# Patient Record
Sex: Male | Born: 1970
Health system: Southern US, Community
[De-identification: ages and names within clinical notes are randomized; demographics above are authoritative.]

## PROBLEM LIST (undated history)

## (undated) DIAGNOSIS — K219 Gastro-esophageal reflux disease without esophagitis: Secondary | ICD-10-CM

## (undated) DIAGNOSIS — E7800 Pure hypercholesterolemia, unspecified: Secondary | ICD-10-CM

## (undated) DIAGNOSIS — G4730 Sleep apnea, unspecified: Secondary | ICD-10-CM

## (undated) DIAGNOSIS — I509 Heart failure, unspecified: Secondary | ICD-10-CM

## (undated) DIAGNOSIS — E119 Type 2 diabetes mellitus without complications: Secondary | ICD-10-CM

## (undated) DIAGNOSIS — I2510 Atherosclerotic heart disease of native coronary artery without angina pectoris: Secondary | ICD-10-CM

## (undated) DIAGNOSIS — F419 Anxiety disorder, unspecified: Secondary | ICD-10-CM

## (undated) DIAGNOSIS — I10 Essential (primary) hypertension: Secondary | ICD-10-CM

## (undated) DIAGNOSIS — I219 Acute myocardial infarction, unspecified: Secondary | ICD-10-CM

## (undated) DIAGNOSIS — E663 Overweight: Secondary | ICD-10-CM

## (undated) HISTORY — DX: Pure hypercholesterolemia, unspecified: E78.00

## (undated) HISTORY — DX: Overweight: E66.3

## (undated) HISTORY — DX: Essential (primary) hypertension: I10

---

## 2008-06-25 ENCOUNTER — Inpatient Hospital Stay (HOSPITAL_COMMUNITY): Admission: EM | Admit: 2008-06-25 | Discharge: 2008-06-30 | Payer: Self-pay | Admitting: Emergency Medicine

## 2008-07-16 ENCOUNTER — Ambulatory Visit: Payer: Self-pay | Admitting: Family Medicine

## 2008-12-11 ENCOUNTER — Ambulatory Visit: Payer: Self-pay | Admitting: Family Medicine

## 2008-12-15 ENCOUNTER — Ambulatory Visit: Payer: Self-pay | Admitting: *Deleted

## 2009-04-09 ENCOUNTER — Ambulatory Visit: Payer: Self-pay | Admitting: Family Medicine

## 2009-08-10 ENCOUNTER — Ambulatory Visit: Payer: Self-pay | Admitting: Family Medicine

## 2010-04-29 ENCOUNTER — Ambulatory Visit: Payer: Self-pay | Admitting: Family Medicine

## 2010-04-29 ENCOUNTER — Ambulatory Visit (HOSPITAL_COMMUNITY): Admission: RE | Admit: 2010-04-29 | Discharge: 2010-04-29 | Payer: Self-pay | Admitting: Family Medicine

## 2010-04-29 LAB — CONVERTED CEMR LAB: Microalb, Ur: 1.32 mg/dL (ref 0.00–1.89)

## 2010-06-13 ENCOUNTER — Ambulatory Visit: Payer: Self-pay | Admitting: Family Medicine

## 2010-06-13 LAB — CONVERTED CEMR LAB
CO2: 29 meq/L (ref 19–32)
Cholesterol: 199 mg/dL (ref 0–200)
Creatinine, Ser: 1.06 mg/dL (ref 0.40–1.50)
Glucose, Bld: 275 mg/dL — ABNORMAL HIGH (ref 70–99)
Hgb A1c MFr Bld: 11.4 % — ABNORMAL HIGH (ref <5.7)
LDL Cholesterol: 123 mg/dL — ABNORMAL HIGH (ref 0–99)
Potassium: 4.5 meq/L (ref 3.5–5.3)
Sodium: 137 meq/L (ref 135–145)
Triglycerides: 233 mg/dL — ABNORMAL HIGH (ref <150)
VLDL: 47 mg/dL — ABNORMAL HIGH (ref 0–40)

## 2011-03-07 NOTE — Discharge Summary (Signed)
[name], [name] NO.:  [id number]   MEDICAL RECORD NO.:  [id number]          PATIENT TYPE:  INP   LOCATION:  5529                         FACILITY:  [facility]   PHYSICIAN:  Hillery Aldo, M.D.   DATE OF BIRTH:  [birth year]   DATE OF ADMISSION:  06/25/2008  DATE OF DISCHARGE:  06/30/2008                               DISCHARGE SUMMARY   PRIMARY CARE PHYSICIAN:  None.   The patient will be referred to [facility] for hospital followup.   DISCHARGE DIAGNOSES:  1. Methicillin-resistant Staphylococcus aureus, scrotal cellulitis and      abscess, status post incision and drainage.  2. Flu-like illness.  3. Diabetes mellitus.  4. Hypertension.  5. Obesity.  6. Small mildly complicated hydrocele, left testicle.   DISCHARGE MEDICATIONS:  1. Doxycycline 100 mg b.i.d. x10 days.  2. Glipizide 5 mg daily.  3. Glucophage 1000 mg b.i.d.  4. Lisinopril 10 mg daily.   CONSULTATIONS:  Ronald L. Earlene Plater, MD, [facility].   BRIEF ADMISSION HISTORY OF PRESENT ILLNESS:  The patient is a 40-year-  old male who presented to the hospital for evaluation of testicular  swelling on the left, recent cough and fever.  The patient's left  testicular swelling progressed to the point where he developed the  pustule and drained pus and subsequently followed by worsening fevers  and increasing swelling.  He was admitted for further evaluation and  initiation of IV antibiotics.  For the full details, please see the  dictated report done by Dr. Oralia Rud.   PROCEDURES AND DIAGNOSTIC STUDIES:  1. Chest x-ray on June 25, 2008, showed minimal linear scar in the      right upper lobe with no acute cardiopulmonary disease.  2. Scrotal ultrasound on June 25, 2008, showed normal-appearing      testes with no evidence of testicular torsion.  There was a mildly      small complicated hydrocele on the left.  There was associated      thickening of the scrotal walls.  The epididymis was  normal.   DISCHARGE LABORATORY VALUES:  Sodium was 138, potassium 4.3, chloride  102, bicarb 29, BUN 9, creatinine 1.15, glucose 146.  White blood cell  count was 15.2, hemoglobin 13.3, hematocrit 39.8, platelets 250.  Cultures of the pus draining from the scrotum grew MRSA.   HOSPITAL COURSE BY PROBLEM:  1. Left scrotal abscess/cellulitis:  The patient was admitted and      empirically put on broad-spectrum antibiotic therapy including      vancomycin and Zosyn.  Cultures were subsequently taken of the pus      and grew out MRSA.  At that time, his antibiotic coverage was      narrowed to vancomycin.  He has completed 4 days of therapy with IV      vancomycin and has experienced dramatic decrease in the pain,      swelling, and induration of the abscessed area.  He was seen and      followed in-house by the urologist with the initial incision and      drainage and followed  him clinically during course of his hospital      stay.  At this point, the sensitivities do reveal that the MRSA is      sensitive to doxycycline and Septra.  Because of cost constraints,      we are going to discharge him on doxycycline for an additional 10      days of therapy.  We will set him up to be seen at [facility] for      hospital followup and recommend antibacterial showers with Dial or      Lever 2000.  2. Flu-like illness:  The patient may have had H1N1 influenza prior to      admission.  He did have nasal swabs sent off for PCR for H1N1 that      are pending at the time of this dictation.  Because of the length      of time that he experienced these symptoms, it was not felt that      Tamiflu would provide any benefit.  3. Diabetes:  The patient had not been treating this, but had been on      metformin in the past.  Hemoglobin A1c was found to be 8.3%.      Subsequently started on metformin and the dose titrated up along      with Glucotrol, which was added due to inadequate glycemic control.       At this time, on dual therapy, his sugars have ranged from 91-130      over the last 24 hours indicating much better control.  We will      discharge him on this combination medications.  4. Transient diarrhea:  The patient did develop some transient      diarrhea.  C. difficile toxin studies were negative times 1.  He      was put on Florastor and at this time, his diarrhea has improved.   DISPOSITION:  The patient is medically stable for discharge and would be  discharged home.  He does not have insurance or local physician.  We  will obtain a care manager consultation for referral to [facility] for  hospital followup and for help with obtaining his prescription  medications.   TIME SPENT ON DISCHARGE PLANNING AND COORDINATION OF CARE:  Greater than  30 minutes.      Hillery Aldo, M.D.  Electronically Signed     CR/MEDQ  D:  06/30/2008  T:  06/30/2008  Job:  [id number]   cc:   Melvern Banker

## 2011-03-07 NOTE — H&P (Signed)
[name] Merritt, [name] Merritt NO.:  [id number]   MEDICAL RECORD NO.:  [id number]          PATIENT TYPE:  INP   LOCATION:  5529                         FACILITY:  [facility]   PHYSICIAN:  Darryl D. Prime, MD    DATE OF BIRTH:  1970/11/21   DATE OF ADMISSION:  06/25/2008  DATE OF DISCHARGE:                              HISTORY & PHYSICAL   The patient is full code.  He was the historian.  He is a good  historian.  He has no primary care physician.  The patient has recently  moved from [place], [place], within the last 6 months.   The patient's total visit time was approximately 62 minutes.   CHIEF COMPLAINT:  Testicle swollen, recent cough and fever.   HISTORY OF PRESENT ILLNESS:  [name] Merritt is a 40 year old male with a  history of diabetes, he notes diagnosed over the last year, was placed  on metformin but has not taken metformin for the last 6 months since  moving here, who notes initially a cough, nasal congestion, and chest  congestion for the last 3 weeks.  Cough has been dry but denies any  associated fevers but did feel hot at times.  The patient notes over the  last 2 days he had subjective fevers, sweats, and increased heart rate  with cold chills and then noticed a pimple around that time in the area  of the left scrotal skin.  On further recollection, the patient notes  the pimple actually has been present for the last 3 days but increased  in size over the last 2 days.  He notes yesterday his mom placed  fatback on it and it drained pus.  He stated he then developed  significant fevers, increased in swelling, and scrotal-area-associated  pain and decided to present to the emergency room.  In the emergency  room, the patient was evaluated by urologist and had an ultrasound of  the scrotal area.  He was also ordered for vancomycin and morphine.   PAST MEDICAL HISTORY AND PAST SURGICAL HISTORY:  1. He is status post vasectomy in 2001.  2. History of diabetes  since August of 2008.  3. History of hypertension.  4. No surgeries other than the vasectomy.   SOCIAL HISTORY:  No tobacco, alcohol, or illicit drug use ever.  He is  currently unemployed.   FAMILY HISTORY:  Positive for father who had diabetes.   ALLERGIES:  No known drug allergies.   MEDICATIONS:  He is on none at this time but was 6 months ago on  metformin and a blood pressure medication.   REVIEW OF SYSTEMS:  A 14-point review of systems negative unless stated  above.   PHYSICAL EXAMINATION:  VITAL SIGNS:  Temperature is 103.1 with a blood  pressure of 152/87, pulse of 118, respiratory rate of 24, and sats are  94% on room air.  GENERAL:  He is an extremely muscular and obese male lying flat in bed  in no acute distress.  He is on droplet precautions with a mask in  place.  HEENT:  Normocephalic and atraumatic.  His pupils are equal, round, and  reactive to light.  Extraocular movements are intact.  The oropharynx is  dry with no oropharyngeal lesions.  NECK:  Supple with no lymphadenopathy or thyromegaly.  No carotid  bruits.  No jugular venous distention.  CARDIOVASCULAR:  Regular rhythm with a fast rate, hyperdynamic.  No  murmurs.  EXTREMITIES:  No clubbing, cyanosis, or edema.  ABDOMEN:  Obese, soft, nontender, and nondistended with no  hepatosplenomegaly.  LUNGS:  Clear to auscultation bilaterally.  GU:  Scrotal area disclosed associated mild induration, redness, and  tenderness in the area of the left scrotum, the size of the erythema  over the skin in the range of 7 x 3 cm, although the borders are  diffuse.  There is no current drainage at this time.   DATA:  The patient had an ultrasound of the scrotum, which shows both  testes normal in size with normal blood flow.  No abnormal findings to  the epididymides as well, but he does have a left-sided hydrocele that  is complicated.  There is no definite scrotal abscess.   The patient's white blood cell count is  13.7 with a hemoglobin of 14.7,  platelets 270, and segs of 35%.  Sodium of 139 with a potassium of 3.9,  chloride 102, bicarb 26, BUN 13 with a creatinine of 1.5, and glucose  158.  No prior lab studies to compare.  Chest x-ray showed no acute  coronary pulmonary disease.   ASSESSMENT AND PLAN:  This is a 40 year old male with diabetes that is  not controlled because he is not on any medication who presents with a  scrotal pimple that has drained.  He has had associated fever.  He has  had recent flu-like symptoms, but it is less likely to be H1N1 as he did  not have associated fevers until 2 days ago, which seems to be more  associated with the scrotal pimple as opposed to any other findings or  history.  The patient is a diabetic, and that area is concerning for  staphylococcus infection, especially methicillin-resistant  Staphylococcus aureus.  The patient's white blood cell count is  elevated.  He has a high fever.   He will be admitted and we will continue vancomycin and Zosyn, and we  will get blood cultures x2.  Urology did see him and felt there was no  scrotal abscess, only a skin abscess associated with the scrotal skin  only, and they will follow him.  We will get a urinalysis.  He will be  given IV fluids for associated hypovolemia, and he will be given warm  compresses over that area.  For diabetes history, we will check a  hemoglobin A1c, and he is to be assigned a primary care physician on  discharge if possible.  He may benefit from being restarted on  metformin.  For his elevated creatinine, we will give IV fluids and  follow.  For his hypertension, we will give IV fluids and follow his  creatinine, and once it is stable we will possibly restart an  antihypertensive upon discharge.  GI prophylaxis will be Zantac, DVT  prophylaxis with enoxaparin.      Darryl D. Prime, MD  Electronically Signed     DDP/MEDQ  D:  06/25/2008  T:  06/26/2008  Job:  [id number]

## 2011-07-26 LAB — H1N1 SCREEN (PCR): H1N1 Virus Scrn: NOT DETECTED

## 2011-07-26 LAB — DIFFERENTIAL
Basophils Absolute: 0.1
Basophils Relative: 1
Eosinophils Absolute: 0
Eosinophils Relative: 0
Monocytes Absolute: 1.2 — ABNORMAL HIGH
Monocytes Relative: 9

## 2011-07-26 LAB — CBC
HCT: 36.7 — ABNORMAL LOW
HCT: 39.8
HCT: 44
Hemoglobin: 12.4 — ABNORMAL LOW
Hemoglobin: 13.3
MCHC: 33.3
MCHC: 33.4
MCV: 97
MCV: 97.4
MCV: 98
Platelets: 218
Platelets: 280
RBC: 3.81 — ABNORMAL LOW
RBC: 4.52
RDW: 13
WBC: 10.9 — ABNORMAL HIGH
WBC: 13.7 — ABNORMAL HIGH

## 2011-07-26 LAB — GLUCOSE, CAPILLARY
Glucose-Capillary: 119 — ABNORMAL HIGH
Glucose-Capillary: 130 — ABNORMAL HIGH
Glucose-Capillary: 133 — ABNORMAL HIGH
Glucose-Capillary: 140 — ABNORMAL HIGH
Glucose-Capillary: 156 — ABNORMAL HIGH
Glucose-Capillary: 162 — ABNORMAL HIGH
Glucose-Capillary: 169 — ABNORMAL HIGH
Glucose-Capillary: 169 — ABNORMAL HIGH
Glucose-Capillary: 177 — ABNORMAL HIGH
Glucose-Capillary: 184 — ABNORMAL HIGH
Glucose-Capillary: 99

## 2011-07-26 LAB — BASIC METABOLIC PANEL
BUN: 9
BUN: 9
CO2: 27
CO2: 28
Calcium: 9.1
Chloride: 102
Chloride: 104
Chloride: 105
Creatinine, Ser: 1.06
Creatinine, Ser: 1.15
GFR calc Af Amer: >60
GFR calc non Af Amer: >60
GFR calc non Af Amer: >60
Glucose, Bld: 146 — ABNORMAL HIGH
Glucose, Bld: 99
Potassium: 3.7
Potassium: 3.9
Potassium: 4.3
Sodium: 138

## 2011-07-26 LAB — POCT I-STAT, CHEM 8
BUN: 13
Glucose, Bld: 158 — ABNORMAL HIGH
HCT: 45
Hemoglobin: 15.3
Potassium: 3.9

## 2011-07-26 LAB — CULTURE, ROUTINE-ABSCESS

## 2011-07-26 LAB — URINALYSIS, ROUTINE W REFLEX MICROSCOPIC
Nitrite: NEGATIVE
Specific Gravity, Urine: 1.03
Urobilinogen, UA: 1
pH: 6

## 2011-07-26 LAB — CULTURE, BLOOD (ROUTINE X 2)
Culture: NO GROWTH
Culture: NO GROWTH

## 2011-07-26 LAB — HEMOGLOBIN A1C
Hgb A1c MFr Bld: 8.3 — ABNORMAL HIGH
Mean Plasma Glucose: 192

## 2011-07-26 LAB — CLOSTRIDIUM DIFFICILE EIA
C difficile Toxins A+B, EIA: NEGATIVE
C difficile Toxins A+B, EIA: NEGATIVE

## 2013-09-25 ENCOUNTER — Encounter (HOSPITAL_COMMUNITY): Payer: Self-pay | Admitting: Emergency Medicine

## 2013-09-25 ENCOUNTER — Emergency Department (HOSPITAL_COMMUNITY)
Admission: EM | Admit: 2013-09-25 | Discharge: 2013-09-25 | Disposition: A | Discharge status: Home / Self Care | Payer: Self-pay | Attending: Emergency Medicine | Admitting: Emergency Medicine

## 2013-09-25 DIAGNOSIS — K5900 Constipation, unspecified: Secondary | ICD-10-CM | POA: Insufficient documentation

## 2013-09-25 DIAGNOSIS — R739 Hyperglycemia, unspecified: Secondary | ICD-10-CM

## 2013-09-25 DIAGNOSIS — R631 Polydipsia: Secondary | ICD-10-CM | POA: Insufficient documentation

## 2013-09-25 DIAGNOSIS — E119 Type 2 diabetes mellitus without complications: Secondary | ICD-10-CM | POA: Insufficient documentation

## 2013-09-25 DIAGNOSIS — K6289 Other specified diseases of anus and rectum: Secondary | ICD-10-CM | POA: Insufficient documentation

## 2013-09-25 HISTORY — DX: Type 2 diabetes mellitus without complications: E11.9

## 2013-09-25 LAB — CBC
Hemoglobin: 15.7 g/dL (ref 13.0–17.0)
MCH: 32 pg (ref 26.0–34.0)
MCHC: 34.7 g/dL (ref 30.0–36.0)
MCV: 92.3 fL (ref 78.0–100.0)
RBC: 4.91 MIL/uL (ref 4.22–5.81)

## 2013-09-25 LAB — BASIC METABOLIC PANEL
BUN: 13 mg/dL (ref 6–23)
CO2: 25 mEq/L (ref 19–32)
Calcium: 9 mg/dL (ref 8.4–10.5)
Glucose, Bld: 555 mg/dL (ref 70–99)
Potassium: 4.3 mEq/L (ref 3.5–5.1)
Sodium: 133 mEq/L — ABNORMAL LOW (ref 135–145)

## 2013-09-25 LAB — GLUCOSE, CAPILLARY: Glucose-Capillary: 359 mg/dL — ABNORMAL HIGH (ref 70–99)

## 2013-09-25 LAB — URINALYSIS, ROUTINE W REFLEX MICROSCOPIC
Bilirubin Urine: NEGATIVE
Glucose, UA: >1000 mg/dL — AB
Hgb urine dipstick: NEGATIVE
Protein, ur: NEGATIVE mg/dL

## 2013-09-25 MED ORDER — SODIUM CHLORIDE 0.9 % IV BOLUS (SEPSIS)
1000.0000 mL | Freq: Once | INTRAVENOUS | Status: AC
  Administered 2013-09-25: 1000 mL via INTRAVENOUS

## 2013-09-25 MED ORDER — HYDROCODONE-ACETAMINOPHEN 5-325 MG PO TABS: ORAL_TABLET | ORAL | Status: DC

## 2013-09-25 MED ORDER — OXYCODONE-ACETAMINOPHEN 5-325 MG PO TABS
1.0000 | ORAL_TABLET | Freq: Once | ORAL | Status: AC
  Administered 2013-09-25: 1 via ORAL
  Filled 2013-09-25: qty 1

## 2013-09-25 MED ORDER — HYDROCORTISONE 2.5 % RE CREA: TOPICAL_CREAM | RECTAL | Status: DC

## 2013-09-25 MED ORDER — DOCUSATE SODIUM 100 MG PO CAPS: 100.0000 mg | ORAL_CAPSULE | Freq: Two times a day (BID) | ORAL | Status: DC

## 2013-09-25 MED ORDER — METFORMIN HCL 500 MG PO TABS: 500.0000 mg | ORAL_TABLET | Freq: Two times a day (BID) | ORAL | Status: DC

## 2013-09-25 NOTE — ED Notes (Signed)
Pt given d/c instructions and verbalized understanding. NAD at this time. VS are stable at this time.

## 2013-09-25 NOTE — ED Provider Notes (Signed)
Medical screening examination/treatment/procedure(s) were performed by non-physician practitioner and as supervising physician I was immediately available for consultation/collaboration.   Mireya Meditz, MD 09/25/13 2258 

## 2013-09-25 NOTE — ED Provider Notes (Signed)
CSN: [id number]     Arrival date & time 09/25/13  4098 History   First MD Initiated Contact with Patient 09/25/13 0310     Chief Complaint  Patient presents with  . Hemorrhoids   (Consider location/radiation/quality/duration/timing/severity/associated sxs/prior Treatment) HPI Comments: Patient with history of diabetes presents with complaint of rectal pain for the past one and one half weeks. Patient self diagnosed hemorrhoids. He is having pain with defecation, has noted a small amount of blood in stool. Patient states that he has not taken metformin in one year [facility]. He reports increased thirst and urination. No abdominal pain, vomiting, fever. No chills, dysuria. Patient has used over-the-counter preparation H., Tuff pads, sitz baths without much relief. He reports straining often times with bowel movements. The onset of this condition was acute. The course is constant.  The history is provided by the patient.    Past Medical History  Diagnosis Date  . Diabetes mellitus without complication    History reviewed. No pertinent past surgical history. No family history on file. History  Substance Use Topics  . Smoking status: Never Smoker   . Smokeless tobacco: Not on file  . Alcohol Use: No    Review of Systems  Constitutional: Negative for fever.  HENT: Negative for rhinorrhea and sore throat.   Eyes: Negative for redness.  Respiratory: Negative for cough.   Cardiovascular: Negative for chest pain.  Gastrointestinal: Positive for constipation, blood in stool and rectal pain. Negative for nausea, vomiting, abdominal pain and diarrhea.  Endocrine: Positive for polydipsia.  Genitourinary: Negative for dysuria and hematuria.  Musculoskeletal: Negative for myalgias.  Skin: Negative for rash.  Neurological: Negative for headaches.    Allergies  Review of patient's allergies indicates no known allergies.  Home Medications   Current Outpatient Rx  Name   Route  Sig  Dispense  Refill  . naproxen sodium (ANAPROX) 220 MG tablet   Oral   Take 660 mg by mouth daily as needed (for pain).          BP 179/107  Pulse 108  Temp(Src) 98.6 F (37 C) (Oral)  Resp 20  SpO2 96% Physical Exam  Nursing note and vitals reviewed. Constitutional: He appears well-developed and well-nourished.  HENT:  Head: Normocephalic and atraumatic.  Eyes: Conjunctivae are normal. Right eye exhibits no discharge. Left eye exhibits no discharge.  Neck: Normal range of motion. Neck supple.  Cardiovascular: Normal rate, regular rhythm and normal heart sounds.   Pulmonary/Chest: Effort normal and breath sounds normal.  Abdominal: Soft. There is no tenderness.  Genitourinary: Rectal exam shows external hemorrhoid (6 o'clock, pea sized) and tenderness (generalized). Rectal exam shows no internal hemorrhoid, no fissure, no mass and anal tone normal.  Neurological: He is alert.  Skin: Skin is warm and dry.  Psychiatric: He has a normal mood and affect.    ED Course  Procedures (including critical care time) Labs Review Labs Reviewed  GLUCOSE, CAPILLARY - Abnormal; Notable for the following:    Glucose-Capillary 489 (*)    All other components within normal limits  URINALYSIS, ROUTINE W REFLEX MICROSCOPIC - Abnormal; Notable for the following:    Glucose, UA >1000 (*)    All other components within normal limits  BASIC METABOLIC PANEL - Abnormal; Notable for the following:    Sodium 133 (*)    Glucose, Bld 555 (*)    All other components within normal limits  GLUCOSE, CAPILLARY - Abnormal; Notable for the following:  Glucose-Capillary 359 (*)    All other components within normal limits  CBC  URINE MICROSCOPIC-ADD ON   Imaging Review No results found.  EKG Interpretation   None      3:20 AM Patient seen and examined. Work-up initiated. Medications ordered. Rectal exam performed. Pt informed of elevated blood pressure.   Vital signs reviewed and are  as follows: Filed Vitals:   09/25/13 0237  BP: 179/107  Pulse: 108  Temp: 98.6 F (37 C)  Resp: 20   4:27 AM Hyperglycemia improved with fluids.   Patient will be discharged to home.   Patient urged to return with worsening symptoms or other concerns. Patient verbalized understanding and agrees with plan.   Patient counseled on use of narcotic pain medications. Counseled not to combine these medications with others containing tylenol. Urged not to drink alcohol, drive, or perform any other activities that requires focus while taking these medications. The patient verbalizes understanding and agrees with the plan.  BP 144/81  Pulse 72  Temp(Src) 98.6 F (37 C) (Oral)  Resp 16  SpO2 95%  MDM   1. Rectal pain   2. Hyperglycemia without ketosis    Rectal pain: Suspect hemorrhoid, fissure not identified on exam is possible given amount of tenderness. No systemic symptoms or signs of abscess or proctitis. He is uncontrolled diabetic. Will start on colace, anusol, continue sitz baths. Small amount of pain medicine given for severe symptoms -- patient knows this can cause constipation and is to continue colace to prevent this. PCP f/u given with [facility].   Hyperglycemia: Non-compliant with metformin x 1 year. No ketosis tonight. Treated with fluids. Will have patient restart metformin. Urged importance of this. Urged importance of f/u with [facility].   HTN: No end organ damage, f/u with [facility] for management. Part of HTN tonight may be pain, but he is likely to need restarted on HTN meds.     Renne Crigler, PA-C 09/25/13 [phone]

## 2013-09-25 NOTE — ED Notes (Signed)
Preston Fleeting, MD is aware of the pt's glucose level.

## 2013-09-25 NOTE — ED Notes (Signed)
C/o hemorrhoid pain x 1 1/2 weeks.  Pt states he is a diabetic but unable to get diabetes medication since clinic closed 1 yr ago.

## 2013-09-25 NOTE — ED Notes (Signed)
Pt believes that he has hemorrhoids, based on internet searches. Pt states that he has been having burning, itching, and spotting of blood from his rectum x 1 month. Pt states that he has tried over the counter hemorrhoid relief products with no relief.

## 2013-09-25 NOTE — ED Notes (Signed)
Diabetic pt without Metformin for over one year due to inability to afford and get prescribed. Denies headaches and blurred vision, denies dizziness.

## 2013-09-30 ENCOUNTER — Ambulatory Visit: Payer: Self-pay

## 2013-10-30 ENCOUNTER — Ambulatory Visit: Payer: Self-pay | Admitting: Internal Medicine

## 2015-09-17 ENCOUNTER — Emergency Department (HOSPITAL_COMMUNITY)
Admission: EM | Admit: 2015-09-17 | Discharge: 2015-09-17 | Disposition: A | Discharge status: Home / Self Care | Payer: No Typology Code available for payment source | Attending: Emergency Medicine | Admitting: Emergency Medicine

## 2015-09-17 ENCOUNTER — Encounter (HOSPITAL_COMMUNITY): Payer: Self-pay | Admitting: Emergency Medicine

## 2015-09-17 ENCOUNTER — Emergency Department (HOSPITAL_COMMUNITY): Payer: No Typology Code available for payment source

## 2015-09-17 DIAGNOSIS — M25521 Pain in right elbow: Secondary | ICD-10-CM | POA: Diagnosis present

## 2015-09-17 DIAGNOSIS — Z7952 Long term (current) use of systemic steroids: Secondary | ICD-10-CM | POA: Insufficient documentation

## 2015-09-17 DIAGNOSIS — Z79899 Other long term (current) drug therapy: Secondary | ICD-10-CM | POA: Diagnosis not present

## 2015-09-17 DIAGNOSIS — M7021 Olecranon bursitis, right elbow: Secondary | ICD-10-CM | POA: Insufficient documentation

## 2015-09-17 DIAGNOSIS — Y9389 Activity, other specified: Secondary | ICD-10-CM | POA: Diagnosis not present

## 2015-09-17 DIAGNOSIS — E119 Type 2 diabetes mellitus without complications: Secondary | ICD-10-CM | POA: Diagnosis not present

## 2015-09-17 DIAGNOSIS — Z7984 Long term (current) use of oral hypoglycemic drugs: Secondary | ICD-10-CM | POA: Diagnosis not present

## 2015-09-17 MED ORDER — CEPHALEXIN 250 MG PO CAPS
500.0000 mg | ORAL_CAPSULE | Freq: Once | ORAL | Status: AC
  Administered 2015-09-17: 500 mg via ORAL
  Filled 2015-09-17: qty 2

## 2015-09-17 MED ORDER — OXYCODONE-ACETAMINOPHEN 5-325 MG PO TABS
2.0000 | ORAL_TABLET | Freq: Once | ORAL | Status: AC
  Administered 2015-09-17: 2 via ORAL
  Filled 2015-09-17: qty 2

## 2015-09-17 MED ORDER — OXYCODONE-ACETAMINOPHEN 5-325 MG PO TABS: 1.0000 | ORAL_TABLET | Freq: Four times a day (QID) | ORAL | Status: DC | PRN

## 2015-09-17 MED ORDER — CEPHALEXIN 500 MG PO CAPS: 500.0000 mg | ORAL_CAPSULE | Freq: Four times a day (QID) | ORAL | Status: DC

## 2015-09-17 NOTE — ED Provider Notes (Signed)
CSN: [id number]     Arrival date & time 09/17/15  0359 History   First MD Initiated Contact with Patient 09/17/15 0431     Chief Complaint  Patient presents with  . Elbow Pain     (Consider location/radiation/quality/duration/timing/severity/associated sxs/prior Treatment) HPI Comments: 44 year old male with a history of diabetes mellitus presents to the emergency department for evaluation of right elbow pain. Patient states the pain began at 2300 yesterday. It has been constant and gradually worsening. No treatments tried prior to arrival. Pain is aggravated with movement of the right elbow. Patient denies any direct injury or trauma. He has no history of gout and denies frequent consumption of red meat or beer. Patient has had no fever or chills. No extremity numbness or weakness. He further denies nausea and vomiting. No history of similar symptoms.  Patient is a 44 y.o. male presenting with extremity pain. The history is provided by the patient. No language interpreter was used.  Extremity Pain This is a new problem. The current episode started today. The problem occurs constantly. The problem has been gradually worsening. Associated symptoms include arthralgias and joint swelling. Pertinent negatives include no fever, numbness or weakness. Exacerbated by: movement of the RUE/elbow. He has tried nothing for the symptoms. The treatment provided no relief.    Past Medical History  Diagnosis Date  . Diabetes mellitus without complication (HCC)    History reviewed. No pertinent past surgical history. No family history on file. Social History  Substance Use Topics  . Smoking status: Never Smoker   . Smokeless tobacco: None  . Alcohol Use: No    Review of Systems  Constitutional: Negative for fever.  Musculoskeletal: Positive for joint swelling and arthralgias.  Skin: Positive for color change. Negative for wound.  Neurological: Negative for weakness and numbness.  All other systems  reviewed and are negative.   Allergies  Review of patient's allergies indicates no known allergies.  Home Medications   Prior to Admission medications   Medication Sig Start Date End Date Taking? Authorizing Provider  docusate sodium (COLACE) 100 MG capsule Take 1 capsule (100 mg total) by mouth every 12 (twelve) hours. 09/25/13   Renne Crigler, PA-C  HYDROcodone-acetaminophen (NORCO/VICODIN) 5-325 MG per tablet Take 1-2 tablets every 6 hours as needed for severe pain 09/25/13   Renne Crigler, PA-C  hydrocortisone (ANUSOL-HC) 2.5 % rectal cream Apply rectally 2 times daily 09/25/13   Renne Crigler, PA-C  metFORMIN (GLUCOPHAGE) 500 MG tablet Take 1 tablet (500 mg total) by mouth 2 (two) times daily with a meal. 09/25/13   Renne Crigler, PA-C  naproxen sodium (ANAPROX) 220 MG tablet Take 660 mg by mouth daily as needed (for pain).    Historical Provider, MD   BP 161/104 mmHg  Pulse 93  Temp(Src) 98.6 F (37 C) (Oral)  Resp 18  Ht 6\' 2"  (1.88 m)  Wt 138.347 kg  BMI 39.14 kg/m2  SpO2 94%   Physical Exam  Constitutional: He is oriented to person, place, and time. He appears well-developed and well-nourished. No distress.  Nontoxic/nonseptic appearing  HENT:  Head: Normocephalic and atraumatic.  Eyes: Conjunctivae and EOM are normal. No scleral icterus.  Neck: Normal range of motion.  Cardiovascular: Normal rate, regular rhythm and intact distal pulses.   Distal radial pulse 2+ in the RUE  Pulmonary/Chest: Effort normal. No respiratory distress.  Respirations even and unlabored  Musculoskeletal: Normal range of motion.       Right elbow: He exhibits swelling (mild, over  olecranon). He exhibits normal range of motion (full AROM and PROM despite some pain with ROM.) and no effusion. Tenderness found. Olecranon process tenderness noted.       Right upper arm: Normal.       Right forearm: Normal.  Neurological: He is alert and oriented to person, place, and time. He exhibits normal muscle  tone. Coordination normal.  Sensation to light touch intact in the RUE. Grip strength 5/5 in the R hand.  Skin: Skin is warm and dry. No rash noted. He is not diaphoretic. No erythema. No pallor.  Psychiatric: He has a normal mood and affect. His behavior is normal.  Nursing note and vitals reviewed.   ED Course  Procedures (including critical care time) Labs Review Labs Reviewed - No data to display  Imaging Review Dg Elbow 2 Views Right  09/17/2015  CLINICAL DATA:  Acute onset of right elbow pain, swelling and erythema. Initial encounter. EXAM: RIGHT ELBOW - 2 VIEW COMPARISON:  None. FINDINGS: There is no evidence of fracture or dislocation. The visualized joint spaces are preserved. No significant joint effusion is identified. The soft tissues are unremarkable in appearance. IMPRESSION: No evidence of fracture or dislocation. Electronically Signed   By: Roanna Raider M.D.   On: 09/17/2015 04:51   I have personally reviewed and evaluated these images and lab results as part of my medical decision-making.   EKG Interpretation None      MDM   Final diagnoses:  Olecranon bursitis of right elbow    44 year old male presents to the emergency department for further evaluation of right elbow pain. Symptoms consistent with olecranon bursitis, mild. Patient is afebrile. He does have normal range of motion of his right upper extremity, though movement aggravates his pain. X-ray negative for acute findings. Patient started on Keflex in the emergency department. Will discharge with Keflex and Percocet for symptom management. Sling given for comfort and orthopedic referral provided. Patient instructed to follow-up with his primary care doctor in 3 days for recheck, with return to the ED if symptoms worsen. Return precautions given at discharge. Patient agreeable to plan with no unaddressed concerns. Patient discharged in good condition; VSS.   Filed Vitals:   09/17/15 0410 09/17/15 0415  BP:  170/105 161/104  Pulse: 93 93  Temp: 98.6 F (37 C)   TempSrc: Oral   Resp: 18   Height:  (1.88 m)   Weight: 138.347 kg   SpO2: 94% 94%     Antony Madura, PA-C 09/17/15 0543  Dione Booze, MD 09/17/15 [phone]

## 2015-09-17 NOTE — ED Notes (Signed)
Pt arrives POV c/o R elbow pain right at joint starting at 2300 yesterday. Denies injury.

## 2015-09-17 NOTE — Discharge Instructions (Signed)
Take ibuprofen or aleve for pain control and Percocet for severe pain as needed. Take Keflex for coverage of an infection. You may also try icing. Follow up with your primary doctor on Monday and an orthopedist as needed, especially if symptoms persist.  Elbow Bursitis Elbow bursitis is inflammation of the fluid-filled sac (bursa) between the tip of your elbow bone (olecranon) and your skin. Elbow bursitis may also be called olecranon bursitis. Normally, the olecranon bursa has only a small amount of fluid in it to cushion and protect your elbow bone. Elbow bursitis causes fluid to build up inside the bursa. Over time, this swelling and inflammation can cause pain when you bend or lean on your elbow.  CAUSES Elbow bursitis may be caused by:   Elbow injury (acute trauma).  Leaning on hard surfaces for long periods of time.  Infection from an injury that breaks the skin near your elbow.  A bone growth (spur) that forms at the tip of your elbow.  A medical condition that causes inflammation in your body, such as gout or rheumatoid arthritis.  The cause may also be unknown.  SIGNS AND SYMPTOMS  The first sign of elbow bursitis is usually swelling over the tip of your elbow. This can grow to be the size of a golf ball. This may start suddenly or develop gradually. You may also have:  Pain when bending or leaning on your elbow.  Restricted movement of your elbow.  If your bursitis is caused by an infection, symptoms may also include:  Redness, warmth, and tenderness of the elbow.  Drainage of pus from the swollen area over your elbow, if the skin breaks open. DIAGNOSIS  Your health care provider may be able to diagnose elbow bursitis based on your signs and symptoms, especially if you have recently been injured. Your health care provider will also do a physical exam. This may include:  X-rays to look for a bone spur or a bone fracture.  Draining fluid from the bursa to test it for  infection.  Blood tests to rule out gout or rheumatoid arthritis. TREATMENT  Treatment for elbow bursitis depends on the cause. Treatment may include:  Medicines. These may include:  Over-the-counter medicines to relieve pain and inflammation.  Antibiotic medicines to fight infection.  Injections of anti-inflammatory medicines (steroids).  Wrapping your elbow with a bandage.  Draining fluid from the bursa.  Wearing elbow pads.  If your bursitis does not get better with treatment, surgery may be needed to remove the bursa.  HOME CARE INSTRUCTIONS   Take medicines only as directed by your health care provider.  If you were prescribed an antibiotic medicine, finish all of it even if you start to feel better.  If your bursitis is caused by an injury, rest your elbow and wear your bandage as directed by your health care provider. You may alsoapply ice to the injured area as directed by your health care provider:  Put ice in a plastic bag.  Place a towel between your skin and the bag.  Leave the ice on for 20 minutes, 2-3 times per day.  Avoid any activities that cause elbow pain.  Use elbow pads or elbow wraps to cushion your elbow. SEEK MEDICAL CARE IF:  You have a fever.   Your symptoms do not get better with treatment.  Your pain or swelling gets worse.  Your elbow pain or swelling goes away and then returns.  You have drainage of pus from the swollen  area over your elbow.   This information is not intended to replace advice given to you by your health care provider. Make sure you discuss any questions you have with your health care provider.   Document Released: 11/08/2006 Document Revised: 10/30/2014 Document Reviewed: 06/17/2014 Elsevier Interactive Patient Education [employer].

## 2016-02-08 DIAGNOSIS — I10 Essential (primary) hypertension: Secondary | ICD-10-CM | POA: Diagnosis not present

## 2016-02-08 DIAGNOSIS — R5383 Other fatigue: Secondary | ICD-10-CM | POA: Diagnosis not present

## 2016-02-08 DIAGNOSIS — E7800 Pure hypercholesterolemia, unspecified: Secondary | ICD-10-CM | POA: Diagnosis not present

## 2016-02-08 DIAGNOSIS — E1165 Type 2 diabetes mellitus with hyperglycemia: Secondary | ICD-10-CM | POA: Diagnosis not present

## 2016-10-23 HISTORY — PX: OTHER SURGICAL HISTORY: SHX169

## 2016-10-26 DIAGNOSIS — Z23 Encounter for immunization: Secondary | ICD-10-CM | POA: Diagnosis not present

## 2016-10-26 DIAGNOSIS — E1165 Type 2 diabetes mellitus with hyperglycemia: Secondary | ICD-10-CM | POA: Diagnosis not present

## 2016-10-26 DIAGNOSIS — I10 Essential (primary) hypertension: Secondary | ICD-10-CM | POA: Diagnosis not present

## 2016-10-26 DIAGNOSIS — E7800 Pure hypercholesterolemia, unspecified: Secondary | ICD-10-CM | POA: Diagnosis not present

## 2016-10-26 DIAGNOSIS — Z0000 Encounter for general adult medical examination without abnormal findings: Secondary | ICD-10-CM | POA: Diagnosis not present

## 2017-01-04 DIAGNOSIS — Z113 Encounter for screening for infections with a predominantly sexual mode of transmission: Secondary | ICD-10-CM | POA: Diagnosis not present

## 2017-01-04 DIAGNOSIS — M25512 Pain in left shoulder: Secondary | ICD-10-CM | POA: Diagnosis not present

## 2017-01-05 ENCOUNTER — Other Ambulatory Visit: Payer: Self-pay | Admitting: Sports Medicine

## 2017-01-05 DIAGNOSIS — M25512 Pain in left shoulder: Secondary | ICD-10-CM

## 2017-01-05 DIAGNOSIS — M7582 Other shoulder lesions, left shoulder: Secondary | ICD-10-CM | POA: Diagnosis not present

## 2017-01-05 DIAGNOSIS — M542 Cervicalgia: Secondary | ICD-10-CM | POA: Diagnosis not present

## 2017-01-14 ENCOUNTER — Other Ambulatory Visit: Payer: No Typology Code available for payment source

## 2017-01-16 ENCOUNTER — Ambulatory Visit
Admission: RE | Admit: 2017-01-16 | Discharge: 2017-01-16 | Disposition: A | Discharge status: Home / Self Care | Payer: BLUE CROSS/BLUE SHIELD | Source: Ambulatory Visit | Attending: Sports Medicine | Admitting: Sports Medicine

## 2017-01-16 DIAGNOSIS — M25512 Pain in left shoulder: Secondary | ICD-10-CM

## 2017-01-16 DIAGNOSIS — M25511 Pain in right shoulder: Secondary | ICD-10-CM | POA: Diagnosis not present

## 2017-01-18 DIAGNOSIS — M7582 Other shoulder lesions, left shoulder: Secondary | ICD-10-CM | POA: Diagnosis not present

## 2017-01-22 DIAGNOSIS — M7582 Other shoulder lesions, left shoulder: Secondary | ICD-10-CM | POA: Diagnosis not present

## 2017-02-15 DIAGNOSIS — G8918 Other acute postprocedural pain: Secondary | ICD-10-CM | POA: Diagnosis not present

## 2017-02-15 DIAGNOSIS — M7542 Impingement syndrome of left shoulder: Secondary | ICD-10-CM | POA: Diagnosis not present

## 2017-02-15 DIAGNOSIS — M75122 Complete rotator cuff tear or rupture of left shoulder, not specified as traumatic: Secondary | ICD-10-CM | POA: Diagnosis not present

## 2017-02-15 DIAGNOSIS — M24112 Other articular cartilage disorders, left shoulder: Secondary | ICD-10-CM | POA: Diagnosis not present

## 2017-02-15 DIAGNOSIS — S46012A Strain of muscle(s) and tendon(s) of the rotator cuff of left shoulder, initial encounter: Secondary | ICD-10-CM | POA: Diagnosis not present

## 2017-03-02 DIAGNOSIS — M24112 Other articular cartilage disorders, left shoulder: Secondary | ICD-10-CM | POA: Diagnosis not present

## 2017-03-02 DIAGNOSIS — M7542 Impingement syndrome of left shoulder: Secondary | ICD-10-CM | POA: Diagnosis not present

## 2017-04-02 DIAGNOSIS — S46012D Strain of muscle(s) and tendon(s) of the rotator cuff of left shoulder, subsequent encounter: Secondary | ICD-10-CM | POA: Diagnosis not present

## 2017-04-04 DIAGNOSIS — M25612 Stiffness of left shoulder, not elsewhere classified: Secondary | ICD-10-CM | POA: Diagnosis not present

## 2017-04-04 DIAGNOSIS — M6281 Muscle weakness (generalized): Secondary | ICD-10-CM | POA: Diagnosis not present

## 2017-04-04 DIAGNOSIS — M25512 Pain in left shoulder: Secondary | ICD-10-CM | POA: Diagnosis not present

## 2017-04-04 DIAGNOSIS — M75122 Complete rotator cuff tear or rupture of left shoulder, not specified as traumatic: Secondary | ICD-10-CM | POA: Diagnosis not present

## 2017-04-05 DIAGNOSIS — M75122 Complete rotator cuff tear or rupture of left shoulder, not specified as traumatic: Secondary | ICD-10-CM | POA: Diagnosis not present

## 2017-04-05 DIAGNOSIS — M25612 Stiffness of left shoulder, not elsewhere classified: Secondary | ICD-10-CM | POA: Diagnosis not present

## 2017-04-05 DIAGNOSIS — M6281 Muscle weakness (generalized): Secondary | ICD-10-CM | POA: Diagnosis not present

## 2017-04-05 DIAGNOSIS — M25512 Pain in left shoulder: Secondary | ICD-10-CM | POA: Diagnosis not present

## 2017-04-16 DIAGNOSIS — M25612 Stiffness of left shoulder, not elsewhere classified: Secondary | ICD-10-CM | POA: Diagnosis not present

## 2017-04-16 DIAGNOSIS — M6281 Muscle weakness (generalized): Secondary | ICD-10-CM | POA: Diagnosis not present

## 2017-04-16 DIAGNOSIS — M25512 Pain in left shoulder: Secondary | ICD-10-CM | POA: Diagnosis not present

## 2017-04-16 DIAGNOSIS — M75122 Complete rotator cuff tear or rupture of left shoulder, not specified as traumatic: Secondary | ICD-10-CM | POA: Diagnosis not present

## 2017-04-18 DIAGNOSIS — M25612 Stiffness of left shoulder, not elsewhere classified: Secondary | ICD-10-CM | POA: Diagnosis not present

## 2017-04-18 DIAGNOSIS — M25512 Pain in left shoulder: Secondary | ICD-10-CM | POA: Diagnosis not present

## 2017-04-18 DIAGNOSIS — M6281 Muscle weakness (generalized): Secondary | ICD-10-CM | POA: Diagnosis not present

## 2017-04-18 DIAGNOSIS — M75122 Complete rotator cuff tear or rupture of left shoulder, not specified as traumatic: Secondary | ICD-10-CM | POA: Diagnosis not present

## 2017-04-26 DIAGNOSIS — M25512 Pain in left shoulder: Secondary | ICD-10-CM | POA: Diagnosis not present

## 2017-04-26 DIAGNOSIS — M6281 Muscle weakness (generalized): Secondary | ICD-10-CM | POA: Diagnosis not present

## 2017-04-26 DIAGNOSIS — M75122 Complete rotator cuff tear or rupture of left shoulder, not specified as traumatic: Secondary | ICD-10-CM | POA: Diagnosis not present

## 2017-04-26 DIAGNOSIS — M25612 Stiffness of left shoulder, not elsewhere classified: Secondary | ICD-10-CM | POA: Diagnosis not present

## 2017-04-30 DIAGNOSIS — M75122 Complete rotator cuff tear or rupture of left shoulder, not specified as traumatic: Secondary | ICD-10-CM | POA: Diagnosis not present

## 2017-04-30 DIAGNOSIS — M25612 Stiffness of left shoulder, not elsewhere classified: Secondary | ICD-10-CM | POA: Diagnosis not present

## 2017-04-30 DIAGNOSIS — M25512 Pain in left shoulder: Secondary | ICD-10-CM | POA: Diagnosis not present

## 2017-04-30 DIAGNOSIS — M6281 Muscle weakness (generalized): Secondary | ICD-10-CM | POA: Diagnosis not present

## 2017-05-03 DIAGNOSIS — M6281 Muscle weakness (generalized): Secondary | ICD-10-CM | POA: Diagnosis not present

## 2017-05-03 DIAGNOSIS — M25512 Pain in left shoulder: Secondary | ICD-10-CM | POA: Diagnosis not present

## 2017-05-03 DIAGNOSIS — M75122 Complete rotator cuff tear or rupture of left shoulder, not specified as traumatic: Secondary | ICD-10-CM | POA: Diagnosis not present

## 2017-05-03 DIAGNOSIS — M25612 Stiffness of left shoulder, not elsewhere classified: Secondary | ICD-10-CM | POA: Diagnosis not present

## 2017-05-07 DIAGNOSIS — M25512 Pain in left shoulder: Secondary | ICD-10-CM | POA: Diagnosis not present

## 2017-05-07 DIAGNOSIS — M25612 Stiffness of left shoulder, not elsewhere classified: Secondary | ICD-10-CM | POA: Diagnosis not present

## 2017-05-07 DIAGNOSIS — M6281 Muscle weakness (generalized): Secondary | ICD-10-CM | POA: Diagnosis not present

## 2017-05-07 DIAGNOSIS — M75122 Complete rotator cuff tear or rupture of left shoulder, not specified as traumatic: Secondary | ICD-10-CM | POA: Diagnosis not present

## 2017-06-06 ENCOUNTER — Ambulatory Visit (INDEPENDENT_AMBULATORY_CARE_PROVIDER_SITE_OTHER): Payer: BLUE CROSS/BLUE SHIELD | Admitting: Emergency Medicine

## 2017-06-06 ENCOUNTER — Encounter: Payer: Self-pay | Admitting: Emergency Medicine

## 2017-06-06 VITALS — BP 194/132 | HR 91 | Temp 98.7°F | Resp 16 | Ht 72.0 in | Wt 309.2 lb

## 2017-06-06 DIAGNOSIS — Z024 Encounter for examination for driving license: Secondary | ICD-10-CM

## 2017-06-06 DIAGNOSIS — E118 Type 2 diabetes mellitus with unspecified complications: Secondary | ICD-10-CM

## 2017-06-06 DIAGNOSIS — Z794 Long term (current) use of insulin: Secondary | ICD-10-CM

## 2017-06-06 DIAGNOSIS — I10 Essential (primary) hypertension: Secondary | ICD-10-CM

## 2017-06-06 NOTE — Progress Notes (Signed)
[name] Merritt 46 y.o.   Chief Complaint  Patient presents with  . dot pe    HISTORY OF PRESENT ILLNESS: This is a 46 y.o. 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  HPI   Prior to Admission medications   Medication Sig Start Date End Date Taking? Authorizing Provider  cephALEXin (KEFLEX) 500 MG capsule Take 1 capsule (500 mg total) by mouth 4 (four) times daily. 09/17/15   Antony Madura, PA-C  docusate sodium (COLACE) 100 MG capsule Take 1 capsule (100 mg total) by mouth every 12 (twelve) hours. 09/25/13   Renne Crigler, PA-C  HYDROcodone-acetaminophen (NORCO/VICODIN) 5-325 MG per tablet Take 1-2 tablets every 6 hours as needed for severe pain 09/25/13   Renne Crigler, PA-C  hydrocortisone (ANUSOL-HC) 2.5 % rectal cream Apply rectally 2 times daily 09/25/13   Renne Crigler, PA-C  metFORMIN (GLUCOPHAGE) 500 MG tablet Take 1 tablet (500 mg total) by mouth 2 (two) times daily with a meal. 09/25/13   Renne Crigler, PA-C  naproxen sodium (ANAPROX) 220 MG tablet Take 660 mg by mouth daily as needed (for pain).    [provider]  oxyCODONE-acetaminophen (PERCOCET/ROXICET) 5-325 MG tablet Take 1-2 tablets by mouth every 6 (six) hours as needed for severe pain. 09/17/15   Antony Madura, PA-C    No Known Allergies  There are no active problems to display for this patient.   Past Medical History:  Diagnosis Date  . Diabetes mellitus without complication (HCC)   . Hypertension     Past Surgical History:  Procedure Laterality Date  . torn rotator cuff  2018   2018    Social History   Social History  . Marital status: Single    Spouse name: N/A  . Number of children: N/A  . Years of education: N/A   Occupational History  . Not on file.   Social History Main Topics  . Smoking status: Never Smoker  . Smokeless tobacco: Former Neurosurgeon    Types: Chew  . Alcohol use No  . Drug use: No  . Sexual activity: Not on file   Other Topics Concern  .  Not on file   Social History Narrative  . No narrative on file    Family History  Problem Relation Age of Onset  . Hypertension Mother   . Diabetes Mother   . Hypertension Father   . Diabetes Father   . Diabetes Maternal Grandmother   . Hypertension Maternal Grandmother   . Diabetes Maternal Grandfather   . Hypertension Maternal Grandfather   . Diabetes Paternal Grandmother   . Hypertension Paternal Grandmother   . Diabetes Paternal Grandfather   . Hypertension Paternal Grandfather      ROS  Vitals:   06/06/17 1345 06/06/17 1414  BP: (!) 201/127 (!) 194/132  Pulse: 95 91  Resp: 16   Temp: 98.7 F (37.1 C)   SpO2: 96%     Physical Exam  Constitutional: He appears well-developed.  Morbidly obese.  Vitals reviewed.    ASSESSMENT & PLAN: [name] Merritt was seen today for dot pe.  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   Edwina Barth, MD [facility]

## 2017-06-06 NOTE — Patient Instructions (Signed)
     IF you received an x-ray today, you will receive an invoice from [facility]. Please contact  Radiology at [phone] with questions or concerns regarding your invoice.   IF you received labwork today, you will receive an invoice from [facility]. Please contact [facility] at [phone] with questions or concerns regarding your invoice.   Our billing staff will not be able to assist you with questions regarding bills from these companies.  You will be contacted with the lab results as soon as they are available. The fastest way to get your results is to activate your My Chart account. Instructions are located on the last page of this paperwork. If you have not heard from us regarding the results in 2 weeks, please contact this office.     

## 2017-06-11 DIAGNOSIS — E1165 Type 2 diabetes mellitus with hyperglycemia: Secondary | ICD-10-CM | POA: Diagnosis not present

## 2017-06-11 DIAGNOSIS — I10 Essential (primary) hypertension: Secondary | ICD-10-CM | POA: Diagnosis not present

## 2017-06-11 DIAGNOSIS — E663 Overweight: Secondary | ICD-10-CM | POA: Diagnosis not present

## 2017-06-11 DIAGNOSIS — E7800 Pure hypercholesterolemia, unspecified: Secondary | ICD-10-CM | POA: Diagnosis not present

## 2017-08-06 DIAGNOSIS — M25512 Pain in left shoulder: Secondary | ICD-10-CM | POA: Diagnosis not present

## 2017-08-13 DIAGNOSIS — E113513 Type 2 diabetes mellitus with proliferative diabetic retinopathy with macular edema, bilateral: Secondary | ICD-10-CM | POA: Diagnosis not present

## 2017-08-13 DIAGNOSIS — H25813 Combined forms of age-related cataract, bilateral: Secondary | ICD-10-CM | POA: Diagnosis not present

## 2017-08-17 ENCOUNTER — Encounter (INDEPENDENT_AMBULATORY_CARE_PROVIDER_SITE_OTHER): Payer: BLUE CROSS/BLUE SHIELD | Admitting: Ophthalmology

## 2017-08-17 NOTE — Progress Notes (Deleted)
[facility] - Clinic Note  08/20/2017     CHIEF COMPLAINT Patient presents for No chief complaint on file.   HISTORY OF PRESENT ILLNESS: [name] Merritt is a 46 y.o. male who presents to the clinic today for:     Referring physician: Sallye LatGroat, Christopher, MD [address] [place] [id number]  HISTORICAL INFORMATION:   Selected notes from the MEDICAL RECORD NUMBER Referral from Dr. Zetta Bills. Groat for concern of active PDR OU Ocular Hx- PDR OU; cataract OU; PMH- IDDM; HTN; hypercholesterolemia   CURRENT MEDICATIONS: No current outpatient prescriptions on file. (Ophthalmic Drugs)   No current facility-administered medications for this visit.  (Ophthalmic Drugs)   Current Outpatient Prescriptions (Other)  Medication Sig   cephALEXin (KEFLEX) 500 MG capsule Take 1 capsule (500 mg total) by mouth 4 (four) times daily.   docusate sodium (COLACE) 100 MG capsule Take 1 capsule (100 mg total) by mouth every 12 (twelve) hours.   HYDROcodone-acetaminophen (NORCO/VICODIN) 5-325 MG per tablet Take 1-2 tablets every 6 hours as needed for severe pain   hydrocortisone (ANUSOL-HC) 2.5 % rectal cream Apply rectally 2 times daily   metFORMIN (GLUCOPHAGE) 500 MG tablet Take 1 tablet (500 mg total) by mouth 2 (two) times daily with a meal.   naproxen sodium (ANAPROX) 220 MG tablet Take 660 mg by mouth daily as needed (for pain).   oxyCODONE-acetaminophen (PERCOCET/ROXICET) 5-325 MG tablet Take 1-2 tablets by mouth every 6 (six) hours as needed for severe pain.   No current facility-administered medications for this visit.  (Other)      REVIEW OF SYSTEMS:    ALLERGIES No Known Allergies  PAST MEDICAL HISTORY Past Medical History:  Diagnosis Date   Diabetes mellitus without complication (HCC)    Hypertension    Past Surgical History:  Procedure Laterality Date   torn rotator cuff  2018   2018    FAMILY HISTORY Family History  Problem  Relation Age of Onset   Hypertension Mother    Diabetes Mother    Hypertension Father    Diabetes Father    Diabetes Maternal Grandmother    Hypertension Maternal Grandmother    Diabetes Maternal Grandfather    Hypertension Maternal Grandfather    Diabetes Paternal Grandmother    Hypertension Paternal Grandmother    Diabetes Paternal Grandfather    Hypertension Paternal Grandfather     SOCIAL HISTORY Social History  Substance Use Topics   Smoking status: Never Smoker   Smokeless tobacco: Former NeurosurgeonUser    Types: Chew   Alcohol use No         OPHTHALMIC EXAM:   Not recorded      IMAGING AND PROCEDURES  Imaging and Procedures for 08/17/17           ASSESSMENT/PLAN:    ICD-10-CM   1. Retinal edema H35.81 OCT, Retina - OU - Both Eyes    1.  2.  3.  Ophthalmic Meds Ordered this visit:  No orders of the defined types were placed in this encounter.      No Follow-up on file.  There are no Patient Instructions on file for this visit.   Explained the diagnoses, plan, and follow up with the patient and they expressed understanding.  Patient expressed understanding of the importance of proper follow up care.   Karie ChimeraBrian G. Zamora, M.D., Ph.D. Diseases & Surgery of the Retina and Vitreous [facility] 08/17/17     Abbreviations:  M myopia (nearsighted); A astigmatism; H hyperopia (farsighted); P presbyopia; Mrx spectacle prescription;  CTL contact lenses; OD right eye; OS left eye; OU both eyes  XT exotropia; ET esotropia; PEK punctate epithelial keratitis; PEE punctate epithelial erosions; DES dry eye syndrome; MGD meibomian gland dysfunction; ATs artificial tears; PFAT's preservative free artificial tears; NSC nuclear sclerotic cataract; PSC posterior subcapsular cataract; ERM epi-retinal membrane; PVD posterior vitreous detachment; RD retinal detachment; DM diabetes mellitus; DR diabetic retinopathy; NPDR  non-proliferative diabetic retinopathy; PDR proliferative diabetic retinopathy; CSME clinically significant macular edema; DME diabetic macular edema; dbh dot blot hemorrhages; CWS cotton wool spot; POAG primary open angle glaucoma; C/D cup-to-disc ratio; HVF humphrey visual field; GVF goldmann visual field; OCT optical coherence tomography; IOP intraocular pressure; BRVO Branch retinal vein occlusion; CRVO central retinal vein occlusion; CRAO central retinal artery occlusion; BRAO branch retinal artery occlusion; RT retinal tear; SB scleral buckle; PPV pars plana vitrectomy; VH Vitreous hemorrhage; PRP panretinal laser photocoagulation; IVK intravitreal kenalog; VMT vitreomacular traction; MH Macular hole;  NVD neovascularization of the disc; NVE neovascularization elsewhere; AREDS age related eye disease study; ARMD age related macular degeneration; POAG primary open angle glaucoma; EBMD epithelial/anterior basement membrane dystrophy; ACIOL anterior chamber intraocular lens; IOL intraocular lens; PCIOL posterior chamber intraocular lens; Phaco/IOL phacoemulsification with intraocular lens placement; PRK photorefractive keratectomy; LASIK laser assisted in situ keratomileusis; HTN hypertension; DM diabetes mellitus; COPD chronic obstructive pulmonary disease

## 2017-08-20 ENCOUNTER — Encounter (INDEPENDENT_AMBULATORY_CARE_PROVIDER_SITE_OTHER): Payer: BLUE CROSS/BLUE SHIELD | Admitting: Ophthalmology

## 2017-10-12 DIAGNOSIS — E1165 Type 2 diabetes mellitus with hyperglycemia: Secondary | ICD-10-CM | POA: Diagnosis not present

## 2017-10-12 DIAGNOSIS — I10 Essential (primary) hypertension: Secondary | ICD-10-CM | POA: Diagnosis not present

## 2017-10-12 DIAGNOSIS — Z23 Encounter for immunization: Secondary | ICD-10-CM | POA: Diagnosis not present

## 2017-10-12 DIAGNOSIS — E663 Overweight: Secondary | ICD-10-CM | POA: Diagnosis not present

## 2017-10-12 DIAGNOSIS — E7800 Pure hypercholesterolemia, unspecified: Secondary | ICD-10-CM | POA: Diagnosis not present

## 2018-02-11 ENCOUNTER — Ambulatory Visit
Admission: RE | Admit: 2018-02-11 | Discharge: 2018-02-11 | Disposition: A | Discharge status: Home / Self Care | Payer: BLUE CROSS/BLUE SHIELD | Source: Ambulatory Visit | Attending: Internal Medicine | Admitting: Internal Medicine

## 2018-02-11 ENCOUNTER — Other Ambulatory Visit: Payer: Self-pay | Admitting: Internal Medicine

## 2018-02-11 DIAGNOSIS — R1032 Left lower quadrant pain: Secondary | ICD-10-CM

## 2018-02-11 DIAGNOSIS — E7800 Pure hypercholesterolemia, unspecified: Secondary | ICD-10-CM | POA: Diagnosis not present

## 2018-02-11 DIAGNOSIS — R109 Unspecified abdominal pain: Secondary | ICD-10-CM | POA: Diagnosis not present

## 2018-02-11 DIAGNOSIS — E1165 Type 2 diabetes mellitus with hyperglycemia: Secondary | ICD-10-CM | POA: Diagnosis not present

## 2018-02-11 DIAGNOSIS — I10 Essential (primary) hypertension: Secondary | ICD-10-CM | POA: Diagnosis not present

## 2018-02-22 ENCOUNTER — Other Ambulatory Visit: Payer: Self-pay | Admitting: Internal Medicine

## 2018-02-22 DIAGNOSIS — M549 Dorsalgia, unspecified: Secondary | ICD-10-CM | POA: Diagnosis not present

## 2018-02-22 DIAGNOSIS — R109 Unspecified abdominal pain: Secondary | ICD-10-CM | POA: Diagnosis not present

## 2018-02-22 DIAGNOSIS — R1084 Generalized abdominal pain: Secondary | ICD-10-CM

## 2018-02-28 ENCOUNTER — Inpatient Hospital Stay
Admission: RE | Admit: 2018-02-28 | Discharge: 2018-02-28 | Disposition: A | Discharge status: Home / Self Care | Payer: BLUE CROSS/BLUE SHIELD | Source: Ambulatory Visit | Attending: Internal Medicine | Admitting: Internal Medicine

## 2018-06-12 DIAGNOSIS — B353 Tinea pedis: Secondary | ICD-10-CM | POA: Diagnosis not present

## 2018-06-12 DIAGNOSIS — E7800 Pure hypercholesterolemia, unspecified: Secondary | ICD-10-CM | POA: Diagnosis not present

## 2018-06-12 DIAGNOSIS — I10 Essential (primary) hypertension: Secondary | ICD-10-CM | POA: Diagnosis not present

## 2018-06-12 DIAGNOSIS — E1165 Type 2 diabetes mellitus with hyperglycemia: Secondary | ICD-10-CM | POA: Diagnosis not present

## 2018-10-07 ENCOUNTER — Other Ambulatory Visit: Payer: Self-pay | Admitting: Internal Medicine

## 2018-10-07 ENCOUNTER — Ambulatory Visit
Admission: RE | Admit: 2018-10-07 | Discharge: 2018-10-07 | Disposition: A | Discharge status: Home / Self Care | Payer: BLUE CROSS/BLUE SHIELD | Source: Ambulatory Visit | Attending: Internal Medicine | Admitting: Internal Medicine

## 2018-10-07 DIAGNOSIS — Z23 Encounter for immunization: Secondary | ICD-10-CM | POA: Diagnosis not present

## 2018-10-07 DIAGNOSIS — R05 Cough: Secondary | ICD-10-CM | POA: Diagnosis not present

## 2018-10-07 DIAGNOSIS — E7800 Pure hypercholesterolemia, unspecified: Secondary | ICD-10-CM | POA: Diagnosis not present

## 2018-10-07 DIAGNOSIS — E1169 Type 2 diabetes mellitus with other specified complication: Secondary | ICD-10-CM | POA: Diagnosis not present

## 2018-10-07 DIAGNOSIS — R0600 Dyspnea, unspecified: Secondary | ICD-10-CM

## 2018-10-07 DIAGNOSIS — I10 Essential (primary) hypertension: Secondary | ICD-10-CM | POA: Diagnosis not present

## 2018-11-07 ENCOUNTER — Encounter: Payer: Self-pay | Admitting: Interventional Cardiology

## 2018-11-24 ENCOUNTER — Telehealth: Payer: Self-pay

## 2018-11-24 DIAGNOSIS — I209 Angina pectoris, unspecified: Secondary | ICD-10-CM | POA: Insufficient documentation

## 2018-11-24 NOTE — Progress Notes (Deleted)
Cardiology Office Note:    Date:  11/24/2018   ID:  [name] Merritt, DOB [birth year], MRN [id number]  PCP:  Renford Dills, MD  Cardiologist:  No primary care provider on file.   Referring MD: Renford Dills, MD   No chief complaint on file.   History of Present Illness:    [name] Merritt is a 48 y.o. male with a hx of diabetes mellitus II, hyperlipidemia, and essential hypertension referred for evaluation/consultation for angina pectoris by Dr. Renford Dills.  Past Medical History:  Diagnosis Date  . Diabetes mellitus without complication (HCC)   . High cholesterol   . Hypertension   . Overweight     Past Surgical History:  Procedure Laterality Date  . torn rotator cuff  2018   2018    Current Medications: No outpatient medications have been marked as taking for the 11/25/18 encounter (Appointment) with Lyn Records, MD.     Allergies:   Rosuvastatin calcium   Social History   Socioeconomic History  . Marital status: Single    Spouse name: Not on file  . Number of children: Not on file  . Years of education: Not on file  . Highest education level: Not on file  Occupational History  . Not on file  Social Needs  . Financial resource strain: Not on file  . Food insecurity:    Worry: Not on file    Inability: Not on file  . Transportation needs:    Medical: Not on file    Non-medical: Not on file  Tobacco Use  . Smoking status: Never Smoker  . Smokeless tobacco: Former Neurosurgeon    Types: Chew  Substance and Sexual Activity  . Alcohol use: No  . Drug use: No  . Sexual activity: Not on file  Lifestyle  . Physical activity:    Days per week: Not on file    Minutes per session: Not on file  . Stress: Not on file  Relationships  . Social connections:    Talks on phone: Not on file    Gets together: Not on file    Attends religious service: Not on file    Active member of club or organization: Not on file    Attends meetings of clubs or organizations: Not  on file    Relationship status: Not on file  Other Topics Concern  . Not on file  Social History Narrative  . Not on file     Family History: The patient's family history includes Diabetes in his father, maternal grandfather, maternal grandmother, mother, paternal grandfather, and paternal grandmother; Hypertension in his father, maternal grandfather, maternal grandmother, mother, paternal grandfather, and paternal grandmother.  ROS:   Please see the history of present illness.    *** All other systems reviewed and are negative.  EKGs/Labs/Other Studies Reviewed:    The following studies were reviewed today: ***  EKG:  EKG ***  Recent Labs: No results found for requested labs within last 8760 hours.  Recent Lipid Panel    Component Value Date/Time   CHOL 199 06/13/2010 1954   TRIG 233 (H) 06/13/2010 1954   HDL 29 (L) 06/13/2010 1954   CHOLHDL 6.9 Ratio 06/13/2010 1954   VLDL 47 (H) 06/13/2010 1954   LDLCALC 123 (H) 06/13/2010 1954    Physical Exam:    VS:  There were no vitals taken for this visit.    Wt Readings from Last 3 Encounters:  06/06/17 (!) 309 lb 3.2 oz (140.3 kg)  09/17/15 (!) 305 lb (138.3 kg)     GEN: ***. No acute distress HEENT: Normal NECK: No JVD. LYMPHATICS: No lymphadenopathy CARDIAC: ***RRR.  *** murmur, ***gallop, ***edema VASCULAR: *** Pulses, *** bruits RESPIRATORY:  Clear to auscultation without rales, wheezing or rhonchi  ABDOMEN: Soft, non-tender, non-distended, No pulsatile mass, MUSCULOSKELETAL: No deformity  SKIN: Warm and dry NEUROLOGIC:  Alert and oriented x 3 PSYCHIATRIC:  Normal affect   ASSESSMENT:    1. Angina pectoris (HCC)   2. Type 2 diabetes mellitus without complication, without long-term current use of insulin (HCC)   3. Essential hypertension   4. Hyperlipidemia LDL goal <70    PLAN:    In order of problems listed above:  1. ***   Medication Adjustments/Labs and Tests Ordered: Current medicines are  reviewed at length with the patient today.  Concerns regarding medicines are outlined above.  No orders of the defined types were placed in this encounter.  No orders of the defined types were placed in this encounter.   There are no Patient Instructions on file for this visit.   Signed, Lesleigh Noe, MD  11/24/2018 4:37 PM    [facility]

## 2018-11-24 NOTE — Telephone Encounter (Signed)
Patient meets inclusion criteria for current pharmacy residency project to initiate SGLT2i or GLP1-RA therapy for cardiovascular benefit due to current diagnosis of ASCVD and DM.  Patient has scheduled appointment with Dr. Katrinka Blazing on 11/25/2018.  Pharmacist will discuss initiation of Jardiance 10mg  or Victoza titrated to 1.8mg  and enrollment into study with patient and provider at upcoming office visit.   Relevant labs and dates: Lab Results  Component Value Date   HGBA1C 11.4 (H) 06/13/2010  HGBA1C 9.8 on 10/07/18 per [facility] labs   Lab Results  Component Value Date   CREATININE 0.93 09/25/2013   CREATININE 1.06 06/13/2010   CREATININE 1.06 06/29/2008   Creatinine 1.03 on 10/07/18 per [facility] labs   CrCl cannot be calculated (Patient's most recent lab result is older than the maximum 21 days allowed.). Wt Readings from Last 1 Encounters:  06/06/17 (!) 309 lb 3.2 oz (140.3 kg)   BP Readings from Last 1 Encounters:  06/06/17 (!) 194/132    Metformin use: Evette Cristal D PGY1 Pharmacy Resident  Phone [phone] Please use [facility] for clinical pharmacists numbers  11/24/2018      8:49 PM

## 2018-11-25 ENCOUNTER — Ambulatory Visit: Payer: BLUE CROSS/BLUE SHIELD | Admitting: Interventional Cardiology

## 2018-11-26 ENCOUNTER — Telehealth: Payer: Self-pay

## 2018-11-26 ENCOUNTER — Encounter: Payer: Self-pay | Admitting: Interventional Cardiology

## 2018-11-26 NOTE — Telephone Encounter (Signed)
Sent referral to scheduling and file notes 

## 2019-06-27 ENCOUNTER — Other Ambulatory Visit: Payer: Self-pay | Admitting: Orthopedic Surgery

## 2019-06-27 DIAGNOSIS — M25511 Pain in right shoulder: Secondary | ICD-10-CM

## 2019-07-24 ENCOUNTER — Ambulatory Visit
Admission: RE | Admit: 2019-07-24 | Discharge: 2019-07-24 | Disposition: A | Discharge status: Home / Self Care | Payer: Self-pay | Source: Ambulatory Visit | Attending: Orthopedic Surgery | Admitting: Orthopedic Surgery

## 2019-07-24 ENCOUNTER — Other Ambulatory Visit: Payer: Self-pay | Admitting: Orthopedic Surgery

## 2019-07-24 ENCOUNTER — Other Ambulatory Visit: Payer: Self-pay

## 2019-07-24 DIAGNOSIS — M25511 Pain in right shoulder: Secondary | ICD-10-CM

## 2019-07-25 ENCOUNTER — Other Ambulatory Visit: Payer: 59

## 2019-08-16 ENCOUNTER — Ambulatory Visit
Admission: RE | Admit: 2019-08-16 | Discharge: 2019-08-16 | Disposition: A | Discharge status: Home / Self Care | Payer: 59 | Source: Ambulatory Visit | Attending: Orthopedic Surgery | Admitting: Orthopedic Surgery

## 2019-08-16 ENCOUNTER — Other Ambulatory Visit: Payer: Self-pay

## 2019-08-16 DIAGNOSIS — M25511 Pain in right shoulder: Secondary | ICD-10-CM

## 2019-10-30 ENCOUNTER — Ambulatory Visit: Payer: 59 | Attending: Internal Medicine

## 2019-10-30 DIAGNOSIS — Z20822 Contact with and (suspected) exposure to covid-19: Secondary | ICD-10-CM

## 2019-11-01 LAB — NOVEL CORONAVIRUS, NAA: SARS-CoV-2, NAA: NOT DETECTED

## 2019-12-12 LAB — MICROALBUMIN, URINE: Microalb, Ur: 13.46

## 2019-12-12 LAB — HEMOGLOBIN A1C: Hemoglobin A1C: 9.6

## 2019-12-31 ENCOUNTER — Other Ambulatory Visit: Payer: Self-pay

## 2019-12-31 ENCOUNTER — Emergency Department (HOSPITAL_COMMUNITY)
Admission: EM | Admit: 2019-12-31 | Discharge: 2020-01-01 | Disposition: A | Discharge status: Home / Self Care | Payer: 59 | Attending: Emergency Medicine | Admitting: Emergency Medicine

## 2019-12-31 ENCOUNTER — Encounter (HOSPITAL_COMMUNITY): Payer: Self-pay | Admitting: Emergency Medicine

## 2019-12-31 ENCOUNTER — Emergency Department (HOSPITAL_COMMUNITY): Payer: 59

## 2019-12-31 DIAGNOSIS — I10 Essential (primary) hypertension: Secondary | ICD-10-CM | POA: Diagnosis not present

## 2019-12-31 DIAGNOSIS — E119 Type 2 diabetes mellitus without complications: Secondary | ICD-10-CM | POA: Diagnosis not present

## 2019-12-31 DIAGNOSIS — Y929 Unspecified place or not applicable: Secondary | ICD-10-CM | POA: Insufficient documentation

## 2019-12-31 DIAGNOSIS — Y93I9 Activity, other involving external motion: Secondary | ICD-10-CM | POA: Diagnosis not present

## 2019-12-31 DIAGNOSIS — R0789 Other chest pain: Secondary | ICD-10-CM | POA: Insufficient documentation

## 2019-12-31 DIAGNOSIS — Z20822 Contact with and (suspected) exposure to covid-19: Secondary | ICD-10-CM | POA: Insufficient documentation

## 2019-12-31 DIAGNOSIS — R109 Unspecified abdominal pain: Secondary | ICD-10-CM | POA: Insufficient documentation

## 2019-12-31 DIAGNOSIS — Y999 Unspecified external cause status: Secondary | ICD-10-CM | POA: Diagnosis not present

## 2019-12-31 DIAGNOSIS — R52 Pain, unspecified: Secondary | ICD-10-CM

## 2019-12-31 DIAGNOSIS — Z7984 Long term (current) use of oral hypoglycemic drugs: Secondary | ICD-10-CM | POA: Insufficient documentation

## 2019-12-31 DIAGNOSIS — Z79899 Other long term (current) drug therapy: Secondary | ICD-10-CM | POA: Insufficient documentation

## 2019-12-31 MED ORDER — METHOCARBAMOL 500 MG PO TABS
1000.0000 mg | ORAL_TABLET | ORAL | Status: AC
  Administered 2020-01-01: 1000 mg via ORAL
  Filled 2019-12-31: qty 2

## 2019-12-31 MED ORDER — ACETAMINOPHEN 500 MG PO TABS
1000.0000 mg | ORAL_TABLET | Freq: Once | ORAL | Status: AC
  Administered 2020-01-01: 1000 mg via ORAL
  Filled 2019-12-31: qty 2

## 2019-12-31 MED ORDER — LIDOCAINE 5 % EX PTCH
3.0000 | MEDICATED_PATCH | CUTANEOUS | Status: DC
  Administered 2020-01-01: 1 via TRANSDERMAL
  Filled 2019-12-31: qty 3

## 2019-12-31 MED ORDER — NAPROXEN 500 MG PO TABS
500.0000 mg | ORAL_TABLET | ORAL | Status: AC
  Administered 2020-01-01: 500 mg via ORAL
  Filled 2019-12-31: qty 1
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
## 2020-01-01 ENCOUNTER — Encounter (HOSPITAL_COMMUNITY): Payer: Self-pay | Admitting: Emergency Medicine

## 2020-01-01 ENCOUNTER — Emergency Department (HOSPITAL_COMMUNITY): Payer: 59

## 2020-01-01 LAB — I-STAT CHEM 8, ED
BUN: 17 mg/dL (ref 6–20)
Calcium, Ion: 1.15 mmol/L (ref 1.15–1.40)
Chloride: 104 mmol/L (ref 98–111)
Creatinine, Ser: 1.1 mg/dL (ref 0.61–1.24)
Glucose, Bld: 259 mg/dL — ABNORMAL HIGH (ref 70–99)
HCT: 43 % (ref 39.0–52.0)
Hemoglobin: 14.6 g/dL (ref 13.0–17.0)
Potassium: 4.1 mmol/L (ref 3.5–5.1)
Sodium: 139 mmol/L (ref 135–145)
TCO2: 27 mmol/L (ref 22–32)

## 2020-01-01 LAB — SARS CORONAVIRUS 2 (TAT 6-24 HRS): SARS Coronavirus 2: NEGATIVE

## 2020-01-01 MED ORDER — IOHEXOL 350 MG/ML SOLN
100.0000 mL | Freq: Once | INTRAVENOUS | Status: AC | PRN
  Administered 2020-01-01: 100 mL via INTRAVENOUS

## 2020-01-01 MED ORDER — DICLOFENAC SODIUM ER 100 MG PO TB24: 100.0000 mg | ORAL_TABLET | Freq: Every day | ORAL | 0 refills | Status: DC

## 2020-01-01 MED ORDER — KETOROLAC TROMETHAMINE 30 MG/ML IJ SOLN: 30.0000 mg | Freq: Once | INTRAMUSCULAR | Status: DC

## 2020-01-01 MED ORDER — METHOCARBAMOL 500 MG PO TABS: 500.0000 mg | ORAL_TABLET | Freq: Two times a day (BID) | ORAL | 0 refills | Status: DC

## 2020-01-01 MED ORDER — SODIUM CHLORIDE (PF) 0.9 % IJ SOLN
INTRAMUSCULAR | Status: AC
  Filled 2020-01-01: qty 50
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
## 2020-01-05 ENCOUNTER — Encounter (HOSPITAL_COMMUNITY): Payer: Self-pay

## 2020-02-17 ENCOUNTER — Ambulatory Visit: Payer: 59 | Admitting: Internal Medicine

## 2020-02-17 ENCOUNTER — Other Ambulatory Visit: Payer: Self-pay | Admitting: Internal Medicine

## 2020-02-17 ENCOUNTER — Other Ambulatory Visit: Payer: Self-pay

## 2020-02-17 ENCOUNTER — Encounter: Payer: Self-pay | Admitting: *Deleted

## 2020-02-17 ENCOUNTER — Encounter: Payer: Self-pay | Admitting: Internal Medicine

## 2020-02-17 VITALS — BP 160/100 | HR 96 | Temp 97.7°F | Ht 73.0 in | Wt 320.8 lb

## 2020-02-17 DIAGNOSIS — R05 Cough: Secondary | ICD-10-CM | POA: Diagnosis not present

## 2020-02-17 DIAGNOSIS — R0602 Shortness of breath: Secondary | ICD-10-CM

## 2020-02-17 DIAGNOSIS — K219 Gastro-esophageal reflux disease without esophagitis: Secondary | ICD-10-CM | POA: Diagnosis not present

## 2020-02-17 DIAGNOSIS — R053 Chronic cough: Secondary | ICD-10-CM

## 2020-02-17 MED ORDER — FLUTICASONE PROPIONATE 50 MCG/ACT NA SUSP: 1.0000 | Freq: Every day | NASAL | 2 refills | Status: DC

## 2020-02-17 MED ORDER — ALBUTEROL SULFATE HFA 108 (90 BASE) MCG/ACT IN AERS: 2.0000 | INHALATION_SPRAY | Freq: Four times a day (QID) | RESPIRATORY_TRACT | 3 refills | Status: DC | PRN

## 2020-02-17 MED ORDER — PANTOPRAZOLE SODIUM 40 MG PO TBEC: 40.0000 mg | DELAYED_RELEASE_TABLET | Freq: Two times a day (BID) | ORAL | 5 refills | Status: DC

## 2020-02-17 MED ORDER — CETIRIZINE HCL 10 MG PO TABS: 10.0000 mg | ORAL_TABLET | Freq: Every day | ORAL | 5 refills | Status: DC

## 2020-02-17 NOTE — Patient Instructions (Addendum)
The patient should have follow up scheduled with myself in 2 months.   Prior to next visit patient should have: Spirometry/Feno  Flonase - 1 spray on each side of your nose twice a day for first week, then 1 spray on each side.   Instructions for use:  If you also use a saline nasal spray or rinse, use that first.  Position the head with the chin slightly tucked. Use the right hand to spray into the left nostril and the right hand to spray into the left nostril.   Point the bottle away from the septum of your nose (cartilage that divides the two sides of your nose).   Hold the nostril closed on the opposite side from where you will spray  Spray once and gently sniff to pull the medicine into the higher parts of your nose.  Don't sniff too hard as the medicine will drain down the back of your throat instead.  Repeat with a second spray on the same side if prescribed.  Repeat on the other side of your nose.  Take cetirizine - allergy pill once a day. Stop taking allegra D because it makes your blood pressure high.   Take the albuterol rescue inhaler every 4 to 6 hours as needed for wheezing or shortness of breath. You can also take it 15 minutes before exercise or exertional activity. Side effects include heart racing or pounding, jitters or anxiety. If you have a history of an irregular heart rhythm, it can make this worse. Can also give some patients a hard time sleeping.  To inhale the aerosol using an inhaler, follow these steps:  1. Remove the protective dust cap from the end of the mouthpiece. If the dust cap was not placed on the mouthpiece, check the mouthpiece for dirt or other objects. Be sure that the canister is fully and firmly inserted in the mouthpiece. 2. If you are using the inhaler for the first time or if you have not used the inhaler in more than 14 days, you will need to prime it. You may also need to prime the inhaler if it has been dropped. Ask your pharmacist or  check the manufacturer's information if this happens. To prime the inhaler, shake it well and then press down on the canister 4 times to release 4 sprays into the air, away from your face. Be careful not to get albuterol in your eyes. 3. Shake the inhaler well. 4. Breathe out as completely as possible through your mouth. 4. Hold the canister with the mouthpiece on the bottom, facing you and the canister pointing upward. Place the open end of the mouthpiece into your mouth. Close your lips tightly around the mouthpiece. 6. Breathe in slowly and deeply through the mouthpiece.At the same time, press down once on the container to spray the medication into your mouth. 7. Try to hold your breath for 10 seconds. remove the inhaler, and breathe out slowly. 8. If you were told to use 2 puffs, wait 1 minute and then repeat steps 3-7. 9. Replace the protective cap on the inhaler. 10. Clean your inhaler regularly. Follow the manufacturer's directions carefully and ask your doctor or pharmacist if you have any questions about cleaning your inhaler.  Check the back of the inhaler to keep track of the total number of doses left on the inhaler.

## 2020-02-17 NOTE — Progress Notes (Signed)
[name] Merritt    [id number]    Oct 28, 1970  Primary Care Physician:Polite, Jori Moll, MD  Referring Physician: Seward Carol, MD [address] [place],  [place] [address] Reason for Consultation: shortness of breath Date of Consultation: 02/17/2020  Chief complaint:   Chief Complaint  Patient presents with  . Consult    sob with exertion.  Dry cough.  Thick clear mucus at times.  Runny nose and eyes draining.     HPI: Breeze Angell is a 49 y.o. gentleman who presents for new patient evaluation of shortness of breath.   Shortness of breath with exertion, worse with humidity, talking. Cough is dry. Worse at night time and first thing when he wakes up.  Occasionally with mucus. Has been taking allegra D which helps the mucus. He has watery eyes which is worse when he is outside so he has started wearing a mask which has helped. Has not tried any inhalers. A humidifier at nighttime usually helps.    Social history: Lives at home with wife - has a Programmer, systems.  Smoking history: never smoker.  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   Social History   Occupational History  . Not on file  Tobacco Use  . Smoking status: Never Smoker  . Smokeless tobacco: Former Systems developer    Types: Chew  Substance and Sexual Activity  . Alcohol use: No  . Drug use: No  . Sexual activity: Not on file    Relevant family history:  Family History  Problem Relation Age of Onset  . Hypertension Mother   . Diabetes Mother   . Hypertension Father   . Diabetes Father   . Diabetes Maternal Grandmother   . Hypertension Maternal Grandmother   . Diabetes Maternal Grandfather   . Hypertension Maternal Grandfather   . Diabetes Paternal Grandmother   . Hypertension Paternal Grandmother   . Diabetes Paternal Grandfather   . Hypertension Paternal Grandfather   . Asthma Neg Hx     Past Medical History:  Diagnosis Date  . Diabetes mellitus without complication ([place])   . High  cholesterol   . Hypertension   . Overweight     Past Surgical History:  Procedure Laterality Date  . torn rotator cuff  2018   2018     Review of systems: Review of Systems  Constitutional: Negative for chills, fever and weight loss.  HENT: Positive for congestion and sore throat. Negative for sinus pain.   Eyes: Negative for discharge and redness.  Respiratory: Positive for cough and shortness of breath. Negative for hemoptysis, sputum production and wheezing.   Cardiovascular: Negative for chest pain, palpitations and leg swelling.  Gastrointestinal: Positive for heartburn. Negative for nausea and vomiting.  Musculoskeletal: Negative for joint pain and myalgias.  Skin: Negative for rash.  Neurological: Negative for dizziness, tremors, focal weakness and headaches.  Endo/Heme/Allergies: Positive for environmental allergies.  Psychiatric/Behavioral: Negative for depression. The patient is not nervous/anxious.   All other systems reviewed and are negative.   Physical Exam: Blood pressure (!) 160/100, pulse 96, temperature 97.7 F (36.5 C), temperature source Temporal, height 6\' 1"  (1.854 m), weight (!) 320 lb 12.8 oz (145.5 kg), SpO2 96 %. Gen:      No acute distress ENT:  +nasal debris and erythema, no polyps Lungs:    No increased respiratory effort, symmetric chest wall excursion, clear to auscultation bilaterally, no wheezes or crackles CV:  Regular rate and rhythm; no murmurs, rubs, or gallops.  No pedal edema Abd:      + bowel sounds; soft, non-tender; no distension MSK: no acute synovitis of DIP or PIP joints, no mechanics hands.  Skin:      Warm and dry; no rashes Neuro: normal speech, no focal facial asymmetry Psych: alert and oriented x3, normal mood and affect   Data Reviewed/Medical Decision Making:  Independent interpretation of tests: Imaging: . Review of patient's CT Angio images revealed normal lung parenchyma, low lung volumes with bibasilar  atelectasis. The patient's images have been independently reviewed by me.  No PE  PFTs: None on file.   Labs:   Immunization status:  Immunization History  Administered Date(s) Administered  . Influenza,inj,Quad PF,6+ Mos 08/19/2019  . PFIZER SARS-COV-2 Vaccination 01/05/2020, 01/26/2020    . I reviewed prior external note(s) from ED, PCP . I reviewed the result(s) of the labs and imaging as noted above.  . I have ordered PFTs   Assessment:  Chronic Cough - GERD with allergic rhinitis Shortness of breath - possible asthma  Plan/Recommendations:  Plan will be a trial of albuterol for dyspnea. Will obtain spirometry and FeNO to evaluate for possible asthma. Suspect cough is multifactorial from GERD and rhinitis. BID PPI trial with fluticasone intranasal and stop allegra D - switch to cetirizine.  Return to Care: Return in about 2 months (around 04/18/2020).  Durel Salts, MD Pulmonary and Critical Care Medicine [facility] [phone]  CC: Renford Dills, MD

## 2020-02-24 ENCOUNTER — Telehealth: Payer: Self-pay | Admitting: Internal Medicine

## 2020-02-24 NOTE — Telephone Encounter (Signed)
Attempted to call pt's wife Luster Landsberg but unable to reach. Left message for pt to return call.

## 2020-02-24 NOTE — Telephone Encounter (Signed)
Spoke with [employer]. She stated that the patient was seen by Dr. Celine Mans on 4/27 and was advised to follow up in 2 months with a breathing. The breathing test has been scheduled for 6/25.   She stated that his cough is not getting any better after following the recommendations from the 4/27. She is wondering if he needs to have the breathing moved up to a sooner appointment. I advised her that I would check with Dr. Celine Mans first before moving up his appointment.  Dr. Celine Mans, please advise. Thanks!

## 2020-02-24 NOTE — Telephone Encounter (Signed)
Spoke with wife, aware of recs.  Spiro/OV moved to 5/21, covid test moved to 5/18.  Nothing further needed at this time- will close encounter.

## 2020-02-24 NOTE — Telephone Encounter (Signed)
I have no problem with him coming sooner. Can move him to a 1 month follow up with spirometry - putting him at end of may early June.

## 2020-03-09 ENCOUNTER — Other Ambulatory Visit (HOSPITAL_COMMUNITY)
Admission: RE | Admit: 2020-03-09 | Discharge: 2020-03-09 | Disposition: A | Discharge status: Home / Self Care | Payer: BC Managed Care – PPO | Source: Ambulatory Visit | Attending: Internal Medicine | Admitting: Internal Medicine

## 2020-03-09 DIAGNOSIS — Z20822 Contact with and (suspected) exposure to covid-19: Secondary | ICD-10-CM | POA: Diagnosis not present

## 2020-03-09 DIAGNOSIS — Z01812 Encounter for preprocedural laboratory examination: Secondary | ICD-10-CM | POA: Insufficient documentation

## 2020-03-09 LAB — SARS CORONAVIRUS 2 (TAT 6-24 HRS): SARS Coronavirus 2: NEGATIVE

## 2020-03-12 ENCOUNTER — Ambulatory Visit: Payer: 59 | Admitting: Internal Medicine

## 2020-03-12 ENCOUNTER — Ambulatory Visit: Payer: BC Managed Care – PPO

## 2020-03-12 ENCOUNTER — Other Ambulatory Visit: Payer: Self-pay

## 2020-03-12 ENCOUNTER — Encounter: Payer: Self-pay | Admitting: Internal Medicine

## 2020-03-12 VITALS — BP 160/100 | HR 92 | Temp 97.9°F | Ht 73.0 in | Wt 317.8 lb

## 2020-03-12 DIAGNOSIS — R053 Chronic cough: Secondary | ICD-10-CM

## 2020-03-12 DIAGNOSIS — R0602 Shortness of breath: Secondary | ICD-10-CM

## 2020-03-12 DIAGNOSIS — K219 Gastro-esophageal reflux disease without esophagitis: Secondary | ICD-10-CM

## 2020-03-12 DIAGNOSIS — R05 Cough: Secondary | ICD-10-CM | POA: Diagnosis not present

## 2020-03-12 DIAGNOSIS — G4733 Obstructive sleep apnea (adult) (pediatric): Secondary | ICD-10-CM

## 2020-03-12 LAB — NITRIC OXIDE: Nitric Oxide: 19

## 2020-03-12 NOTE — Progress Notes (Signed)
[name] Merritt    [id number]    03-28-1971  Primary Care Physician:Polite, Windy Fast, MD Date of Appointment: 03/12/2020 Established Patient Visit  Chief complaint:  Shortness of breath  HPI: [name] Merritt is a 49 y.o. gentleman with shortness of breath. At iniital visit symptoms concerning for asthma. PFTs were obtained, and he was given a trial of albuterol for dyspnea.   Interval Updates: Here for follow up today after PFTs.  Albuterol is helping shortness of breath symptoms.   Drinks a lot of diet mountain dew. He is a third shift Financial controller.  Blood sugars down to 160-200 from 300-400. Seeing endocrinology soon.   He does snore. He does have spontaneous nocturnal awakenings.  He is tired during the day.   OBSTRUCTIVE SLEEP APNEA SCREENING  1.  Snoring?:  yes 2.  Tired?:  yes 3.  Observed apnea, stop breathing or choking/gasping during sleep?:  yes 4.  Pressure. HTN history?  yes 5.  BMI more than 35 kg/m2?  yes 6.  Age more than 50 yrs?  no 7.  Neck size larger than 17 in for male or 16 in for male?  yes 8.  Gender = Male?  yes  Total:  5  For general population  OSA - Low Risk : Yes to 0 - 2 questions OSA - Intermediate Risk : Yes to 3 - 4 questions OSA - High Risk : Yes to 5 - 8 questions  or Yes to 2 or more of 4 STOP questions + male gender or Yes to 2 or more of 4 STOP questions + BMI > 35kg/m2  or Yes to 2 or more of 4 STOP questions + neck circumference 17 inches / 43cm in male or 16 inches / 41cm in male  References: Landry Dyke al. Anesthesiology 2008; 108: 812-821,  Beecher Mcardle et al Br Michela Pitcher 2012; 108: 416-606,  Beecher Mcardle et al J Clin Sleep Med Sept 2014.   I have reviewed the patient's family social and past medical history and updated as appropriate.   Past Medical History:  Diagnosis Date  . Diabetes mellitus without complication (HCC)   . High cholesterol   . Hypertension   . Overweight     Past Surgical History:  Procedure  Laterality Date  . torn rotator cuff  2018   2018    Family History  Problem Relation Age of Onset  . Hypertension Mother   . Diabetes Mother   . Hypertension Father   . Diabetes Father   . Diabetes Maternal Grandmother   . Hypertension Maternal Grandmother   . Diabetes Maternal Grandfather   . Hypertension Maternal Grandfather   . Diabetes Paternal Grandmother   . Hypertension Paternal Grandmother   . Diabetes Paternal Grandfather   . Hypertension Paternal Grandfather   . Asthma Neg Hx     Social History   Occupational History  . Not on file  Tobacco Use  . Smoking status: Never Smoker  . Smokeless tobacco: Former Neurosurgeon    Types: Chew  Substance and Sexual Activity  . Alcohol use: No  . Drug use: No  . Sexual activity: Not on file     Physical Exam: Blood pressure (!) 160/100, pulse 92, temperature 97.9 F (36.6 C), temperature source Temporal, height 6\' 1"  (1.854 m), weight (!) 317 lb 12.8 oz (144.2 kg), SpO2 96 %.  Gen:      No acute distress ENT:  no nasal polyps, mucus  membranes moist +cobblestoning, mallampati IV Lungs:    No increased respiratory effort, symmetric chest wall excursion, clear to auscultation bilaterally, no wheezes or crackles CV:         Regular rate and rhythm; no murmurs, rubs, or gallops.  No pedal edema   Data Reviewed: Imaging: CT Angio march 2021 demonstrates low lung volumes. No PE. No pneumonia or effusion.   PFTs: Spirometry on 03/12/2020 demonstrates no airflow limitation. No significant BD response. FENO 19 ppb  Labs: Lab Results  Component Value Date   WBC 6.2 09/25/2013   HGB 14.6 01/01/2020   HCT 43.0 01/01/2020   MCV 92.3 09/25/2013   PLT 230 09/25/2013   Lab Results  Component Value Date   NA 139 01/01/2020   K 4.1 01/01/2020   CL 104 01/01/2020   CO2 25 09/25/2013     Immunization status: Immunization History  Administered Date(s) Administered  . Influenza,inj,Quad PF,6+ Mos 08/19/2019  . PFIZER  SARS-COV-2 Vaccination 01/05/2020, 01/26/2020    Assessment:  Shortness of breath OSA - concern with excessive Chronic Cough - suspect uncontrolled GERD   Plan/Recommendations: Suspect ongoing cough worse from reflux. Continue PPI.  Sleep with wedge pillow, needs to cut back on caffeine and carbonated beverages.  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 However high risk for OSA. Will plan for HSAT.  Discussed weight loss for a while as well today. He is following up with endocrinology to get his DM under better control as well.   Can continue prn albuterol for now - although I suspect his weight and untreated OSA are probably playing more into his symptoms.   Return to Care: Return in about 4 months (around 07/13/2020).   Lenice Llamas, MD Pulmonary and [place]

## 2020-03-12 NOTE — Patient Instructions (Addendum)
The patient should have follow up scheduled with myself in 4 months.   Prior to next visit patient should have: Sleep Apnea Testing.  Continue protonix for your acid reflux Start sleeping with a wedge pillow.   What is GERD? Gastroesophageal reflux disease (GERD) is gastroesophageal reflux diseasewhich occurs when the lower esophageal sphincter (LES) opens spontaneously, for varying periods of time, or does not close properly and stomach contents rise up into the esophagus. GER is also called acid reflux or acid regurgitation, because digestive juices--called acids--rise up with the food. The esophagus is the tube that carries food from the mouth to the stomach. The LES is a ring of muscle at the bottom of the esophagus that acts like a valve between the esophagus and stomach.  When acid reflux occurs, food or fluid can be tasted in the back of the mouth. When refluxed stomach acid touches the lining of the esophagus it may cause a burning sensation in the chest or throat called heartburn or acid indigestion. Occasional reflux is common. Persistent reflux that occurs more than twice a week is considered GERD, and it can eventually lead to more serious health problems. People of all ages can have GERD. Studies have shown that GERD may worsen or contribute to asthma, chronic cough, and pulmonary fibrosis.   What are the symptoms of GERD? The main symptom of GERD in adults is frequent heartburn, also called acid indigestion--burning-type pain in the lower part of the mid-chest, behind the breast bone, and in the mid-abdomen.  Not all reflux is acidic in nature, and many patients don't have heart burn at all. Sometimes it feels like a cough (either dry or with mucus), choking sensation, asthma, shortness of breath, waking up at night, frequent throat clearing, or trouble swallowing.    What causes GERD? The reason some people develop GERD is still unclear. However, research shows that in people with  GERD, the LES relaxes while the rest of the esophagus is working. Anatomical abnormalities such as a hiatal hernia may also contribute to GERD. A hiatal hernia occurs when the upper part of the stomach and the LES move above the diaphragm, the muscle wall that separates the stomach from the chest. Normally, the diaphragm helps the LES keep acid from rising up into the esophagus. When a hiatal hernia is present, acid reflux can occur more easily. A hiatal hernia can occur in people of any age and is most often a normal finding in otherwise healthy people over age 56. Most of the time, a hiatal hernia produces no symptoms.   Other factors that may contribute to GERD include - Obesity or recent weight gain - Pregnancy  - Smoking  - Diet - Certain medications  Common foods that can worsen reflux symptoms include: - carbonated beverages - artificial sweeteners - citrus fruits  - chocolate  - drinks with caffeine or alcohol  - fatty and fried foods  - garlic and onions  - mint flavorings  - spicy foods  - tomato-based foods, like spaghetti sauce, salsa, chili, and pizza   Lifestyle Changes If you smoke, stop.  Avoid foods and beverages that worsen symptoms (see above.) Lose weight if needed.  Eat small, frequent meals.  Wear loose-fitting clothes.  Avoid lying down for 3 hours after a meal.  Raise the head of your bed 6 to 8 inches by securing wood blocks under the bedposts. Just using extra pillows will not help, but using a wedge-shaped pillow may be helpful.  Medications  H2 blockers, such as cimetidine (Tagamet HB), famotidine (Pepcid AC), nizatidine (Axid AR), and ranitidine (Zantac 75), decrease acid production. They are available in prescription strength and over-the-counter strength. These drugs provide short-term relief and are effective for about half of those who have GERD symptoms.  Proton pump inhibitors include omeprazole (Prilosec, Zegerid), lansoprazole (Prevacid),  pantoprazole (Protonix), rabeprazole (Aciphex), and esomeprazole (Nexium), which are available by prescription. Prilosec is also available in over-the-counter strength. Proton pump inhibitors are more effective than H2 blockers and can relieve symptoms and heal the esophageal lining in almost everyone who has GERD.  Because drugs work in different ways, combinations of medications may help control symptoms. People who get heartburn after eating may take both antacids and H2 blockers. The antacids work first to neutralize the acid in the stomach, and then the H2 blockers act on acid production. By the time the antacid stops working, the H2 blocker will have stopped acid production. Your health care provider is the best source of information about how to use medications for GERD.   Points to Remember 1. You can have GERD without having heartburn. Your symptoms could include a dry cough, asthma symptoms, or trouble swallowing.  2. Taking medications daily as prescribed is important in controlling you symptoms.  Sometimes it can take up to 8 weeks to fully achieve the effects of the medications prescribed.  3. Coughing related to GERD can be difficult to treat and is very frustrating!  However, it is important to stick with these medications and lifestyle modifications before pursuing more aggressive or invasive test and treatments.

## 2020-04-13 ENCOUNTER — Other Ambulatory Visit (HOSPITAL_COMMUNITY): Payer: 59

## 2020-04-16 ENCOUNTER — Other Ambulatory Visit: Payer: 59

## 2020-04-16 ENCOUNTER — Ambulatory Visit: Payer: Self-pay | Admitting: Endocrinology

## 2020-04-27 ENCOUNTER — Ambulatory Visit: Payer: BC Managed Care – PPO

## 2020-04-27 ENCOUNTER — Other Ambulatory Visit: Payer: Self-pay

## 2020-04-27 DIAGNOSIS — G4733 Obstructive sleep apnea (adult) (pediatric): Secondary | ICD-10-CM

## 2020-04-29 ENCOUNTER — Encounter: Payer: Self-pay | Admitting: Endocrinology

## 2020-04-29 ENCOUNTER — Other Ambulatory Visit: Payer: Self-pay

## 2020-04-29 LAB — MICROALBUMIN / CREATININE URINE RATIO: Microalb Creat Ratio: 55.4

## 2020-05-05 ENCOUNTER — Telehealth: Payer: Self-pay | Admitting: Pulmonary Disease

## 2020-05-05 DIAGNOSIS — G4733 Obstructive sleep apnea (adult) (pediatric): Secondary | ICD-10-CM | POA: Diagnosis not present

## 2020-05-05 NOTE — Telephone Encounter (Signed)
HST 04/28/20 >> AHI 15.4, SpO2 low 74%.  Spent 54.4 min of test time with SpO2 < 89%.   Patient of Dr. Humphrey Rolls.  Please inform him that his sleep study shows moderate obstructive sleep apnea and low oxygen at night.  Please arrange for ROV one of the NPs to discuss treatment options.

## 2020-05-05 NOTE — Telephone Encounter (Signed)
Patient called with results of home sleep study, follow up appointment made with a nurse practitioner.

## 2020-05-10 ENCOUNTER — Ambulatory Visit: Payer: BC Managed Care – PPO | Admitting: Adult Health

## 2020-05-20 ENCOUNTER — Ambulatory Visit: Payer: BC Managed Care – PPO | Admitting: Primary Care

## 2020-05-25 ENCOUNTER — Other Ambulatory Visit: Payer: Self-pay | Admitting: Internal Medicine

## 2020-05-25 ENCOUNTER — Ambulatory Visit: Payer: Self-pay | Admitting: Endocrinology

## 2020-05-30 ENCOUNTER — Other Ambulatory Visit: Payer: Self-pay

## 2020-05-30 ENCOUNTER — Emergency Department (HOSPITAL_COMMUNITY): Payer: BC Managed Care – PPO

## 2020-05-30 ENCOUNTER — Encounter (HOSPITAL_COMMUNITY)
Admission: EM | Disposition: A | Discharge status: Home / Self Care | Payer: Self-pay | Source: Home / Self Care | Attending: Cardiovascular Disease

## 2020-05-30 ENCOUNTER — Inpatient Hospital Stay (HOSPITAL_COMMUNITY)
Admission: EM | Admit: 2020-05-30 | Discharge: 2020-06-01 | DRG: 247 | Disposition: A | Discharge status: Home / Self Care | Payer: BC Managed Care – PPO | Attending: Cardiovascular Disease | Admitting: Cardiovascular Disease

## 2020-05-30 ENCOUNTER — Encounter (HOSPITAL_COMMUNITY): Payer: Self-pay | Admitting: Emergency Medicine

## 2020-05-30 DIAGNOSIS — Z833 Family history of diabetes mellitus: Secondary | ICD-10-CM | POA: Diagnosis not present

## 2020-05-30 DIAGNOSIS — T502X5A Adverse effect of carbonic-anhydrase inhibitors, benzothiadiazides and other diuretics, initial encounter: Secondary | ICD-10-CM | POA: Diagnosis present

## 2020-05-30 DIAGNOSIS — E785 Hyperlipidemia, unspecified: Secondary | ICD-10-CM | POA: Diagnosis present

## 2020-05-30 DIAGNOSIS — Z794 Long term (current) use of insulin: Secondary | ICD-10-CM

## 2020-05-30 DIAGNOSIS — I25110 Atherosclerotic heart disease of native coronary artery with unstable angina pectoris: Secondary | ICD-10-CM | POA: Diagnosis present

## 2020-05-30 DIAGNOSIS — Z955 Presence of coronary angioplasty implant and graft: Secondary | ICD-10-CM

## 2020-05-30 DIAGNOSIS — E871 Hypo-osmolality and hyponatremia: Secondary | ICD-10-CM | POA: Diagnosis not present

## 2020-05-30 DIAGNOSIS — Z79899 Other long term (current) drug therapy: Secondary | ICD-10-CM | POA: Diagnosis not present

## 2020-05-30 DIAGNOSIS — E7800 Pure hypercholesterolemia, unspecified: Secondary | ICD-10-CM | POA: Diagnosis not present

## 2020-05-30 DIAGNOSIS — R81 Glycosuria: Secondary | ICD-10-CM | POA: Diagnosis present

## 2020-05-30 DIAGNOSIS — Z6839 Body mass index (BMI) 39.0-39.9, adult: Secondary | ICD-10-CM

## 2020-05-30 DIAGNOSIS — E118 Type 2 diabetes mellitus with unspecified complications: Secondary | ICD-10-CM | POA: Diagnosis not present

## 2020-05-30 DIAGNOSIS — I2102 ST elevation (STEMI) myocardial infarction involving left anterior descending coronary artery: Principal | ICD-10-CM

## 2020-05-30 DIAGNOSIS — I213 ST elevation (STEMI) myocardial infarction of unspecified site: Secondary | ICD-10-CM

## 2020-05-30 DIAGNOSIS — Z8249 Family history of ischemic heart disease and other diseases of the circulatory system: Secondary | ICD-10-CM

## 2020-05-30 DIAGNOSIS — Z888 Allergy status to other drugs, medicaments and biological substances status: Secondary | ICD-10-CM | POA: Diagnosis not present

## 2020-05-30 DIAGNOSIS — G4733 Obstructive sleep apnea (adult) (pediatric): Secondary | ICD-10-CM | POA: Diagnosis present

## 2020-05-30 DIAGNOSIS — R509 Fever, unspecified: Secondary | ICD-10-CM | POA: Diagnosis present

## 2020-05-30 DIAGNOSIS — I10 Essential (primary) hypertension: Secondary | ICD-10-CM

## 2020-05-30 DIAGNOSIS — R319 Hematuria, unspecified: Secondary | ICD-10-CM | POA: Diagnosis present

## 2020-05-30 DIAGNOSIS — Z20822 Contact with and (suspected) exposure to covid-19: Secondary | ICD-10-CM | POA: Diagnosis present

## 2020-05-30 DIAGNOSIS — R05 Cough: Secondary | ICD-10-CM | POA: Diagnosis present

## 2020-05-30 DIAGNOSIS — I255 Ischemic cardiomyopathy: Secondary | ICD-10-CM | POA: Diagnosis not present

## 2020-05-30 DIAGNOSIS — I219 Acute myocardial infarction, unspecified: Secondary | ICD-10-CM

## 2020-05-30 DIAGNOSIS — I2510 Atherosclerotic heart disease of native coronary artery without angina pectoris: Secondary | ICD-10-CM

## 2020-05-30 DIAGNOSIS — E1165 Type 2 diabetes mellitus with hyperglycemia: Secondary | ICD-10-CM | POA: Diagnosis present

## 2020-05-30 DIAGNOSIS — E119 Type 2 diabetes mellitus without complications: Secondary | ICD-10-CM

## 2020-05-30 HISTORY — PX: CORONARY STENT INTERVENTION: CATH118234

## 2020-05-30 HISTORY — PX: LEFT HEART CATH AND CORONARY ANGIOGRAPHY: CATH118249

## 2020-05-30 HISTORY — PX: CORONARY/GRAFT ACUTE MI REVASCULARIZATION: CATH118305

## 2020-05-30 HISTORY — DX: Acute myocardial infarction, unspecified: I21.9

## 2020-05-30 LAB — LIPID PANEL
Cholesterol: 245 mg/dL — ABNORMAL HIGH (ref 0–200)
HDL: 48 mg/dL (ref >40)
LDL Cholesterol: 182 mg/dL — ABNORMAL HIGH (ref 0–99)
Total CHOL/HDL Ratio: 5.1 RATIO
Triglycerides: 75 mg/dL (ref <150)
VLDL: 15 mg/dL (ref 0–40)

## 2020-05-30 LAB — CBC WITH DIFFERENTIAL/PLATELET
Abs Immature Granulocytes: 0.05 10*3/uL (ref 0.00–0.07)
Basophils Absolute: 0 10*3/uL (ref 0.0–0.1)
Basophils Relative: 0 %
Eosinophils Absolute: 0 10*3/uL (ref 0.0–0.5)
Eosinophils Relative: 0 %
HCT: 49.7 % (ref 39.0–52.0)
Hemoglobin: 16.2 g/dL (ref 13.0–17.0)
Immature Granulocytes: 0 %
Lymphocytes Relative: 18 %
Lymphs Abs: 2.4 10*3/uL (ref 0.7–4.0)
MCH: 30.3 pg (ref 26.0–34.0)
MCHC: 32.6 g/dL (ref 30.0–36.0)
MCV: 92.9 fL (ref 80.0–100.0)
Monocytes Absolute: 1.3 10*3/uL — ABNORMAL HIGH (ref 0.1–1.0)
Monocytes Relative: 10 %
Neutro Abs: 9.8 10*3/uL — ABNORMAL HIGH (ref 1.7–7.7)
Neutrophils Relative %: 72 %
Platelets: 331 10*3/uL (ref 150–400)
RBC: 5.35 MIL/uL (ref 4.22–5.81)
RDW: 11.9 % (ref 11.5–15.5)
WBC: 13.6 10*3/uL — ABNORMAL HIGH (ref 4.0–10.5)
nRBC: 0 % (ref 0.0–0.2)

## 2020-05-30 LAB — MRSA PCR SCREENING: MRSA by PCR: NEGATIVE

## 2020-05-30 LAB — COMPREHENSIVE METABOLIC PANEL
ALT: 31 U/L (ref 0–44)
AST: 74 U/L — ABNORMAL HIGH (ref 15–41)
Albumin: 3.7 g/dL (ref 3.5–5.0)
Alkaline Phosphatase: 62 U/L (ref 38–126)
Anion gap: 14 (ref 5–15)
BUN: 16 mg/dL (ref 6–20)
CO2: 23 mmol/L (ref 22–32)
Calcium: 9.4 mg/dL (ref 8.9–10.3)
Chloride: 94 mmol/L — ABNORMAL LOW (ref 98–111)
Creatinine, Ser: 1.3 mg/dL — ABNORMAL HIGH (ref 0.61–1.24)
GFR calc Af Amer: >60 mL/min (ref >60)
GFR calc non Af Amer: >60 mL/min (ref >60)
Glucose, Bld: 462 mg/dL — ABNORMAL HIGH (ref 70–99)
Potassium: 4.4 mmol/L (ref 3.5–5.1)
Sodium: 131 mmol/L — ABNORMAL LOW (ref 135–145)
Total Bilirubin: 1.1 mg/dL (ref 0.3–1.2)
Total Protein: 8 g/dL (ref 6.5–8.1)

## 2020-05-30 LAB — HEMOGLOBIN A1C
Hgb A1c MFr Bld: 9.8 % — ABNORMAL HIGH (ref 4.8–5.6)
Mean Plasma Glucose: 234.56 mg/dL

## 2020-05-30 LAB — TROPONIN I (HIGH SENSITIVITY): Troponin I (High Sensitivity): 7578 ng/L (ref <18)

## 2020-05-30 LAB — GLUCOSE, CAPILLARY
Glucose-Capillary: 380 mg/dL — ABNORMAL HIGH (ref 70–99)
Glucose-Capillary: 327 mg/dL — ABNORMAL HIGH (ref 70–99)

## 2020-05-30 LAB — APTT: aPTT: 33 seconds (ref 24–36)

## 2020-05-30 LAB — PROTIME-INR
INR: 1 (ref 0.8–1.2)
Prothrombin Time: 13.1 seconds (ref 11.4–15.2)

## 2020-05-30 LAB — SARS CORONAVIRUS 2 BY RT PCR (HOSPITAL ORDER, PERFORMED IN ~~LOC~~ HOSPITAL LAB): SARS Coronavirus 2: NEGATIVE

## 2020-05-30 SURGERY — CORONARY/GRAFT ACUTE MI REVASCULARIZATION
Anesthesia: LOCAL

## 2020-05-30 MED ORDER — TICAGRELOR 90 MG PO TABS
90.0000 mg | ORAL_TABLET | Freq: Two times a day (BID) | ORAL | Status: DC
  Administered 2020-05-30 – 2020-06-01 (×4): 90 mg via ORAL
  Filled 2020-05-30 (×4): qty 1

## 2020-05-30 MED ORDER — INSULIN ASPART 100 UNIT/ML ~~LOC~~ SOLN
0.0000 [IU] | Freq: Three times a day (TID) | SUBCUTANEOUS | Status: DC
  Administered 2020-05-30: 9 [IU] via SUBCUTANEOUS
  Administered 2020-05-31: 7 [IU] via SUBCUTANEOUS
  Administered 2020-05-31 (×2): 5 [IU] via SUBCUTANEOUS

## 2020-05-30 MED ORDER — TICAGRELOR 90 MG PO TABS
ORAL_TABLET | ORAL | Status: AC
  Filled 2020-05-30: qty 2

## 2020-05-30 MED ORDER — BENZONATATE 100 MG PO CAPS: 100.0000 mg | ORAL_CAPSULE | Freq: Three times a day (TID) | ORAL | Status: DC | PRN

## 2020-05-30 MED ORDER — NITROGLYCERIN 1 MG/10 ML FOR IR/CATH LAB
INTRA_ARTERIAL | Status: DC | PRN
  Administered 2020-05-30 (×3): 200 ug via INTRACORONARY

## 2020-05-30 MED ORDER — ACETAMINOPHEN 325 MG PO TABS
650.0000 mg | ORAL_TABLET | ORAL | Status: DC | PRN
  Administered 2020-05-30 – 2020-05-31 (×3): 650 mg via ORAL
  Filled 2020-05-30 (×3): qty 2

## 2020-05-30 MED ORDER — ENOXAPARIN SODIUM 40 MG/0.4ML ~~LOC~~ SOLN
40.0000 mg | SUBCUTANEOUS | Status: DC
  Administered 2020-05-31 – 2020-06-01 (×2): 40 mg via SUBCUTANEOUS
  Filled 2020-05-30 (×2): qty 0.4

## 2020-05-30 MED ORDER — VERAPAMIL HCL 2.5 MG/ML IV SOLN
INTRAVENOUS | Status: DC | PRN
  Administered 2020-05-30: 10 mL via INTRA_ARTERIAL

## 2020-05-30 MED ORDER — NITROGLYCERIN 1 MG/10 ML FOR IR/CATH LAB
INTRA_ARTERIAL | Status: AC
  Filled 2020-05-30: qty 10

## 2020-05-30 MED ORDER — INSULIN GLARGINE 100 UNIT/ML ~~LOC~~ SOLN
44.0000 [IU] | Freq: Every day | SUBCUTANEOUS | Status: DC
  Administered 2020-05-30: 44 [IU] via SUBCUTANEOUS
  Filled 2020-05-30 (×2): qty 0.44

## 2020-05-30 MED ORDER — SODIUM CHLORIDE 0.9 % IV SOLN: INTRAVENOUS | Status: DC

## 2020-05-30 MED ORDER — ASPIRIN 81 MG PO CHEW
81.0000 mg | CHEWABLE_TABLET | Freq: Every day | ORAL | Status: DC
  Administered 2020-05-31 – 2020-06-01 (×2): 81 mg via ORAL
  Filled 2020-05-30 (×2): qty 1

## 2020-05-30 MED ORDER — FENTANYL CITRATE (PF) 100 MCG/2ML IJ SOLN
INTRAMUSCULAR | Status: DC | PRN
  Administered 2020-05-30: 50 ug via INTRAVENOUS

## 2020-05-30 MED ORDER — IOHEXOL 350 MG/ML SOLN
INTRAVENOUS | Status: AC
  Filled 2020-05-30: qty 1

## 2020-05-30 MED ORDER — HEPARIN SODIUM (PORCINE) 1000 UNIT/ML IJ SOLN
INTRAMUSCULAR | Status: DC | PRN
  Administered 2020-05-30 (×2): 2000 [IU] via INTRAVENOUS
  Administered 2020-05-30: 5000 [IU] via INTRAVENOUS
  Administered 2020-05-30: 4000 [IU] via INTRAVENOUS

## 2020-05-30 MED ORDER — VERAPAMIL HCL 2.5 MG/ML IV SOLN
INTRAVENOUS | Status: AC
  Filled 2020-05-30: qty 2

## 2020-05-30 MED ORDER — HEPARIN SODIUM (PORCINE) 5000 UNIT/ML IJ SOLN
INTRAMUSCULAR | Status: AC
  Filled 2020-05-30: qty 1

## 2020-05-30 MED ORDER — LIDOCAINE HCL (PF) 1 % IJ SOLN
INTRAMUSCULAR | Status: DC | PRN
  Administered 2020-05-30: 5 mL via SUBCUTANEOUS

## 2020-05-30 MED ORDER — CHLORHEXIDINE GLUCONATE CLOTH 2 % EX PADS
6.0000 | MEDICATED_PAD | Freq: Every day | CUTANEOUS | Status: DC
  Administered 2020-05-31: 6 via TOPICAL

## 2020-05-30 MED ORDER — MORPHINE SULFATE (PF) 4 MG/ML IV SOLN
4.0000 mg | Freq: Once | INTRAVENOUS | Status: AC
  Administered 2020-05-30: 4 mg via INTRAVENOUS
  Filled 2020-05-30: qty 1

## 2020-05-30 MED ORDER — INSULIN ASPART 100 UNIT/ML ~~LOC~~ SOLN
0.0000 [IU] | Freq: Every day | SUBCUTANEOUS | Status: DC
  Administered 2020-05-30: 4 [IU] via SUBCUTANEOUS
  Administered 2020-05-31: 3 [IU] via SUBCUTANEOUS

## 2020-05-30 MED ORDER — LOSARTAN POTASSIUM 50 MG PO TABS
50.0000 mg | ORAL_TABLET | Freq: Every day | ORAL | Status: DC
  Administered 2020-05-31 – 2020-06-01 (×2): 50 mg via ORAL
  Filled 2020-05-30 (×2): qty 1

## 2020-05-30 MED ORDER — SODIUM CHLORIDE 0.9 % IV SOLN: 250.0000 mL | INTRAVENOUS | Status: DC | PRN

## 2020-05-30 MED ORDER — ASPIRIN 81 MG PO CHEW
324.0000 mg | CHEWABLE_TABLET | Freq: Once | ORAL | Status: AC
  Administered 2020-05-30: 324 mg via ORAL
  Filled 2020-05-30: qty 4

## 2020-05-30 MED ORDER — LABETALOL HCL 5 MG/ML IV SOLN
10.0000 mg | INTRAVENOUS | Status: AC | PRN
  Administered 2020-05-30: 10 mg via INTRAVENOUS
  Filled 2020-05-30: qty 4

## 2020-05-30 MED ORDER — SODIUM CHLORIDE 0.9% FLUSH
3.0000 mL | Freq: Two times a day (BID) | INTRAVENOUS | Status: DC
  Administered 2020-05-31 – 2020-06-01 (×2): 3 mL via INTRAVENOUS

## 2020-05-30 MED ORDER — TICAGRELOR 90 MG PO TABS
ORAL_TABLET | ORAL | Status: DC | PRN
  Administered 2020-05-30: 180 mg via ORAL

## 2020-05-30 MED ORDER — LORATADINE 10 MG PO TABS
10.0000 mg | ORAL_TABLET | Freq: Every day | ORAL | Status: DC
  Administered 2020-05-31 – 2020-06-01 (×2): 10 mg via ORAL
  Filled 2020-05-30 (×2): qty 1

## 2020-05-30 MED ORDER — DIAZEPAM 5 MG PO TABS
5.0000 mg | ORAL_TABLET | Freq: Four times a day (QID) | ORAL | Status: DC | PRN
  Administered 2020-05-30: 5 mg via ORAL
  Filled 2020-05-30: qty 1

## 2020-05-30 MED ORDER — SODIUM CHLORIDE 0.9% FLUSH: 3.0000 mL | INTRAVENOUS | Status: DC | PRN

## 2020-05-30 MED ORDER — MIDAZOLAM HCL 2 MG/2ML IJ SOLN
INTRAMUSCULAR | Status: AC
  Filled 2020-05-30: qty 2

## 2020-05-30 MED ORDER — HYDRALAZINE HCL 20 MG/ML IJ SOLN: 10.0000 mg | INTRAMUSCULAR | Status: AC | PRN

## 2020-05-30 MED ORDER — METOPROLOL TARTRATE 12.5 MG HALF TABLET
12.5000 mg | ORAL_TABLET | Freq: Two times a day (BID) | ORAL | Status: DC
  Administered 2020-05-30: 12.5 mg via ORAL
  Filled 2020-05-30: qty 1

## 2020-05-30 MED ORDER — ACETAMINOPHEN 500 MG PO TABS
1000.0000 mg | ORAL_TABLET | Freq: Once | ORAL | Status: AC
  Administered 2020-05-31: 1000 mg via ORAL
  Filled 2020-05-30: qty 2

## 2020-05-30 MED ORDER — ONDANSETRON HCL 4 MG/2ML IJ SOLN
4.0000 mg | Freq: Four times a day (QID) | INTRAMUSCULAR | Status: DC | PRN
  Administered 2020-05-31: 4 mg via INTRAVENOUS
  Filled 2020-05-30: qty 2

## 2020-05-30 MED ORDER — HEPARIN SODIUM (PORCINE) 5000 UNIT/ML IJ SOLN
4000.0000 [IU] | Freq: Once | INTRAMUSCULAR | Status: AC
  Administered 2020-05-30: 4000 [IU] via INTRAVENOUS

## 2020-05-30 MED ORDER — HEPARIN (PORCINE) IN NACL 1000-0.9 UT/500ML-% IV SOLN
INTRAVENOUS | Status: DC | PRN
  Administered 2020-05-30 (×2): 500 mL

## 2020-05-30 MED ORDER — NITROGLYCERIN IN D5W 200-5 MCG/ML-% IV SOLN: 0.0000 ug/min | INTRAVENOUS | Status: DC

## 2020-05-30 MED ORDER — HEPARIN SODIUM (PORCINE) 1000 UNIT/ML IJ SOLN
INTRAMUSCULAR | Status: AC
  Filled 2020-05-30: qty 1

## 2020-05-30 MED ORDER — HEPARIN (PORCINE) IN NACL 1000-0.9 UT/500ML-% IV SOLN
INTRAVENOUS | Status: AC
  Filled 2020-05-30: qty 1000

## 2020-05-30 MED ORDER — INSULIN GLARGINE 100 UNIT/ML SOLOSTAR PEN: 44.0000 [IU] | PEN_INJECTOR | Freq: Every day | SUBCUTANEOUS | Status: DC

## 2020-05-30 MED ORDER — LIDOCAINE HCL (PF) 1 % IJ SOLN
INTRAMUSCULAR | Status: AC
  Filled 2020-05-30: qty 30

## 2020-05-30 MED ORDER — ATORVASTATIN CALCIUM 80 MG PO TABS
80.0000 mg | ORAL_TABLET | Freq: Every day | ORAL | Status: DC
  Administered 2020-05-30 – 2020-05-31 (×2): 80 mg via ORAL
  Filled 2020-05-30 (×2): qty 1

## 2020-05-30 MED ORDER — FENTANYL CITRATE (PF) 100 MCG/2ML IJ SOLN
INTRAMUSCULAR | Status: AC
  Filled 2020-05-30: qty 2

## 2020-05-30 MED ORDER — NITROGLYCERIN 0.4 MG SL SUBL
0.4000 mg | SUBLINGUAL_TABLET | SUBLINGUAL | Status: DC | PRN
  Administered 2020-05-30 (×2): 0.4 mg via SUBLINGUAL
  Filled 2020-05-30 (×4): qty 1

## 2020-05-30 MED ORDER — IOHEXOL 350 MG/ML SOLN
INTRAVENOUS | Status: DC | PRN
  Administered 2020-05-30: 175 mL via INTRA_ARTERIAL

## 2020-05-30 MED ORDER — MIDAZOLAM HCL 2 MG/2ML IJ SOLN
INTRAMUSCULAR | Status: DC | PRN
  Administered 2020-05-30: 2 mg via INTRAVENOUS

## 2020-05-30 MED ORDER — GUAIFENESIN 200 MG PO TABS
400.0000 mg | ORAL_TABLET | ORAL | Status: DC | PRN
  Filled 2020-05-30: qty 2

## 2020-05-30 SURGICAL SUPPLY — 16 items
BALLN SAPPHIRE 2.0X12 (BALLOONS) ×2
BALLN SAPPHIRE ~~LOC~~ 3.25X15 (BALLOONS) ×2 IMPLANT
BALLOON SAPPHIRE 2.0X12 (BALLOONS) ×1 IMPLANT
CATH INFINITI JR4 5F (CATHETERS) ×2 IMPLANT
CATH VISTA GUIDE 6FR XBLAD3.5 (CATHETERS) ×2 IMPLANT
DEVICE RAD COMP TR BAND LRG (VASCULAR PRODUCTS) ×2 IMPLANT
GLIDESHEATH SLEND SS 6F .021 (SHEATH) ×2 IMPLANT
GUIDEWIRE INQWIRE 1.5J.035X260 (WIRE) ×1 IMPLANT
INQWIRE 1.5J .035X260CM (WIRE) ×2
KIT ENCORE 26 ADVANTAGE (KITS) ×2 IMPLANT
KIT HEART LEFT (KITS) ×2 IMPLANT
PACK CARDIAC CATHETERIZATION (CUSTOM PROCEDURE TRAY) ×2 IMPLANT
STENT RESOLUTE ONYX 3.0X22 (Permanent Stent) ×2 IMPLANT
TRANSDUCER W/STOPCOCK (MISCELLANEOUS) ×2 IMPLANT
TUBING CIL FLEX 10 FLL-RA (TUBING) ×2 IMPLANT
WIRE COUGAR XT STRL 190CM (WIRE) ×2 IMPLANT

## 2020-05-30 NOTE — ED Notes (Signed)
Pt took Mag Citrate last night for constipation, last BM this am

## 2020-05-30 NOTE — H&P (Addendum)
Cardiology Admission History and Physical:   Patient ID: [name] Merritt MRN: [id number]; DOB: July 19, 1971   Admission date: 05/30/2020  Primary Care Provider: Renford Dills, MD [facility] Cardiologist: Nicki Guadalajara, MD [facility] Electrophysiologist:  None   Chief Complaint:  Anterolateral STEMI  Patient Profile:   [name] Merritt is a 49 y.o. male with a history significant for IDDM, HTN, HLD, obesity, and suspected sleep apnea presented to [facility] by private vehicle with anterior STEMI.   History of Present Illness:   [name] Merritt has no prior cardiac history. He presented to [facility] with complaints of chest pain. He reports intermittent chest pain all day yesterday that subsided at bedtime. He woke up with 10/10 crushing substernal chest pain at 0500 today prompting evaluation. CP radiates to his left arm and is associated with fatigue. He arrived by private vehicle. On arrival, EKG showed ST elevation in anterior and lateral leads with reciprocal changes. CODE STEMI was activated and he was taken emergently to the cath lab.   Family history include DM and HTN in both parents. Mother recently admitted for ACS. He is a never smoker.     Past Medical History:  Diagnosis Date  . Diabetes mellitus without complication (HCC)   . High cholesterol   . Hypertension   . Overweight     Past Surgical History:  Procedure Laterality Date  . torn rotator cuff  2018   2018     Medications Prior to Admission: Prior to Admission medications   Medication Sig Start Date End Date Taking? Authorizing Provider  cetirizine (ZYRTEC) 10 MG tablet Take 1 tablet (10 mg total) by mouth daily. 02/17/20   Charlott Holler, MD  fluticasone (FLONASE) 50 MCG/ACT nasal spray SHAKE LIQUID AND USE 1 SPRAY IN EACH NOSTRIL DAILY 05/25/20   Charlott Holler, MD  hydrochlorothiazide (HYDRODIURIL) 25 MG tablet Take 25 mg by mouth daily. 02/08/20   [provider]  LANTUS SOLOSTAR 100 UNIT/ML  Solostar Pen Inject 44 Units into the skin at bedtime. 04/27/20   [provider]  losartan (COZAAR) 100 MG tablet Take 1 tablet (100 mg total) by mouth daily. 04/29/20   Romero Belling, MD  rosuvastatin (CRESTOR) 10 MG tablet Take 10 mg by mouth at bedtime. 02/11/20   [provider]     Allergies:    Allergies  Allergen Reactions  . Rosuvastatin Calcium Hives and Rash    Social History:   Social History   Socioeconomic History  . Marital status: Single    Spouse name: Not on file  . Number of children: Not on file  . Years of education: Not on file  . Highest education level: Not on file  Occupational History  . Not on file  Tobacco Use  . Smoking status: Never Smoker  . Smokeless tobacco: Former Neurosurgeon    Types: Engineer, drilling  . Vaping Use: Never assessed  Substance and Sexual Activity  . Alcohol use: No  . Drug use: No  . Sexual activity: Not on file  Other Topics Concern  . Not on file  Social History Narrative  . Not on file   Social Determinants of Health   Financial Resource Strain:   . Difficulty of Paying Living Expenses:   Food Insecurity:   . Worried About Programme researcher, broadcasting/film/video in the Last Year:   . Barista in the Last Year:   Transportation Needs:   . Freight forwarder (Medical):   Marland Kitchen  Lack of Transportation (Non-Medical):   Physical Activity:   . Days of Exercise per Week:   . Minutes of Exercise per Session:   Stress:   . Feeling of Stress :   Social Connections:   . Frequency of Communication with Friends and Family:   . Frequency of Social Gatherings with Friends and Family:   . Attends Religious Services:   . Active Member of Clubs or Organizations:   . Attends Banker Meetings:   Marland Kitchen Marital Status:   Intimate Partner Violence:   . Fear of Current or Ex-Partner:   . Emotionally Abused:   Marland Kitchen Physically Abused:   . Sexually Abused:     Family History:   The patient's family history includes Diabetes in  his father, maternal grandfather, maternal grandmother, mother, paternal grandfather, and paternal grandmother; Hypertension in his father, maternal grandfather, maternal grandmother, mother, paternal grandfather, and paternal grandmother. There is no history of Asthma.    ROS:  Please see the history of present illness.  All other ROS reviewed and negative.     Physical Exam/Data:   Vitals:   05/30/20 1005 05/30/20 1012 05/30/20 1015 05/30/20 1050  BP:  (!) 163/100 (!) 146/105   Pulse:  (!) 110 (!) 110   Resp:  (!) 24 (!) 37   Temp:  (!) 101 F (38.3 C)    TempSrc:  (S) Oral    SpO2:  96% 97% 98%  Weight: (!) 140.6 kg     Height: 6\' 2"  (1.88 m)      No intake or output data in the 24 hours ending 05/30/20 1055 Last 3 Weights 05/30/2020 03/12/2020 02/17/2020  Weight (lbs) 310 lb 317 lb 12.8 oz 320 lb 12.8 oz  Weight (kg) 140.615 kg 144.153 kg 145.514 kg     Body mass index is 39.8 kg/m.  General:  Obese male in mild distress Neck: no JVD Cardiac:  normal S1, S2; regular rhythm, tachycardic rate Lungs: respirations unlabored Skin: warm and dry  Neuro:  CNs 2-12 intact, no focal abnormalities noted Psych:  Normal affect    EKG:  The ECG that was done was personally reviewed and demonstrates sinus tachycardia HR 117 ST elevation inferior leads, V2-6, ST depression and TWI aVR  Relevant CV Studies:  Left heart cath pending  Laboratory Data:  High Sensitivity Troponin:  No results for input(s): TROPONINIHS in the last 720 hours.    ChemistryNo results for input(s): NA, K, CL, CO2, GLUCOSE, BUN, CREATININE, CALCIUM, GFRNONAA, GFRAA, ANIONGAP in the last 168 hours.  No results for input(s): PROT, ALBUMIN, AST, ALT, ALKPHOS, BILITOT in the last 168 hours. Hematology Recent Labs  Lab 05/30/20 0952  WBC 13.6*  RBC 5.35  HGB 16.2  HCT 49.7  MCV 92.9  MCH 30.3  MCHC 32.6  RDW 11.9  PLT 331   BNPNo results for input(s): BNP, PROBNP in the last 168 hours.  DDimer No  results for input(s): DDIMER in the last 168 hours.   Radiology/Studies:  No results found.     TIMI Risk Score for ST  Elevation MI:   The patient's TIMI risk score is 4, which indicates a 7.3% risk of all cause mortality at 30 days.       Assessment and Plan:   Anterolateral STEMI - pt reported intermittent pain all day yesterday, subsided at bedtime - woke up 5AM with crushing substernal chest pain - EKG on presentation with ST elevation V2-6 - anticipate starting BB and 81  mg ASA - obtain echo   Febrile  - fever in ER 101 - COVID swab pending - labs pending   Hypertension - pt sees PCP at [facility]  - home medications include HCTZ and losartan   Hyperlipidemia now with LDL goal < 70 - lipid panel pending - 10 mg crestor at home - allergic to lipitor - increase crestor to 40 mg   DM - on insulin at baseline - SSI with bedtime lantus 44 units - consider consult to diabetes educator - A1c pending   Pt examined by Dr. Tresa Endo.   Severity of Illness: The appropriate patient status for this patient is INPATIENT. Inpatient status is judged to be reasonable and necessary in order to provide the required intensity of service to ensure the patient's safety. The patient's presenting symptoms, physical exam findings, and initial radiographic and laboratory data in the context of their chronic comorbidities is felt to place them at high risk for further clinical deterioration. Furthermore, it is not anticipated that the patient will be medically stable for discharge from the hospital within 2 midnights of admission. The following factors support the patient status of inpatient.   " The patient's presenting symptoms include unstable angina. " The worrisome physical exam findings include STEMI. " The initial radiographic and laboratory data are worrisome because of STEMI. " The chronic co-morbidities include HTN, HLD, obesity.   * I certify that at the point of admission it  is my clinical judgment that the patient will require inpatient hospital care spanning beyond 2 midnights from the point of admission due to high intensity of service, high risk for further deterioration and high frequency of surveillance required.*    For questions or updates, please contact [facility] Please consult [email or web address] for contact info under     Signed, Marcelino Duster, PA  05/30/2020 10:55 AM   Patient seen and examined. Agree with assessment and plan.  [name] Merritt is a 49 year old African-American male who is followed by Dr. Trula Slade.  Patient has a history of diabetes mellitus, hypertension, hyperlipidemia with previous rosuvastatin intolerance, currently on atorvastatin as well as recent evaluation for sleep apnea but has not yet started therapy.  Developed new onset episodes of recurrent chest pain which persisted throughout a lot of the day.  Finally the chest discomfort eased.  At approximately 5 AM this morning he developed severe substernal chest pressure which did not resolve and was significantly more intense.  He presented to [facility] emergency room where ECG shows anterolateral ST segment elevation.  In the emergency room, the patient had low-grade fever.  He had received his Covid vaccinations.  He was given heparin 4000 units and morphine.  With ongoing 10 out of 10 chest pain he was brought urgently to the catheterization laboratory.  On exam, he was morbidly obese.  He had very thick neck.  Lungs were clear.  Rhythm was regular with no S3 gallop.  He had central adiposity.  Pulses were 2+.  There was no significant edema.  I reviewed the ECG with the patient as well as his wife.  I discussed the EKG findings highly suggestive of total occlusion of the LAD.  Plan emergent cardiac catheterization.  The risk/benefits of the procedure were discussed.   Lennette Bihari, MD, [facility] 05/30/2020 12:37 PM

## 2020-05-30 NOTE — ED Notes (Signed)
ACTIVATED CODE STEMI 

## 2020-05-30 NOTE — ED Provider Notes (Signed)
Emergency Department Provider Note   I have reviewed the triage vital signs and the nursing notes.   HISTORY  Chief Complaint Code STEMI   HPI [name] Merritt is a 49 y.o. male with a past medical history of elevated BMI, diabetes, high cholesterol, hypertension presents to the emergency department by private vehicle with chest pain starting last night.  The patient is having central to left-sided chest pressure radiating to the arm.  He is feeling very fatigued.  He denies any cough, congestion, fever symptoms.  Denies pleuritic pain.  No pain radiating to the back or abdomen.  No prior history of acute MI.  Does note a family history of CAD including his mother who was recently admitted for ACS.   Past Medical History:  Diagnosis Date  . Diabetes mellitus without complication (HCC)   . High cholesterol   . Hypertension   . Overweight     Patient Active Problem List   Diagnosis Date Noted  . ST elevation myocardial infarction involving left anterior descending (LAD) coronary artery (HCC)   . Angina pectoris (HCC) 11/24/2018    Past Surgical History:  Procedure Laterality Date  . CORONARY STENT INTERVENTION N/A 05/30/2020   Procedure: CORONARY STENT INTERVENTION;  Surgeon: Lennette Bihari, MD;  Location: [facility] INVASIVE CV LAB;  Service: Cardiovascular;  Laterality: N/A;  . CORONARY/GRAFT ACUTE MI REVASCULARIZATION N/A 05/30/2020   Procedure: Coronary/Graft Acute MI Revascularization;  Surgeon: Lennette Bihari, MD;  Location: [facility] INVASIVE CV LAB;  Service: Cardiovascular;  Laterality: N/A;  . LEFT HEART CATH AND CORONARY ANGIOGRAPHY N/A 05/30/2020   Procedure: LEFT HEART CATH AND CORONARY ANGIOGRAPHY;  Surgeon: Lennette Bihari, MD;  Location: [facility] INVASIVE CV LAB;  Service: Cardiovascular;  Laterality: N/A;  . torn rotator cuff  2018   2018    Allergies Pineapple, Tomato, and Rosuvastatin calcium  Family History  Problem Relation Age of Onset  . Hypertension Mother   . Diabetes  Mother   . Hypertension Father   . Diabetes Father   . Diabetes Maternal Grandmother   . Hypertension Maternal Grandmother   . Diabetes Maternal Grandfather   . Hypertension Maternal Grandfather   . Diabetes Paternal Grandmother   . Hypertension Paternal Grandmother   . Diabetes Paternal Grandfather   . Hypertension Paternal Grandfather   . Asthma Neg Hx     Social History Social History   Tobacco Use  . Smoking status: Never Smoker  . Smokeless tobacco: Former Neurosurgeon    Types: Engineer, drilling  . Vaping Use: Never assessed  Substance Use Topics  . Alcohol use: No  . Drug use: No    Review of Systems  Constitutional: No fever/chills Eyes: No visual changes. ENT: No sore throat. Cardiovascular: Positive chest pain. Respiratory: Denies shortness of breath. Gastrointestinal: No abdominal pain.  No nausea, no vomiting.  No diarrhea.  No constipation. Genitourinary: Negative for dysuria. Musculoskeletal: Negative for back pain. Skin: Negative for rash. Neurological: Negative for headaches, focal weakness or numbness.  10-point ROS otherwise negative.  ____________________________________________   PHYSICAL EXAM:  VITAL SIGNS: Vitals:   05/31/20 0600 05/31/20 0700  BP: (!) 141/82 105/62  Pulse: 87 91  Resp: (!) 24 16  Temp:    SpO2: 94% 96%    Constitutional: Alert and oriented. Appears unwell and weak.  Eyes: Conjunctivae are normal. PERRL. EOMI. Head: Atraumatic. Nose: No congestion/rhinnorhea. Mouth/Throat: Mucous membranes are moist.   Neck: No stridor.   Cardiovascular: Normal rate, regular rhythm.  Good peripheral circulation. Grossly normal heart sounds.   Respiratory: Normal respiratory effort.  No retractions. Lungs CTAB. Gastrointestinal: Soft and nontender. No distention.  Musculoskeletal: No lower extremity tenderness nor edema. No gross deformities of extremities. Neurologic:  Normal speech and language. No gross focal neurologic deficits are  appreciated.  Skin:  Skin is warm, dry and intact. No rash noted.  ____________________________________________   LABS (all labs ordered are listed, but only abnormal results are displayed)  Labs Reviewed  HEMOGLOBIN A1C - Abnormal; Notable for the following components:      Result Value   Hgb A1c MFr Bld 9.8 (*)    All other components within normal limits  CBC WITH DIFFERENTIAL/PLATELET - Abnormal; Notable for the following components:   WBC 13.6 (*)    Neutro Abs 9.8 (*)    Monocytes Absolute 1.3 (*)    All other components within normal limits  COMPREHENSIVE METABOLIC PANEL - Abnormal; Notable for the following components:   Sodium 131 (*)    Chloride 94 (*)    Glucose, Bld 462 (*)    Creatinine, Ser 1.30 (*)    AST 74 (*)    All other components within normal limits  LIPID PANEL - Abnormal; Notable for the following components:   Cholesterol 245 (*)    LDL Cholesterol 182 (*)    All other components within normal limits  BASIC METABOLIC PANEL - Abnormal; Notable for the following components:   Sodium 130 (*)    Chloride 95 (*)    Glucose, Bld 332 (*)    Calcium 8.7 (*)    All other components within normal limits  CBC - Abnormal; Notable for the following components:   WBC 16.0 (*)    All other components within normal limits  GLUCOSE, CAPILLARY - Abnormal; Notable for the following components:   Glucose-Capillary 380 (*)    All other components within normal limits  GLUCOSE, CAPILLARY - Abnormal; Notable for the following components:   Glucose-Capillary 327 (*)    All other components within normal limits  GLUCOSE, CAPILLARY - Abnormal; Notable for the following components:   Glucose-Capillary 267 (*)    All other components within normal limits  TROPONIN I (HIGH SENSITIVITY) - Abnormal; Notable for the following components:   Troponin I (High Sensitivity) 7,578 (*)    All other components within normal limits  SARS CORONAVIRUS 2 BY RT PCR (HOSPITAL ORDER,  PERFORMED IN [facility])  MRSA PCR SCREENING  PROTIME-INR  APTT   ____________________________________________  EKG   EKG Interpretation  Date/Time:  Sunday May 30 2020 09:51:26 EDT Ventricular Rate:  112 PR Interval:  156 QRS Duration: 79 QT Interval:  314 QTC Calculation: 429 R Axis:   -34 Text Interpretation: Sinus tachycardia Probable left atrial enlargement Left ventricular hypertrophy Anterolateral infarct, acute (LAD) >>> Acute MI <<< Confirmed by Alona Bene [phone]) on 05/30/2020 10:31:48 AM Also confirmed by Alona Bene [phone]), editor Alva Garnet [phone])  on 05/31/2020 7:44:29 AM       ____________________________________________  RADIOLOGY  CARDIAC CATHETERIZATION  Result Date: 05/30/2020  3rd Mrg lesion is 50% stenosed.  LPDA lesion is 70% stenosed.  LPAV-1 lesion is 30% stenosed.  LPAV-2 lesion is 30% stenosed.  1st Diag lesion is 70% stenosed.  Ramus lesion is 20% stenosed.  Mid LAD lesion is 100% stenosed.  Post intervention, there is a 0% residual stenosis.  A stent was successfully placed.  Acute anterolateral ST segment elevation myocardial infarction secondary to total  occlusion of the mid LAD with TIMI 0 flow and absence of collaterals. Concomitant CAD with tortuous 1st diagonal vessel with 70% narrowing beyond a sharp bend in the vessel, 20% narrowing beyond a sharp bend in the ramus immediate vessel, and dominant left circumflex coronary artery with 50% focal OM 3 stenosis, distal 30% stenoses in the AV groove, and focal 70% stenosis in the midportion of a small PLA branch.  Normal small nondominant RCA. LVEDP 12 millimeters Hg. Successful PCI to the totally occluded mid LAD with ultimate insertion of a Resolute Onyx 3.0 x 22 mm DES stent postdilated to 3.25 mm with digital narrowing of 0% and restoration of TIMI-3 flow. RECOMMENDATION: DAPT for minimum of 1 year.  Aggressive lipid-lowering therapy with titration of atorvastatin to 80 mg  daily.  We will reinitiate ARB therapy with losartan initially at lower dose, add low-dose beta-blocker therapy, and obtain 2D echo Doppler study in a.m. optimal blood pressure control with target blood pressure less than 130/80 and ideal blood pressure less than 120/80.  We will diabetes control.  If patient has LV dysfunction, SGLT 2 inhibition can be considered.   DG Chest Port 1 View  Result Date: 05/30/2020 CLINICAL DATA:  Chest pain, fever EXAM: PORTABLE CHEST 1 VIEW COMPARISON:  12/31/2019 FINDINGS: The heart size and mediastinal contours are within normal limits. Unchanged elevation of the right hemidiaphragm. No acute airspace opacity. The visualized skeletal structures are unremarkable. IMPRESSION: No acute abnormality of the lungs in portable projection. Unchanged elevation of the right hemidiaphragm. No acute airspace opacity. Electronically Signed   By: Lauralyn Primes M.D.   On: 05/30/2020 11:52    ____________________________________________   PROCEDURES  Procedure(s) performed:   Procedures  CRITICAL CARE Performed by: Maia Plan Total critical care time: 30 minutes Critical care time was exclusive of separately billable procedures and treating other patients. Critical care was necessary to treat or prevent imminent or life-threatening deterioration. Critical care was time spent personally by me on the following activities: development of treatment plan with patient and/or surrogate as well as nursing, discussions with consultants, evaluation of patient's response to treatment, examination of patient, obtaining history from patient or surrogate, ordering and performing treatments and interventions, ordering and review of laboratory studies, ordering and review of radiographic studies, pulse oximetry and re-evaluation of patient's condition.  Alona Bene, MD Emergency Medicine  ____________________________________________   INITIAL IMPRESSION / ASSESSMENT AND PLAN / ED  COURSE  Pertinent labs & imaging results that were available during my care of the patient were reviewed by me and considered in my medical decision making (see chart for details).   Called to triage to evaluate patient with chest pain.  EKG shows ST segment elevation diffusely with depressions in aVR.  Patient is having active chest pain and looks unwell.  Has multiple risk factors for ACS.  Lower suspicion clinically for PE.  Will send COVID-19 swab but patient is not having symptoms currently. Have activated CODE STEMI immediately upon my evaluation in triage.   Spoke with Dr. Tresa Endo with Cardiology. They will activate the cath lab.   10:10 AM  Alerted by nursing that repeat temp shows fever now.  Covid swab is pending along with labs. Has been placed on contact and airborne precautions. Differential includes COVID-19 with question of myocarditis. Plan remains for patient to go emergently to the cath lab with Cardiology.   Discussed patient's case with Cardiology to request admission. Patient and family (if present) updated with plan. Care  transferred to Cardiology service.  I reviewed all nursing notes, vitals, pertinent old records, EKGs, labs, imaging (as available).  ____________________________________________  FINAL CLINICAL IMPRESSION(S) / ED DIAGNOSES  Final diagnoses:  ST elevation myocardial infarction (STEMI), unspecified artery (HCC)     MEDICATIONS GIVEN DURING THIS VISIT:  Medications  0.9 %  sodium chloride infusion ( Intravenous Rate/Dose Change 05/30/20 1148)  nitroGLYCERIN (NITROSTAT) SL tablet 0.4 mg (0.4 mg Sublingual Given 05/30/20 1346)  heparin 5000 UNIT/ML injection (  Canceled Entry 05/30/20 1232)  acetaminophen (TYLENOL) tablet 1,000 mg ( Oral Canceled Entry 05/30/20 1231)  nitroGLYCERIN 50 mg in dextrose 5 % 250 mL (0.2 mg/mL) infusion (0 mcg/min Intravenous Hold 05/30/20 1232)  labetalol (NORMODYNE) injection 10 mg (10 mg Intravenous Given 05/30/20 1259)    hydrALAZINE (APRESOLINE) injection 10 mg (has no administration in time range)  acetaminophen (TYLENOL) tablet 650 mg (650 mg Oral Given 05/30/20 2230)  ondansetron (ZOFRAN) injection 4 mg (has no administration in time range)  enoxaparin (LOVENOX) injection 40 mg (has no administration in time range)  0.9 %  sodium chloride infusion ( Intravenous Stopped 05/30/20 2249)  sodium chloride flush (NS) 0.9 % injection 3 mL (has no administration in time range)  sodium chloride flush (NS) 0.9 % injection 3 mL (has no administration in time range)  0.9 %  sodium chloride infusion (has no administration in time range)  diazepam (VALIUM) tablet 5 mg (5 mg Oral Given 05/30/20 2111)  aspirin chewable tablet 81 mg (has no administration in time range)  ticagrelor (BRILINTA) tablet 90 mg (90 mg Oral Given 05/30/20 2111)  losartan (COZAAR) tablet 50 mg (has no administration in time range)  metoprolol tartrate (LOPRESSOR) tablet 12.5 mg (12.5 mg Oral Given 05/30/20 2111)  loratadine (CLARITIN) tablet 10 mg (has no administration in time range)  atorvastatin (LIPITOR) tablet 80 mg (80 mg Oral Given 05/30/20 1755)  insulin glargine (LANTUS) injection 44 Units (44 Units Subcutaneous Given 05/30/20 2111)  insulin aspart (novoLOG) injection 0-9 Units (9 Units Subcutaneous Given 05/30/20 1755)  insulin aspart (novoLOG) injection 0-5 Units (4 Units Subcutaneous Given 05/30/20 2230)  Chlorhexidine Gluconate Cloth 2 % PADS 6 each (has no administration in time range)  guaiFENesin tablet 400 mg (has no administration in time range)  benzonatate (TESSALON) capsule 100 mg (has no administration in time range)  aspirin chewable tablet 324 mg (324 mg Oral Given 05/30/20 0954)  heparin injection 4,000 Units (4,000 Units Intravenous Given 05/30/20 1002)  morphine 4 MG/ML injection 4 mg (4 mg Intravenous Given 05/30/20 1022)    Note:  This document was prepared using Dragon voice recognition software and may include unintentional dictation  errors.  Alona Bene, MD, [facility] Emergency Medicine    Jorryn Hershberger, Arlyss Repress, MD 05/31/20 [phone]

## 2020-05-31 ENCOUNTER — Other Ambulatory Visit (HOSPITAL_COMMUNITY): Payer: BC Managed Care – PPO

## 2020-05-31 ENCOUNTER — Encounter (HOSPITAL_COMMUNITY): Payer: Self-pay | Admitting: Cardiovascular Disease

## 2020-05-31 ENCOUNTER — Inpatient Hospital Stay (HOSPITAL_COMMUNITY): Payer: BC Managed Care – PPO

## 2020-05-31 DIAGNOSIS — I2510 Atherosclerotic heart disease of native coronary artery without angina pectoris: Secondary | ICD-10-CM

## 2020-05-31 DIAGNOSIS — E7800 Pure hypercholesterolemia, unspecified: Secondary | ICD-10-CM

## 2020-05-31 LAB — ECHOCARDIOGRAM COMPLETE
Area-P 1/2: 5.27 cm2
Height: 74 in
S' Lateral: 3.1 cm
Single Plane A4C EF: 43.6 %
Weight: 4960 oz

## 2020-05-31 LAB — CBC
HCT: 42 % (ref 39.0–52.0)
Hemoglobin: 13.7 g/dL (ref 13.0–17.0)
MCH: 29.9 pg (ref 26.0–34.0)
MCHC: 32.6 g/dL (ref 30.0–36.0)
MCV: 91.7 fL (ref 80.0–100.0)
Platelets: 280 10*3/uL (ref 150–400)
RBC: 4.58 MIL/uL (ref 4.22–5.81)
RDW: 11.9 % (ref 11.5–15.5)
WBC: 16 10*3/uL — ABNORMAL HIGH (ref 4.0–10.5)
nRBC: 0 % (ref 0.0–0.2)

## 2020-05-31 LAB — POCT ACTIVATED CLOTTING TIME
Activated Clotting Time: 201 seconds
Activated Clotting Time: 433 seconds
Activated Clotting Time: 279 seconds

## 2020-05-31 LAB — BASIC METABOLIC PANEL
Anion gap: 11 (ref 5–15)
BUN: 18 mg/dL (ref 6–20)
CO2: 24 mmol/L (ref 22–32)
Calcium: 8.7 mg/dL — ABNORMAL LOW (ref 8.9–10.3)
Chloride: 95 mmol/L — ABNORMAL LOW (ref 98–111)
Creatinine, Ser: 1.23 mg/dL (ref 0.61–1.24)
GFR calc Af Amer: >60 mL/min (ref >60)
GFR calc non Af Amer: >60 mL/min (ref >60)
Glucose, Bld: 332 mg/dL — ABNORMAL HIGH (ref 70–99)
Potassium: 4.1 mmol/L (ref 3.5–5.1)
Sodium: 130 mmol/L — ABNORMAL LOW (ref 135–145)

## 2020-05-31 LAB — URINALYSIS, COMPLETE (UACMP) WITH MICROSCOPIC
Bacteria, UA: NONE SEEN
Bilirubin Urine: NEGATIVE
Glucose, UA: >=500 mg/dL — AB
Ketones, ur: NEGATIVE mg/dL
Leukocytes,Ua: NEGATIVE
Nitrite: NEGATIVE
Protein, ur: 30 mg/dL — AB
Specific Gravity, Urine: 1.033 — ABNORMAL HIGH (ref 1.005–1.030)
pH: 5 (ref 5.0–8.0)

## 2020-05-31 LAB — POCT I-STAT, CHEM 8
BUN: 19 mg/dL (ref 6–20)
Calcium, Ion: 1.14 mmol/L — ABNORMAL LOW (ref 1.15–1.40)
Chloride: 95 mmol/L — ABNORMAL LOW (ref 98–111)
Creatinine, Ser: 1.1 mg/dL (ref 0.61–1.24)
Glucose, Bld: 453 mg/dL — ABNORMAL HIGH (ref 70–99)
HCT: 46 % (ref 39.0–52.0)
Hemoglobin: 15.6 g/dL (ref 13.0–17.0)
Potassium: 4.1 mmol/L (ref 3.5–5.1)
Sodium: 135 mmol/L (ref 135–145)
TCO2: 29 mmol/L (ref 22–32)

## 2020-05-31 LAB — GLUCOSE, CAPILLARY
Glucose-Capillary: 267 mg/dL — ABNORMAL HIGH (ref 70–99)
Glucose-Capillary: 344 mg/dL — ABNORMAL HIGH (ref 70–99)
Glucose-Capillary: 262 mg/dL — ABNORMAL HIGH (ref 70–99)
Glucose-Capillary: 278 mg/dL — ABNORMAL HIGH (ref 70–99)

## 2020-05-31 MED ORDER — INSULIN ASPART 100 UNIT/ML ~~LOC~~ SOLN
0.0000 [IU] | Freq: Three times a day (TID) | SUBCUTANEOUS | Status: DC
  Administered 2020-06-01: 3 [IU] via SUBCUTANEOUS

## 2020-05-31 MED ORDER — INSULIN GLARGINE 100 UNIT/ML ~~LOC~~ SOLN
50.0000 [IU] | Freq: Every day | SUBCUTANEOUS | Status: DC
  Administered 2020-05-31: 50 [IU] via SUBCUTANEOUS
  Filled 2020-05-31 (×2): qty 0.5

## 2020-05-31 MED ORDER — INSULIN ASPART 100 UNIT/ML ~~LOC~~ SOLN
6.0000 [IU] | Freq: Three times a day (TID) | SUBCUTANEOUS | Status: DC
  Administered 2020-05-31: 6 [IU] via SUBCUTANEOUS

## 2020-05-31 MED ORDER — INSULIN ASPART 100 UNIT/ML ~~LOC~~ SOLN
6.0000 [IU] | Freq: Three times a day (TID) | SUBCUTANEOUS | Status: DC
  Administered 2020-06-01: 6 [IU] via SUBCUTANEOUS

## 2020-05-31 MED ORDER — PERFLUTREN LIPID MICROSPHERE
1.0000 mL | INTRAVENOUS | Status: DC | PRN
  Administered 2020-05-31: 3 mL via INTRAVENOUS
  Filled 2020-05-31: qty 10

## 2020-05-31 MED ORDER — CARVEDILOL 6.25 MG PO TABS
6.2500 mg | ORAL_TABLET | Freq: Two times a day (BID) | ORAL | Status: DC
  Administered 2020-05-31 – 2020-06-01 (×3): 6.25 mg via ORAL
  Filled 2020-05-31 (×3): qty 1

## 2020-05-31 NOTE — TOC Initial Note (Signed)
Transition of Care [facility]) - Initial/Assessment Note    Patient Details  Name: [name] Merritt MRN: [id number] Date of Birth: [birth year]  [facility] CM/SW Contact:    Curlene Labrum, RN Phone Number: 05/31/2020, 10:58 AM  Clinical Narrative:                 Case management met with the patient and wife at the bedside concerning transitions to home.  The patient lives with his wife, who works in Programmer, multimedia at [employer].  The patient is S/P PCI after admission for CP and sent to cath lab accordingly.  The patient does not smoke and uses alcohol only socially.  The patient was registered for [employer] savings program and benefits check noted in the progress notes from [place].  The patient and wife given Brilinta Savings card information and education regarding his medication cost and usage.  Will continue to follow and wife states that patient will be transferred to the telemetry floor today.  Expected Discharge Plan: Home/Self Care Barriers to Discharge: Continued Medical Work up   Patient Goals and CMS Choice Patient states their goals for this hospitalization and ongoing recovery are:: Patient plans to discharge to home with his wife, who works at [employer] in imaging. CMS Medicare.gov Compare Post Acute Care list provided to:: Patient Choice offered to / list presented to : Patient  Expected Discharge Plan and Services Expected Discharge Plan: Home/Self Care   Discharge Planning Services: CM Consult Post Acute Care Choice: NA Living arrangements for the past 2 months: Single Family Home                                      Prior Living Arrangements/Services Living arrangements for the past 2 months: Single Family Home Lives with:: Spouse Patient language and need for interpreter reviewed:: Yes Do you feel safe going back to the place where you live?: Yes      Need for Family Participation in Patient Care: Yes (Comment) Care giver support system in  place?: Yes (comment)   Criminal Activity/Legal Involvement Pertinent to Current Situation/Hospitalization: No - Comment as needed  Activities of Daily Living Home Assistive Devices/Equipment: None ADL Screening (condition at time of admission) Patient's cognitive ability adequate to safely complete daily activities?: Yes Is the patient deaf or have difficulty hearing?: No Does the patient have difficulty seeing, even when wearing glasses/contacts?: No Does the patient have difficulty concentrating, remembering, or making decisions?: No Patient able to express need for assistance with ADLs?: Yes Does the patient have difficulty dressing or bathing?: No Independently performs ADLs?: Yes (appropriate for developmental age) Does the patient have difficulty walking or climbing stairs?: No Weakness of Legs: None Weakness of Arms/Hands: None  Permission Sought/Granted Permission sought to share information with : Case Manager Permission granted to share information with : Yes, Verbal Permission Granted     Permission granted to share info w AGENCY: Pharmacy co-pay for Bril[employer] savings card  Permission granted to share info w Relationship: Renee - wife     Emotional Assessment Appearance:: Appears stated age Attitude/Demeanor/Rapport: Gracious Affect (typically observed): Accepting Orientation: : Oriented to Self, Oriented to Place, Oriented to  Time, Oriented to Situation Alcohol / Substance Use: Other (comment), Alcohol Use (Patient states uses alcohol only socially - occasional) Psych Involvement: No (comment)  Admission diagnosis:  ST elevation myocardial infarction (STEMI), unspecified artery ([place]) [I21.3] ST  elevation myocardial infarction involving left anterior descending (LAD) coronary artery (HCC) [I21.02] Patient Active Problem List   Diagnosis Date Noted   ST elevation myocardial infarction involving left anterior descending (LAD) coronary artery (HCC)    Angina pectoris  ([place]) 11/24/2018   PCP:  Seward Carol, MD Pharmacy:   [facility] [place], [address] [place] [address] Phone: [phone] Fax: [phone]     Social Determinants of Health (SDOH) Interventions    Readmission Risk Interventions Readmission Risk Prevention Plan 05/31/2020  Post Dischage Appt Complete  Medication Screening Complete  Transportation Screening Complete  Some recent data might be hidden

## 2020-05-31 NOTE — Progress Notes (Signed)
CARDIAC REHAB PHASE I   PRE:  Rate/Rhythm: 74 SR  BP:  Supine: 107/73  Sitting:   Standing:    SaO2: 97%RA  MODE:  Ambulation: 470 ft   POST:  Rate/Rhythm: 81 SR     SaO2: 99%RA 1345-1505 Pt walked 470 ft on RA with steady gait and no CP. Tolerated well. MI education completed with pt and wife who voiced understanding. Stressed importance of brilinta with stent, reviewed NTG use, MI restrictions, carb counting and heart healthy food choices, and walking for ex. Discussed CRP 2 and referred to [facility] program.    Luetta Nutting, RN BSN  05/31/2020 3:01 PM

## 2020-05-31 NOTE — Progress Notes (Signed)
EKG CRITICAL VALUE     12 lead EKG performed.  Critical value noted. Jacquelyne Balint, RN notified.   Alto Denver, CCT 05/31/2020 7:27 AM

## 2020-05-31 NOTE — Progress Notes (Signed)
  Echocardiogram 2D Echocardiogram has been performed.  [name] Merritt 05/31/2020, 3:45 PM

## 2020-05-31 NOTE — TOC Benefit Eligibility Note (Signed)
Transition of Care [facility]) Benefit Eligibility Note    Patient Details  Name: [name] Merritt MRN: [id number] Date of Birth: Nov 29, 1970   Medication/Dose: Marden Noble  90mg  BID for 30 day supply  Covered?: Yes  Tier: 2 Drug  Prescription Coverage Preferred Pharmacy: with Person/Company/Phone Number:: [phone] [facility] Help Des[facility] PH# [phone]  Co-Pay: $35.00  Prior Approval: No  Deductible:  (No Deductible)       [phone] Phone Number: 05/31/2020, 9:50 AM

## 2020-05-31 NOTE — Progress Notes (Signed)
Progress Note  Patient Name: [name] Merritt Date of Encounter: 05/31/2020  [facility] Cardiologist: Nicki Guadalajara, MD   Subjective   States he has a slight mid sternal chest pain. No dyspnea. Has a dry cough that he states he has had for several months.   Inpatient Medications    Scheduled Meds:  acetaminophen  1,000 mg Oral Once   aspirin  81 mg Oral Daily   atorvastatin  80 mg Oral q1800   Chlorhexidine Gluconate Cloth  6 each Topical Q0600   enoxaparin (LOVENOX) injection  40 mg Subcutaneous Q24H   insulin aspart  0-5 Units Subcutaneous QHS   insulin aspart  0-9 Units Subcutaneous TID WC   insulin glargine  44 Units Subcutaneous QHS   loratadine  10 mg Oral Daily   losartan  50 mg Oral Daily   metoprolol tartrate  12.5 mg Oral BID   sodium chloride flush  3 mL Intravenous Q12H   ticagrelor  90 mg Oral BID   Continuous Infusions:  sodium chloride 150 mL/hr at 05/30/20 1148   sodium chloride Stopped (05/30/20 2249)   sodium chloride     nitroGLYCERIN Stopped (05/30/20 1232)   PRN Meds: sodium chloride, acetaminophen, benzonatate, diazepam, guaiFENesin, nitroGLYCERIN, ondansetron (ZOFRAN) IV, sodium chloride flush   Vital Signs    Vitals:   05/31/20 0400 05/31/20 0500 05/31/20 0600 05/31/20 0700  BP: 130/81 (!) 141/83 (!) 141/82 105/62  Pulse: 86 88 87 91  Resp: (!) 30 (!) 28 (!) 24 16  Temp:      TempSrc:      SpO2: 97% 95% 94% 96%  Weight:      Height:        Intake/Output Summary (Last 24 hours) at 05/31/2020 0750 Last data filed at 05/31/2020 0300 Gross per 24 hour  Intake 2808.22 ml  Output 600 ml  Net 2208.22 ml   Last 3 Weights 05/30/2020 03/12/2020 02/17/2020  Weight (lbs) 310 lb 317 lb 12.8 oz 320 lb 12.8 oz  Weight (kg) 140.615 kg 144.153 kg 145.514 kg      Telemetry    NSR, few PVCs, couplets.  - Personally Reviewed  ECG    NSR  With persistent ST elevation in leads 2,3, aVF, V2-6 with Q waves. - Personally  Reviewed  Physical Exam   GEN: No acute distress.  Coughing.  Neck: No JVD Cardiac: RRR, no murmurs, rubs, or gallops.  Respiratory: Clear to auscultation bilaterally. GI: Soft, nontender, non-distended  MS: No edema; No deformity. Neuro:  Nonfocal  Psych: Normal affect   Labs    High Sensitivity Troponin:   Recent Labs  Lab 05/30/20 0952  TROPONINIHS 7,578*      Chemistry Recent Labs  Lab 05/30/20 0952 05/31/20 0106  NA 131* 130*  K 4.4 4.1  CL 94* 95*  CO2 23 24  GLUCOSE 462* 332*  BUN 16 18  CREATININE 1.30* 1.23  CALCIUM 9.4 8.7*  PROT 8.0  --   ALBUMIN 3.7  --   AST 74*  --   ALT 31  --   ALKPHOS 62  --   BILITOT 1.1  --   GFRNONAA >60 >60  GFRAA >60 >60  ANIONGAP 14 11     Hematology Recent Labs  Lab 05/30/20 0952 05/31/20 0106  WBC 13.6* 16.0*  RBC 5.35 4.58  HGB 16.2 13.7  HCT 49.7 42.0  MCV 92.9 91.7  MCH 30.3 29.9  MCHC 32.6 32.6  RDW 11.9 11.9  PLT 331  280    BNPNo results for input(s): BNP, PROBNP in the last 168 hours.   DDimer No results for input(s): DDIMER in the last 168 hours.   Radiology    CARDIAC CATHETERIZATION  Result Date: 05/30/2020  3rd Mrg lesion is 50% stenosed.  LPDA lesion is 70% stenosed.  LPAV-1 lesion is 30% stenosed.  LPAV-2 lesion is 30% stenosed.  1st Diag lesion is 70% stenosed.  Ramus lesion is 20% stenosed.  Mid LAD lesion is 100% stenosed.  Post intervention, there is a 0% residual stenosis.  A stent was successfully placed.  Acute anterolateral ST segment elevation myocardial infarction secondary to total occlusion of the mid LAD with TIMI 0 flow and absence of collaterals. Concomitant CAD with tortuous 1st diagonal vessel with 70% narrowing beyond a sharp bend in the vessel, 20% narrowing beyond a sharp bend in the ramus immediate vessel, and dominant left circumflex coronary artery with 50% focal OM 3 stenosis, distal 30% stenoses in the AV groove, and focal 70% stenosis in the midportion of a  small PLA branch.  Normal small nondominant RCA. LVEDP 12 millimeters Hg. Successful PCI to the totally occluded mid LAD with ultimate insertion of a Resolute Onyx 3.0 x 22 mm DES stent postdilated to 3.25 mm with digital narrowing of 0% and restoration of TIMI-3 flow. RECOMMENDATION: DAPT for minimum of 1 year.  Aggressive lipid-lowering therapy with titration of atorvastatin to 80 mg daily.  We will reinitiate ARB therapy with losartan initially at lower dose, add low-dose beta-blocker therapy, and obtain 2D echo Doppler study in a.m. optimal blood pressure control with target blood pressure less than 130/80 and ideal blood pressure less than 120/80.  We will diabetes control.  If patient has LV dysfunction, SGLT 2 inhibition can be considered.   DG Chest Port 1 View  Result Date: 05/30/2020 CLINICAL DATA:  Chest pain, fever EXAM: PORTABLE CHEST 1 VIEW COMPARISON:  12/31/2019 FINDINGS: The heart size and mediastinal contours are within normal limits. Unchanged elevation of the right hemidiaphragm. No acute airspace opacity. The visualized skeletal structures are unremarkable. IMPRESSION: No acute abnormality of the lungs in portable projection. Unchanged elevation of the right hemidiaphragm. No acute airspace opacity. Electronically Signed   By: Lauralyn Primes M.D.   On: 05/30/2020 11:52    Cardiac Studies   Coronary/Graft Acute MI Revascularization  CORONARY STENT INTERVENTION  LEFT HEART CATH AND CORONARY ANGIOGRAPHY  Conclusion    3rd Mrg lesion is 50% stenosed.  LPDA lesion is 70% stenosed.  LPAV-1 lesion is 30% stenosed.  LPAV-2 lesion is 30% stenosed.  1st Diag lesion is 70% stenosed.  Ramus lesion is 20% stenosed.  Mid LAD lesion is 100% stenosed.  Post intervention, there is a 0% residual stenosis.  A stent was successfully placed.   Acute anterolateral ST segment elevation myocardial infarction secondary to total occlusion of the mid LAD with TIMI 0 flow and absence of  collaterals.  Concomitant CAD with tortuous 1st diagonal vessel with 70% narrowing beyond a sharp bend in the vessel, 20% narrowing beyond a sharp bend in the ramus immediate vessel, and dominant left circumflex coronary artery with 50% focal OM 3 stenosis, distal 30% stenoses in the AV groove, and focal 70% stenosis in the midportion of a small PLA branch.  Normal small nondominant RCA.  LVEDP 12 millimeters Hg.   Successful PCI to the totally occluded mid LAD with ultimate insertion of a Resolute Onyx 3.0 x 22 mm DES stent postdilated to 3.25  mm with digital narrowing of 0% and restoration of TIMI-3 flow.  RECOMMENDATION: DAPT for minimum of 1 year.  Aggressive lipid-lowering therapy with titration of atorvastatin to 80 mg daily.  We will reinitiate ARB therapy with losartan initially at lower dose, add low-dose beta-blocker therapy, and obtain 2D echo Doppler study in a.m. optimal blood pressure control with target blood pressure less than 130/80 and ideal blood pressure less than 120/80.  We will diabetes control.  If patient has LV dysfunction, SGLT 2 inhibition can be considered.    Patient Profile     49 y.o. male with history of poorly controlled DM, HTN, HLD and family history of CAD presents late in the course of an anterior STEMI.   Assessment & Plan    1. Acute anterior STEMI. Patient presented late. Starting having symptoms Friday evening. Had pain, N/V throughout the day Saturday. Pain intensified Sunday am and he presented to ED by private vehicle. Taken emergently to the cath lab. Found to have occluded LAD which was successfully stented with DES. Residual 50% stenosis in OM and 70% stenosis in left PDA. Will manage residual disease medically. On DAPT with ASA and Brilinta. Will need to take for one year. Echo pending today to assess LV function. Initiated on losartan. Will switch metoprolol to Coreg. High dose statin therapy.   2. Fever. Temp up to 102.3 last night. Now  100.7.  Has dry cough that is chronic. CXR yesterday was clear. No urinary complaints. WBC 16K. Suspect fever related to acute infarct. Will monitor fever curve. Check UA. Check blood cultures.   3. DM on insulin with poor control. Hyperglycemia. A1c 9.8% which patient reports has been 11%. On Lantus insulin. SSI. May need to consider adding [place] or Jardiance.   4. Hypercholesterolemia. LDL 182. Now on high dose lipitor 80 mg daily. Goal LDL < 70.   5. HTN. On HCTZ at home. Will switch to Coreg and ARB for now. May need to consider Entresto if EF low as expected. EDP normal at time of cath.   6. Hyponatremia. Due to HCTZ and hyperglycemia.     For questions or updates, please contact [facility] Please consult [email or web address] for contact info under        Signed, Derek Laughter [place], MD  05/31/2020, 7:50 AM

## 2020-05-31 NOTE — Progress Notes (Signed)
Diabetes coordinator reached out to relay new recs available in chart re: insulin regimen - reviewed and have made changes to orders. Adron Geisel PA-C

## 2020-05-31 NOTE — Progress Notes (Addendum)
Inpatient Diabetes Program Recommendations  AACE/ADA: New Consensus Statement on Inpatient Glycemic Control (2015)  Target Ranges:  Prepandial:   less than 140 mg/dL      Peak postprandial:   less than 180 mg/dL (1-2 hours)      Critically ill patients:  140 - 180 mg/dL   Results for [name] Merritt, [name] Merritt (MRN [id number]) as of 05/31/2020 08:21  Ref. Range 05/30/2020 17:21 05/30/2020 22:21 05/31/2020 06:53  Glucose-Capillary Latest Ref Range: 70 - 99 mg/dL 474 (H)  9 units NOVOLOG  327 (H)  4 units NOVOLOG +  44 units LANTUS 267 (H)  5 units NOVOLOG    Results for [name] Merritt, [name] Merritt (MRN [id number]) as of 05/31/2020 08:21  Ref. Range 12/12/2019 00:00 05/30/2020 09:52  Hemoglobin A1C Latest Ref Range: 4.8 - 5.6 % 9.6 9.8 (H)  (234 mg/dl)    Admit: STEMI  History: DM  Home DM Meds: Lantus 40 units Daily  Current Orders: Lantus 44 units QHS      Novolog Sensitive Correction Scale/ SSI (0-9 units) TID AC + HS   PCP: Dr. Windy Fast Polite--Last seen??   MD- Note Lantus and Novolog both started last PM.  CBG elevated to 267 this AM.  Please consider the following In-hospital insulin adjustments:  1. Increase the Lantus to 50 units QHS  2. Start Novolog Meal Coverage: Novolog 6 units TID with meals  (Please add the following Hold Parameters: Hold if pt eats <50% of meal, Hold if pt NPO)   Likely needs home Diabetes medication adjustment.  May need more Lantus at home (currently only taking 40 units Daily) and may benefit from addition of Jardiance per Cardiology notes.  Pt also requests new CBG meter at time of d/c home. Order # [id number]    Addendum 1:40pm--Spoke with pt and his wife by cell phone this AM (pt and wife placed on speaker together).  Reviewed current A1c of 9.8% with pt and wife--Reminded what an A1c is and what it measures--Discussed goal A1c and the importance of better blood sugar control at home to prevent further cardiac events and further complications from  diabetes.  Also reviewed blood sugar goals with pt and asked pt to start checking CBGs at least TID at home (either before or 2 hours after meals) and to record all data for next MD visit.  Pt's wife told me they had an appointment at [facility] for this week but had to cancel b/c pt has been hospitalized.  Pt has been under a lot of stress the last few weeks per wife (pt's mother suffered massive MI 13 days ago).  Pt's wife told me they re-scheduled the ENDO appt for the beginning of October and have asked the ENDO office to call if any appts become available sooner.  Told pt and wife I will ask the Cards team to give pt a Rx for new CBG meter at time of d/c.  If pt does not get a Rx, encouraged pt and wife to purchase a meter and supplies OTC at [facility] and to begin checking CBGs at home so that he will have CBG data to take to his ENDO appt.  Also discussed w/ pt and wife that we currently giving pt his home Lantus (4 units more thanhe takes at home) and we are also giving Novolog SSI.  Explained what Novolog is and how it works and how we are giving it to him.  Also explained to pt and wife that Dr. Elvis Coil note mentioned the possibility  of adding [place] or Jardiance to home diabetes meds.  Explained what these meds are and how they work and how they have added cardiac protection.    Gave pt and wife the opportunity to ask questions and they did not have any at this time.  Were appreciative of my call and stated they look forward to having pt go to the ENDO appt in October.    --Will follow patient during hospitalization--  Ambrose Finland RN, MSN, CDE Diabetes Coordinator Inpatient Glycemic Control Team Team Pager: [phone] (8a-5p)

## 2020-06-01 ENCOUNTER — Ambulatory Visit: Payer: BC Managed Care – PPO | Admitting: Primary Care

## 2020-06-01 ENCOUNTER — Other Ambulatory Visit (HOSPITAL_COMMUNITY): Payer: Self-pay | Admitting: Medical

## 2020-06-01 ENCOUNTER — Telehealth: Payer: Self-pay | Admitting: Student

## 2020-06-01 DIAGNOSIS — G4733 Obstructive sleep apnea (adult) (pediatric): Secondary | ICD-10-CM

## 2020-06-01 DIAGNOSIS — I10 Essential (primary) hypertension: Secondary | ICD-10-CM

## 2020-06-01 DIAGNOSIS — E785 Hyperlipidemia, unspecified: Secondary | ICD-10-CM

## 2020-06-01 DIAGNOSIS — E119 Type 2 diabetes mellitus without complications: Secondary | ICD-10-CM

## 2020-06-01 DIAGNOSIS — E1165 Type 2 diabetes mellitus with hyperglycemia: Secondary | ICD-10-CM

## 2020-06-01 DIAGNOSIS — I255 Ischemic cardiomyopathy: Secondary | ICD-10-CM

## 2020-06-01 LAB — BASIC METABOLIC PANEL
Anion gap: 12 (ref 5–15)
BUN: 21 mg/dL — ABNORMAL HIGH (ref 6–20)
CO2: 26 mmol/L (ref 22–32)
Calcium: 8.8 mg/dL — ABNORMAL LOW (ref 8.9–10.3)
Chloride: 96 mmol/L — ABNORMAL LOW (ref 98–111)
Creatinine, Ser: 1.28 mg/dL — ABNORMAL HIGH (ref 0.61–1.24)
GFR calc Af Amer: >60 mL/min (ref >60)
GFR calc non Af Amer: >60 mL/min (ref >60)
Glucose, Bld: 219 mg/dL — ABNORMAL HIGH (ref 70–99)
Potassium: 4.1 mmol/L (ref 3.5–5.1)
Sodium: 134 mmol/L — ABNORMAL LOW (ref 135–145)

## 2020-06-01 LAB — CBC
HCT: 41 % (ref 39.0–52.0)
Hemoglobin: 13.6 g/dL (ref 13.0–17.0)
MCH: 31 pg (ref 26.0–34.0)
MCHC: 33.2 g/dL (ref 30.0–36.0)
MCV: 93.4 fL (ref 80.0–100.0)
Platelets: 310 10*3/uL (ref 150–400)
RBC: 4.39 MIL/uL (ref 4.22–5.81)
RDW: 11.6 % (ref 11.5–15.5)
WBC: 10.8 10*3/uL — ABNORMAL HIGH (ref 4.0–10.5)
nRBC: 0 % (ref 0.0–0.2)

## 2020-06-01 LAB — GLUCOSE, CAPILLARY
Glucose-Capillary: 215 mg/dL — ABNORMAL HIGH (ref 70–99)
Glucose-Capillary: 184 mg/dL — ABNORMAL HIGH (ref 70–99)

## 2020-06-01 MED ORDER — NITROGLYCERIN 0.4 MG SL SUBL: 0.4000 mg | SUBLINGUAL_TABLET | SUBLINGUAL | 3 refills | Status: DC | PRN

## 2020-06-01 MED ORDER — ATORVASTATIN CALCIUM 80 MG PO TABS: 80.0000 mg | ORAL_TABLET | Freq: Every day | ORAL | 3 refills | Status: DC

## 2020-06-01 MED ORDER — STUDY - AEGIS II STUDY - PLACEBO OR CSL112 (PI-HILTY)
170.0000 mL | Freq: Once | INTRAVENOUS | Status: AC
  Administered 2020-06-01: 170 mL via INTRAVENOUS
  Filled 2020-06-01: qty 170

## 2020-06-01 MED ORDER — LIVING BETTER WITH HEART FAILURE BOOK: Freq: Once | Status: DC

## 2020-06-01 MED ORDER — SACUBITRIL-VALSARTAN 24-26 MG PO TABS
1.0000 | ORAL_TABLET | Freq: Two times a day (BID) | ORAL | Status: DC
  Administered 2020-06-01: 1 via ORAL
  Filled 2020-06-01: qty 1

## 2020-06-01 MED ORDER — DAPAGLIFLOZIN PROPANEDIOL 5 MG PO TABS: 5.0000 mg | ORAL_TABLET | Freq: Every day | ORAL | 3 refills | Status: DC

## 2020-06-01 MED ORDER — CARVEDILOL 6.25 MG PO TABS: 6.2500 mg | ORAL_TABLET | Freq: Two times a day (BID) | ORAL | 3 refills | Status: DC

## 2020-06-01 MED ORDER — DAPAGLIFLOZIN PROPANEDIOL 5 MG PO TABS
5.0000 mg | ORAL_TABLET | Freq: Every day | ORAL | Status: DC
  Administered 2020-06-01: 5 mg via ORAL
  Filled 2020-06-01: qty 1

## 2020-06-01 MED ORDER — TICAGRELOR 90 MG PO TABS: 90.0000 mg | ORAL_TABLET | Freq: Two times a day (BID) | ORAL | 3 refills | Status: DC

## 2020-06-01 MED ORDER — LANTUS SOLOSTAR 100 UNIT/ML ~~LOC~~ SOPN: 55.0000 [IU] | PEN_INJECTOR | Freq: Every day | SUBCUTANEOUS | 11 refills | Status: DC

## 2020-06-01 MED ORDER — SACUBITRIL-VALSARTAN 24-26 MG PO TABS: 1.0000 | ORAL_TABLET | Freq: Two times a day (BID) | ORAL | 3 refills | Status: DC

## 2020-06-01 MED ORDER — ASPIRIN 81 MG PO CHEW: 81.0000 mg | CHEWABLE_TABLET | Freq: Every day | ORAL | 3 refills | Status: DC

## 2020-06-01 MED ORDER — INSULIN GLARGINE 100 UNIT/ML ~~LOC~~ SOLN
55.0000 [IU] | Freq: Every day | SUBCUTANEOUS | Status: DC
  Filled 2020-06-01: qty 0.55

## 2020-06-01 MED FILL — ATORVASTATIN CALCIUM 80 MG: 80 | 30 days supply | Qty: 30 | Fill #0

## 2020-06-01 MED FILL — ASPIRIN LOW DOSE 81 MG CHEW: 81 | 90 days supply | Qty: 90 | Fill #0

## 2020-06-01 MED FILL — FARXIGA 5 MG TABLET: 5 | 30 days supply | Qty: 30 | Fill #0

## 2020-06-01 MED FILL — CARVEDILOL 6.25 MG TABLET: 6.25 | 30 days supply | Qty: 60 | Fill #0

## 2020-06-01 MED FILL — BRILINTA 90 MG TABLET: 90 | 30 days supply | Qty: 60 | Fill #0

## 2020-06-01 MED FILL — NITROGLYCERIN 0.4 MG TAB SL: 0.4 | 8 days supply | Qty: 25 | Fill #0

## 2020-06-01 MED FILL — ENTRESTO 24 MG-26 MG TABLET: 24-26 | 30 days supply | Qty: 60 | Fill #0

## 2020-06-01 NOTE — Telephone Encounter (Signed)
    Pt is scheduled for TOC with Marjie Skiff on 06/21/2020 at 11:15am. Scheduled by Judy Pimple

## 2020-06-01 NOTE — Progress Notes (Addendum)
Progress Note  Patient Name: [name] Merritt Date of Encounter: 06/01/2020  Primary Cardiologist: Nicki Guadalajara, MD  Subjective   Reports he's feeling better this morning but does report some pink urine and clots out of his nose. No SOB. Intermittent chest tightness but overall improved. Ambulated yesterday without acute issue.  Inpatient Medications    Scheduled Meds: . aspirin  81 mg Oral Daily  . atorvastatin  80 mg Oral q1800  . carvedilol  6.25 mg Oral BID WC  . enoxaparin (LOVENOX) injection  40 mg Subcutaneous Q24H  . insulin aspart  0-5 Units Subcutaneous QHS  . insulin aspart  0-9 Units Subcutaneous TID WC  . insulin aspart  6 Units Subcutaneous TID WC  . insulin glargine  50 Units Subcutaneous QHS  . loratadine  10 mg Oral Daily  . losartan  50 mg Oral Daily  . sodium chloride flush  3 mL Intravenous Q12H  . ticagrelor  90 mg Oral BID   Continuous Infusions: . sodium chloride 150 mL/hr at 05/30/20 1148  . sodium chloride     PRN Meds: sodium chloride, acetaminophen, benzonatate, diazepam, guaiFENesin, nitroGLYCERIN, ondansetron (ZOFRAN) IV, sodium chloride flush   Vital Signs    Vitals:   06/01/20 0019 06/01/20 0519 06/01/20 0520 06/01/20 0746  BP: 129/72  138/88 125/84  Pulse: 81  84 82  Resp: Temp: 99.5 F (37.5 C)  99 F (37.2 C) 98.7 F (37.1 C)  TempSrc:   Oral Oral  SpO2: 96%  96% 95%  Weight:  (!) 138.8 kg    Height:        Intake/Output Summary (Last 24 hours) at 06/01/2020 0931 Last data filed at 06/01/2020 0852 Gross per 24 hour  Intake 820 ml  Output 650 ml  Net 170 ml   Last 3 Weights 06/01/2020 05/30/2020 03/12/2020  Weight (lbs) 306 lb 1.6 oz 310 lb 317 lb 12.8 oz  Weight (kg) 138.846 kg 140.615 kg 144.153 kg     Telemetry    NSR - Personally Reviewed  Physical Exam   GEN: No acute distress. Morbid obesity. HEENT: Normocephalic, atraumatic, sclera non-icteric. Neck: No JVD or bruits. Cardiac: RRR no murmurs, rubs,  or gallops.  Radials/DP/PT 1+ and equal bilaterally.  Respiratory: Clear to auscultation bilaterally. Breathing is unlabored. GI: Soft, nontender, non-distended, BS +x 4. MS: no deformity. Extremities: No clubbing or cyanosis. No edema. Distal pedal pulses are 2+ and equal bilaterally. Neuro:  AAOx3. Follows commands. Psych:  Responds to questions appropriately with a normal affect.  Labs    High Sensitivity Troponin:   Recent Labs  Lab 05/30/20 0952  TROPONINIHS 7,578*      Cardiac EnzymesNo results for input(s): TROPONINI in the last 168 hours. No results for input(s): TROPIPOC in the last 168 hours.   Chemistry Recent Labs  Lab 05/30/20 0952 05/30/20 1102 05/31/20 0106  NA 131* 135 130*  K 4.4 4.1 4.1  CL 94* 95* 95*  CO2 23  --  24  GLUCOSE 462* 453* 332*  BUN CREATININE 1.30* 1.10 1.23  CALCIUM 9.4  --  8.7*  PROT 8.0  --   --   ALBUMIN 3.7  --   --   AST 74*  --   --   ALT 31  --   --   ALKPHOS 62  --   --   BILITOT 1.1  --   --   GFRNONAA >60  --  >  60  GFRAA >60  --  >60  ANIONGAP 14  --  11     Hematology Recent Labs  Lab 05/30/20 0952 05/30/20 1102 05/31/20 0106  WBC 13.6*  --  16.0*  RBC 5.35  --  4.58  HGB 16.2 15.6 13.7  HCT 49.7 46.0 42.0  MCV 92.9  --  91.7  MCH 30.3  --  29.9  MCHC 32.6  --  32.6  RDW 11.9  --  11.9  PLT 331  --  280    BNPNo results for input(s): BNP, PROBNP in the last 168 hours.   DDimer No results for input(s): DDIMER in the last 168 hours.   Radiology    CARDIAC CATHETERIZATION  Result Date: 05/30/2020  3rd Mrg lesion is 50% stenosed.  LPDA lesion is 70% stenosed.  LPAV-1 lesion is 30% stenosed.  LPAV-2 lesion is 30% stenosed.  1st Diag lesion is 70% stenosed.  Ramus lesion is 20% stenosed.  Mid LAD lesion is 100% stenosed.  Post intervention, there is a 0% residual stenosis.  A stent was successfully placed.  Acute anterolateral ST segment elevation myocardial infarction secondary to total  occlusion of the mid LAD with TIMI 0 flow and absence of collaterals. Concomitant CAD with tortuous 1st diagonal vessel with 70% narrowing beyond a sharp bend in the vessel, 20% narrowing beyond a sharp bend in the ramus immediate vessel, and dominant left circumflex coronary artery with 50% focal OM 3 stenosis, distal 30% stenoses in the AV groove, and focal 70% stenosis in the midportion of a small PLA branch.  Normal small nondominant RCA. LVEDP 12 millimeters Hg. Successful PCI to the totally occluded mid LAD with ultimate insertion of a Resolute Onyx 3.0 x 22 mm DES stent postdilated to 3.25 mm with digital narrowing of 0% and restoration of TIMI-3 flow. RECOMMENDATION: DAPT for minimum of 1 year.  Aggressive lipid-lowering therapy with titration of atorvastatin to 80 mg daily.  We will reinitiate ARB therapy with losartan initially at lower dose, add low-dose beta-blocker therapy, and obtain 2D echo Doppler study in a.m. optimal blood pressure control with target blood pressure less than 130/80 and ideal blood pressure less than 120/80.  We will diabetes control.  If patient has LV dysfunction, SGLT 2 inhibition can be considered.   DG Chest Port 1 View  Result Date: 05/30/2020 CLINICAL DATA:  Chest pain, fever EXAM: PORTABLE CHEST 1 VIEW COMPARISON:  12/31/2019 FINDINGS: The heart size and mediastinal contours are within normal limits. Unchanged elevation of the right hemidiaphragm. No acute airspace opacity. The visualized skeletal structures are unremarkable. IMPRESSION: No acute abnormality of the lungs in portable projection. Unchanged elevation of the right hemidiaphragm. No acute airspace opacity. Electronically Signed   By: Lauralyn Primes M.D.   On: 05/30/2020 11:52   ECHOCARDIOGRAM COMPLETE  Result Date: 05/31/2020    ECHOCARDIOGRAM REPORT   Patient Name:   [name] Merritt Date of Exam: 05/31/2020 Medical Rec #:  [id number]        Height:       74.0 in Accession #:    [id number]       Weight:        310.0 lb Date of Birth:  January 29, 1971        BSA:          2.618 m Patient Age:    49 years         BP:           104/77 mmHg  Patient Gender: M                HR:           78 bpm. Exam Location:  Inpatient Procedure: 2D Echo, Cardiac Doppler, Color Doppler and Intracardiac            Opacification Agent Indications:    CAD Native Vessel 414.01 / I25.10  History:        Patient has no prior history of Echocardiogram examinations.                 Risk Factors:Hypertension, Diabetes, Dyslipidemia and                 Non-Smoker.  Sonographer:    Renella Cunas RDCS Referring Phys: [phone] Adahlia Stembridge M [place] IMPRESSIONS  1. Akinesis of the mid septum into the apex consistent with LAD infarction. Contrast imaging shows no evidence of LV thrombus. Left ventricular ejection fraction, by estimation, is 35 to 40%. The left ventricle has moderately decreased function. The left ventricle demonstrates regional wall motion abnormalities (see scoring diagram/findings for description). Left ventricular diastolic parameters are consistent with Grade II diastolic dysfunction (pseudonormalization). Elevated left atrial pressure.  2. Right ventricular systolic function is normal. The right ventricular size is normal. There is normal pulmonary artery systolic pressure. The estimated right ventricular systolic pressure is 19.3 mmHg.  3. The mitral valve is grossly normal. Trivial mitral valve regurgitation. No evidence of mitral stenosis.  4. The aortic valve is tricuspid. Aortic valve regurgitation is not visualized. Mild aortic valve sclerosis is present, with no evidence of aortic valve stenosis.  5. The inferior vena cava is normal in size with greater than 50% respiratory variability, suggesting right atrial pressure of 3 mmHg. Conclusion(s)/Recommendation(s): Findings consistent with ischemic cardiomyopathy. FINDINGS  Left Ventricle: Akinesis of the mid septum into the apex consistent with LAD infarction. Contrast imaging shows no evidence of  LV thrombus. Left ventricular ejection fraction, by estimation, is 35 to 40%. The left ventricle has moderately decreased function. The left ventricle demonstrates regional wall motion abnormalities. Definity contrast agent was given IV to delineate the left ventricular endocardial borders. The left ventricular internal cavity size was normal in size. There is no left ventricular hypertrophy. Left ventricular diastolic parameters are consistent with Grade II diastolic dysfunction (pseudonormalization). Elevated left atrial pressure.  LV Wall Scoring: The mid and distal anterior septum, apical anterior segment, and apex are akinetic. Right Ventricle: The right ventricular size is normal. No increase in right ventricular wall thickness. Right ventricular systolic function is normal. There is normal pulmonary artery systolic pressure. The tricuspid regurgitant velocity is 2.02 m/s, and  with an assumed right atrial pressure of 3 mmHg, the estimated right ventricular systolic pressure is 19.3 mmHg. Left Atrium: Left atrial size was normal in size. Right Atrium: Right atrial size was normal in size. Pericardium: Trivial pericardial effusion is present. Presence of pericardial fat pad. Mitral Valve: The mitral valve is grossly normal. Trivial mitral valve regurgitation. No evidence of mitral valve stenosis. Tricuspid Valve: The tricuspid valve is grossly normal. Tricuspid valve regurgitation is trivial. No evidence of tricuspid stenosis. Aortic Valve: The aortic valve is tricuspid. . There is mild thickening and mild calcification of the aortic valve. Aortic valve regurgitation is not visualized. Mild aortic valve sclerosis is present, with no evidence of aortic valve stenosis. There is mild thickening of the aortic valve. There is mild calcification of the aortic valve. Pulmonic Valve: The pulmonic valve was grossly  normal. Pulmonic valve regurgitation is not visualized. No evidence of pulmonic stenosis. Aorta: The  aortic root is normal in size and structure. Venous: The inferior vena cava is normal in size with greater than 50% respiratory variability, suggesting right atrial pressure of 3 mmHg. IAS/Shunts: The atrial septum is grossly normal.  LEFT VENTRICLE PLAX 2D LVIDd:         4.00 cm     Diastology LVIDs:         3.10 cm     LV e' lateral:   7.89 cm/s LV PW:         0.80 cm     LV E/e' lateral: 13.2 LV IVS:        0.80 cm     LV e' medial:    5.70 cm/s LVOT diam:     2.30 cm     LV E/e' medial:  18.2 LV SV:         69 LV SV Index:   26 LVOT Area:     4.15 cm  LV Volumes (MOD) LV vol d, MOD A4C: 95.6 ml LV vol s, MOD A4C: 53.9 ml LV SV MOD A4C:     95.6 ml RIGHT VENTRICLE RV S prime:     11.10 cm/s TAPSE (M-mode): 1.7 cm LEFT ATRIUM             Index       RIGHT ATRIUM           Index LA diam:        3.50 cm 1.34 cm/m  RA Area:     10.60 cm LA Vol (A2C):   33.0 ml 12.60 ml/m RA Volume:   23.30 ml  8.90 ml/m LA Vol (A4C):   20.9 ml 7.98 ml/m LA Biplane Vol: 27.7 ml 10.58 ml/m  AORTIC VALVE LVOT Vmax:   99.90 cm/s LVOT Vmean:  70.700 cm/s LVOT VTI:    0.165 m  AORTA Ao Root diam: 3.30 cm MITRAL VALVE                TRICUSPID VALVE MV Area (PHT): 5.27 cm     TR Peak grad:   16.3 mmHg MV Decel Time: 144 msec     TR Vmax:        202.00 cm/s MV E velocity: 104.00 cm/s MV A velocity: 73.70 cm/s   SHUNTS MV E/A ratio:  1.41         Systemic VTI:  0.16 m                             Systemic Diam: 2.30 cm Lennie Odor MD Electronically signed by Lennie Odor MD Signature Date/Time: 05/31/2020/5:21:43 PM    Final     Cardiac Studies   LHC 05/30/20  3rd Mrg lesion is 50% stenosed.  LPDA lesion is 70% stenosed.  LPAV-1 lesion is 30% stenosed.  LPAV-2 lesion is 30% stenosed.  1st Diag lesion is 70% stenosed.  Ramus lesion is 20% stenosed.  Mid LAD lesion is 100% stenosed.  Post intervention, there is a 0% residual stenosis.  A stent was successfully placed.   Acute anterolateral ST segment elevation  myocardial infarction secondary to total occlusion of the mid LAD with TIMI 0 flow and absence of collaterals.  Concomitant CAD with tortuous 1st diagonal vessel with 70% narrowing beyond a sharp bend in the vessel, 20% narrowing beyond a sharp bend in the ramus immediate  vessel, and dominant left circumflex coronary artery with 50% focal OM 3 stenosis, distal 30% stenoses in the AV groove, and focal 70% stenosis in the midportion of a small PLA branch.  Normal small nondominant RCA.  LVEDP 12 millimeters Hg.   Successful PCI to the totally occluded mid LAD with ultimate insertion of a Resolute Onyx 3.0 x 22 mm DES stent postdilated to 3.25 mm with digital narrowing of 0% and restoration of TIMI-3 flow.  RECOMMENDATION: DAPT for minimum of 1 year.  Aggressive lipid-lowering therapy with titration of atorvastatin to 80 mg daily.  We will reinitiate ARB therapy with losartan initially at lower dose, add low-dose beta-blocker therapy, and obtain 2D echo Doppler study in a.m. optimal blood pressure control with target blood pressure less than 130/80 and ideal blood pressure less than 120/80.  We will diabetes control.  If patient has LV dysfunction, SGLT 2 inhibition can be considered.   2D echo 05/31/20 1. Akinesis of the mid septum into the apex consistent with LAD  infarction. Contrast imaging shows no evidence of LV thrombus. Left  ventricular ejection fraction, by estimation, is 35 to 40%. The left  ventricle has moderately decreased function. The  left ventricle demonstrates regional wall motion abnormalities (see  scoring diagram/findings for description). Left ventricular diastolic  parameters are consistent with Grade II diastolic dysfunction  (pseudonormalization). Elevated left atrial pressure.  2. Right ventricular systolic function is normal. The right ventricular  size is normal. There is normal pulmonary artery systolic pressure. The  estimated right ventricular systolic  pressure is 19.3 mmHg.  3. The mitral valve is grossly normal. Trivial mitral valve  regurgitation. No evidence of mitral stenosis.  4. The aortic valve is tricuspid. Aortic valve regurgitation is not  visualized. Mild aortic valve sclerosis is present, with no evidence of  aortic valve stenosis.  5. The inferior vena cava is normal in size with greater than 50%  respiratory variability, suggesting right atrial pressure of 3 mmHg.   Conclusion(s)/Recommendation(s): Findings consistent with ischemic  cardiomyopathy.    Patient Profile     49 y.o. male with history of poorly controlled DM, HTN, HLD and family history of CAD presented to [facility] 05/30/2020 late in the course of an anterior STEMI (pain began Friday, presented on Sunday). Underwent DES to LAD as above with residual disease treated medically.  Assessment & Plan    1. Acute anterior STEMI/CAD- late presentation - found to have occluded LAD which was successfully stented with DES Residual 50% stenosis in OM and 70% stenosis in left PDA. Will manage residual disease medically - Now on DAPT with ASA and Brilinta, will need to take for one year - scant hematuria and blowing clot out of nose - UA on admit was clear without infection. Will check CBC today. If stable plan to monitor for hematuria. If persists may need outpatient urology referral.  - 2D echo c/w ischemic cardiomyopathy, see below  2. Ischemic cardiomyopathy - LVEF 35-40%, now on losartan + carvedilol - plan to switch losartan to Entresto 24/26 mg bid.  - further titration of CHF meds as outpatient as BP allows. - will add Farxiga for both CHF and DM indications.   3. Fever, Tmax 102.3, also chronic dry cough (prior to ARB initiation) - suspected fever and leukocytosis is due to infarct - still with low grade temp yesterday, afebrile this morning - UA with glycosuria and small Hgb/protein but negative leuks/nitrite - blood cultures neg thus far  4. Uncontrolled  DM - A1C 9.8 - Lantus and SSI increased yesterday - plan per diabetic coordinator to increase Lantus to 55 units qhs. Will add [place]. - will need close follow up with Dr Nehemiah Settle.  5. Hypercholesterolemia with goal LDL 182 -. Now on high dose lipitor 80 mg daily, goal LDL < 70 - If the patient is tolerating statin at time of follow-up appointment, would consider rechecking liver function/lipid panel in 6 weeks   6. HTN - managed in context of the above  7. Hyponatremia - felt due to HCTZ and hyperglycemia (corrects to 133.7) - repeat in AM if inpatient  8. Morbid obesity with OSA - per pt, recent dx of sleep apnea by [facility] pulm and is awaiting starting CPAP - needs lifestyle modification   For questions or updates, please contact [facility] Please consult [email or web address] for contact info under Cardiology/STEMI.  Signed, Laurann Montana, PA-C 06/01/2020, 9:31 AM    Patient seen and examined and history reviewed. Agree with above findings and plan. I have amended note above to reflect my findings on exam and recommendations. Plan DC home later today. Discussed with patient the cost of medication including co pay for Beatriz Chancellor, and Farxiga. Feels he can manage with this.   Alannah Averhart [place], MDFACC 06/01/2020 11:28 AM

## 2020-06-01 NOTE — Research (Signed)
DEMOGRAPHICS:  Patient Name: [name] Merritt Birth Date:  1970-11-12  Sex: Male  Race: black  Child Bearing: ? Yes    ? No ? Tubial ligation ? Hysterectomy  ? postmenopausal   Height:  188cm Weight:  138kg   Index Procedure:  Onset date of symptoms: 7-Aug-21 Onset of symptoms: 2100  Date of First contact at hospital: 8-Aug-21 Time of first contact at hospital: 0943  Admission Date: 8-Aug-21   Discharge Date: 10-Aug-21 Discharge Time: 1631   Vital Signs: Date 05/30/2020    Time: 1000 BP: 163/100  Pulse: 117    Concomitant medications: Every visit: ? See med sheet  BMP Pre Contrast IV 05/30/2020  0952   1.30   BMP Post Contrast IV 12 hours later: 06/01/2020 @ 1109    1.28 Hepatic Panel: 05/30/2020 @ 0952 ALT: 31 Total Bili: 1.1 Direct Bili: ND   Medical History:  ? CAD ? Prior MI ? PAD  ? History of Heart Failure ? Moderate to severe valvular dx ? AFib  ? Prior Coronary Revascularization  if YES please select Yes or No below:  CABG ? Yes   ? No           PCI with stent ? Yes   ? No          PCI without stent ? Yes ? No  ? CVA if checked please select one of the following Choose an item.  ? Hypertension  ? Gilberts syndrome ? CKD   ? Hypocholesteremia ? DM ? Smoker     Former                   ? eCigarette  [place] Class Stage 1     [id number] ?  Future Biomedical Research: Consented ? Yes     ? No If no please date they withdrew consent from biomedical research Click or tap to enter a date.  Central Labs Before Start of Infusion: ? Biochemistry panel      ? Hematology     ? Immunogenicity   (30 mins before infusion)   ? Parvovirus  ? FBR sample ? PK/PD sample Central Labs End of Infusion:   ? PK/PD Central Blood Draw Time: Before SOI:  _08/07/2020 @ 1410_ After EOI: _08/07/2020 @ 1605_ Infusion Start Time: 06/01/2020 2:13 PM  Infusion End Time: 06/01/2020 4:00 PM VS 06/01/2020 @ 1154  HR 80  BP 124/78

## 2020-06-01 NOTE — Research (Signed)
Patient seen by research team and also by me.  Presented with STEMI and underwent PCI to the LAD by Dr. Tresa Endo.  Patient has been stable.  Patient has chronic cough, but otherwise stable.  Now pain free. EF 40% with anterior akinesis on echo. No evidence of mural thrombus by contrast echo.    VS as recorded.  Lungs minimal ronchii.   No JVD.  Cardiac exam regular but hs distant due to size.  No edema.    Benefits and risks to patient discussed with patient.  He wishes  to proceed with randomization.   NYHA Class I  [name] Merritt. Riley Kill, MD, [facility] Medical Director, [facility]

## 2020-06-01 NOTE — Discharge Instructions (Signed)
PLEASE REMEMBER TO BRING ALL OF YOUR MEDICATIONS TO EACH OF YOUR FOLLOW-UP OFFICE VISITS.  PLEASE ATTEND ALL SCHEDULED FOLLOW-UP APPOINTMENTS.   Activity: Increase activity slowly as tolerated. You may shower, but no soaking baths (or swimming) for 1 week. No driving for 24 hours. No lifting over 5 lbs for 1 week. No sexual activity for 1 week.   You May Return to Work: when cleared to do so after your outpatient follow-up visit.   Wound Care: You may wash cath site gently with soap and water. Keep cath site clean and dry. If you notice pain, swelling, bleeding or pus at your cath site, please call [phone].  Please pick up a blood pressure cuff from your pharmacy. Check your blood pressure morning and night and keep a log of your blood pressures over the next few weeks. Bring the log with you to your follow-up appointment. This will help determine if any additional medication changes need to occur.   Heart Failure Education: 1. Weigh yourself EVERY morning after you go to the bathroom but before you eat or drink anything. Write this number down in a weight log/diary. If you gain 3 pounds overnight or 5 pounds in a week, call the office. 2. Take your medicines as prescribed. If you have concerns about your medications, please call us before you stop taking them.  3. Eat low salt foods--Limit salt (sodium) to 2000 mg per day. This will help prevent your body from holding onto fluid. Read food labels as many processed foods have a lot of sodium, especially canned goods and prepackaged meats. If you would like some assistance choosing low sodium foods, we would be happy to set you up with a nutritionist. 4. Stay as active as you can everyday. Staying active will give you more energy and make your muscles stronger. Start with 5 minutes at a time and work your way up to 30 minutes a day. Break up your activities--do some in the morning and some in the afternoon. Start with 3 days per week and work  your way up to 5 days as you can.  If you have chest pain, feel short of breath, dizzy, or lightheaded, STOP. If you don't feel better after a short rest, call 911. If you do feel better, call the office to let us know you have symptoms with exercise. 5. Limit all fluids for the day to less than 2 liters. Fluid includes all drinks, coffee, juice, ice chips, soup, jello, and all other liquids.      Information about your medication: Brilinta (anti-platelet agent)  Generic Name (Brand): ticagrelor (Brilinta), twice daily medication  PURPOSE: You are taking this medication along with aspirin to lower your chance of having a heart attack, stroke, or blood clots in your heart stent. These can be fatal. Brilinta and aspirin help prevent platelets from sticking together and forming a clot that can block an artery or your stent.   Common SIDE EFFECTS you may experience include: bruising or bleeding more easily, shortness of breath  Do not stop taking BRILINTA without talking to the doctor who prescribes it for you. People who are treated with a stent and stop taking Brilinta too soon, have a higher risk of getting a blood clot in the stent, having a heart attack, or dying. If you stop Brilinta because of bleeding, or for other reasons, your risk of a heart attack or stroke may increase.   Avoid taking NSAID agents or anti-inflammatory medications such as ibuprofen,  naproxen given increased bleed risk with plavix - can use acetaminophen (Tylenol) if needed for pain.  Tell all of your doctors and dentists that you are taking Brilinta. They should talk to the doctor who prescribed Brilinta for you before you have any surgery or invasive procedure.   Contact your health care provider if you experience: severe or uncontrollable bleeding, pink/red/brown urine, vomiting blood or vomit that looks like "coffee grounds", red or black stools (looks like tar), coughing up blood or blood  clots ---------------------------------------------------------------------------------------------------------------------- Information about your medication:  Beta Blocker (Heart rate/Blood Pressure-lowering agent)  Generic Name (Brand): metoprolol tartrate (Lopressor), metoprolol succinate (Toprol XL), carvedilol (Coreg), Bisoprolol (Zebeta)  PURPOSE: You are taking this medication to lower blood pressure and heart rate to help prevent further damage to your heart and to reduce your risk of death following your cardiac event.   Common SIDE EFFECTS you may experience include: fatigue, dizziness, diarrhea, or nausea. This medication may also mask the signs of hypoglycemia  Take your medication exactly as prescribed.   Contact your health care provider if you experience: severely lower bllod pressure, syncope, palpitations, or chest pain muscle ----------------------------------------------------------------------------------------------------------------------  Information about your medication: Statin (cholesterol-lowering agent)  Generic Name (Brand): atorvastatin (Lipitor), pravastatin (Pravachol), rosuvastatin (Crestor), simvastatin (Zocor)  PURPOSE: You are taking this medication to lower your "bad" cholesterol (LDL) and to prevent heart attacks and strokes. Statins can also raise your "good" cholesterol (HDL).  Common SIDE EFFECTS you may experience include: muscle pain or weakness (especially in the legs) and upset stomach.  Take your medication exactly as prescribed. Do not eat large amounts of grapefruit or grapefruit juice while taking this medication.  Contact your health care provider if you experience: severe muscle pain that does not improve, dark urine, or yellowing of your skin or eyes. ----------------------------------------------------------------------------------------------------------------------   Fingerstick glucose (sugar) goals for home: Before meals: 80-130  mg/dl 2-Hours after meals: less than 180 mg/dl Hemoglobin I7P goal: 7% or less

## 2020-06-01 NOTE — Research (Signed)
AEGIS Informed Consent   Subject Name: [name] Merritt  Subject met inclusion and exclusion criteria.  The informed consent form, study requirements and expectations were reviewed with the subject and questions and concerns were addressed prior to the signing of the consent form.  The subject verbalized understanding of the trail requirements.  The subject agreed to participate in the AEGIS trial and signed the informed consent.  The informed consent was obtained prior to performance of any protocol-specific procedures for the subject.  A copy of the signed informed consent was given to the subject and a copy was placed in the subject's medical record.  [name] D 06/01/2020, 1245pm

## 2020-06-01 NOTE — Research (Signed)
AEGIS V1 AND V2  Patient doing well, no complaints of chest pains and shortness of breath. Infusion went well.                                    "CONSENT"   YES     NO   Continuing further Investigational Product and study visits for follow-up? [x]  []   Continuing consent from future biomedical research [x]  []                                   "EVENTS"    YES     NO  AE   (IF YES SEE SOURCE) []  [x]   SAE  (IF YES SEE SOURCE) []  [x]   ENDPOINT   (IF YES SEE SOURCE) []  [x]   REVASCULARIZATION  (IF YES SEE SOURCE) []  [x]   AMPUTATION   (IF YES SEE SOURCE) []  [x]   TROPONIN'S  (IF YES SEE SOURCE) []  [x]    [facility] drawn today:  [id number]  MOBILITY:    I HAVE NO PROBLEMS WALKING [x]   I HAVE SLIGHT PROBLEMS WALKING []   I HAVE MODERATE PROBLEMS WALKING []   I HAVE SEVERE PROBLEMS WALKING []   I AM UNABLE TO WALK  []     SELF-CARE:   I HAVE NO PROBLEMS WASING OR DRESSING MYSELF  [x]   I HAVE SLIGHT PROBLEMS WASHING OR DRESSING MYSELF  []   I HAVE MODERATE PROBLEMS WASHING OR DRESSING MYSELF []   I HAVE SEVERE PROBLEMS WASHING OR DRESSING MYSELF  []   I HAVE SEVERE PROBLEMS WASHING OR DRESSING MYSELF  []   I AM UNABLE TO WASH OR DRESS MYSELF []     USUAL ACTIVITIES: (E.G. WORK/STUDY/HOUSEWORK/FAMILY OR LEISURE ACTIVITIES.    I HAVE NO PROBLEMS DOING MY USUAL ACTIVITIES [x]   I HAVE SLIGHT PROBLEMS DOING MY USUAL ACTIVITIES []   I HAVE MODERATE PROBLEMS DOING MY USUAL ACTIVIITIES []   I HAVE SEVERE PROBLEMS DOING MY USUAL ACTIVITIES []   I AM UNABLE TO DO MY USUAL ACTIVITIES []     PAIN /DISCOMFORT   I HAVE NO PAIN OR DISCOMFORT [x]   I HAVE SLIGHT PAIN OR DISCOMFORT []   I HAVE MODERATE PAIN OR DISCOMFORT []   I HAVE SEVERE PAIN OR DISCOMFORT []   I HAVE EXTREME PAIN OR DISCOMFORT []     ANXIETY/DEPRESSION   I AM NOT ANXIOUS OR DEPRESSED [x]   I AM SLIGHTLY ANXIOUS OR DEPRESSED []   I AM MODERATELY ANXIOUS OR DREPRESSED []   I AM SEVERELY ANXIOUS OR DEPRESSED []   I AM EXTREMELY ANXIOUS OR  DEPRESSED []     SCALE OF 0-100 HOW WOULD YOU RATE TODAY?  0 IS THE WORSE AND 100 IS THE BEST HEALTH YOU CAN IMAGINE: 75    Current Facility-Administered Medications:    0.9 %  sodium chloride infusion, , Intravenous, Continuous, , Peter M, MD, Last Rate: 150 mL/hr at 05/30/20 1148, Rate Change at 05/30/20 1148   0.9 %  sodium chloride infusion, 250 mL, Intravenous, PRN, , Peter M, MD   acetaminophen (TYLENOL) tablet 650 mg, 650 mg, Oral, Q4H PRN, , Peter M, MD, 650 mg at 05/31/20 1634   aspirin chewable tablet 81 mg, 81 mg, Oral, Daily, , Peter M, MD, 81 mg at 06/01/20 0850   atorvastatin (LIPITOR) tablet 80 mg, 80 mg, Oral, q1800, , Peter M, MD, 80 mg at 05/31/20 1630   benzonatate (TESSALON) capsule 100  mg, 100 mg, Oral, TID PRN, [place], Peter M, MD   carvedilol (COREG) tablet 6.25 mg, 6.25 mg, Oral, BID WC, [place], Peter M, MD, 6.25 mg at 06/01/20 0850   dapagliflozin propanediol (FARXIGA) tablet 5 mg, 5 mg, Oral, Daily, Kroeger, Krista M., PA-C, 5 mg at 06/01/20 1156   diazepam (VALIUM) tablet 5 mg, 5 mg, Oral, Q6H PRN, [place], Peter M, MD, 5 mg at 05/30/20 2111   enoxaparin (LOVENOX) injection 40 mg, 40 mg, Subcutaneous, Q24H, [place], Peter M, MD, 40 mg at 06/01/20 2549   guaiFENesin tablet 400 mg, 400 mg, Oral, Q4H PRN, [place], Peter M, MD   insulin aspart (novoLOG) injection 0-5 Units, 0-5 Units, Subcutaneous, QHS, [place], Peter M, MD, 3 Units at 05/31/20 2152   insulin aspart (novoLOG) injection 0-9 Units, 0-9 Units, Subcutaneous, TID WC, Lennette Bihari, MD, 3 Units at 06/01/20 0636   insulin aspart (novoLOG) injection 6 Units, 6 Units, Subcutaneous, TID WC, Lennette Bihari, MD, 6 Units at 06/01/20 0853   insulin glargine (LANTUS) injection 55 Units, 55 Units, Subcutaneous, QHS, Dunn, Dayna N, PA-C   Living Better with Heart Failure Book, , Does not apply, Once, Dunn, Dayna N, PA-C   loratadine (CLARITIN) tablet 10 mg, 10 mg, Oral, Daily,  [place], Peter M, MD, 10 mg at 06/01/20 0850   nitroGLYCERIN (NITROSTAT) SL tablet 0.4 mg, 0.4 mg, Sublingual, Q5 min PRN, [place], Peter M, MD, 0.4 mg at 05/30/20 1346   ondansetron [facility]) injection 4 mg, 4 mg, Intravenous, Q6H PRN, [place], Peter M, MD, 4 mg at 05/31/20 1022   sacubitril-valsartan (ENTRESTO) 24-26 mg per tablet, 1 tablet, Oral, BID, Kroeger, Krista M., PA-C, 1 tablet at 06/01/20 1156   sodium chloride flush (NS) 0.9 % injection 3 mL, 3 mL, Intravenous, Q12H, [place], Peter M, MD, 3 mL at 06/01/20 0854   sodium chloride flush (NS) 0.9 % injection 3 mL, 3 mL, Intravenous, PRN, [place], Peter M, MD   ticagrelor [facility]) tablet 90 mg, 90 mg, Oral, BID, [place], Peter M, MD, 90 mg at 06/01/20 8264  Current Outpatient Medications:    acetaminophen (TYLENOL) 650 MG CR tablet, Take 1,300 mg by mouth every 8 (eight) hours as needed for pain., Disp: , Rfl:    albuterol (VENTOLIN HFA) 108 (90 Base) MCG/ACT inhaler, Inhale 2 puffs into the lungs every 6 (six) hours as needed for wheezing or shortness of breath., Disp: , Rfl:    cetirizine (ZYRTEC) 10 MG tablet, Take 1 tablet (10 mg total) by mouth daily., Disp: 30 tablet, Rfl: 5   fluticasone (FLONASE) 50 MCG/ACT nasal spray, SHAKE LIQUID AND USE 1 SPRAY IN EACH NOSTRIL DAILY (Patient taking differently: Place 2 sprays into both nostrils daily as needed for allergies or rhinitis. ), Disp: 16 g, Rfl: 2   magnesium citrate SOLN, Take 1 Bottle by mouth once as needed for mild constipation or moderate constipation. , Disp: , Rfl:    pantoprazole (PROTONIX) 40 MG tablet, Take 40 mg by mouth 2 (two) times daily., Disp: , Rfl:    [START ON 06/02/2020] aspirin 81 MG chewable tablet, Chew 1 tablet (81 mg total) by mouth daily., Disp: 90 tablet, Rfl: 3   atorvastatin (LIPITOR) 80 MG tablet, Take 1 tablet (80 mg total) by mouth daily at 6 PM., Disp: 90 tablet, Rfl: 3   carvedilol (COREG) 6.25 MG tablet, Take 1 tablet (6.25 mg total) by mouth  2 (two) times daily with a meal., Disp: 180 tablet, Rfl: 3  dapagliflozin propanediol (FARXIGA) 5 MG TABS tablet, Take 1 tablet (5 mg total) by mouth daily., Disp: 30 tablet, Rfl: 3   LANTUS SOLOSTAR 100 UNIT/ML Solostar Pen, Inject 55 Units into the skin daily after breakfast., Disp: 15 mL, Rfl: 11   nitroGLYCERIN (NITROSTAT) 0.4 MG SL tablet, Place 1 tablet (0.4 mg total) under the tongue every 5 (five) minutes as needed for chest pain., Disp: 25 tablet, Rfl: 3   sacubitril-valsartan (ENTRESTO) 24-26 MG, Take 1 tablet by mouth 2 (two) times daily., Disp: 60 tablet, Rfl: 3   ticagrelor (BRILINTA) 90 MG TABS tablet, Take 1 tablet (90 mg total) by mouth 2 (two) times daily., Disp: 180 tablet, Rfl: 3

## 2020-06-01 NOTE — Progress Notes (Signed)
CARDIAC REHAB PHASE I   PRE:  Rate/Rhythm: 84 SR  BP:  Supine:   Sitting: 125/81  Standing:    SaO2: 97%RA  MODE:  Ambulation: 540 ft   POST:  Rate/Rhythm: 89 SR  BP:  Supine:   Sitting: 133/88  Standing:    SaO2:  1034-1107 Pt walked 540 ft on RA with steady gait and tolerated well. No CP. Since EF low gave pt low sodium diets and discussed 2000 mg sodium restriction, daily weights and when to call MD with weight gain or signs of fluid retention. Understanding voiced by pt and wife.   Luetta Nutting, RN BSN  06/01/2020 11:10 AM   2

## 2020-06-01 NOTE — Discharge Summary (Signed)
Discharge Summary    Patient ID: [name] Merritt Merritt,  MRN: [id number], DOB/AGE: 1970-12-08 49 y.o.  Admit date: 05/30/2020 Discharge date: 06/01/2020  Primary Care Provider: Renford Merritt Primary Cardiologist: [name] Merritt [place], MD  Discharge Diagnoses    Principal Problem:   ST elevation myocardial infarction involving left anterior descending (LAD) coronary artery [facility]) Active Problems:   Ischemic cardiomyopathy   Hypertension   Hyperlipidemia with target LDL less than 70   DM type 2, goal HbA1c < 7% (HCC)   OSA (obstructive sleep apnea)   Morbid obesity (HCC)   Allergies Allergies  Allergen Reactions  . Pineapple Itching and Other (See Comments)    All-over itching (mouth, too)  . Tomato Itching and Other (See Comments)    All-over itching (mouth, too) Ketchup, also  . Rosuvastatin Calcium Hives and Rash    Diagnostic Studies/Procedures    LHC 05/30/20:  3rd Mrg lesion is 50% stenosed.  LPDA lesion is 70% stenosed.  LPAV-1 lesion is 30% stenosed.  LPAV-2 lesion is 30% stenosed.  1st Diag lesion is 70% stenosed.  Ramus lesion is 20% stenosed.  Mid LAD lesion is 100% stenosed.  Post intervention, there is a 0% residual stenosis.  A stent was successfully placed.   Acute anterolateral ST segment elevation myocardial infarction secondary to total occlusion of the mid LAD with TIMI 0 flow and absence of collaterals.  Concomitant CAD with tortuous 1st diagonal vessel with 70% narrowing beyond a sharp bend in the vessel, 20% narrowing beyond a sharp bend in the ramus immediate vessel, and dominant left circumflex coronary artery with 50% focal OM 3 stenosis, distal 30% stenoses in the AV groove, and focal 70% stenosis in the midportion of a small PLA branch.  Normal small nondominant RCA.  LVEDP 12 millimeters Hg.   Successful PCI to the totally occluded mid LAD with ultimate insertion of a Resolute Onyx 3.0 x 22 mm DES stent postdilated to 3.25 mm with digital  narrowing of 0% and restoration of TIMI-3 flow.  RECOMMENDATION: DAPT for minimum of 1 year.  Aggressive lipid-lowering therapy with titration of atorvastatin to 80 mg daily.  We [name] Merritt reinitiate ARB therapy with losartan initially at lower dose, add low-dose beta-blocker therapy, and obtain 2D echo Doppler study in a.m. optimal blood pressure control with target blood pressure less than 130/80 and ideal blood pressure less than 120/80.  We [name] Merritt diabetes control.  If patient has LV dysfunction, SGLT 2 inhibition can be considered.  Echo 05/31/20: 1. Akinesis of the mid septum into the apex consistent with LAD  infarction. Contrast imaging shows no evidence of LV thrombus. Left  ventricular ejection fraction, by estimation, is 35 to 40%. The left  ventricle has moderately decreased function. The  left ventricle demonstrates regional wall motion abnormalities (see  scoring diagram/findings for description). Left ventricular diastolic  parameters are consistent with Grade II diastolic dysfunction  (pseudonormalization). Elevated left atrial pressure.  2. Right ventricular systolic function is normal. The right ventricular  size is normal. There is normal pulmonary artery systolic pressure. The  estimated right ventricular systolic pressure is 19.3 mmHg.  3. The mitral valve is grossly normal. Trivial mitral valve  regurgitation. No evidence of mitral stenosis.  4. The aortic valve is tricuspid. Aortic valve regurgitation is not  visualized. Mild aortic valve sclerosis is present, with no evidence of  aortic valve stenosis.  5. The inferior vena cava is normal in size with greater than 50%  respiratory variability, suggesting right  atrial pressure of 3 mmHg.  _____________   History of Present Illness     [name] Merritt Merritt has no prior cardiac history. He presented to [facility] with complaints of chest pain. He reports intermittent chest pain all day yesterday that subsided at bedtime. He woke up  with 10/10 crushing substernal chest pain at 0500 today prompting evaluation. CP radiates to his left arm and is associated with fatigue. He arrived by private vehicle. On arrival, EKG showed ST elevation in anterior and lateral leads with reciprocal changes. CODE STEMI was activated and he was taken emergently to the cath lab.   Family history include DM and HTN in both parents. Mother recently admitted for ACS. He has never smoked.    Hospital Course     Consultants: DM coordinator   1. Acute anterior STEMI/CAD: patient presented with chest pain. Found to have anterolateral STE on EKG. HsTrop elevated to 7578. He was taken emergently to the cath lab where he was found to have occluded LAD which was successfully stented with DES Residual 50% stenosis in OM and 70% stenosis in left PDA which was medically managed. He was started on aspirin and brilinta for DAPT, with recommendations to continue uninterrupted for 1 year.  - Continue aspirin, brilinta, and statin  2. Ischemic cardiomyopathy: Echo this admission showed EF 35-40% with G2DD, +RWMA, no LV thrombus, and no significant valvular abnormalities. Started on losartan and transitioned to entresto prior to discharge. Started on carvedilol 6.25mg  BID. Low sodium diet encouraged.  - Continue carvedilol and entresto - Encouraged home BP monitoring to assist with medication titration at his follow-up visit - [name] Merritt need repeat BMET at follow-up. - Consider repeat echo after 3 months of GDMT to evaluate for improvement in EF   3. Fever, Tmax 102.3, also chronic dry cough (prior to ARB initiation): suspected fever and leukocytosis is due to infarct. COVID-19 negative. UA with glycosuria and small Hgb/protein but negative leuks/nitrite. Blood cultures neg thus far. Afebrile on the day of discharge - Continue outpatient supportive care with OTC tylenol and cough suppressants   4. Uncontrolled DM: A1C 9.8. DM coordinated followed throughout admission.  Home Lantus increased to 55 units BID. Additionally started on farxiga given new diagnosis of CAD/CHF. - Continue lantus and Farxiga. - Encourage close outpatient follow-up with PCP for ongoing diabetes management.  5. Hypercholesterolemia: LDL 182 on lipids this admission. Started on atorvastatin 80mg  daily with goal LDL <70.  - Continue atorvastatin - If the patient is tolerating statin at time of follow-up appointment, [name] Merritt need repeat liver function/lipid panel in 6 weeks   6. HTN: BP stable this admission.  - Managed in context of the above  7. Hyponatremia: felt due to HCTZ and hyperglycemia (corrects to 133.7). Stable at 134 on the day of discharge - [name] Merritt hold HCTZ at discharge  8. Morbid obesity with OSA: per pt, recent dx of sleep apnea by [place] pulm and is awaiting starting CPAP - Continue to encourage healthy dietary/lifestyle modifications to promote weight loss  9. Hematuria/epistaxis: Scant hematuria on UA and blowing clot out of nose. Hgb 13.6 on the day of discharge. Encouraged avoiding digit trauma to nose and keeping nares moist with vaseline.   - Continue close outpatient monitoring of hematuria   _____________  Discharge Vitals Blood pressure 124/78, pulse 80, temperature (!) 100.4 F (38 C), temperature source Oral, resp. rate 20, height 6\' 2"  (1.88 m), weight (!) 138.8 kg, SpO2 96 %.  Filed Weights   05/30/20 1005  06/01/20 0519  Weight: (!) 140.6 kg (!) 138.8 kg    Labs & Radiologic Studies    CBC Recent Labs    05/30/20 0952 05/30/20 0952 05/30/20 1102 05/31/20 0106  WBC 13.6*  --   --  16.0*  NEUTROABS 9.8*  --   --   --   HGB 16.2   < > 15.6 13.7  HCT 49.7   < > 46.0 42.0  MCV 92.9  --   --  91.7  PLT 331  --   --  280   < > = values in this interval not displayed.   Basic Metabolic Panel Recent Labs    52/84/13 0106 06/01/20 1109  NA 130* 134*  K 4.1 4.1  CL 95* 96*  CO2 24 26  GLUCOSE 332* 219*  BUN 18 21*  CREATININE 1.23  1.28*  CALCIUM 8.7* 8.8*   Liver Function Tests Recent Labs    05/30/20 0952  AST 74*  ALT 31  ALKPHOS 62  BILITOT 1.1  PROT 8.0  ALBUMIN 3.7   No results for input(s): LIPASE, AMYLASE in the last 72 hours. Cardiac Enzymes No results for input(s): CKTOTAL, CKMB, CKMBINDEX, TROPONINI in the last 72 hours. BNP Invalid input(s): POCBNP D-Dimer No results for input(s): DDIMER in the last 72 hours. Hemoglobin A1C Recent Labs    05/30/20 0952  HGBA1C 9.8*   Fasting Lipid Panel Recent Labs    05/30/20 0952  CHOL 245*  HDL 48  LDLCALC 182*  TRIG 75  CHOLHDL 5.1   Thyroid Function Tests No results for input(s): TSH, T4TOTAL, T3FREE, THYROIDAB in the last 72 hours.  Invalid input(s): FREET3 _____________  CARDIAC CATHETERIZATION  Result Date: 05/30/2020  3rd Mrg lesion is 50% stenosed.  LPDA lesion is 70% stenosed.  LPAV-1 lesion is 30% stenosed.  LPAV-2 lesion is 30% stenosed.  1st Diag lesion is 70% stenosed.  Ramus lesion is 20% stenosed.  Mid LAD lesion is 100% stenosed.  Post intervention, there is a 0% residual stenosis.  A stent was successfully placed.  Acute anterolateral ST segment elevation myocardial infarction secondary to total occlusion of the mid LAD with TIMI 0 flow and absence of collaterals. Concomitant CAD with tortuous 1st diagonal vessel with 70% narrowing beyond a sharp bend in the vessel, 20% narrowing beyond a sharp bend in the ramus immediate vessel, and dominant left circumflex coronary artery with 50% focal OM 3 stenosis, distal 30% stenoses in the AV groove, and focal 70% stenosis in the midportion of a small PLA branch.  Normal small nondominant RCA. LVEDP 12 millimeters Hg. Successful PCI to the totally occluded mid LAD with ultimate insertion of a Resolute Onyx 3.0 x 22 mm DES stent postdilated to 3.25 mm with digital narrowing of 0% and restoration of TIMI-3 flow. RECOMMENDATION: DAPT for minimum of 1 year.  Aggressive lipid-lowering therapy  with titration of atorvastatin to 80 mg daily.  We [name] Merritt reinitiate ARB therapy with losartan initially at lower dose, add low-dose beta-blocker therapy, and obtain 2D echo Doppler study in a.m. optimal blood pressure control with target blood pressure less than 130/80 and ideal blood pressure less than 120/80.  We [name] Merritt diabetes control.  If patient has LV dysfunction, SGLT 2 inhibition can be considered.   DG Chest Port 1 View  Result Date: 05/30/2020 CLINICAL DATA:  Chest pain, fever EXAM: PORTABLE CHEST 1 VIEW COMPARISON:  12/31/2019 FINDINGS: The heart size and mediastinal contours are within normal limits. Unchanged elevation of the  right hemidiaphragm. No acute airspace opacity. The visualized skeletal structures are unremarkable. IMPRESSION: No acute abnormality of the lungs in portable projection. Unchanged elevation of the right hemidiaphragm. No acute airspace opacity. Electronically Signed   By: Lauralyn Primes M.D.   On: 05/30/2020 11:52   ECHOCARDIOGRAM COMPLETE  Result Date: 05/31/2020    ECHOCARDIOGRAM REPORT   Patient Name:   [name] Merritt Merritt Date of Exam: 05/31/2020 Medical Rec #:  [id number]        Height:       74.0 in Accession #:    [id number]       Weight:       310.0 lb Date of Birth:  08/08/1971        BSA:          2.618 m Patient Age:    49 years         BP:           104/77 mmHg Patient Gender: M                HR:           78 bpm. Exam Location:  Inpatient Procedure: 2D Echo, Cardiac Doppler, Color Doppler and Intracardiac            Opacification Agent Indications:    CAD Native Vessel 414.01 / I25.10  History:        Patient has no prior history of Echocardiogram examinations.                 Risk Factors:Hypertension, Diabetes, Dyslipidemia and                 Non-Smoker.  Sonographer:    Renella Cunas RDCS Referring Phys: [phone] PETER M [place] IMPRESSIONS  1. Akinesis of the mid septum into the apex consistent with LAD infarction. Contrast imaging shows no evidence of LV thrombus. Left  ventricular ejection fraction, by estimation, is 35 to 40%. The left ventricle has moderately decreased function. The left ventricle demonstrates regional wall motion abnormalities (see scoring diagram/findings for description). Left ventricular diastolic parameters are consistent with Grade II diastolic dysfunction (pseudonormalization). Elevated left atrial pressure.  2. Right ventricular systolic function is normal. The right ventricular size is normal. There is normal pulmonary artery systolic pressure. The estimated right ventricular systolic pressure is 19.3 mmHg.  3. The mitral valve is grossly normal. Trivial mitral valve regurgitation. No evidence of mitral stenosis.  4. The aortic valve is tricuspid. Aortic valve regurgitation is not visualized. Mild aortic valve sclerosis is present, with no evidence of aortic valve stenosis.  5. The inferior vena cava is normal in size with greater than 50% respiratory variability, suggesting right atrial pressure of 3 mmHg. Conclusion(s)/Recommendation(s): Findings consistent with ischemic cardiomyopathy. FINDINGS  Left Ventricle: Akinesis of the mid septum into the apex consistent with LAD infarction. Contrast imaging shows no evidence of LV thrombus. Left ventricular ejection fraction, by estimation, is 35 to 40%. The left ventricle has moderately decreased function. The left ventricle demonstrates regional wall motion abnormalities. Definity contrast agent was given IV to delineate the left ventricular endocardial borders. The left ventricular internal cavity size was normal in size. There is no left ventricular hypertrophy. Left ventricular diastolic parameters are consistent with Grade II diastolic dysfunction (pseudonormalization). Elevated left atrial pressure.  LV Wall Scoring: The mid and distal anterior septum, apical anterior segment, and apex are akinetic. Right Ventricle: The right ventricular size is normal. No increase in right ventricular wall thickness.  Right ventricular systolic function is normal. There is normal pulmonary artery systolic pressure. The tricuspid regurgitant velocity is 2.02 m/s, and  with an assumed right atrial pressure of 3 mmHg, the estimated right ventricular systolic pressure is 19.3 mmHg. Left Atrium: Left atrial size was normal in size. Right Atrium: Right atrial size was normal in size. Pericardium: Trivial pericardial effusion is present. Presence of pericardial fat pad. Mitral Valve: The mitral valve is grossly normal. Trivial mitral valve regurgitation. No evidence of mitral valve stenosis. Tricuspid Valve: The tricuspid valve is grossly normal. Tricuspid valve regurgitation is trivial. No evidence of tricuspid stenosis. Aortic Valve: The aortic valve is tricuspid. . There is mild thickening and mild calcification of the aortic valve. Aortic valve regurgitation is not visualized. Mild aortic valve sclerosis is present, with no evidence of aortic valve stenosis. There is mild thickening of the aortic valve. There is mild calcification of the aortic valve. Pulmonic Valve: The pulmonic valve was grossly normal. Pulmonic valve regurgitation is not visualized. No evidence of pulmonic stenosis. Aorta: The aortic root is normal in size and structure. Venous: The inferior vena cava is normal in size with greater than 50% respiratory variability, suggesting right atrial pressure of 3 mmHg. IAS/Shunts: The atrial septum is grossly normal.  LEFT VENTRICLE PLAX 2D LVIDd:         4.00 cm     Diastology LVIDs:         3.10 cm     LV e' lateral:   7.89 cm/s LV PW:         0.80 cm     LV E/e' lateral: 13.2 LV IVS:        0.80 cm     LV e' medial:    5.70 cm/s LVOT diam:     2.30 cm     LV E/e' medial:  18.2 LV SV:         69 LV SV Index:   26 LVOT Area:     4.15 cm  LV Volumes (MOD) LV vol d, MOD A4C: 95.6 ml LV vol s, MOD A4C: 53.9 ml LV SV MOD A4C:     95.6 ml RIGHT VENTRICLE RV S prime:     11.10 cm/s TAPSE (M-mode): 1.7 cm LEFT ATRIUM              Index       RIGHT ATRIUM           Index [name] Merritt diam:        3.50 cm 1.34 cm/m  RA Area:     10.60 cm [name] Merritt Vol (A2C):   33.0 ml 12.60 ml/m RA Volume:   23.30 ml  8.90 ml/m [name] Merritt Vol (A4C):   20.9 ml 7.98 ml/m [name] Merritt Biplane Vol: 27.7 ml 10.58 ml/m  AORTIC VALVE LVOT Vmax:   99.90 cm/s LVOT Vmean:  70.700 cm/s LVOT VTI:    0.165 m  AORTA Ao Root diam: 3.30 cm MITRAL VALVE                TRICUSPID VALVE MV Area (PHT): 5.27 cm     TR Peak grad:   16.3 mmHg MV Decel Time: 144 msec     TR Vmax:        202.00 cm/s MV E velocity: 104.00 cm/s MV A velocity: 73.70 cm/s   SHUNTS MV E/A ratio:  1.41         Systemic VTI:  0.16 m  Systemic Diam: 2.30 cm Lennie Odor MD Electronically signed by Lennie Odor MD Signature Date/Time: 05/31/2020/5:21:43 PM    Final    Disposition   Patient was seen and examined by Dr. [place] who deemed patient as stable for discharge. Follow-up has been arranged. Discharge medications as listed below.   Follow-up Plans & Appointments     Follow-up Information    Corrin Parker, PA-C Follow up on 06/21/2020.   Specialties: Physician Assistant, Cardiology Why: Please arrive 15 minutes early for your 11:15am post-hospital cardiology follow-up appointment Contact information: [address] [place] [address] [phone]        [name] Merritt Dills, MD Follow up.   Specialty: Internal Medicine Why: Please call to arrange close outpatient follow-up to be seen within 1-2 weeks given several changes to your diabetes medications.  Contact information: [address] [place] [address] [phone]              Discharge Instructions    Amb Referral to Cardiac Rehabilitation   Complete by: As directed    Diagnosis:  STEMI Coronary Stents     After initial evaluation and assessments completed: Virtual Based Care may be provided alone or in conjunction with Phase 2 Cardiac Rehab based on patient barriers.: Yes       Discharge Medications   Allergies as of 06/01/2020      Reactions   Pineapple Itching, Other (See Comments)   All-over itching (mouth, too)   Tomato Itching, Other (See Comments)   All-over itching (mouth, too) Ketchup, also   Rosuvastatin Calcium Hives, Rash      Medication List    STOP taking these medications   hydrochlorothiazide 25 MG tablet Commonly known as: HYDRODIURIL     TAKE these medications   acetaminophen 650 MG CR tablet Commonly known as: TYLENOL Take 1,300 mg by mouth every 8 (eight) hours as needed for pain.   aspirin 81 MG chewable tablet Chew 1 tablet (81 mg total) by mouth daily. Start taking on: June 02, 2020   atorvastatin 80 MG tablet Commonly known as: LIPITOR Take 1 tablet (80 mg total) by mouth daily at 6 PM.   carvedilol 6.25 MG tablet Commonly known as: COREG Take 1 tablet (6.25 mg total) by mouth 2 (two) times daily with a meal.   cetirizine 10 MG tablet Commonly known as: ZYRTEC Take 1 tablet (10 mg total) by mouth daily.   dapagliflozin propanediol 5 MG Tabs tablet Commonly known as: FARXIGA Take 1 tablet (5 mg total) by mouth daily.   fluticasone 50 MCG/ACT nasal spray Commonly known as: FLONASE SHAKE LIQUID AND USE 1 SPRAY IN EACH NOSTRIL DAILY What changed: See the new instructions.   Lantus SoloStar 100 UNIT/ML Solostar Pen Generic drug: insulin glargine Inject 55 Units into the skin daily after breakfast. What changed: how much to take   magnesium citrate Soln Take 1 Bottle by mouth once as needed for mild constipation or moderate constipation.   nitroGLYCERIN 0.4 MG SL tablet Commonly known as: NITROSTAT Place 1 tablet (0.4 mg total) under the tongue every 5 (five) minutes as needed for chest pain.   pantoprazole 40 MG tablet Commonly known as: PROTONIX Take 40 mg by mouth 2 (two) times daily.   sacubitril-valsartan 24-26 MG Commonly known as: ENTRESTO Take 1 tablet by mouth 2 (two) times daily.    ticagrelor 90 MG Tabs tablet Commonly known as: BRILINTA Take 1 tablet (90 mg total) by mouth 2 (  two) times daily.   Ventolin HFA 108 (90 Base) MCG/ACT inhaler Generic drug: albuterol Inhale 2 puffs into the lungs every 6 (six) hours as needed for wheezing or shortness of breath.        Aspirin prescribed at discharge?  Yes High Intensity Statin Prescribed? (Lipitor 40-80mg  or Crestor 20-40mg ): Yes Beta Blocker Prescribed? Yes For EF <40%, was ACEI/ARB Prescribed? Yes ADP Receptor Inhibitor Prescribed? (i.e. Plavix etc.-Includes Medically Managed Patients): Yes For EF <40%, Aldosterone Inhibitor Prescribed? No: N/A Was EF assessed during THIS hospitalization? Yes Was Cardiac Rehab II ordered? (Included Medically managed Patients): Yes   Outstanding Labs/Studies   BMET at follow-up Repeat FLP/LFTs in 6 weeks.   Duration of Discharge Encounter   Greater than 30 minutes including physician time.  Signed, Beatriz Stallion PA-C 06/01/2020, 11:58 AM

## 2020-06-01 NOTE — Plan of Care (Signed)
  Problem: Education: Goal: Knowledge of General Education information will improve Description: Including pain rating scale, medication(s)/side effects and non-pharmacologic comfort measures Outcome: Adequate for Discharge   Problem: Health Behavior/Discharge Planning: Goal: Ability to manage health-related needs will improve Outcome: Adequate for Discharge   Problem: Clinical Measurements: Goal: Ability to maintain clinical measurements within normal limits will improve Outcome: Adequate for Discharge Goal: Will remain free from infection Outcome: Adequate for Discharge Goal: Diagnostic test results will improve Outcome: Adequate for Discharge Goal: Respiratory complications will improve Outcome: Adequate for Discharge Goal: Cardiovascular complication will be avoided Outcome: Adequate for Discharge   Problem: Activity: Goal: Risk for activity intolerance will decrease Outcome: Adequate for Discharge   Problem: Nutrition: Goal: Adequate nutrition will be maintained Outcome: Adequate for Discharge   Problem: Coping: Goal: Level of anxiety will decrease Outcome: Adequate for Discharge   Problem: Elimination: Goal: Will not experience complications related to bowel motility Outcome: Adequate for Discharge Goal: Will not experience complications related to urinary retention Outcome: Adequate for Discharge   Problem: Pain Managment: Goal: General experience of comfort will improve Outcome: Adequate for Discharge   Problem: Safety: Goal: Ability to remain free from injury will improve Outcome: Adequate for Discharge   Problem: Skin Integrity: Goal: Risk for impaired skin integrity will decrease Outcome: Adequate for Discharge   Problem: Education: Goal: Understanding of cardiac disease, CV risk reduction, and recovery process will improve Outcome: Adequate for Discharge Goal: Individualized Educational Video(s) Outcome: Adequate for Discharge   Problem:  Activity: Goal: Ability to tolerate increased activity will improve Outcome: Adequate for Discharge   Problem: Cardiac: Goal: Ability to achieve and maintain adequate cardiovascular perfusion will improve Outcome: Adequate for Discharge   Problem: Health Behavior/Discharge Planning: Goal: Ability to safely manage health-related needs after discharge will improve Outcome: Adequate for Discharge   Problem: Education: Goal: Understanding of CV disease, CV risk reduction, and recovery process will improve Outcome: Adequate for Discharge Goal: Individualized Educational Video(s) Outcome: Adequate for Discharge   Problem: Activity: Goal: Ability to return to baseline activity level will improve Outcome: Adequate for Discharge   Problem: Cardiovascular: Goal: Ability to achieve and maintain adequate cardiovascular perfusion will improve Outcome: Adequate for Discharge Goal: Vascular access site(s) Level 0-1 will be maintained Outcome: Adequate for Discharge   Problem: Health Behavior/Discharge Planning: Goal: Ability to safely manage health-related needs after discharge will improve Outcome: Adequate for Discharge   Problem: Education: Goal: Understanding of CV disease, CV risk reduction, and recovery process will improve Outcome: Adequate for Discharge Goal: Individualized Educational Video(s) Outcome: Adequate for Discharge   Problem: Activity: Goal: Ability to return to baseline activity level will improve Outcome: Adequate for Discharge   Problem: Cardiovascular: Goal: Ability to achieve and maintain adequate cardiovascular perfusion will improve Outcome: Adequate for Discharge Goal: Vascular access site(s) Level 0-1 will be maintained Outcome: Adequate for Discharge   Problem: Health Behavior/Discharge Planning: Goal: Ability to safely manage health-related needs after discharge will improve Outcome: Adequate for Discharge

## 2020-06-01 NOTE — Progress Notes (Signed)
Patient verbalized experiencing blood clot being release from his nose as well as bloody urine. neither were observed. Explained to notify the RN or staff when he experience it again.

## 2020-06-01 NOTE — Progress Notes (Signed)
Inpatient Diabetes Program Recommendations  AACE/ADA: New Consensus Statement on Inpatient Glycemic Control (2015)  Target Ranges:  Prepandial:   less than 140 mg/dL      Peak postprandial:   less than 180 mg/dL (1-2 hours)      Critically ill patients:  140 - 180 mg/dL     Results for [name] Merritt, [name] Merritt (MRN [id number]) as of 05/31/2020 08:21  Ref. Range 12/12/2019 00:00 05/30/2020 09:52  Hemoglobin A1C Latest Ref Range: 4.8 - 5.6 % 9.6 9.8 (H)  (234 mg/dl)   Results for [name] Merritt, [name] Merritt (MRN [id number]) as of 06/01/2020 09:53  Ref. Range 05/31/2020 16:24 05/31/2020 21:18 06/01/2020 06:05  Glucose-Capillary Latest Ref Range: 70 - 99 mg/dL 630 (H) 160 (H) 109 (H)   Admit: STEMI  History: DM  Home DM Meds: Lantus 40 units Daily  Current Orders: Lantus 50 units QHS      Novolog Sensitive Correction Scale/ SSI (0-9 units) TID AC + HS      Novolog 6 units tid meal coverage   MD-  Please consider the following In-hospital insulin adjustments:  -  Increase the Lantus to 55 units QHS   May need more Lantus at home (currently only taking 40 units Daily) and may benefit from addition of Jardiance per Cardiology notes.  Pt also requests new CBG meter at time of d/c home. Order # [id number]  DM Coordinator spoke with pt on 8/9  Thanks,  Christena Deem RN, MSN, BC-ADM Inpatient Diabetes Coordinator Team Pager [phone] (8a-5p)

## 2020-06-01 NOTE — Research (Addendum)
AEGIS V1 and V2   Inclusion/Exclusion Checklist:   Inclusions:   Y N   '[x]'$  '[]'$  Male or male at least 48 years of age  '[x]'$  '[]'$  Evidence of type I (spontaneous) MI (STEMI or NSTEMI) caused by atherothrombotic artery disease as defined by the following:  '[x]'$  '[]'$  a. Detection of a rise and/or fall in Troponin I or T with at least 1 value about the 99% upper reference limit.     (AND)---  Any 1 or more of the following:   '[x]'$  '[]'$  - symptoms of ischemia (ie, resulting from a primary coronary    artery event)  '[]'$  '[]'$  - New or presumably new significant ST/T wave changes or left bundle branch block.  '[]'$  '[]'$       - Development of pathological Q waves on EKG  '[]'$  '[]'$  - Imaging evidence of new loss or viable myocardium or regional wall motion abnormality.  '[]'$  '[]'$  - ID of intracoronary thrombus by angiography.  '[x]'$  '[]'$  No suspicion of acute kidney injury at least 12 hours after angiography OR after first medical contract for subject's not undergoing angiography There must be documented evidence of stable renal function defined as no more than an increase in Serum Creatinine < 0.'3mg'$ /dl from pre-contrast serum creatinine value.  (Before _1.30_   12 hrs after _1.28_)    Evidence of multi-vessel coronary artery disease defined as:  '[x]'$  '[]'$  A. At least 50% stenosis of theleft main coronary artery or at least 2 epicardial coronary artery territories (LAD, LCx, RCA) on catherization performed during the index hospitalization.   '[]'$  '[]'$  B. Prior cardiac catherization documenting @ least 50% stenosis of the LM or at least 2 epicardial >1 epicardial artery territories (LAD, LCx, RCA)  '[]'$  '[]'$  C. Prior PCI and evidence of 50% stenosis of at least 1 epicardial coronary artery territory different from prior revascularized artery territory.   '[]'$  '[]'$  D. Prior multivessel coronary artery bypass grafting.  '[x]'$  '[]'$  Plus either Established risk factors:  '[x]'$  '[]'$  o On pharmacological treatment for diabetes mellitus                 OR     TWO of the following  '[]'$  '[]'$  o Prior history of MI  '[]'$  '[]'$  o Age ? 65 years  '[]'$  '[]'$  o Peripheral arterial disease defined as meeting at least 1 of the following criteria:  '[]'$  '[]'$          +   Current intermittent claudication or resting limb ischemia and ABI    ?0.90  '[]'$  '[]'$          +   History of peripheral revascularization (surgical or percutaneous)  '[]'$  '[]'$          +  History of limb amputation due to PAD  '[]'$  '[]'$          +  Angiographic evidence (using computed tomographic angiography, MRA, or invasive angiography or a peripheral artery stenosis ?50%.  '[]'$  '[]'$  If the male subject without child bearing potential, not breastfeeding, not pregnant, and if of child bearing potential agree to contraception or lifestyle methods to avoid pregnancy? Child-bearing potential (must select all)   ____ not pregnant (by urine or serum hCG AND   ____ willing to use an acceptable method of contraception to avoid pregnancy during the study and for 3 months after last dose of investional product (refer to acceptable methods per protocol)   ____ if breastfeeding, willing to cease breastfeeding Date of pregnancy test: (____ /  _____/ ________)  Result  - or + Not of Child bearing potential (select one)   ____ Age >= 29   ____ Age 11-60 with amenorrhea for at least 1 year with documented evidence of follicle-stimulating hormone level >40 IU/L   ____ Surgically sterile for at least 3 months prior to randomization  '[x]'$  '[]'$  Investigator believes that the subject is willing and able to adhere to all protocol requirements.   '[x]'$  '[]'$  Willing to not participate in another investigational study until completion of their final study visit.     Exclusions:  Y N   '[]'$  '[x]'$  If these are the reason for MI (pt is excluded)  '[]'$  '[x]'$  1. Myocardial necrosis due mismatch between myocardial oxygen demand and supply, usually due to fixed coronary disease with increased demand leading to MI  '[]'$  '[x]'$  2. Cardiac death due to MI  '[]'$  '[x]'$   3. Myocardial necrosis due to complications from a PCI  '[]'$  '[x]'$  4. Myocardial necrosis due to stent thrombosis  '[]'$  '[x]'$  5. Myocardial necrosis due to in stent restenosis as the only etiology  '[]'$  '[x]'$  6. Myocardial necrosis in the stenting of coronary artery bypass grafting  '[]'$  '[x]'$  Ongoing hemodynamic instability  '[]'$  '[x]'$       +  History of NYHA Class III or IV heart failure within the last year  '[]'$  '[x]'$       +  [place]ip Class III or IV heart failure  '[]'$  '[x]'$       +  Sustained and/or symptomatic hypotension (SBP <90 mm HG)  '[]'$  '[x]'$       +  Known left ventricular ejection fraction of <30%  '[]'$  '[x]'$  Evidence of hepatobiliary disease as indicated by any 1 or more of the   following at screening:  '[]'$  '[x]'$       +  Current active hepatic dysfunction or active biliary obstruction  '[]'$  '[x]'$       +       +  Chronic or prior history of cirrhosis or of infectious / inflammatory hepatitis NOTE: If a patient has a medical history of recovered Hep A, B, or C without evidence of cirrhosis, he/she could be considered for inclusion if there is documented evidence that there is no active infection (ie, antigen negative)  '[]'$  '[x]'$       + Hepatic lab abnormalities: ALT > 3 x ULN or Total bilirubin > 2x ULN at randomization.  '[]'$  '[x]'$  Severe chronic kidney disease (eGFR of <75m) or on dialysis  '[]'$  '[x]'$  Plan to undergo scheduled coronary artery bypass graft surgery after randomization, as determined at the time of screening  '[]'$  '[x]'$  Known history of allergies to soybeans, peanuts, albumin  '[]'$  '[x]'$  Body weight <50 kg  '[]'$  '[x]'$  A known history of IgA deficiency or antibodies to IgA  '[]'$  '[x]'$  A comorbid condition with an estimated life expectancy of ? 6 months at time of consent  '[]'$  '[x]'$  Women who are pregnant or breastfeeding at time of randomization  '[]'$  '[x]'$  Participated in another interventional clinical study at the time of consent  '[]'$  '[x]'$  Treatment with anticancer therapy  '[]'$  '[x]'$  Previously randomized or participated in this study or  previously exposed to CSL112    Component     Latest Ref Rng & Units 05/30/2020 05/30/2020 05/31/2020 06/01/2020         9:52 AM PRE CATH  11:02 AM  1:06 AM 11:09 AM POST CATH  Sodium     135 - 145 mmol/L 131 (L) 135  130 (L) 134 (L)  Potassium     3.5 - 5.1 mmol/L 4.4 4.1 4.1 4.1  Chloride     98 - 111 mmol/L 94 (L) 95 (L) 95 (L) 96 (L)  CO2     22 - 32 mmol/L '23  24 26  '$ Glucose     70 - 99 mg/dL 462 (H) 453 (H) 332 (H) 219 (H)  BUN     6 - 20 mg/dL '16 19 18 21 '$ (H)  Creatinine     0.61 - 1.24 mg/dL 1.30 (H) 1.10 1.23 1.28 (H)  Calcium     8.9 - 10.3 mg/dL 9.4  8.7 (L) 8.8 (L)  GFR, Est Non African American     >60 mL/min >60  >60 >60  GFR, Est African American     >60 mL/min >60  >60 >60  Anion gap     5 - '15 14  11 12   '$ Component     Latest Ref Rng & Units 05/30/2020         9:52 AM  Sodium     135 - 145 mmol/L 131 (L)  Potassium     3.5 - 5.1 mmol/L 4.4  Chloride     98 - 111 mmol/L 94 (L)  CO2     22 - 32 mmol/L 23  Glucose     70 - 99 mg/dL 462 (H)  BUN     6 - 20 mg/dL 16  Creatinine     0.61 - 1.24 mg/dL 1.30 (H)  Calcium     8.9 - 10.3 mg/dL 9.4  Total Protein     6.5 - 8.1 g/dL 8.0  Albumin     3.5 - 5.0 g/dL 3.7  AST     15 - 41 U/L 74 (H)  ALT     0 - 44 U/L 31  Alkaline Phosphatase     38 - 126 U/L 62  Total Bilirubin     0.3 - 1.2 mg/dL 1.1  GFR, Est Non African American     >60 mL/min >60  GFR, Est African American     >60 mL/min >60  Anion gap     5 - 15 14

## 2020-06-01 NOTE — Progress Notes (Signed)
D/C instructions given and reviewed with pt and spouse at bedside. Questions answered and encouraged to call with any f/u questions. IV med infusing at this time.

## 2020-06-01 NOTE — Telephone Encounter (Signed)
Patient is still currently admitted. 

## 2020-06-01 NOTE — Care Management (Signed)
Patient's wife provided with [facility] cards. Confirmed she already received Brilinta ards

## 2020-06-02 ENCOUNTER — Telehealth: Payer: Self-pay | Admitting: Cardiovascular Disease

## 2020-06-02 NOTE — Telephone Encounter (Signed)
Patient contacted regarding discharge from [facility] hospital on 06/01/20.  Patient understands to follow up with provider Marjie Skiff PA on 06/21/20 at 11:15 am. Patient understands discharge instructions. Patient understands medications and regiment. Patient understands to bring all medications to this visit.

## 2020-06-02 NOTE — Telephone Encounter (Signed)
Received  Forms from [employer] for Dr. Nicki Guadalajara , placed form in Dr. Landry Dyke mailbox   06/02/20  fsw

## 2020-06-02 NOTE — Telephone Encounter (Deleted)
Received  Forms from [employer] for Dr. Thomas Kelly , placed form in Dr. Kelly's mailbox   06/02/20  fsw 

## 2020-06-03 ENCOUNTER — Encounter (HOSPITAL_COMMUNITY): Payer: Self-pay | Admitting: Cardiovascular Disease

## 2020-06-04 DIAGNOSIS — I2102 ST elevation (STEMI) myocardial infarction involving left anterior descending coronary artery: Secondary | ICD-10-CM | POA: Diagnosis not present

## 2020-06-04 DIAGNOSIS — E1169 Type 2 diabetes mellitus with other specified complication: Secondary | ICD-10-CM | POA: Diagnosis not present

## 2020-06-04 DIAGNOSIS — I5021 Acute systolic (congestive) heart failure: Secondary | ICD-10-CM | POA: Diagnosis not present

## 2020-06-04 DIAGNOSIS — I255 Ischemic cardiomyopathy: Secondary | ICD-10-CM | POA: Diagnosis not present

## 2020-06-05 LAB — CULTURE, BLOOD (ROUTINE X 2)
Culture: NO GROWTH
Special Requests: ADEQUATE

## 2020-06-07 ENCOUNTER — Telehealth (HOSPITAL_COMMUNITY): Payer: Self-pay

## 2020-06-07 NOTE — Telephone Encounter (Signed)
Attempted to call patient in regards to Cardiac Rehab - LM on VM 

## 2020-06-07 NOTE — Telephone Encounter (Signed)
Pt insurance is active and benefits verified through [employer]. Co-pay $0.00, DED $2,000.00/$196.66 met, out of pocket $5,000.00/$542.19 met, co-insurance 20%. No pre-authorization required. [employer]port, 06/07/20 @ 10:11AM, SQZ#[id number]  Will contact patient to see if he is interested in the Cardiac Rehab Program. If interested, patient will need to complete follow up appt. Once completed, patient will be contacted for scheduling upon review by the RN Navigator.

## 2020-06-08 ENCOUNTER — Emergency Department (HOSPITAL_COMMUNITY): Payer: BC Managed Care – PPO

## 2020-06-08 ENCOUNTER — Inpatient Hospital Stay (HOSPITAL_COMMUNITY)
Admission: EM | Admit: 2020-06-08 | Discharge: 2020-06-11 | DRG: 281 | Disposition: A | Discharge status: Home / Self Care | Payer: BC Managed Care – PPO | Source: Ambulatory Visit | Attending: Cardiovascular Disease | Admitting: Cardiovascular Disease

## 2020-06-08 ENCOUNTER — Ambulatory Visit: Payer: Self-pay | Admitting: Endocrinology

## 2020-06-08 ENCOUNTER — Ambulatory Visit (HOSPITAL_COMMUNITY)
Admission: RE | Admit: 2020-06-08 | Discharge: 2020-06-08 | Disposition: A | Discharge status: Home / Self Care | Payer: BC Managed Care – PPO | Source: Ambulatory Visit | Attending: Internal Medicine | Admitting: Internal Medicine

## 2020-06-08 ENCOUNTER — Other Ambulatory Visit: Payer: Self-pay

## 2020-06-08 ENCOUNTER — Encounter (HOSPITAL_COMMUNITY): Payer: Self-pay | Admitting: Emergency Medicine

## 2020-06-08 DIAGNOSIS — Z794 Long term (current) use of insulin: Secondary | ICD-10-CM | POA: Diagnosis not present

## 2020-06-08 DIAGNOSIS — I5022 Chronic systolic (congestive) heart failure: Secondary | ICD-10-CM | POA: Diagnosis present

## 2020-06-08 DIAGNOSIS — J9811 Atelectasis: Secondary | ICD-10-CM | POA: Diagnosis not present

## 2020-06-08 DIAGNOSIS — E1165 Type 2 diabetes mellitus with hyperglycemia: Secondary | ICD-10-CM | POA: Diagnosis present

## 2020-06-08 DIAGNOSIS — E119 Type 2 diabetes mellitus without complications: Secondary | ICD-10-CM | POA: Diagnosis not present

## 2020-06-08 DIAGNOSIS — I4892 Unspecified atrial flutter: Secondary | ICD-10-CM | POA: Diagnosis not present

## 2020-06-08 DIAGNOSIS — E871 Hypo-osmolality and hyponatremia: Secondary | ICD-10-CM | POA: Diagnosis present

## 2020-06-08 DIAGNOSIS — Z8249 Family history of ischemic heart disease and other diseases of the circulatory system: Secondary | ICD-10-CM | POA: Diagnosis not present

## 2020-06-08 DIAGNOSIS — Z833 Family history of diabetes mellitus: Secondary | ICD-10-CM

## 2020-06-08 DIAGNOSIS — R05 Cough: Secondary | ICD-10-CM | POA: Diagnosis not present

## 2020-06-08 DIAGNOSIS — I480 Paroxysmal atrial fibrillation: Secondary | ICD-10-CM

## 2020-06-08 DIAGNOSIS — I25119 Atherosclerotic heart disease of native coronary artery with unspecified angina pectoris: Secondary | ICD-10-CM | POA: Diagnosis not present

## 2020-06-08 DIAGNOSIS — Z87891 Personal history of nicotine dependence: Secondary | ICD-10-CM

## 2020-06-08 DIAGNOSIS — D649 Anemia, unspecified: Secondary | ICD-10-CM | POA: Diagnosis present

## 2020-06-08 DIAGNOSIS — I255 Ischemic cardiomyopathy: Secondary | ICD-10-CM | POA: Diagnosis present

## 2020-06-08 DIAGNOSIS — E7800 Pure hypercholesterolemia, unspecified: Secondary | ICD-10-CM | POA: Diagnosis present

## 2020-06-08 DIAGNOSIS — Z20822 Contact with and (suspected) exposure to covid-19: Secondary | ICD-10-CM | POA: Diagnosis present

## 2020-06-08 DIAGNOSIS — Z888 Allergy status to other drugs, medicaments and biological substances status: Secondary | ICD-10-CM

## 2020-06-08 DIAGNOSIS — I2510 Atherosclerotic heart disease of native coronary artery without angina pectoris: Secondary | ICD-10-CM | POA: Diagnosis present

## 2020-06-08 DIAGNOSIS — Z7902 Long term (current) use of antithrombotics/antiplatelets: Secondary | ICD-10-CM

## 2020-06-08 DIAGNOSIS — Z955 Presence of coronary angioplasty implant and graft: Secondary | ICD-10-CM | POA: Diagnosis not present

## 2020-06-08 DIAGNOSIS — R000 Tachycardia, unspecified: Secondary | ICD-10-CM | POA: Diagnosis not present

## 2020-06-08 DIAGNOSIS — I110 Hypertensive heart disease with heart failure: Secondary | ICD-10-CM | POA: Diagnosis not present

## 2020-06-08 DIAGNOSIS — I959 Hypotension, unspecified: Secondary | ICD-10-CM | POA: Diagnosis present

## 2020-06-08 DIAGNOSIS — I10 Essential (primary) hypertension: Secondary | ICD-10-CM | POA: Diagnosis not present

## 2020-06-08 DIAGNOSIS — Z79899 Other long term (current) drug therapy: Secondary | ICD-10-CM | POA: Diagnosis not present

## 2020-06-08 DIAGNOSIS — G4733 Obstructive sleep apnea (adult) (pediatric): Secondary | ICD-10-CM | POA: Diagnosis present

## 2020-06-08 DIAGNOSIS — Z6838 Body mass index (BMI) 38.0-38.9, adult: Secondary | ICD-10-CM | POA: Diagnosis not present

## 2020-06-08 DIAGNOSIS — I2102 ST elevation (STEMI) myocardial infarction involving left anterior descending coronary artery: Secondary | ICD-10-CM | POA: Diagnosis present

## 2020-06-08 DIAGNOSIS — Z7982 Long term (current) use of aspirin: Secondary | ICD-10-CM

## 2020-06-08 DIAGNOSIS — E785 Hyperlipidemia, unspecified: Secondary | ICD-10-CM | POA: Diagnosis not present

## 2020-06-08 DIAGNOSIS — Z91018 Allergy to other foods: Secondary | ICD-10-CM

## 2020-06-08 DIAGNOSIS — R059 Cough, unspecified: Secondary | ICD-10-CM | POA: Diagnosis present

## 2020-06-08 HISTORY — DX: Atherosclerotic heart disease of native coronary artery without angina pectoris: I25.10

## 2020-06-08 HISTORY — DX: Acute myocardial infarction, unspecified: I21.9

## 2020-06-08 LAB — BASIC METABOLIC PANEL
Anion gap: 11 (ref 5–15)
BUN: 16 mg/dL (ref 6–20)
CO2: 21 mmol/L — ABNORMAL LOW (ref 22–32)
Calcium: 8.7 mg/dL — ABNORMAL LOW (ref 8.9–10.3)
Chloride: 102 mmol/L (ref 98–111)
Creatinine, Ser: 1.17 mg/dL (ref 0.61–1.24)
GFR calc Af Amer: >60 mL/min (ref >60)
GFR calc non Af Amer: >60 mL/min (ref >60)
Glucose, Bld: 174 mg/dL — ABNORMAL HIGH (ref 70–99)
Potassium: 4.5 mmol/L (ref 3.5–5.1)
Sodium: 134 mmol/L — ABNORMAL LOW (ref 135–145)

## 2020-06-08 LAB — TROPONIN I (HIGH SENSITIVITY)
Troponin I (High Sensitivity): 252 ng/L (ref <18)
Troponin I (High Sensitivity): 241 ng/L (ref <18)

## 2020-06-08 LAB — SARS CORONAVIRUS 2 BY RT PCR (HOSPITAL ORDER, PERFORMED IN ~~LOC~~ HOSPITAL LAB): SARS Coronavirus 2: NEGATIVE

## 2020-06-08 LAB — CBC
HCT: 37.7 % — ABNORMAL LOW (ref 39.0–52.0)
Hemoglobin: 12 g/dL — ABNORMAL LOW (ref 13.0–17.0)
MCH: 30.1 pg (ref 26.0–34.0)
MCHC: 31.8 g/dL (ref 30.0–36.0)
MCV: 94.5 fL (ref 80.0–100.0)
Platelets: 576 10*3/uL — ABNORMAL HIGH (ref 150–400)
RBC: 3.99 MIL/uL — ABNORMAL LOW (ref 4.22–5.81)
RDW: 12.1 % (ref 11.5–15.5)
WBC: 11.3 10*3/uL — ABNORMAL HIGH (ref 4.0–10.5)
nRBC: 0 % (ref 0.0–0.2)

## 2020-06-08 LAB — HIV ANTIBODY (ROUTINE TESTING W REFLEX): HIV Screen 4th Generation wRfx: NONREACTIVE

## 2020-06-08 MED ORDER — DILTIAZEM HCL-DEXTROSE 125-5 MG/125ML-% IV SOLN (PREMIX)
5.0000 mg/h | INTRAVENOUS | Status: DC
  Administered 2020-06-08: 5 mg/h via INTRAVENOUS
  Administered 2020-06-08: 12.5 mg/h via INTRAVENOUS
  Administered 2020-06-09 (×2): 10 mg/h via INTRAVENOUS
  Administered 2020-06-10: 5 mg/h via INTRAVENOUS
  Filled 2020-06-08 (×5): qty 125

## 2020-06-08 MED ORDER — CLOPIDOGREL BISULFATE 75 MG PO TABS
75.0000 mg | ORAL_TABLET | Freq: Every day | ORAL | Status: DC
  Administered 2020-06-09 – 2020-06-11 (×3): 75 mg via ORAL
  Filled 2020-06-08 (×4): qty 1

## 2020-06-08 MED ORDER — DAPAGLIFLOZIN PROPANEDIOL 5 MG PO TABS
5.0000 mg | ORAL_TABLET | Freq: Every day | ORAL | Status: DC
  Administered 2020-06-09 – 2020-06-11 (×3): 5 mg via ORAL
  Filled 2020-06-08 (×3): qty 1

## 2020-06-08 MED ORDER — ACETAMINOPHEN 325 MG PO TABS
650.0000 mg | ORAL_TABLET | ORAL | Status: DC | PRN
  Administered 2020-06-09 – 2020-06-11 (×6): 650 mg via ORAL
  Filled 2020-06-08 (×6): qty 2

## 2020-06-08 MED ORDER — APIXABAN 5 MG PO TABS
5.0000 mg | ORAL_TABLET | Freq: Two times a day (BID) | ORAL | Status: DC
  Administered 2020-06-08 – 2020-06-11 (×6): 5 mg via ORAL
  Filled 2020-06-08 (×6): qty 1

## 2020-06-08 MED ORDER — ASPIRIN 81 MG PO CHEW
81.0000 mg | CHEWABLE_TABLET | Freq: Every day | ORAL | Status: DC
  Administered 2020-06-09 – 2020-06-11 (×3): 81 mg via ORAL
  Filled 2020-06-08 (×3): qty 1

## 2020-06-08 MED ORDER — ONDANSETRON HCL 4 MG/2ML IJ SOLN
4.0000 mg | Freq: Once | INTRAMUSCULAR | Status: AC
  Administered 2020-06-08: 4 mg via INTRAVENOUS
  Filled 2020-06-08: qty 2

## 2020-06-08 MED ORDER — ATORVASTATIN CALCIUM 80 MG PO TABS
80.0000 mg | ORAL_TABLET | Freq: Every day | ORAL | Status: DC
  Administered 2020-06-09 – 2020-06-10 (×2): 80 mg via ORAL
  Filled 2020-06-08 (×2): qty 1

## 2020-06-08 MED ORDER — ONDANSETRON HCL 4 MG/2ML IJ SOLN: 4.0000 mg | Freq: Four times a day (QID) | INTRAMUSCULAR | Status: DC | PRN

## 2020-06-08 MED ORDER — STUDY - AEGIS II STUDY - PLACEBO OR CSL112 (PI-HILTY)
170.0000 mL | Freq: Once | INTRAVENOUS | Status: DC
  Filled 2020-06-08: qty 170

## 2020-06-08 MED ORDER — PANTOPRAZOLE SODIUM 40 MG PO TBEC
40.0000 mg | DELAYED_RELEASE_TABLET | Freq: Two times a day (BID) | ORAL | Status: DC
  Administered 2020-06-09 – 2020-06-11 (×5): 40 mg via ORAL
  Filled 2020-06-08 (×5): qty 1

## 2020-06-08 MED ORDER — INSULIN GLARGINE 100 UNIT/ML ~~LOC~~ SOLN
55.0000 [IU] | Freq: Every day | SUBCUTANEOUS | Status: DC
  Filled 2020-06-08: qty 0.55

## 2020-06-08 MED ORDER — METOPROLOL SUCCINATE ER 25 MG PO TB24
25.0000 mg | ORAL_TABLET | Freq: Every day | ORAL | Status: DC
  Administered 2020-06-08 – 2020-06-11 (×4): 25 mg via ORAL
  Filled 2020-06-08 (×4): qty 1

## 2020-06-08 MED ORDER — CLOPIDOGREL BISULFATE 300 MG PO TABS
300.0000 mg | ORAL_TABLET | Freq: Every day | ORAL | Status: AC
  Administered 2020-06-08: 300 mg via ORAL
  Filled 2020-06-08: qty 1

## 2020-06-08 NOTE — H&P (Addendum)
Cardiology Admission History and Physical:   Patient ID: [name] Merritt MRN: [id number]; DOB: 30-May-1971   Admission date: 06/08/2020  Primary Care Provider: Renford Dills, MD [facility] Cardiologist: Nicki Guadalajara, MD   Chief Complaint:  Atrial fibrillation with RVR  Patient Profile:   [name] Merritt is a 49 y.o. male with a hx of recent anterior STEMI found to have an occluded LAD which was successfully stented with DES/PCI placement (with residual 50% stenosis in OM and 70% stenosis in left PDA). Also with a hx of DM2, HLD, and HTN who is being seen today for the evaluation of new onset atrial flutter.  History of Present Illness:   [name] Merritt is a 49yo M with a hx as stated above who presented from the cardiac infusion clinic after being found to be tachycardic on telemetry. EKG performed at that time that showed atrial flutter with RVR with a rate at 158bpm.  Patient and wife report that since hospital discharge on 06/01/2020 he has had persistent low-grade fevers, has had dyspneia on exertion and has been rather fatigued. Patient reports chronic propductive cough that worsens with lying flat and in the heat. He states that his cough has been present for at least one year. He has an appointment with pulmonary medicine but has unfortunately missed this due to his hospitalization.This seems to be his most bothersome symptom at this time. He denies chest pain, LE edema, orthopnea, dizziness, palpitations, or syncope.   In the ED, EKG shows atrial flutter with a rate of 158 bpm. WBC remains elevated at 11.3. Temp on ED arrival at 99.3. Previously admission with leukocystosis and fever as well, felt to be secondary to cardiac infarct. Blood and urine cultures were negative. Plans were for OTC tylenol and cough suppressant which have not helped. Creatinine stable at 1.17. HS Troponin elevated at 252>>>likely down trending from peak HST 05/30/20 at 7578.   Past Medical History:  Diagnosis  Date  . Diabetes mellitus without complication (HCC)   . High cholesterol   . Hypertension   . Overweight     Past Surgical History:  Procedure Laterality Date  . CORONARY STENT INTERVENTION N/A 05/30/2020   Procedure: CORONARY STENT INTERVENTION;  Surgeon: Lennette Bihari, MD;  Location: [facility] INVASIVE CV LAB;  Service: Cardiovascular;  Laterality: N/A;  . CORONARY/GRAFT ACUTE MI REVASCULARIZATION N/A 05/30/2020   Procedure: Coronary/Graft Acute MI Revascularization;  Surgeon: Lennette Bihari, MD;  Location: [facility] INVASIVE CV LAB;  Service: Cardiovascular;  Laterality: N/A;  . LEFT HEART CATH AND CORONARY ANGIOGRAPHY N/A 05/30/2020   Procedure: LEFT HEART CATH AND CORONARY ANGIOGRAPHY;  Surgeon: Lennette Bihari, MD;  Location: [facility] INVASIVE CV LAB;  Service: Cardiovascular;  Laterality: N/A;  . torn rotator cuff  2018   2018     Medications Prior to Admission: Prior to Admission medications   Medication Sig Start Date End Date Taking? Authorizing Provider  albuterol (VENTOLIN HFA) 108 (90 Base) MCG/ACT inhaler Inhale 2 puffs into the lungs every 6 (six) hours as needed for wheezing or shortness of breath.   Yes [provider]  aspirin 81 MG chewable tablet Chew 1 tablet (81 mg total) by mouth daily. 06/02/20  Yes Kroeger, Dot Lanes M., PA-C  atorvastatin (LIPITOR) 80 MG tablet Take 1 tablet (80 mg total) by mouth daily at 6 PM. 06/01/20  Yes Kroeger, Dot Lanes M., PA-C  carvedilol (COREG) 6.25 MG tablet Take 1 tablet (6.25 mg total) by mouth 2 (two) times daily with a meal.  06/01/20  Yes Kroeger, Ovidio Kin., PA-C  cetirizine (ZYRTEC) 10 MG tablet Take 1 tablet (10 mg total) by mouth daily. 02/17/20  Yes Charlott Holler, MD  dapagliflozin propanediol (FARXIGA) 5 MG TABS tablet Take 1 tablet (5 mg total) by mouth daily. 06/01/20  Yes Kroeger, Dot Lanes M., PA-C  fluticasone (FLONASE) 50 MCG/ACT nasal spray SHAKE LIQUID AND USE 1 SPRAY IN EACH NOSTRIL DAILY Patient taking differently: Place 2 sprays into  both nostrils daily as needed for allergies or rhinitis.  05/25/20  Yes Charlott Holler, MD  LANTUS SOLOSTAR 100 UNIT/ML Solostar Pen Inject 55 Units into the skin daily after breakfast. 06/01/20  Yes Kroeger, Dot Lanes M., PA-C  magnesium citrate SOLN Take 1 Bottle by mouth once as needed for mild constipation or moderate constipation.    Yes [provider]  pantoprazole (PROTONIX) 40 MG tablet Take 40 mg by mouth 2 (two) times daily.   Yes [provider]  sacubitril-valsartan (ENTRESTO) 24-26 MG Take 1 tablet by mouth 2 (two) times daily. 06/01/20  Yes Kroeger, Ovidio Kin., PA-C  ticagrelor (BRILINTA) 90 MG TABS tablet Take 1 tablet (90 mg total) by mouth 2 (two) times daily. 06/01/20  Yes Kroeger, Ovidio Kin., PA-C  acetaminophen (TYLENOL) 650 MG CR tablet Take 1,300 mg by mouth every 8 (eight) hours as needed for pain.    [provider]  nitroGLYCERIN (NITROSTAT) 0.4 MG SL tablet Place 1 tablet (0.4 mg total) under the tongue every 5 (five) minutes as needed for chest pain. 06/01/20   Kroeger, Ovidio Kin., PA-C     Allergies:    Allergies  Allergen Reactions  . Pineapple Itching and Other (See Comments)    All-over itching (mouth, too)  . Tomato Itching and Other (See Comments)    All-over itching (mouth, too) Ketchup, also  . Rosuvastatin Calcium Hives and Rash    Social History:   Social History   Socioeconomic History  . Marital status: Single    Spouse name: Not on file  . Number of children: Not on file  . Years of education: Not on file  . Highest education level: Not on file  Occupational History  . Not on file  Tobacco Use  . Smoking status: Never Smoker  . Smokeless tobacco: Former Neurosurgeon    Types: Engineer, drilling  . Vaping Use: Never assessed  Substance and Sexual Activity  . Alcohol use: No  . Drug use: No  . Sexual activity: Not on file  Other Topics Concern  . Not on file  Social History Narrative  . Not on file   Social Determinants of  Health   Financial Resource Strain:   . Difficulty of Paying Living Expenses:   Food Insecurity:   . Worried About Programme researcher, broadcasting/film/video in the Last Year:   . Barista in the Last Year:   Transportation Needs:   . Freight forwarder (Medical):   Marland Kitchen Lack of Transportation (Non-Medical):   Physical Activity:   . Days of Exercise per Week:   . Minutes of Exercise per Session:   Stress:   . Feeling of Stress :   Social Connections:   . Frequency of Communication with Friends and Family:   . Frequency of Social Gatherings with Friends and Family:   . Attends Religious Services:   . Active Member of Clubs or Organizations:   . Attends Banker Meetings:   Marland Kitchen Marital Status:   Intimate Programme researcher, broadcasting/film/video  Violence:   . Fear of Current or Ex-Partner:   . Emotionally Abused:   Marland Kitchen Physically Abused:   . Sexually Abused:     Family History:   The patient's family history includes Diabetes in his father, maternal grandfather, maternal grandmother, mother, paternal grandfather, and paternal grandmother; Hypertension in his father, maternal grandfather, maternal grandmother, mother, paternal grandfather, and paternal grandmother. There is no history of Asthma.    ROS:  Please see the history of present illness.  All other ROS reviewed and negative.     Physical Exam/Data:   Vitals:   06/08/20 1317 06/08/20 1332 06/08/20 1337 06/08/20 1342  BP: 93/77 105/86    Pulse: 98 (!) 139 (!) 39 96  Resp: (!) 22 17 (!) 28 (!) 23  Temp:      TempSrc:      SpO2: 99% 98% 96% 97%  Weight:      Height:       No intake or output data in the 24 hours ending 06/08/20 1456 Last 3 Weights 06/08/2020 06/08/2020 06/01/2020  Weight (lbs) 298 lb 298 lb 9.6 oz 306 lb 1.6 oz  Weight (kg) 135.172 kg 135.444 kg 138.846 kg     Body mass index is 38.26 kg/m.   General: Overweight,  NAD Skin: Warm, dry, intact  Neck: Negative for carotid bruits. No JVD Lungs:Clear to ausculation bilaterally. Breathing  is unlabored. Cardiovascular: Irregularly irregular with S1 S2. No murmurs Abdomen: Soft, non-tender, non-distended. No obvious abdominal masses. Extremities: No edema. Radial pulses 2+ bilaterally Neuro: Alert and oriented. No focal deficits. No facial asymmetry. MAE spontaneously. Psych: Responds to questions appropriately with normal affect.    EKG:  The ECG that was done 06/08/20 was personally reviewed and demonstrates atrial flutter with HR 158bpm and no acute changes   Relevant CV Studies:  [facility] 05/30/20  3rd Mrg lesion is 50% stenosed.  LPDA lesion is 70% stenosed.  LPAV-1 lesion is 30% stenosed.  LPAV-2 lesion is 30% stenosed.  1st Diag lesion is 70% stenosed.  Ramus lesion is 20% stenosed.  Mid LAD lesion is 100% stenosed.  Post intervention, there is a 0% residual stenosis.  A stent was successfully placed.  Acute anterolateral ST segment elevation myocardial infarction secondary to total occlusion of the mid LAD with TIMI 0 flow and absence of collaterals.  Concomitant CAD with tortuous 1st diagonal vessel with 70% narrowing beyond a sharp bend in the vessel, 20% narrowing beyond a sharp bend in the ramus immediate vessel, and dominant left circumflex coronary artery with 50% focal OM 3 stenosis, distal 30% stenoses in the AV groove, and focal 70% stenosis in the midportion of a small PLA branch. Normal small nondominant RCA.  LVEDP 12 millimeters Hg.   Successful PCI to the totally occluded mid LAD with ultimate insertion of a Resolute Onyx 3.0 x 22 mm DES stent postdilated to 3.25 mm with digital narrowing of 0% and restoration of TIMI-3 flow.  RECOMMENDATION: DAPT for minimum of 1 year. Aggressive lipid-lowering therapy with titration of atorvastatin to 80 mg daily. We will reinitiate ARB therapy with losartan initially at lower dose, add low-dose beta-blocker therapy, and obtain 2D echo Doppler study in a.m. optimal blood pressure control with target blood  pressure less than 130/80 and ideal blood pressure less than 120/80. We will diabetes control. If patient has LV dysfunction, SGLT 2 inhibition can be considered.   2D echo 05/31/20 1. Akinesis of the mid septum into the apex consistent with LAD  infarction. Contrast imaging shows no evidence of LV thrombus. Left  ventricular ejection fraction, by estimation, is 35 to 40%. The left  ventricle has moderately decreased function. The  left ventricle demonstrates regional wall motion abnormalities (see  scoring diagram/findings for description). Left ventricular diastolic  parameters are consistent with Grade II diastolic dysfunction  (pseudonormalization). Elevated left atrial pressure.  2. Right ventricular systolic function is normal. The right ventricular  size is normal. There is normal pulmonary artery systolic pressure. The  estimated right ventricular systolic pressure is 19.3 mmHg.  3. The mitral valve is grossly normal. Trivial mitral valve  regurgitation. No evidence of mitral stenosis.  4. The aortic valve is tricuspid. Aortic valve regurgitation is not  visualized. Mild aortic valve sclerosis is present, with no evidence of  aortic valve stenosis.  5. The inferior vena cava is normal in size with greater than 50%  respiratory variability, suggesting right atrial pressure of 3 mmHg.   Conclusion(s)/Recommendation(s): Findings consistent with ischemic  cardiomyopathy.   Laboratory Data:  High Sensitivity Troponin:   Recent Labs  Lab 05/30/20 0952 06/08/20 1222  TROPONINIHS 7,578* 252*      Chemistry Recent Labs  Lab 06/08/20 1222  NA 134*  K 4.5  CL 102  CO2 21*  GLUCOSE 174*  BUN 16  CREATININE 1.17  CALCIUM 8.7*  GFRNONAA >60  GFRAA >60  ANIONGAP 11    No results for input(s): PROT, ALBUMIN, AST, ALT, ALKPHOS, BILITOT in the last 168 hours. Hematology Recent Labs  Lab 06/08/20 1222  WBC 11.3*  RBC 3.99*  HGB 12.0*  HCT 37.7*  MCV 94.5  MCH  30.1  MCHC 31.8  RDW 12.1  PLT 576*   BNPNo results for input(s): BNP, PROBNP in the last 168 hours.  DDimer No results for input(s): DDIMER in the last 168 hours.   Radiology/Studies:  DG Chest Portable 1 View  Result Date: 06/08/2020 CLINICAL DATA:  Tachycardia EXAM: PORTABLE CHEST 1 VIEW COMPARISON:  05/30/2020 FINDINGS: The heart size and mediastinal contours are within normal limits. Chronic elevation of the right hemidiaphragm with right basilar atelectasis. Left lung is clear. No pleural effusion or pneumothorax. The visualized skeletal structures are unremarkable. IMPRESSION: Chronic elevation of the right hemidiaphragm with right basilar atelectasis. No acute cardiopulmonary process. Electronically Signed   By: Duanne Guess D.O.   On: 06/08/2020 12:40   Assessment and Plan:   1. New onset atrial flutter with RVR: -Pt presented to [facility] from cardiac infusion clinic after being found to be tachycardic.  EKG performed showed atrial flutter with RVR with a rate at 158 bpm. He has remained asymptomatic with no palpitations or chest pain.  -Repeat EKG with atrial flutter with elevated rates therefore patient placed on diltiazem infusion per EDP>>would plan to transisiton off diltiazem infusion due to ischemic caqrdiomyopathy with LVEF at 35 to 40% -On PTA carvedilol at 6.25mg . Will transition to metoprolol succinate for better rate control and less BP imapct. -BPs are soft therefore will hold Entresto until BP stabilizes.  -Plan to start Eliquis given CHA2DS2VASc= 3 (HTN, CAD, DM2) -Will plan to transition Brilinta to Plavix. Load with  Plavix today and start  QD tomorrow -Will plan for TEE/DCCV while inpatient  -NPO after MDN   2. CAD with recent acute anterior STEMI: -Recent anterior STEMI found to have occluded LAD which was successfully stented with DES/PCI. Also has known residual 50% stenosis in OM and 70% stenosis in left PDA with plans to treat medically -  Denies  recurrent anginal symptoms  -On PTA DAPT with ASA and Brilinta>>>transition off Brilinta and load with 300mg  Plavix today and start 75mg  PO QD tomorrow    3. Ischemic cardiomyopathy: -Echocardiogram performed last admission with LVEF at 35-40%>>placed on Entresto 24/26 with plans to up-titrate in there OP setting  - also added for both CHF and DM indications -Will hold Entresto for now to allow for greater BP>>restart when BP allows   4. Uncontrolled DM2: -Hb A1c, 9.8 on last admission -DM coordinator consulted last admission with plans for Lantus to 55 units qhs and Farxiga. -Needs close follow up with Dr Marcelline Deist -SSI for glucose control while inpatient status   5. Hyperlipidemia: -LDL, 182 on last admission with goal LDL <70 -Continue high dose lipitor 80 mg daily -Recheck lipid panel at next OV    6. HTN: -Low, 104/83>105/86>93/77 -Hold Entresto for now -Transition carvedilol to Toprol XL to allow for greater rate control with less BP impact   8. Morbid obesity with OSA/chronic cough: -Recent dx of sleep apnea by [facility] and is awaiting starting CPAP -Wonder if there is some asthma component to his chronic cough  -May benefit from corticosteroid along with his albuterol  -Needs lifestyle modification    Severity of Illness: The appropriate patient status for this patient is OBSERVATION. Observation status is judged to be reasonable and necessary in order to provide the required intensity of service to ensure the patient's safety. The patient's presenting symptoms, physical exam findings, and initial radiographic and laboratory data in the context of their medical condition is felt to place them at decreased risk for further clinical deterioration. Furthermore, it is anticipated that the patient will be medically stable for discharge from the hospital within 2 midnights of admission. The following factors support the patient status of observation.   " The  patient's presenting symptoms include atrial flutter. " The physical exam findings include cough. " The initial radiographic and laboratory data are atrial flutter.    For questions or updates, please contact [facility] Please consult [email or web address] for contact info under     Signed, 10-27-2002, NP  06/08/2020 2:56 PM   Patient seen, examined. Available data reviewed. Agree with findings, assessment, and plan as outlined by Georgie Chard, NP.  The patient is independently interviewed and examined.  His wife is at the bedside.  The patient is a pleasant, obese male in no distress.  JVP is normal, lung fields are clear with no active wheezing.  Carotid upstrokes are normal without bruits.  Heart is irregularly irregular with no murmur or gallop.  Abdomen is soft and nontender with no masses.  Extremities have no edema.  Troponins are trending down from the time of his infarct with today's values of 252 and 241.  The patient has no active symptoms of ongoing ischemia.  His chest x-ray shows no evidence of congestive heart failure.  EKG shows probable atrial flutter with 2-1 conduction, heart rate 158 bpm.  He has been started on IV Cardizem and now demonstrates better ventricular rate control with variable AV block, clear flutter waves are present.  We do not know when his atrial flutter started, but he did not have this when he was hospitalized 1 week ago.  Other symptoms are well outlined above.  I have recommended the following approach:   Stop ticagrelor because of shortness of breath and indication for chronic oral anticoagulation  Continue diltiazem for now as this is controlling the patient's ventricular rate,  but this will not be a good long-term drug for this patient with severe LV dysfunction.  Would anticipate weaning him off of diltiazem immediately after cardioversion (see below)  Start apixaban 5 mg twice daily for oral anticoagulation, patient's CHA2DS2-VASc = 4 (hypertension,  diabetes, CHF, CAD)  TEE cardioversion at next available time  Transition to clopidogrel as outlined above after recent MI and drug-eluting stent implant  Hold Entresto in the setting of hypotension  Transition carvedilol to metoprolol succinate for better beta-1 selectivity  Plan discussed with patient and wife.  He will be at high risk of recurrence with his obesity and sleep apnea.  Need to work on aggressive lifestyle modification for which they have initiated efforts at home  Tonny Bollman, M.D. 06/08/2020 5:13 PM

## 2020-06-08 NOTE — ED Provider Notes (Addendum)
MSE was initiated and I personally evaluated the patient and placed orders (if any) at  12:04 PM on June 08, 2020.   BP 121/76   Pulse (!) 159   Temp 99.3 F (37.4 C) (Oral)   Resp 17   Ht 6\' 2"  (1.88 m)   Wt 135.2 kg   SpO2 99%   BMI 38.26 kg/m   Patient sent over from cardiac infusion clinic for tachycardia. Patient denies CP, SOB, palpitations. He endorses nausea that has been ongoing since discharged from the hospital. Pt was discharged on 06/01/2020 after STEMI involving the LAD.  On exam he is in no distress.  EKG with sinus tach versus a flutter w RVR.  Labs ordered.  Zofran for nausea. Patient transported to acute care bed.        Aaren Atallah, 08/01/2020 N, PA-C 06/08/20 1225    Marlene Pfluger, 06/10/20 N, PA-C 06/08/20 1225    06/10/20, MD 06/08/20 1535

## 2020-06-08 NOTE — ED Triage Notes (Addendum)
Patient arrives to ED from [facility] infusion clinic with complaints of Afib/Aflutter. Patient states he has no pain and does not feel like his heart is racing. States he feels normal. Before cardiac exam today pts HR was 160's which led to the EKG. EKG given to MD. Recent MI with stent placement last week.

## 2020-06-08 NOTE — ED Notes (Signed)
Dinner ordered 

## 2020-06-08 NOTE — ED Provider Notes (Signed)
[facility] EMERGENCY DEPARTMENT Provider Note   CSN: [id number] Arrival date & time: 06/08/20  1154     History Chief Complaint  Patient presents with  . Tachycardia    [name] Merritt is a 49 y.o. male.  HPI    Patient seen on arrival from cardiology clinic, where he went today for 1 week post discharge evaluation following STEMI involving LAD.  Patient has notable history of cardiac intervention 9 days ago, with stent placement following STEMI. He notes that since discharge she has had mild nausea, persistently.  Today he has no chest pain, no dyspnea, does have ongoing cough, nausea. At the clinic he was found to have substantial tachycardia, sent here for evaluation. Patient denies history of similar events. He is here with his wife who assists with the HPI.    Past Medical History:  Diagnosis Date  . Diabetes mellitus without complication (HCC)   . High cholesterol   . Hypertension   . Overweight     Patient Active Problem List   Diagnosis Date Noted  . Ischemic cardiomyopathy 06/01/2020  . Hypertension 06/01/2020  . Hyperlipidemia with target LDL less than 70 06/01/2020  . DM type 2, goal HbA1c < 7% (HCC) 06/01/2020  . OSA (obstructive sleep apnea) 06/01/2020  . Morbid obesity (HCC) 06/01/2020  . ST elevation myocardial infarction involving left anterior descending (LAD) coronary artery (HCC)   . Angina pectoris (HCC) 11/24/2018    Past Surgical History:  Procedure Laterality Date  . CORONARY STENT INTERVENTION N/A 05/30/2020   Procedure: CORONARY STENT INTERVENTION;  Surgeon: Lennette Bihari, MD;  Location: [facility] INVASIVE CV LAB;  Service: Cardiovascular;  Laterality: N/A;  . CORONARY/GRAFT ACUTE MI REVASCULARIZATION N/A 05/30/2020   Procedure: Coronary/Graft Acute MI Revascularization;  Surgeon: Lennette Bihari, MD;  Location: [facility] INVASIVE CV LAB;  Service: Cardiovascular;  Laterality: N/A;  . LEFT HEART CATH AND CORONARY ANGIOGRAPHY N/A  05/30/2020   Procedure: LEFT HEART CATH AND CORONARY ANGIOGRAPHY;  Surgeon: Lennette Bihari, MD;  Location: [facility] INVASIVE CV LAB;  Service: Cardiovascular;  Laterality: N/A;  . torn rotator cuff  2018   2018       Family History  Problem Relation Age of Onset  . Hypertension Mother   . Diabetes Mother   . Hypertension Father   . Diabetes Father   . Diabetes Maternal Grandmother   . Hypertension Maternal Grandmother   . Diabetes Maternal Grandfather   . Hypertension Maternal Grandfather   . Diabetes Paternal Grandmother   . Hypertension Paternal Grandmother   . Diabetes Paternal Grandfather   . Hypertension Paternal Grandfather   . Asthma Neg Hx     Social History   Tobacco Use  . Smoking status: Never Smoker  . Smokeless tobacco: Former Neurosurgeon    Types: Engineer, drilling  . Vaping Use: Never assessed  Substance Use Topics  . Alcohol use: No  . Drug use: No    Home Medications Prior to Admission medications   Medication Sig Start Date End Date Taking? Authorizing Provider  albuterol (VENTOLIN HFA) 108 (90 Base) MCG/ACT inhaler Inhale 2 puffs into the lungs every 6 (six) hours as needed for wheezing or shortness of breath.   Yes [provider]  aspirin 81 MG chewable tablet Chew 1 tablet (81 mg total) by mouth daily. 06/02/20  Yes Kroeger, Dot Lanes M., PA-C  atorvastatin (LIPITOR) 80 MG tablet Take 1 tablet (80 mg total) by mouth daily at 6 PM. 06/01/20  Yes Kroeger, Dot Lanes M., PA-C  carvedilol (COREG) 6.25 MG tablet Take 1 tablet (6.25 mg total) by mouth 2 (two) times daily with a meal. 06/01/20  Yes Kroeger, Dot Lanes M., PA-C  cetirizine (ZYRTEC) 10 MG tablet Take 1 tablet (10 mg total) by mouth daily. 02/17/20  Yes Charlott Holler, MD  dapagliflozin propanediol (FARXIGA) 5 MG TABS tablet Take 1 tablet (5 mg total) by mouth daily. 06/01/20  Yes Kroeger, Dot Lanes M., PA-C  fluticasone (FLONASE) 50 MCG/ACT nasal spray SHAKE LIQUID AND USE 1 SPRAY IN EACH NOSTRIL DAILY Patient  taking differently: Place 2 sprays into both nostrils daily as needed for allergies or rhinitis.  05/25/20  Yes Charlott Holler, MD  LANTUS SOLOSTAR 100 UNIT/ML Solostar Pen Inject 55 Units into the skin daily after breakfast. 06/01/20  Yes Kroeger, Dot Lanes M., PA-C  magnesium citrate SOLN Take 1 Bottle by mouth once as needed for mild constipation or moderate constipation.    Yes [provider]  pantoprazole (PROTONIX) 40 MG tablet Take 40 mg by mouth 2 (two) times daily.   Yes [provider]  sacubitril-valsartan (ENTRESTO) 24-26 MG Take 1 tablet by mouth 2 (two) times daily. 06/01/20  Yes Kroeger, Ovidio Kin., PA-C  ticagrelor (BRILINTA) 90 MG TABS tablet Take 1 tablet (90 mg total) by mouth 2 (two) times daily. 06/01/20  Yes Kroeger, Ovidio Kin., PA-C  acetaminophen (TYLENOL) 650 MG CR tablet Take 1,300 mg by mouth every 8 (eight) hours as needed for pain.    [provider]  nitroGLYCERIN (NITROSTAT) 0.4 MG SL tablet Place 1 tablet (0.4 mg total) under the tongue every 5 (five) minutes as needed for chest pain. 06/01/20   Kroeger, Ovidio Kin., PA-C    Allergies    Pineapple, Tomato, and Rosuvastatin calcium  Review of Systems   Review of Systems  Constitutional:       Per HPI, otherwise negative  HENT:       Per HPI, otherwise negative  Respiratory:       Per HPI, otherwise negative  Cardiovascular:       Per HPI, otherwise negative  Gastrointestinal: Positive for nausea. Negative for vomiting.  Endocrine:       Negative aside from HPI  Genitourinary:       Neg aside from HPI   Musculoskeletal:       Per HPI, otherwise negative  Skin: Negative.   Neurological: Negative for syncope.    Physical Exam Updated Vital Signs BP 105/86   Pulse 96   Temp 99.3 F (37.4 C) (Oral)   Resp (!) 23   Ht 6\' 2"  (1.88 m)   Wt 135.2 kg   SpO2 97%   BMI 38.26 kg/m   Physical Exam Vitals and nursing note reviewed.  Constitutional:      General: He is not in acute  distress.    Appearance: He is well-developed.  HENT:     Head: Normocephalic and atraumatic.  Eyes:     Conjunctiva/sclera: Conjunctivae normal.  Cardiovascular:     Rate and Rhythm: Regular rhythm. Tachycardia present.     Pulses: Normal pulses.  Pulmonary:     Effort: Pulmonary effort is normal. No respiratory distress.     Breath sounds: No stridor.  Abdominal:     General: There is no distension.  Skin:    General: Skin is warm and dry.  Neurological:     Mental Status: He is alert and oriented to person, place, and time.  ED Results / Procedures / Treatments   Labs (all labs ordered are listed, but only abnormal results are displayed) Labs Reviewed  BASIC METABOLIC PANEL - Abnormal; Notable for the following components:      Result Value   Sodium 134 (*)    CO2 21 (*)    Glucose, Bld 174 (*)    Calcium 8.7 (*)    All other components within normal limits  CBC - Abnormal; Notable for the following components:   WBC 11.3 (*)    RBC 3.99 (*)    Hemoglobin 12.0 (*)    HCT 37.7 (*)    Platelets 576 (*)    All other components within normal limits  TROPONIN I (HIGH SENSITIVITY) - Abnormal; Notable for the following components:   Troponin I (High Sensitivity) 252 (*)    All other components within normal limits  SARS CORONAVIRUS 2 BY RT PCR [facility] ORDER, PERFORMED IN [facility] LAB)  TROPONIN I (HIGH SENSITIVITY)    EKG EKG Interpretation  Date/Time:  Tuesday June 08 2020 12:10:07 EDT Ventricular Rate:  158 PR Interval:  146 QRS Duration: 124 QT Interval:  324 QTC Calculation: 525 R Axis:   -63 Text Interpretation: aflutter Abnormal ECG Confirmed by Gerhard Munch [phone]) on 06/08/2020 12:52:08 PM   Radiology DG Chest Portable 1 View  Result Date: 06/08/2020 CLINICAL DATA:  Tachycardia EXAM: PORTABLE CHEST 1 VIEW COMPARISON:  05/30/2020 FINDINGS: The heart size and mediastinal contours are within normal limits. Chronic elevation of the  right hemidiaphragm with right basilar atelectasis. Left lung is clear. No pleural effusion or pneumothorax. The visualized skeletal structures are unremarkable. IMPRESSION: Chronic elevation of the right hemidiaphragm with right basilar atelectasis. No acute cardiopulmonary process. Electronically Signed   By: Duanne Guess D.O.   On: 06/08/2020 12:40    Procedures Procedures (including critical care time)  CRITICAL CARE Performed by: Gerhard Munch Total critical care time: 35 minutes Critical care time was exclusive of separately billable procedures and treating other patients. Critical care was necessary to treat or prevent imminent or life-threatening deterioration. Critical care was time spent personally by me on the following activities: development of treatment plan with patient and/or surrogate as well as nursing, discussions with consultants, evaluation of patient's response to treatment, examination of patient, obtaining history from patient or surrogate, ordering and performing treatments and interventions, ordering and review of laboratory studies, ordering and review of radiographic studies, pulse oximetry and re-evaluation of patient's condition.   Medications Ordered in ED Medications  diltiazem (CARDIZEM) 125 mg in dextrose 5% 125 mL (1 mg/mL) infusion (5 mg/hr Intravenous New Bag/Given 06/08/20 1316)  ondansetron (ZOFRAN) injection 4 mg (4 mg Intravenous Given 06/08/20 1303)    ED Course  I have reviewed the triage vital signs and the nursing notes.  Pertinent labs & imaging results that were available during my care of the patient were reviewed by me and considered in my medical decision making (see chart for details).    MDM Rules/Calculators/A&P                          CHA2DS2-VASc Score = 3  The patient's score is based upon: CHF History: 0 HTN History: 1 Age : 0 Diabetes History: 1 Stroke History: 0 Vascular Disease History: 1 Gender: 0       ASSESSMENT AND PLAN: Paroxysmal Atrial Fibrillation (ICD10:  I48.0) The patient's CHA2DS2-VASc score is 3, indicating a 3.2% annual risk of  stroke.    1:35 PM Heart rate now 130, A. fib after initiation of Cardizem drip. Blood pressure remains essentially unchanged, MAP 80.  2:43 PM Labs largely unremarkable aside from elevated troponin.  However, in context, with troponin of several thousands 2 weeks ago during MI, today's troponin is likely appropriately declining. mild leukocytosis, mild hyponatremia.  4:29 PM Heart rate remains elevated, though substantial improved from arrival. He has been seen and evaluated by our cardiology colleagues. Adult male with recent STEMI requiring coronary intervention now presents with new dysrhythmia. Patient is awake and alert, but has mild hypotension with substantial tachycardia on arrival. With consideration of post procedure/STEMI dysrhythmia, patient had broad differential, including infection, electrolyte abnormalities, medication effects considered.  Patient started on Cardizem drip soon after arrival with appropriate reduction in his heart rate. Given the temporal proximity to his intervention I discussed his case with our cardiology colleagues who will admit the patient for further monitoring, management, therapy.  ED Diagnosis: paroxysmal Atrial fibrillation   Gerhard Munch, MD 06/08/20 [phone]

## 2020-06-08 NOTE — ED Notes (Signed)
Heart healthy meal requested by service respond.

## 2020-06-09 ENCOUNTER — Encounter (HOSPITAL_COMMUNITY): Payer: Self-pay | Admitting: Cardiovascular Disease

## 2020-06-09 DIAGNOSIS — I2102 ST elevation (STEMI) myocardial infarction involving left anterior descending coronary artery: Secondary | ICD-10-CM | POA: Diagnosis present

## 2020-06-09 DIAGNOSIS — I480 Paroxysmal atrial fibrillation: Secondary | ICD-10-CM | POA: Diagnosis present

## 2020-06-09 DIAGNOSIS — Z794 Long term (current) use of insulin: Secondary | ICD-10-CM | POA: Diagnosis not present

## 2020-06-09 DIAGNOSIS — Z955 Presence of coronary angioplasty implant and graft: Secondary | ICD-10-CM | POA: Diagnosis not present

## 2020-06-09 DIAGNOSIS — Z7982 Long term (current) use of aspirin: Secondary | ICD-10-CM | POA: Diagnosis not present

## 2020-06-09 DIAGNOSIS — E7800 Pure hypercholesterolemia, unspecified: Secondary | ICD-10-CM | POA: Diagnosis present

## 2020-06-09 DIAGNOSIS — I5022 Chronic systolic (congestive) heart failure: Secondary | ICD-10-CM | POA: Diagnosis present

## 2020-06-09 DIAGNOSIS — Z6838 Body mass index (BMI) 38.0-38.9, adult: Secondary | ICD-10-CM | POA: Diagnosis not present

## 2020-06-09 DIAGNOSIS — D649 Anemia, unspecified: Secondary | ICD-10-CM | POA: Diagnosis present

## 2020-06-09 DIAGNOSIS — I4892 Unspecified atrial flutter: Secondary | ICD-10-CM

## 2020-06-09 DIAGNOSIS — I110 Hypertensive heart disease with heart failure: Secondary | ICD-10-CM | POA: Diagnosis present

## 2020-06-09 DIAGNOSIS — I2510 Atherosclerotic heart disease of native coronary artery without angina pectoris: Secondary | ICD-10-CM | POA: Diagnosis present

## 2020-06-09 DIAGNOSIS — G4733 Obstructive sleep apnea (adult) (pediatric): Secondary | ICD-10-CM | POA: Diagnosis present

## 2020-06-09 DIAGNOSIS — Z8249 Family history of ischemic heart disease and other diseases of the circulatory system: Secondary | ICD-10-CM | POA: Diagnosis not present

## 2020-06-09 DIAGNOSIS — R05 Cough: Secondary | ICD-10-CM | POA: Diagnosis not present

## 2020-06-09 DIAGNOSIS — Z20822 Contact with and (suspected) exposure to covid-19: Secondary | ICD-10-CM | POA: Diagnosis present

## 2020-06-09 DIAGNOSIS — E119 Type 2 diabetes mellitus without complications: Secondary | ICD-10-CM | POA: Diagnosis present

## 2020-06-09 DIAGNOSIS — Z833 Family history of diabetes mellitus: Secondary | ICD-10-CM | POA: Diagnosis not present

## 2020-06-09 DIAGNOSIS — I255 Ischemic cardiomyopathy: Secondary | ICD-10-CM | POA: Diagnosis present

## 2020-06-09 DIAGNOSIS — E785 Hyperlipidemia, unspecified: Secondary | ICD-10-CM | POA: Diagnosis present

## 2020-06-09 DIAGNOSIS — I10 Essential (primary) hypertension: Secondary | ICD-10-CM | POA: Diagnosis not present

## 2020-06-09 DIAGNOSIS — I959 Hypotension, unspecified: Secondary | ICD-10-CM | POA: Diagnosis present

## 2020-06-09 DIAGNOSIS — Z79899 Other long term (current) drug therapy: Secondary | ICD-10-CM | POA: Diagnosis not present

## 2020-06-09 DIAGNOSIS — Z7902 Long term (current) use of antithrombotics/antiplatelets: Secondary | ICD-10-CM | POA: Diagnosis not present

## 2020-06-09 DIAGNOSIS — E871 Hypo-osmolality and hyponatremia: Secondary | ICD-10-CM | POA: Diagnosis present

## 2020-06-09 HISTORY — DX: Unspecified atrial flutter: I48.92

## 2020-06-09 LAB — GLUCOSE, CAPILLARY
Glucose-Capillary: 233 mg/dL — ABNORMAL HIGH (ref 70–99)
Glucose-Capillary: 200 mg/dL — ABNORMAL HIGH (ref 70–99)

## 2020-06-09 LAB — BASIC METABOLIC PANEL
Anion gap: 10 (ref 5–15)
BUN: 17 mg/dL (ref 6–20)
CO2: 22 mmol/L (ref 22–32)
Calcium: 8.7 mg/dL — ABNORMAL LOW (ref 8.9–10.3)
Chloride: 102 mmol/L (ref 98–111)
Creatinine, Ser: 1.16 mg/dL (ref 0.61–1.24)
GFR calc Af Amer: >60 mL/min (ref >60)
GFR calc non Af Amer: >60 mL/min (ref >60)
Glucose, Bld: 144 mg/dL — ABNORMAL HIGH (ref 70–99)
Potassium: 4.2 mmol/L (ref 3.5–5.1)
Sodium: 134 mmol/L — ABNORMAL LOW (ref 135–145)

## 2020-06-09 LAB — TSH: TSH: 1.172 u[IU]/mL (ref 0.350–4.500)

## 2020-06-09 MED ORDER — ZOLPIDEM TARTRATE 5 MG PO TABS
5.0000 mg | ORAL_TABLET | Freq: Every evening | ORAL | Status: DC | PRN
  Administered 2020-06-09 – 2020-06-10 (×3): 5 mg via ORAL
  Filled 2020-06-09 (×3): qty 1

## 2020-06-09 MED ORDER — INSULIN GLARGINE 100 UNIT/ML ~~LOC~~ SOLN
27.0000 [IU] | Freq: Every day | SUBCUTANEOUS | Status: AC
  Administered 2020-06-09 – 2020-06-10 (×2): 27 [IU] via SUBCUTANEOUS
  Filled 2020-06-09 (×2): qty 0.27

## 2020-06-09 MED ORDER — SODIUM CHLORIDE 0.9 % IV SOLN: INTRAVENOUS | Status: DC

## 2020-06-09 MED ORDER — INSULIN ASPART 100 UNIT/ML ~~LOC~~ SOLN
0.0000 [IU] | Freq: Three times a day (TID) | SUBCUTANEOUS | Status: DC
  Administered 2020-06-09: 3 [IU] via SUBCUTANEOUS
  Administered 2020-06-10 (×2): 1 [IU] via SUBCUTANEOUS
  Administered 2020-06-10: 2 [IU] via SUBCUTANEOUS
  Administered 2020-06-11: 3 [IU] via SUBCUTANEOUS
  Administered 2020-06-11: 1 [IU] via SUBCUTANEOUS

## 2020-06-09 MED ORDER — INSULIN GLARGINE 100 UNIT/ML ~~LOC~~ SOLN
55.0000 [IU] | Freq: Every day | SUBCUTANEOUS | Status: DC
  Administered 2020-06-11: 55 [IU] via SUBCUTANEOUS
  Filled 2020-06-09: qty 0.55

## 2020-06-09 NOTE — ED Notes (Signed)
Pt placed on hospital bed for comfort.    States he has head congestion and wants something to help him sleep.  Will page the provider.

## 2020-06-09 NOTE — Progress Notes (Addendum)
Progress Note  Patient Name: [name] Merritt Date of Encounter: 06/09/2020  Primary Cardiologist: Nicki Guadalajara, MD  Subjective   Breathing/cough improved. No CP. HR improved.  Inpatient Medications    Scheduled Meds: . apixaban  5 mg Oral BID  . aspirin  81 mg Oral Daily  . atorvastatin  80 mg Oral q1800  . clopidogrel  75 mg Oral Daily  . dapagliflozin propanediol  5 mg Oral Daily  . insulin glargine  55 Units Subcutaneous Daily  . metoprolol succinate  25 mg Oral Daily  . pantoprazole  40 mg Oral BID   Continuous Infusions: . diltiazem (CARDIZEM) infusion 10 mg/hr (06/09/20 0639)   PRN Meds: acetaminophen, ondansetron (ZOFRAN) IV, zolpidem   Vital Signs    Vitals:   06/09/20 0642 06/09/20 0700 06/09/20 0730 06/09/20 1030  BP: 115/79 119/77 118/78 115/77  Pulse: 74 76 76 74  Resp: (!) 25 (!) 21 (!) 23 (!) 28  Temp:      TempSrc:      SpO2: 98% 96% 96% 98%  Weight:      Height:        Intake/Output Summary (Last 24 hours) at 06/09/2020 1110 Last data filed at 06/09/2020 1045 Gross per 24 hour  Intake 88.98 ml  Output 2000 ml  Net -1911.02 ml   Last 3 Weights 06/08/2020 06/08/2020 06/01/2020  Weight (lbs) 298 lb 298 lb 9.6 oz 306 lb 1.6 oz  Weight (kg) 135.172 kg 135.444 kg 138.846 kg     Telemetry    Atrial flutter HR 70s - Personally Reviewed  Physical Exam   GEN: No acute distress. Lying flat HEENT: Normocephalic, atraumatic, sclera non-icteric. Neck: No JVD or bruits. Cardiac: RRR (3:1 conduction), no murmurs, rubs, or gallops.  Radials/DP/PT 1+ and equal bilaterally.  Respiratory: Clear to auscultation bilaterally. Breathing is unlabored. GI: Soft, nontender, non-distended, BS +x 4. MS: no deformity. Extremities: No clubbing or cyanosis. No edema. Distal pedal pulses are 2+ and equal bilaterally. Neuro:  AAOx3. Follows commands. Psych:  Responds to questions appropriately with a normal affect.  Labs    High Sensitivity Troponin:   Recent  Labs  Lab 05/30/20 0952 06/08/20 1222 06/08/20 1530  TROPONINIHS 7,578* 252* 241*      Cardiac EnzymesNo results for input(s): TROPONINI in the last 168 hours. No results for input(s): TROPIPOC in the last 168 hours.   Chemistry Recent Labs  Lab 06/08/20 1222 06/09/20 0324  NA 134* 134*  K 4.5 4.2  CL 102 102  CO2 21* 22  GLUCOSE 174* 144*  BUN 16 17  CREATININE 1.17 1.16  CALCIUM 8.7* 8.7*  GFRNONAA >60 >60  GFRAA >60 >60  ANIONGAP 11 10     Hematology Recent Labs  Lab 06/08/20 1222  WBC 11.3*  RBC 3.99*  HGB 12.0*  HCT 37.7*  MCV 94.5  MCH 30.1  MCHC 31.8  RDW 12.1  PLT 576*    BNPNo results for input(s): BNP, PROBNP in the last 168 hours.   DDimer No results for input(s): DDIMER in the last 168 hours.   Radiology    DG Chest Portable 1 View  Result Date: 06/08/2020 CLINICAL DATA:  Tachycardia EXAM: PORTABLE CHEST 1 VIEW COMPARISON:  05/30/2020 FINDINGS: The heart size and mediastinal contours are within normal limits. Chronic elevation of the right hemidiaphragm with right basilar atelectasis. Left lung is clear. No pleural effusion or pneumothorax. The visualized skeletal structures are unremarkable. IMPRESSION: Chronic elevation of the right hemidiaphragm with right  basilar atelectasis. No acute cardiopulmonary process. Electronically Signed   By: Duanne Guess D.O.   On: 06/08/2020 12:40    Cardiac Studies   2D Echo 05/31/20 1. Akinesis of the mid septum into the apex consistent with LAD  infarction. Contrast imaging shows no evidence of LV thrombus. Left  ventricular ejection fraction, by estimation, is 35 to 40%. The left  ventricle has moderately decreased function. The  left ventricle demonstrates regional wall motion abnormalities (see  scoring diagram/findings for description). Left ventricular diastolic  parameters are consistent with Grade II diastolic dysfunction  (pseudonormalization). Elevated left atrial pressure.  2. Right  ventricular systolic function is normal. The right ventricular  size is normal. There is normal pulmonary artery systolic pressure. The  estimated right ventricular systolic pressure is 19.3 mmHg.  3. The mitral valve is grossly normal. Trivial mitral valve  regurgitation. No evidence of mitral stenosis.  4. The aortic valve is tricuspid. Aortic valve regurgitation is not  visualized. Mild aortic valve sclerosis is present, with no evidence of  aortic valve stenosis.  5. The inferior vena cava is normal in size with greater than 50%  respiratory variability, suggesting right atrial pressure of 3 mmHg.   Conclusion(s)/Recommendation(s): Findings consistent with ischemic  cardiomyopathy.   LHC 05/30/20  3rd Mrg lesion is 50% stenosed.  LPDA lesion is 70% stenosed.  LPAV-1 lesion is 30% stenosed.  LPAV-2 lesion is 30% stenosed.  1st Diag lesion is 70% stenosed.  Ramus lesion is 20% stenosed.  Mid LAD lesion is 100% stenosed.  Post intervention, there is a 0% residual stenosis.  A stent was successfully placed.   Acute anterolateral ST segment elevation myocardial infarction secondary to total occlusion of the mid LAD with TIMI 0 flow and absence of collaterals.  Concomitant CAD with tortuous 1st diagonal vessel with 70% narrowing beyond a sharp bend in the vessel, 20% narrowing beyond a sharp bend in the ramus immediate vessel, and dominant left circumflex coronary artery with 50% focal OM 3 stenosis, distal 30% stenoses in the AV groove, and focal 70% stenosis in the midportion of a small PLA branch.  Normal small nondominant RCA.  LVEDP 12 millimeters Hg.   Successful PCI to the totally occluded mid LAD with ultimate insertion of a Resolute Onyx 3.0 x 22 mm DES stent postdilated to 3.25 mm with digital narrowing of 0% and restoration of TIMI-3 flow.  RECOMMENDATION: DAPT for minimum of 1 year.  Aggressive lipid-lowering therapy with titration of atorvastatin to 80 mg  daily.  We will reinitiate ARB therapy with losartan initially at lower dose, add low-dose beta-blocker therapy, and obtain 2D echo Doppler study in a.m. optimal blood pressure control with target blood pressure less than 130/80 and ideal blood pressure less than 120/80.  We will diabetes control.  If patient has LV dysfunction, SGLT 2 inhibition can be considered.  Patient Profile     49 y.o. male with recent anterior STEMI found to have an occluded LAD which was successfully stented with DES/PCI placement (with residual 50% stenosis in OM and 70% stenosis in left PDA), DM2, HLD, HTN, ICM EF 35-40%, chronic cough (was pending outpatient cardiology OV). He reported persisted low grade fevers, DOE and fatigue ever since discharge. Blood cultures and UA were negative during last admission. He presented to [facility] infusion clinic on 06/08/20 and was round to be in rapid atrial flutter. Covid test negative.   Assessment & Plan    1. New onset atrial flutter with RVR,  unclear onset (sometime between discharge 8/10 and infusion visit 8/17) - currently rate controlled on IV diltiazem - no plans for this long term given LV dysfunction, being used as a bridge to cardioversion - carvedilol transitioned to metoprolol for HR control with less BP effect and better beta-1 selectivity - now on Eliquis (CHADSVASC 4) - plan TEE/DCCV tomorrow 11am with Dr. Bjorn Pippin  2. CAD with recent acute anterior STEMI s/p DES to occluded LAD - no recurrent anginal symptoms, troponin downtrending - on PTA DAPT with ASA and Brilinta -> transitioned off Brilinta and onto Plavix given need for [facility] with atrial flutter  3. Ischemic cardiomyopathy: - Echocardiogram performed last admission with LVEF at 35-40% - Holding Entresto for now to allow for greater BP if needed  - CXR OK  4. Low grade fevers and cough - blood cx and UA negative last admission - Covid negative - feeling better this AM - per d/w Dr. Excell Seltzer, continue to  monitor -> anticipate OP f/u pulm  5. Uncontrolled DM2: A1c 9.8 on last admission - will 1/2 dose Lantus today and tomorrow since he was NPO today for most of the morning, and will be NPO tomorrow AM - resume 55 units on Friday - continue Farxiga - Needs close follow up with Dr Nehemiah Settle - Add SSI/CBG checks  6.Hyperlipidemia: - LDL, 182 on last admission with goal LDL <70 - Continue high dose lipitor 80 mg daily and recheck lipid panel at next OV   7. HTN: - follow in context of above  8. Morbid obesitywith OSA - recent dx of sleep apnea by [facility] and is awaiting starting CPAP  For questions or updates, please contact [facility] Please consult [email or web address] for contact info under Cardiology/STEMI.  Signed, Laurann Montana, PA-C 06/09/2020, 11:10 AM    Patient seen, examined. Available data reviewed. Agree with findings, assessment, and plan as outlined by Ronie Spies, PA-C.  The patient is independently interviewed and examined.  He is feeling a lot better today after discontinuing ticagrelor he thinks his breathing is much better.  On my exam, he is alert, oriented, in no distress.  JVP is normal, lungs are clear, heart is regular rate and rhythm with no murmur gallop, abdomen is soft, obese, nontender.  Extremities have no edema.  Telemetry shows atrial flutter with a ventricular rate of 75 bpm, remaining on IV diltiazem.  The patient is scheduled for transesophageal echocardiogram and cardioversion tomorrow.  I have again reviewed the risks, indications, and alternatives to both procedures.  He has been started on apixaban for anticoagulation.  He will be on triple therapy with aspirin, clopidogrel, and apixaban for a short period of time.  Will ultimately stop his aspirin probably in 30 days.  Anticipate stopping diltiazem post cardioversion in the setting of this patient's LV dysfunction.  Prefer not to use amiodarone in this young gentleman.  Hopefully he will maintain  sinus rhythm on beta-blockade alone.  It will be important for him to make significant lifestyle changes with weight loss and treatment of his obstructive sleep apnea.    Tonny Bollman, M.D. 06/09/2020 1:50 PM

## 2020-06-09 NOTE — H&P (View-Only) (Signed)
 Progress Note  Patient Name: [name] Merritt Date of Encounter: 06/09/2020  Primary Cardiologist: Thomas Kelly, MD  Subjective   Breathing/cough improved. No CP. HR improved.  Inpatient Medications    Scheduled Meds: . apixaban  5 mg Oral BID  . aspirin  81 mg Oral Daily  . atorvastatin  80 mg Oral q1800  . clopidogrel  75 mg Oral Daily  . dapagliflozin propanediol  5 mg Oral Daily  . insulin glargine  55 Units Subcutaneous Daily  . metoprolol succinate  25 mg Oral Daily  . pantoprazole  40 mg Oral BID   Continuous Infusions: . diltiazem (CARDIZEM) infusion 10 mg/hr (06/09/20 0639)   PRN Meds: acetaminophen, ondansetron (ZOFRAN) IV, zolpidem   Vital Signs    Vitals:   06/09/20 0642 06/09/20 0700 06/09/20 0730 06/09/20 1030  BP: 115/79 119/77 118/78 115/77  Pulse: 74 76 76 74  Resp: (!) 25 (!) 21 (!) 23 (!) 28  Temp:      TempSrc:      SpO2: 98% 96% 96% 98%  Weight:      Height:        Intake/Output Summary (Last 24 hours) at 06/09/2020 1110 Last data filed at 06/09/2020 1045 Gross per 24 hour  Intake 88.98 ml  Output 2000 ml  Net -1911.02 ml   Last 3 Weights 06/08/2020 06/08/2020 06/01/2020  Weight (lbs) 298 lb 298 lb 9.6 oz 306 lb 1.6 oz  Weight (kg) 135.172 kg 135.444 kg 138.846 kg     Telemetry    Atrial flutter HR 70s - Personally Reviewed  Physical Exam   GEN: No acute distress. Lying flat HEENT: Normocephalic, atraumatic, sclera non-icteric. Neck: No JVD or bruits. Cardiac: RRR (3:1 conduction), no murmurs, rubs, or gallops.  Radials/DP/PT 1+ and equal bilaterally.  Respiratory: Clear to auscultation bilaterally. Breathing is unlabored. GI: Soft, nontender, non-distended, BS +x 4. MS: no deformity. Extremities: No clubbing or cyanosis. No edema. Distal pedal pulses are 2+ and equal bilaterally. Neuro:  AAOx3. Follows commands. Psych:  Responds to questions appropriately with a normal affect.  Labs    High Sensitivity Troponin:   Recent  Labs  Lab 05/30/20 0952 06/08/20 1222 06/08/20 1530  TROPONINIHS 7,578* 252* 241*      Cardiac EnzymesNo results for input(s): TROPONINI in the last 168 hours. No results for input(s): TROPIPOC in the last 168 hours.   Chemistry Recent Labs  Lab 06/08/20 1222 06/09/20 0324  NA 134* 134*  K 4.5 4.2  CL 102 102  CO2 21* 22  GLUCOSE 174* 144*  BUN 16 17  CREATININE 1.17 1.16  CALCIUM 8.7* 8.7*  GFRNONAA >60 >60  GFRAA >60 >60  ANIONGAP 11 10     Hematology Recent Labs  Lab 06/08/20 1222  WBC 11.3*  RBC 3.99*  HGB 12.0*  HCT 37.7*  MCV 94.5  MCH 30.1  MCHC 31.8  RDW 12.1  PLT 576*    BNPNo results for input(s): BNP, PROBNP in the last 168 hours.   DDimer No results for input(s): DDIMER in the last 168 hours.   Radiology    DG Chest Portable 1 View  Result Date: 06/08/2020 CLINICAL DATA:  Tachycardia EXAM: PORTABLE CHEST 1 VIEW COMPARISON:  05/30/2020 FINDINGS: The heart size and mediastinal contours are within normal limits. Chronic elevation of the right hemidiaphragm with right basilar atelectasis. Left lung is clear. No pleural effusion or pneumothorax. The visualized skeletal structures are unremarkable. IMPRESSION: Chronic elevation of the right hemidiaphragm with right   basilar atelectasis. No acute cardiopulmonary process. Electronically Signed   By: Nicholas  Plundo D.O.   On: 06/08/2020 12:40    Cardiac Studies   2D Echo 05/31/20 1. Akinesis of the mid septum into the apex consistent with LAD  infarction. Contrast imaging shows no evidence of LV thrombus. Left  ventricular ejection fraction, by estimation, is 35 to 40%. The left  ventricle has moderately decreased function. The  left ventricle demonstrates regional wall motion abnormalities (see  scoring diagram/findings for description). Left ventricular diastolic  parameters are consistent with Grade II diastolic dysfunction  (pseudonormalization). Elevated left atrial pressure.  2. Right  ventricular systolic function is normal. The right ventricular  size is normal. There is normal pulmonary artery systolic pressure. The  estimated right ventricular systolic pressure is 19.3 mmHg.  3. The mitral valve is grossly normal. Trivial mitral valve  regurgitation. No evidence of mitral stenosis.  4. The aortic valve is tricuspid. Aortic valve regurgitation is not  visualized. Mild aortic valve sclerosis is present, with no evidence of  aortic valve stenosis.  5. The inferior vena cava is normal in size with greater than 50%  respiratory variability, suggesting right atrial pressure of 3 mmHg.   Conclusion(s)/Recommendation(s): Findings consistent with ischemic  cardiomyopathy.   LHC 05/30/20  3rd Mrg lesion is 50% stenosed.  LPDA lesion is 70% stenosed.  LPAV-1 lesion is 30% stenosed.  LPAV-2 lesion is 30% stenosed.  1st Diag lesion is 70% stenosed.  Ramus lesion is 20% stenosed.  Mid LAD lesion is 100% stenosed.  Post intervention, there is a 0% residual stenosis.  A stent was successfully placed.   Acute anterolateral ST segment elevation myocardial infarction secondary to total occlusion of the mid LAD with TIMI 0 flow and absence of collaterals.  Concomitant CAD with tortuous 1st diagonal vessel with 70% narrowing beyond a sharp bend in the vessel, 20% narrowing beyond a sharp bend in the ramus immediate vessel, and dominant left circumflex coronary artery with 50% focal OM 3 stenosis, distal 30% stenoses in the AV groove, and focal 70% stenosis in the midportion of a small PLA branch.  Normal small nondominant RCA.  LVEDP 12 millimeters Hg.   Successful PCI to the totally occluded mid LAD with ultimate insertion of a Resolute Onyx 3.0 x 22 mm DES stent postdilated to 3.25 mm with digital narrowing of 0% and restoration of TIMI-3 flow.  RECOMMENDATION: DAPT for minimum of 1 year.  Aggressive lipid-lowering therapy with titration of atorvastatin to 80 mg  daily.  We will reinitiate ARB therapy with losartan initially at lower dose, add low-dose beta-blocker therapy, and obtain 2D echo Doppler study in a.m. optimal blood pressure control with target blood pressure less than 130/80 and ideal blood pressure less than 120/80.  We will diabetes control.  If patient has LV dysfunction, SGLT 2 inhibition can be considered.  Patient Profile     49 y.o. male with recent anterior STEMI found to have an occluded LAD which was successfully stented with DES/PCI placement (with residual 50% stenosis in OM and 70% stenosis in left PDA), DM2, HLD, HTN, ICM EF 35-40%, chronic cough (was pending outpatient cardiology OV). He reported persisted low grade fevers, DOE and fatigue ever since discharge. Blood cultures and UA were negative during last admission. He presented to [facility] infusion clinic on 06/08/20 and was round to be in rapid atrial flutter. Covid test negative.   Assessment & Plan    1. New onset atrial flutter with RVR,   unclear onset (sometime between discharge 8/10 and infusion visit 8/17) - currently rate controlled on IV diltiazem - no plans for this long term given LV dysfunction, being used as a bridge to cardioversion - carvedilol transitioned to metoprolol for HR control with less BP effect and better beta-1 selectivity - now on Eliquis (CHADSVASC 4) - plan TEE/DCCV tomorrow 11am with Dr. Schumann  2. CAD with recent acute anterior STEMI s/p DES to occluded LAD - no recurrent anginal symptoms, troponin downtrending - on PTA DAPT with ASA and Brilinta -> transitioned off Brilinta and onto Plavix given need for OAC with atrial flutter  3. Ischemic cardiomyopathy: - Echocardiogram performed last admission with LVEF at 35-40% - Holding Entresto for now to allow for greater BP if needed  - CXR OK  4. Low grade fevers and cough - blood cx and UA negative last admission - Covid negative - feeling better this AM - per d/w Dr. Taneah Masri, continue to  monitor -> anticipate OP f/u pulm  5. Uncontrolled DM2: A1c 9.8 on last admission - will 1/2 dose Lantus today and tomorrow since he was NPO today for most of the morning, and will be NPO tomorrow AM - resume 55 units on Friday - continue Farxiga - Needs close follow up with Dr Polite - Add SSI/CBG checks  6.Hyperlipidemia: - LDL, 182 on last admission with goal LDL <70 - Continue high dose lipitor 80 mg daily and recheck lipid panel at next OV   7. HTN: - follow in context of above  8. Morbid obesitywith OSA - recent dx of sleep apnea by [facility] and is awaiting starting CPAP  For questions or updates, please contact [facility] Please consult [email or web address] for contact info under Cardiology/STEMI.  Signed, Dayna N Dunn, PA-C 06/09/2020, 11:10 AM    Patient seen, examined. Available data reviewed. Agree with findings, assessment, and plan as outlined by Dayna Dunn, PA-C.  The patient is independently interviewed and examined.  He is feeling a lot better today after discontinuing ticagrelor he thinks his breathing is much better.  On my exam, he is alert, oriented, in no distress.  JVP is normal, lungs are clear, heart is regular rate and rhythm with no murmur gallop, abdomen is soft, obese, nontender.  Extremities have no edema.  Telemetry shows atrial flutter with a ventricular rate of 75 bpm, remaining on IV diltiazem.  The patient is scheduled for transesophageal echocardiogram and cardioversion tomorrow.  I have again reviewed the risks, indications, and alternatives to both procedures.  He has been started on apixaban for anticoagulation.  He will be on triple therapy with aspirin, clopidogrel, and apixaban for a short period of time.  Will ultimately stop his aspirin probably in 30 days.  Anticipate stopping diltiazem post cardioversion in the setting of this patient's LV dysfunction.  Prefer not to use amiodarone in this young gentleman.  Hopefully he will maintain  sinus rhythm on beta-blockade alone.  It will be important for him to make significant lifestyle changes with weight loss and treatment of his obstructive sleep apnea.    Ottavio Norem, M.D. 06/09/2020 1:50 PM    

## 2020-06-09 NOTE — Discharge Instructions (Signed)

## 2020-06-09 NOTE — Research (Signed)
AEGIS V3  Pt came in today for his infusion. Infusion clinic called to let me know that his HR was 157. I went to see patient and called Dr Riley Kill to meet me there. Patient said he didn't feel good but no shortness of breath or chest pains. He stated that he saw Dr Nehemiah Settle last week and everything checked out fine. He said he had been nauseated since admission with his heart attack. I did an EKG and it showed he was in AFlutter. I asked the patient if he had changed any medications or taken anything over the counter. He told me that he took Sudafed this am around 3 am due to his ears and head sinus area was hurting.  Dr Riley Kill came to assess patient, see his note below.  Discussed with patient that we were going to take him to the ER. Went to ER to talk with triage nurse and talked with wife.  Took patient to ER to be assessed further.

## 2020-06-10 ENCOUNTER — Inpatient Hospital Stay (HOSPITAL_COMMUNITY): Payer: BC Managed Care – PPO | Admitting: Anesthesiology

## 2020-06-10 ENCOUNTER — Ambulatory Visit: Payer: BC Managed Care – PPO | Admitting: General Practice

## 2020-06-10 ENCOUNTER — Encounter (HOSPITAL_COMMUNITY): Payer: Self-pay | Admitting: Cardiovascular Disease

## 2020-06-10 ENCOUNTER — Inpatient Hospital Stay (HOSPITAL_COMMUNITY): Payer: BC Managed Care – PPO

## 2020-06-10 ENCOUNTER — Encounter (HOSPITAL_COMMUNITY)
Admission: EM | Disposition: A | Discharge status: Home / Self Care | Payer: Self-pay | Source: Ambulatory Visit | Attending: Cardiovascular Disease

## 2020-06-10 DIAGNOSIS — R05 Cough: Secondary | ICD-10-CM | POA: Diagnosis present

## 2020-06-10 DIAGNOSIS — I4892 Unspecified atrial flutter: Secondary | ICD-10-CM

## 2020-06-10 DIAGNOSIS — R059 Cough, unspecified: Secondary | ICD-10-CM | POA: Diagnosis present

## 2020-06-10 DIAGNOSIS — I10 Essential (primary) hypertension: Secondary | ICD-10-CM

## 2020-06-10 HISTORY — PX: TEE WITHOUT CARDIOVERSION: SHX5443

## 2020-06-10 HISTORY — PX: CARDIOVERSION: SHX1299

## 2020-06-10 LAB — URINALYSIS, ROUTINE W REFLEX MICROSCOPIC
Bacteria, UA: NONE SEEN
Bilirubin Urine: NEGATIVE
Glucose, UA: >=500 mg/dL — AB
Hgb urine dipstick: NEGATIVE
Ketones, ur: NEGATIVE mg/dL
Leukocytes,Ua: NEGATIVE
Nitrite: NEGATIVE
Protein, ur: NEGATIVE mg/dL
Specific Gravity, Urine: 1.043 — ABNORMAL HIGH (ref 1.005–1.030)
pH: 5 (ref 5.0–8.0)

## 2020-06-10 LAB — GLUCOSE, CAPILLARY
Glucose-Capillary: 166 mg/dL — ABNORMAL HIGH (ref 70–99)
Glucose-Capillary: 172 mg/dL — ABNORMAL HIGH (ref 70–99)
Glucose-Capillary: 132 mg/dL — ABNORMAL HIGH (ref 70–99)
Glucose-Capillary: 132 mg/dL — ABNORMAL HIGH (ref 70–99)
Glucose-Capillary: 178 mg/dL — ABNORMAL HIGH (ref 70–99)

## 2020-06-10 LAB — CBC
HCT: 33.8 % — ABNORMAL LOW (ref 39.0–52.0)
Hemoglobin: 10.8 g/dL — ABNORMAL LOW (ref 13.0–17.0)
MCH: 30.3 pg (ref 26.0–34.0)
MCHC: 32 g/dL (ref 30.0–36.0)
MCV: 94.9 fL (ref 80.0–100.0)
Platelets: 554 10*3/uL — ABNORMAL HIGH (ref 150–400)
RBC: 3.56 MIL/uL — ABNORMAL LOW (ref 4.22–5.81)
RDW: 12.3 % (ref 11.5–15.5)
WBC: 10.9 10*3/uL — ABNORMAL HIGH (ref 4.0–10.5)
nRBC: 0 % (ref 0.0–0.2)

## 2020-06-10 LAB — BASIC METABOLIC PANEL
Anion gap: 10 (ref 5–15)
BUN: 16 mg/dL (ref 6–20)
CO2: 23 mmol/L (ref 22–32)
Calcium: 8.8 mg/dL — ABNORMAL LOW (ref 8.9–10.3)
Chloride: 101 mmol/L (ref 98–111)
Creatinine, Ser: 1.02 mg/dL (ref 0.61–1.24)
GFR calc Af Amer: >60 mL/min (ref >60)
GFR calc non Af Amer: >60 mL/min (ref >60)
Glucose, Bld: 171 mg/dL — ABNORMAL HIGH (ref 70–99)
Potassium: 4.2 mmol/L (ref 3.5–5.1)
Sodium: 134 mmol/L — ABNORMAL LOW (ref 135–145)

## 2020-06-10 SURGERY — ECHOCARDIOGRAM, TRANSESOPHAGEAL
Anesthesia: General

## 2020-06-10 MED ORDER — BUTAMBEN-TETRACAINE-BENZOCAINE 2-2-14 % EX AERO
INHALATION_SPRAY | CUTANEOUS | Status: DC | PRN
  Administered 2020-06-10: 1 via TOPICAL

## 2020-06-10 MED ORDER — IOHEXOL 9 MG/ML PO SOLN
500.0000 mL | ORAL | Status: AC
  Administered 2020-06-10 (×2): 500 mL via ORAL

## 2020-06-10 MED ORDER — PROPOFOL 500 MG/50ML IV EMUL
INTRAVENOUS | Status: DC | PRN
  Administered 2020-06-10: 125 ug/kg/min via INTRAVENOUS

## 2020-06-10 MED ORDER — IOHEXOL 300 MG/ML  SOLN
100.0000 mL | Freq: Once | INTRAMUSCULAR | Status: AC | PRN
  Administered 2020-06-10: 100 mL via INTRAVENOUS

## 2020-06-10 MED ORDER — LIDOCAINE 2% (20 MG/ML) 5 ML SYRINGE
INTRAMUSCULAR | Status: DC | PRN
  Administered 2020-06-10: 100 mg via INTRAVENOUS

## 2020-06-10 NOTE — Transfer of Care (Signed)
Immediate Anesthesia Transfer of Care Note  Patient: [name] Merritt  Procedure(s) Performed: TRANSESOPHAGEAL ECHOCARDIOGRAM (TEE) (N/A ) CARDIOVERSION (N/A )  Patient Location: PACU and Endoscopy Unit  Anesthesia Type:MAC  Level of Consciousness: patient cooperative and responds to stimulation  Airway & Oxygen Therapy: Patient Spontanous Breathing and Patient connected to nasal cannula oxygen  Post-op Assessment: Report given to RN and Post -op Vital signs reviewed and stable  Post vital signs: Reviewed and stable  Last Vitals:  Vitals Value Taken Time  BP 103/85 06/10/20 1054  Temp    Pulse 78 06/10/20 1055  Resp 30 06/10/20 1055  SpO2 98 % 06/10/20 1055  Vitals shown include unvalidated device data.  Last Pain:  Vitals:   06/10/20 0900  TempSrc: Oral  PainSc: 0-No pain      Patients Stated Pain Goal: 1 (27/07/86 7544)  Complications: No complications documented.

## 2020-06-10 NOTE — Interval H&P Note (Signed)
History and Physical Interval Note:  06/10/2020 10:23 AM  [name] Merritt  has presented today for surgery, with the diagnosis of AFIB.  The various methods of treatment have been discussed with the patient and family. After consideration of risks, benefits and other options for treatment, the patient has consented to  Procedure(s): TRANSESOPHAGEAL ECHOCARDIOGRAM (TEE) (N/A) CARDIOVERSION (N/A) as a surgical intervention.  The patient's history has been reviewed, patient examined, no change in status, stable for surgery.  I have reviewed the patient's chart and labs.  Questions were answered to the patient's satisfaction.     Little Ishikawa

## 2020-06-10 NOTE — Anesthesia Postprocedure Evaluation (Signed)
Anesthesia Post Note  Patient: [name] Merritt  Procedure(s) Performed: TRANSESOPHAGEAL ECHOCARDIOGRAM (TEE) (N/A ) CARDIOVERSION (N/A )     Patient location during evaluation: Endoscopy Anesthesia Type: General Level of consciousness: awake and alert Pain management: pain level controlled Vital Signs Assessment: post-procedure vital signs reviewed and stable Respiratory status: spontaneous breathing, nonlabored ventilation and respiratory function stable Cardiovascular status: blood pressure returned to baseline and stable Postop Assessment: no apparent nausea or vomiting Anesthetic complications: no   No complications documented.  Last Vitals:  Vitals:   06/10/20 1105 06/10/20 1115  BP: 132/85 (!) 132/91  Pulse: 81 81  Resp: (!) 22 (!) 22  Temp:    SpO2: 100% 98%    Last Pain:  Vitals:   06/10/20 1115  TempSrc:   PainSc: 0-No pain                 Creedence Kunesh,W. EDMOND

## 2020-06-10 NOTE — Progress Notes (Deleted)
  Echocardiogram 2D Echocardiogram has been performed.  Delcie Roch 06/10/2020, 11:05 AM

## 2020-06-10 NOTE — CV Procedure (Signed)
   TRANSESOPHAGEAL ECHOCARDIOGRAM GUIDED DIRECT CURRENT CARDIOVERSION  NAME:  [name] Merritt   MRN: [id number] DOB:  [birth year]   ADMIT DATE: 06/08/2020  INDICATIONS: Symptomatic atrial flutter  PROCEDURE:   Informed consent was obtained prior to the procedure. The risks, benefits and alternatives for the procedure were discussed and the patient comprehended these risks.  Risks include, but are not limited to, cough, sore throat, vomiting, nausea, somnolence, esophageal and stomach trauma or perforation, bleeding, low blood pressure, aspiration, pneumonia, infection, trauma to the teeth and death.    After a procedural time-out, the oropharynx was anesthetized and the patient was sedated by the anesthesia service. The transesophageal probe was inserted in the esophagus and stomach without difficulty and multiple views were obtained. Anesthesia was monitored by [name], CRNA and Dr. [name].  COMPLICATIONS:    Complications: No complications Patient tolerated procedure well.  FINDINGS:  No LAA thrombus  CARDIOVERSION:     Indications:  Symptomatic Atrial Flutter.  Procedure Details:  Once the TEE was complete, the patient had the defibrillator pads placed in the anterior and posterior position. Once an appropriate level of sedation was confirmed, the patient was cardioverted x 1 with 100J of biphasic synchronized energy.  The patient converted to NSR with rate 70s.  There were no apparent complications.  The patient had normal neuro status and respiratory status post procedure with vitals stable as recorded elsewhere.  Adequate airway was maintained throughout and vital signs monitored per protocol.  [name] MD [facility]  [address], [address] [place], [place] [address] [phone]   10:59 AM

## 2020-06-10 NOTE — Consult Note (Signed)
Medical Consultation   [name] Merritt  PFX:[id number]  DOB: [birth year]  DOA: 06/08/2020  PCP: Renford Dills, MD   Outpatient Specialists: Tresa Endo - cardiology; Celine Mans - pulmonology   Requesting physician: Excell Seltzer - cardiology  Reason for consultation: persistent low grade fevers of unknown origin    History of Present Illness: [name] Merritt is an 50 y.o. male with h/o class 2 obesity; CAD (s/p DES to LAD); HTN; HLD; and DM who presented on 8/17 after being sent from the cardiology infusion clinic due to tachycardia.  He was found to be in new-onset atrial flutter. He wasn't really feeling bad but his heart was beating fast and they sent him to the ER.  He has been doing since then.  He feels very fatigued.  +cough, chronic and episodic.  Cough is productive, mucus - sometimes clear and other times yellow.  SOB with cough and exertion.  +subjective fever at home since MI on 8/8.  +intermittent night sweats.  +n/v once or twice a week or when he gets very winded.  No urinary symptoms.  Possible unintentional weight loss.    Review of Systems:  ROS As per HPI otherwise 10 point review of systems negative.    Past Medical History: Past Medical History:  Diagnosis Date  . Coronary artery disease   . Diabetes mellitus without complication (HCC)   . High cholesterol   . Hypertension   . Myocardial infarction (HCC)   . New onset atrial flutter (HCC) 06/09/2020  . Overweight     Past Surgical History: Past Surgical History:  Procedure Laterality Date  . CORONARY STENT INTERVENTION N/A 05/30/2020   Procedure: CORONARY STENT INTERVENTION;  Surgeon: Lennette Bihari, MD;  Location: [facility] INVASIVE CV LAB;  Service: Cardiovascular;  Laterality: N/A;  . CORONARY/GRAFT ACUTE MI REVASCULARIZATION N/A 05/30/2020   Procedure: Coronary/Graft Acute MI Revascularization;  Surgeon: Lennette Bihari, MD;  Location: [facility] INVASIVE CV LAB;  Service: Cardiovascular;  Laterality: N/A;  . LEFT  HEART CATH AND CORONARY ANGIOGRAPHY N/A 05/30/2020   Procedure: LEFT HEART CATH AND CORONARY ANGIOGRAPHY;  Surgeon: Lennette Bihari, MD;  Location: [facility] INVASIVE CV LAB;  Service: Cardiovascular;  Laterality: N/A;  . torn rotator cuff  2018   2018     Allergies:   Allergies  Allergen Reactions  . Pineapple Itching and Other (See Comments)    All-over itching (mouth, too)  . Tomato Itching and Other (See Comments)    All-over itching (mouth, too) Ketchup, also  . Rosuvastatin Calcium Hives and Rash     Social History:  reports that he has never smoked. He has quit using smokeless tobacco.  His smokeless tobacco use included chew. He reports current alcohol use. He reports previous drug use. Drug: Marijuana.   Family History: Family History  Problem Relation Age of Onset  . Hypertension Mother   . Diabetes Mother   . Hypertension Father   . Diabetes Father   . Diabetes Maternal Grandmother   . Hypertension Maternal Grandmother   . Diabetes Maternal Grandfather   . Hypertension Maternal Grandfather   . Diabetes Paternal Grandmother   . Hypertension Paternal Grandmother   . Diabetes Paternal Grandfather   . Hypertension Paternal Grandfather   . Asthma Neg Hx       Physical Exam: Vitals:   06/10/20 1055 06/10/20 1105 06/10/20 1115 06/10/20 1135  BP: 103/85 132/85 (!) 132/91 117/90  Pulse: 78  81 81 81  Resp: (!) 30 (!) 22 (!) 22 18  Temp: 98.8 F (37.1 C)   99.1 F (37.3 C)  TempSrc: Axillary   Oral  SpO2: 98% 100% 98% 99%  Weight:      Height:        Constitutional: Alert and awake, oriented x3, not in any acute distress. Eyes:  EOMI, irises appear normal, anicteric sclera,  ENMT: external ears and nose appear normal, normal hearing, Lips appear normal, oropharynx mucosa, tongue appear normal  Neck: neck appears normal, no masses, normal ROM, no thyromegaly, no JVD  CVS: S1-S2 clear, no murmur rubs or gallops, no LE edema, normal pedal pulses  Respiratory:  clear  to auscultation bilaterally, no wheezing, rales or rhonchi. Respiratory effort normal. No accessory muscle use.  Abdomen: soft nontender, nondistended Musculoskeletal: : no cyanosis, clubbing or edema noted bilaterally Neuro: Cranial nerves II-XII intact, strength, sensation, reflexes Psych: judgement and insight appear normal, stable mood and affect, mental status Skin: no rashes or lesions or ulcers, no induration or nodules    Data reviewed:  I have personally reviewed the recent labs and imaging studies  Pertinent Labs:   Glucose 171 WBC 10.9 Hgb 10.8 Platelets 554   Inpatient Medications:   Scheduled Meds: . apixaban  5 mg Oral BID  . aspirin  81 mg Oral Daily  . atorvastatin  80 mg Oral q1800  . clopidogrel  75 mg Oral Daily  . dapagliflozin propanediol  5 mg Oral Daily  . insulin aspart  0-9 Units Subcutaneous TID WC  . [START ON 06/11/2020] insulin glargine  55 Units Subcutaneous Daily  . metoprolol succinate  25 mg Oral Daily  . pantoprazole  40 mg Oral BID   Continuous Infusions:   Radiological Exams on Admission: No results found.  Impression/Recommendations Principal Problem:   Atrial flutter (HCC) Active Problems:   Hypertension   Hyperlipidemia with target LDL less than 70   DM type 2, goal HbA1c < 7% (HCC)   OSA (obstructive sleep apnea)   Morbid obesity (HCC)   Cough  New onset atrial flutter, recent MI -Patient was found to incidentally be in new-onset aflutter and was admitted -He is planned for TEE cardioversion -Cardiac meds are on hold per cardiology - Coreg, Dan Humphreys -He has been started on Eliquis -Now on ASA and Plavix -Also started on Toprol XL during hospitalization  Cough, borderline fever -On prn albuterol for possible asthma, but was seen by pulm in May with greater concern for OSA and OHS as the cause of his cough -Nonspecific symptoms other than chronic intermittent cough -During this admission, he has not had true  fever - always <100.4 -Very mild leukocytosis -Transient thrombocytosis since admission -COVID negative -CXR unremarkable -Will order CT C/A/P empirically -Will order blood cultures if patient has a true fever -Will order UA -Would not suggest empiric antibiotics at this time or further evaluation other than as above  HTN -On Toprol XL currently  DM -Will check A1c -Consider holding Farxiga while inpatient -Continue Lantus -Cover with moderate-scale SSI  HLD -Continue Lipitor  Class 2 obesity -BMI 38.15 -Weight loss should be encouraged -Outpatient PCP/bariatric medicine/bariatric surgery f/u encouraged  OSA -Will order qhs CPAP to see if this improves mild SOB    Thank you for this consultation.  Our [facility] hospitalist team will follow the patient with you at this time; if negative evaluation, TRH may sign off tomorrow.   Time Spent: 50 minutes  Jonah Blue  M.D. [facility] 06/10/2020, 2:48 PM

## 2020-06-10 NOTE — Anesthesia Preprocedure Evaluation (Addendum)
Anesthesia Evaluation  Patient identified by MRN, date of birth, ID band Patient awake    Reviewed: Allergy & Precautions, H&P , NPO status , Patient's Chart, lab work & pertinent test results  Airway Mallampati: II  TM Distance: >3 FB Neck ROM: Full    Dental no notable dental hx. (+) Teeth Intact, Dental Advisory Given   Pulmonary sleep apnea ,    Pulmonary exam normal breath sounds clear to auscultation       Cardiovascular hypertension, Pt. on medications and Pt. on home beta blockers + CAD and + Past MI  + dysrhythmias Atrial Fibrillation  Rhythm:Regular Rate:Tachycardia     Neuro/Psych negative neurological ROS  negative psych ROS   GI/Hepatic negative GI ROS, Neg liver ROS,   Endo/Other  diabetes, Insulin Dependent, Oral Hypoglycemic AgentsMorbid obesity  Renal/GU negative Renal ROS  negative genitourinary   Musculoskeletal   Abdominal   Peds  Hematology negative hematology ROS (+)   Anesthesia Other Findings   Reproductive/Obstetrics negative OB ROS                            Anesthesia Physical Anesthesia Plan  ASA: III  Anesthesia Plan: General   Post-op Pain Management:    Induction: Intravenous  PONV Risk Score and Plan: 2 and Propofol infusion and Treatment may vary due to age or medical condition  Airway Management Planned: Nasal Cannula  Additional Equipment:   Intra-op Plan:   Post-operative Plan:   Informed Consent: I have reviewed the patients History and Physical, chart, labs and discussed the procedure including the risks, benefits and alternatives for the proposed anesthesia with the patient or authorized representative who has indicated his/her understanding and acceptance.     Dental advisory given  Plan Discussed with: CRNA  Anesthesia Plan Comments:         Anesthesia Quick Evaluation

## 2020-06-10 NOTE — Progress Notes (Addendum)
Progress Note  Patient Name: [name] Merritt Date of Encounter: 06/10/2020  Primary Cardiologist: Nicki Guadalajara, MD  Subjective   Patient reports overall feeling pretty good. He does report last night he saw some streaky blood when straining which is not new for him. He denies any recent melena this admission but does recall seeing a black stool around the time of his recent heart attack. He continues to mount occasional low grade temp, 100.2 last night. When this happens he just feels generally crummy but no overt rigors. Denies any tick bites.  Inpatient Medications    Scheduled Meds: . apixaban  5 mg Oral BID  . aspirin  81 mg Oral Daily  . atorvastatin  80 mg Oral q1800  . clopidogrel  75 mg Oral Daily  . dapagliflozin propanediol  5 mg Oral Daily  . insulin aspart  0-9 Units Subcutaneous TID WC  . [START ON 06/11/2020] insulin glargine  55 Units Subcutaneous Daily  . metoprolol succinate  25 mg Oral Daily  . pantoprazole  40 mg Oral BID   Continuous Infusions: . diltiazem (CARDIZEM) infusion 5 mg/hr (06/10/20 0546)   PRN Meds: acetaminophen, ondansetron (ZOFRAN) IV, zolpidem   Vital Signs    Vitals:   06/10/20 1055 06/10/20 1105 06/10/20 1115 06/10/20 1135  BP: 103/85 132/85 (!) 132/91 117/90  Pulse: 78 81 81 81  Resp: (!) 30 (!) 22 (!) 22 18  Temp: 98.8 F (37.1 C)   99.1 F (37.3 C)  TempSrc: Axillary   Oral  SpO2: 98% 100% 98% 99%  Weight:      Height:        Intake/Output Summary (Last 24 hours) at 06/10/2020 1211 Last data filed at 06/10/2020 0843 Gross per 24 hour  Intake 926.71 ml  Output 1400 ml  Net -473.29 ml   Last 3 Weights 06/10/2020 06/08/2020 06/08/2020  Weight (lbs) 297 lb 1.6 oz 298 lb 298 lb 9.6 oz  Weight (kg) 134.764 kg 135.172 kg 135.444 kg     Telemetry    Now NSR - Personally Reviewed  ECG    NSR 82bpm TWI I, avL, as well as V2-V6 with biphasic appearance in these leads with mild residual STE V2-V5 - Personally  Reviewed  Physical Exam   GEN: No acute distress.  HEENT: Normocephalic, atraumatic, sclera non-icteric. Neck: No JVD or bruits. Cardiac: RRR no murmurs, rubs, or gallops.  Radials/DP/PT 1+ and equal bilaterally.  Respiratory: Clear to auscultation bilaterally. Breathing is unlabored. GI: Soft, nontender, non-distended, BS +x 4. MS: no deformity. Extremities: No clubbing or cyanosis. No edema. Distal pedal pulses are 2+ and equal bilaterally. Neuro:  AAOx3. Follows commands. Psych:  Responds to questions appropriately with a normal affect.  Labs    High Sensitivity Troponin:   Recent Labs  Lab 05/30/20 0952 06/08/20 1222 06/08/20 1530  TROPONINIHS 7,578* 252* 241*      Cardiac EnzymesNo results for input(s): TROPONINI in the last 168 hours. No results for input(s): TROPIPOC in the last 168 hours.   Chemistry Recent Labs  Lab 06/08/20 1222 06/09/20 0324 06/10/20 0414  NA 134* 134* 134*  K 4.5 4.2 4.2  CL 102 102 101  CO2 21* 22 23  GLUCOSE 174* 144* 171*  BUN 16 17 16   CREATININE 1.17 1.16 1.02  CALCIUM 8.7* 8.7* 8.8*  GFRNONAA >60 >60 >60  GFRAA >60 >60 >60  ANIONGAP 11 10 10      Hematology Recent Labs  Lab 06/08/20 1222 06/10/20 0414  WBC  11.3* 10.9*  RBC 3.99* 3.56*  HGB 12.0* 10.8*  HCT 37.7* 33.8*  MCV 94.5 94.9  MCH 30.1 30.3  MCHC 31.8 32.0  RDW 12.1 12.3  PLT 576* 554*    BNPNo results for input(s): BNP, PROBNP in the last 168 hours.   DDimer No results for input(s): DDIMER in the last 168 hours.   Radiology    DG Chest Portable 1 View  Result Date: 06/08/2020 CLINICAL DATA:  Tachycardia EXAM: PORTABLE CHEST 1 VIEW COMPARISON:  05/30/2020 FINDINGS: The heart size and mediastinal contours are within normal limits. Chronic elevation of the right hemidiaphragm with right basilar atelectasis. Left lung is clear. No pleural effusion or pneumothorax. The visualized skeletal structures are unremarkable. IMPRESSION: Chronic elevation of the right  hemidiaphragm with right basilar atelectasis. No acute cardiopulmonary process. Electronically Signed   By: Duanne Guess D.O.   On: 06/08/2020 12:40    Cardiac Studies   2D Echo 05/31/20 1. Akinesis of the mid septum into the apex consistent with LAD  infarction. Contrast imaging shows no evidence of LV thrombus. Left  ventricular ejection fraction, by estimation, is 35 to 40%. The left  ventricle has moderately decreased function. The  left ventricle demonstrates regional wall motion abnormalities (see  scoring diagram/findings for description). Left ventricular diastolic  parameters are consistent with Grade II diastolic dysfunction  (pseudonormalization). Elevated left atrial pressure.  2. Right ventricular systolic function is normal. The right ventricular  size is normal. There is normal pulmonary artery systolic pressure. The  estimated right ventricular systolic pressure is 19.3 mmHg.  3. The mitral valve is grossly normal. Trivial mitral valve  regurgitation. No evidence of mitral stenosis.  4. The aortic valve is tricuspid. Aortic valve regurgitation is not  visualized. Mild aortic valve sclerosis is present, with no evidence of  aortic valve stenosis.  5. The inferior vena cava is normal in size with greater than 50%  respiratory variability, suggesting right atrial pressure of 3 mmHg.   Conclusion(s)/Recommendation(s): Findings consistent with ischemic  cardiomyopathy.   LHC 05/30/20  3rd Mrg lesion is 50% stenosed.  LPDA lesion is 70% stenosed.  LPAV-1 lesion is 30% stenosed.  LPAV-2 lesion is 30% stenosed.  1st Diag lesion is 70% stenosed.  Ramus lesion is 20% stenosed.  Mid LAD lesion is 100% stenosed.  Post intervention, there is a 0% residual stenosis.  A stent was successfully placed.  Acute anterolateral ST segment elevation myocardial infarction secondary to total occlusion of the mid LAD with TIMI 0 flow and absence of  collaterals.  Concomitant CAD with tortuous 1st diagonal vessel with 70% narrowing beyond a sharp bend in the vessel, 20% narrowing beyond a sharp bend in the ramus immediate vessel, and dominant left circumflex coronary artery with 50% focal OM 3 stenosis, distal 30% stenoses in the AV groove, and focal 70% stenosis in the midportion of a small PLA branch. Normal small nondominant RCA.  LVEDP 12 millimeters Hg.   Successful PCI to the totally occluded mid LAD with ultimate insertion of a Resolute Onyx 3.0 x 22 mm DES stent postdilated to 3.25 mm with digital narrowing of 0% and restoration of TIMI-3 flow.  RECOMMENDATION: DAPT for minimum of 1 year. Aggressive lipid-lowering therapy with titration of atorvastatin to 80 mg daily. We will reinitiate ARB therapy with losartan initially at lower dose, add low-dose beta-blocker therapy, and obtain 2D echo Doppler study in a.m. optimal blood pressure control with target blood pressure less than 130/80 and ideal blood  pressure less than 120/80. We will diabetes control. If patient has LV dysfunction, SGLT 2 inhibition can be considered.  TEE report pending - no LAA thrombus   Patient Profile    49 y.o. male with recent anterior STEMI found tohaveanoccluded LAD which was successfully stented with DES/PCI placement (with residual50% stenosis in OM and 70% stenosis in left PDA), DM2, HLD, HTN, ICM EF 35-40%, chronic cough (was pending outpatient cardiology OV), sinus issues for 5-6 months. During admission he was noted to have low grade fever felt possibly reactive to post MI. Blood cultures and UA were negative during last admission. He was discharged on 06/01/20. [facility] and was round to be in rapid atrial flutter so was sent to the hospital for further management. Covid test negative.   Assessment & Plan    1. New onset atrialflutterwith RVR, unclear onset (sometime between discharge 8/10 and  infusion visit 8/17) - s/p TEE-DCCV today with restoration of NSR - carvedilol transitioned to metoprolol for HR control with less BP effect and better beta-1 selectivity - stop IV diltiazem (5mg /hr currently) - less ideal given LV dysfunction - now on Eliquis (CHADSVASC 4)  2. CAD with recent acute anterior STEMI s/p DES to occluded LAD - no recurrent anginal symptoms, troponin downtrending - onPTA DAPT withASA and Brilinta -> transitioned off Brilinta and onto Plavix given need for [facility] with atrial flutter - ? will review plan for triple therapy with MD given declining Hgb  3. Ischemic cardiomyopathy: - Echocardiogram performed last admission withLVEFat35-40% - Holding Entresto for now to allow for greater BP if needed - will ask MD if he can look at TEE images to get an estimate of LV function (and also exclude any vegetations that could be insidiously causing #4) - consider resumption of Entresto - CXR OK, appears euvolemic  4. Fever of unknown origin, recent chronic cough/sinus issues - blood cx and UA negative last admission - Covid negative - continues to mount low grade temp for unclear reasons - will consult internal medicine for recommendations given different issues - chronic cough, sinus issues, leukocytosis, thrombocytosis - appreciate their input!  5. Anemia, normocytic - Hgb 13.6 on admission, 12.0 yesterday, 10.8 this AM - previously in the 13-15 range last admission - pt reports some streaky BRBPR with straining, also reports episode of dark stool last admission which was not known at that time - continue PPI and trend Hgb - pending input from Dr. Excell Seltzer, hemodynamically stable  6. Uncontrolled DM2: A1c 9.8 on last admission - 1/2 dose Lantus today due to NPO status - resume normal regimen tomorrow - continue [place]  7.Hyperlipidemia: - LDL, 182 on last admission with goal LDL <70 - Continuehigh dose lipitor 80 mg daily and recommend recheck lipid panel at  next OV  8. Morbid obesitywith OSA - recent dx of sleep apnea by [place] [facility]and is awaiting starting CPAP   For questions or updates, please contact [facility] Please consult [email or web address] for contact info under Cardiology/STEMI.  Signed, Laurann Montana, PA-C 06/10/2020, 12:11 PM    Patient seen, examined. Available data reviewed. Agree with findings, assessment, and plan as outlined by Ronie Spies, PA-C.  The patient is independently interviewed and examined.  He is stable after undergoing TEE guided cardioversion.  He is in sinus rhythm with a heart rate of 75 bpm, diltiazem drip is currently running at 5 mg/h.  This will be discontinued.  He will continue on metoprolol succinate  25 mg daily.  He is appropriately anticoagulated with apixaban.  The patient has had issues with recurrent low-grade temperatures, cough, and now has been noted to have anemia with some blood in his stool when he strains.  We are going to ask the hospitalist service to evaluate him to make sure no further inpatient testing is indicated.  I would anticipate discharging him home tomorrow morning as long as he remains clinically stable.  Tonny Bollman, M.D. 06/10/2020 12:54 PM

## 2020-06-11 ENCOUNTER — Encounter (HOSPITAL_COMMUNITY): Payer: Self-pay | Admitting: Cardiology

## 2020-06-11 ENCOUNTER — Other Ambulatory Visit (HOSPITAL_COMMUNITY): Payer: Self-pay | Admitting: Cardiology

## 2020-06-11 DIAGNOSIS — E785 Hyperlipidemia, unspecified: Secondary | ICD-10-CM

## 2020-06-11 DIAGNOSIS — J9811 Atelectasis: Secondary | ICD-10-CM

## 2020-06-11 DIAGNOSIS — I10 Essential (primary) hypertension: Secondary | ICD-10-CM

## 2020-06-11 DIAGNOSIS — G4733 Obstructive sleep apnea (adult) (pediatric): Secondary | ICD-10-CM

## 2020-06-11 DIAGNOSIS — R05 Cough: Secondary | ICD-10-CM

## 2020-06-11 DIAGNOSIS — E119 Type 2 diabetes mellitus without complications: Secondary | ICD-10-CM

## 2020-06-11 LAB — BASIC METABOLIC PANEL
Anion gap: 10 (ref 5–15)
BUN: 15 mg/dL (ref 6–20)
CO2: 23 mmol/L (ref 22–32)
Calcium: 8.6 mg/dL — ABNORMAL LOW (ref 8.9–10.3)
Chloride: 102 mmol/L (ref 98–111)
Creatinine, Ser: 1.07 mg/dL (ref 0.61–1.24)
GFR calc Af Amer: >60 mL/min (ref >60)
GFR calc non Af Amer: >60 mL/min (ref >60)
Glucose, Bld: 180 mg/dL — ABNORMAL HIGH (ref 70–99)
Potassium: 4.3 mmol/L (ref 3.5–5.1)
Sodium: 135 mmol/L (ref 135–145)

## 2020-06-11 LAB — GLUCOSE, CAPILLARY
Glucose-Capillary: 213 mg/dL — ABNORMAL HIGH (ref 70–99)
Glucose-Capillary: 130 mg/dL — ABNORMAL HIGH (ref 70–99)

## 2020-06-11 LAB — CBC
HCT: 32.6 % — ABNORMAL LOW (ref 39.0–52.0)
Hemoglobin: 10.2 g/dL — ABNORMAL LOW (ref 13.0–17.0)
MCH: 30.1 pg (ref 26.0–34.0)
MCHC: 31.3 g/dL (ref 30.0–36.0)
MCV: 96.2 fL (ref 80.0–100.0)
Platelets: 529 10*3/uL — ABNORMAL HIGH (ref 150–400)
RBC: 3.39 MIL/uL — ABNORMAL LOW (ref 4.22–5.81)
RDW: 12.5 % (ref 11.5–15.5)
WBC: 8.4 10*3/uL (ref 4.0–10.5)
nRBC: 0 % (ref 0.0–0.2)

## 2020-06-11 MED ORDER — METOPROLOL SUCCINATE ER 25 MG PO TB24: 25.0000 mg | ORAL_TABLET | Freq: Every day | ORAL | 3 refills | Status: DC

## 2020-06-11 MED ORDER — CLOPIDOGREL BISULFATE 75 MG PO TABS: 75.0000 mg | ORAL_TABLET | Freq: Every day | ORAL | 3 refills | Status: DC

## 2020-06-11 MED ORDER — SACUBITRIL-VALSARTAN 24-26 MG PO TABS
1.0000 | ORAL_TABLET | Freq: Two times a day (BID) | ORAL | Status: DC
  Administered 2020-06-11: 1 via ORAL
  Filled 2020-06-11: qty 1

## 2020-06-11 MED ORDER — APIXABAN 5 MG PO TABS: 5.0000 mg | ORAL_TABLET | Freq: Two times a day (BID) | ORAL | 3 refills | Status: DC

## 2020-06-11 NOTE — Progress Notes (Addendum)
Progress Note  Patient Name: [name] Merritt Date of Encounter: 06/11/2020  Primary Cardiologist: Nicki Guadalajara, MD  Subjective   Feeling well today. Maintaining NSR.   Inpatient Medications    Scheduled Meds: . apixaban  5 mg Oral BID  . aspirin  81 mg Oral Daily  . atorvastatin  80 mg Oral q1800  . clopidogrel  75 mg Oral Daily  . dapagliflozin propanediol  5 mg Oral Daily  . insulin aspart  0-9 Units Subcutaneous TID WC  . insulin glargine  55 Units Subcutaneous Daily  . metoprolol succinate  25 mg Oral Daily  . pantoprazole  40 mg Oral BID   Continuous Infusions:  PRN Meds: acetaminophen, ondansetron (ZOFRAN) IV, zolpidem   Vital Signs    Vitals:   06/10/20 1712 06/10/20 2026 06/11/20 0522 06/11/20 0524  BP: (!) 143/91 (!) 144/90  (!) 134/94  Pulse: 88 92  76  Resp: 16 19  18   Temp: 98 F (36.7 C) 99.9 F (37.7 C)  98.7 F (37.1 C)  TempSrc: Oral Oral    SpO2: 99% 97%  98%  Weight:   135.1 kg   Height:        Intake/Output Summary (Last 24 hours) at 06/11/2020 0910 Last data filed at 06/10/2020 1900 Gross per 24 hour  Intake 43.28 ml  Output --  Net 43.28 ml   Filed Weights   06/08/20 1158 06/10/20 0446 06/11/20 0522  Weight: 135.2 kg 134.8 kg 135.1 kg    Physical Exam   General: Overweight, NAD Neck: Negative for carotid bruits. No JVD Lungs: Diminished in bilateral lower lobes. Breathing is unlabored. Cardiovascular: RRR with S1 S2. No murmur Abdomen: Soft, non-tender, non-distended. No obvious abdominal masses. Extremities: No edema. Radial pulses 2+ bilaterally Neuro: Alert and oriented. No focal deficits. No facial asymmetry. MAE spontaneously. Psych: Responds to questions appropriately with normal affect.    Labs    Chemistry Recent Labs  Lab 06/09/20 0324 06/10/20 0414 06/11/20 0508  NA 134* 134* 135  K 4.2 4.2 4.3  CL 102 101 102  CO2 22 23 23   GLUCOSE 144* 171* 180*  BUN 17 16 15   CREATININE 1.16 1.02 1.07  CALCIUM  8.7* 8.8* 8.6*  GFRNONAA >60 >60 >60  GFRAA >60 >60 >60  ANIONGAP 10 10 10      Hematology Recent Labs  Lab 06/08/20 1222 06/10/20 0414 06/11/20 0508  WBC 11.3* 10.9* 8.4  RBC 3.99* 3.56* 3.39*  HGB 12.0* 10.8* 10.2*  HCT 37.7* 33.8* 32.6*  MCV 94.5 94.9 96.2  MCH 30.1 30.3 30.1  MCHC 31.8 32.0 31.3  RDW 12.1 12.3 12.5  PLT 576* 554* 529*    Cardiac EnzymesNo results for input(s): TROPONINI in the last 168 hours. No results for input(s): TROPIPOC in the last 168 hours.   BNPNo results for input(s): BNP, PROBNP in the last 168 hours.   DDimer No results for input(s): DDIMER in the last 168 hours.   Radiology    CT CHEST W CONTRAST  Result Date: 06/10/2020 CLINICAL DATA:  Persistent cough, atrial flutter, leukocytosis EXAM: CT CHEST, ABDOMEN, AND PELVIS WITH CONTRAST TECHNIQUE: Multidetector CT imaging of the chest, abdomen and pelvis was performed following the standard protocol during bolus administration of intravenous contrast. CONTRAST:  06/10/20 OMNIPAQUE IOHEXOL 300 MG/ML  SOLN COMPARISON:  06/08/2020, 01/01/2020 FINDINGS: CT CHEST FINDINGS Cardiovascular: The heart and great vessels are unremarkable without pericardial effusion. Stent is identified within the LAD distribution. Normal caliber of the  thoracic aorta. Mediastinum/Nodes: No enlarged mediastinal, hilar, or axillary lymph nodes. Thyroid gland, trachea, and esophagus demonstrate no significant findings. Lungs/Pleura: Scattered areas of atelectasis are seen at the lung bases. No airspace disease, effusion, or pneumothorax. Elevated right hemidiaphragm. Musculoskeletal: No acute or destructive bony lesions. Bilateral gynecomastia again noted. Reconstructed images demonstrate no additional findings. CT ABDOMEN PELVIS FINDINGS Hepatobiliary: Heterogeneous decreased attenuation of the liver consistent with hepatic steatosis. The gallbladder is unremarkable. No biliary dilation. Pancreas: Unremarkable. No pancreatic ductal  dilatation or surrounding inflammatory changes. Spleen: Normal in size without focal abnormality. Adrenals/Urinary Tract: Adrenal glands are unremarkable. Kidneys are normal, without renal calculi, focal lesion, or hydronephrosis. Bladder is unremarkable. Stomach/Bowel: No bowel obstruction or ileus. Normal appendix right lower quadrant. No bowel wall thickening or inflammatory change. Minimal sigmoid diverticulosis without diverticulitis. Vascular/Lymphatic: No significant vascular findings are present. No enlarged abdominal or pelvic lymph nodes. The Reproductive: Prostate is unremarkable. Other: No free fluid or free gas. No abdominal wall hernia. Musculoskeletal: No acute or destructive bony lesions. Reconstructed images demonstrate no additional findings. IMPRESSION: 1. No acute intrathoracic, intra-abdominal, or intrapelvic process. 2. Hepatic steatosis. 3. Minimal sigmoid diverticulosis without diverticulitis. 4. Stable gynecomastia. Electronically Signed   By: Sharlet Salina M.D.   On: 06/10/2020 20:04   CT ABDOMEN PELVIS W CONTRAST  Result Date: 06/10/2020 CLINICAL DATA:  Persistent cough, atrial flutter, leukocytosis EXAM: CT CHEST, ABDOMEN, AND PELVIS WITH CONTRAST TECHNIQUE: Multidetector CT imaging of the chest, abdomen and pelvis was performed following the standard protocol during bolus administration of intravenous contrast. CONTRAST:  OMNIPAQUE IOHEXOL 300 MG/ML  SOLN COMPARISON:  06/08/2020, 01/01/2020 FINDINGS: CT CHEST FINDINGS Cardiovascular: The heart and great vessels are unremarkable without pericardial effusion. Stent is identified within the LAD distribution. Normal caliber of the thoracic aorta. Mediastinum/Nodes: No enlarged mediastinal, hilar, or axillary lymph nodes. Thyroid gland, trachea, and esophagus demonstrate no significant findings. Lungs/Pleura: Scattered areas of atelectasis are seen at the lung bases. No airspace disease, effusion, or pneumothorax. Elevated right  hemidiaphragm. Musculoskeletal: No acute or destructive bony lesions. Bilateral gynecomastia again noted. Reconstructed images demonstrate no additional findings. CT ABDOMEN PELVIS FINDINGS Hepatobiliary: Heterogeneous decreased attenuation of the liver consistent with hepatic steatosis. The gallbladder is unremarkable. No biliary dilation. Pancreas: Unremarkable. No pancreatic ductal dilatation or surrounding inflammatory changes. Spleen: Normal in size without focal abnormality. Adrenals/Urinary Tract: Adrenal glands are unremarkable. Kidneys are normal, without renal calculi, focal lesion, or hydronephrosis. Bladder is unremarkable. Stomach/Bowel: No bowel obstruction or ileus. Normal appendix right lower quadrant. No bowel wall thickening or inflammatory change. Minimal sigmoid diverticulosis without diverticulitis. Vascular/Lymphatic: No significant vascular findings are present. No enlarged abdominal or pelvic lymph nodes. The Reproductive: Prostate is unremarkable. Other: No free fluid or free gas. No abdominal wall hernia. Musculoskeletal: No acute or destructive bony lesions. Reconstructed images demonstrate no additional findings. IMPRESSION: 1. No acute intrathoracic, intra-abdominal, or intrapelvic process. 2. Hepatic steatosis. 3. Minimal sigmoid diverticulosis without diverticulitis. 4. Stable gynecomastia. Electronically Signed   By: Sharlet Salina M.D.   On: 06/10/2020 20:04   ECHO TEE  Result Date: 06/10/2020    TRANSESOPHOGEAL ECHO REPORT   Patient Name:   [name] Merritt Date of Exam: 06/10/2020 Medical Rec #:  [id number]        Height:       74.0 in Accession #:    [id number]       Weight:       297.1 lb Date of Birth:  07-Sep-1971  BSA:          2.572 m Patient Age:    49 years         BP:           125/96 mmHg Patient Gender: M                HR:           190 bpm. Exam Location:  Inpatient Procedure: Transesophageal Echo Indications:     atrial flutter  History:         Patient has  prior history of Echocardiogram examinations, most                  recent 05/31/2020. Cardiomegaly, Arrythmias:Atrial Flutter; Risk                  Factors:Diabetes, Hypertension, Dyslipidemia and Sleep Apnea.  Sonographer:     Delcie Roch Referring Phys:  [id number] RUEAV W DUNN Diagnosing Phys: [name] MD PROCEDURE: After discussion of the risks and benefits of a TEE, an informed consent was obtained from the patient. The transesophogeal probe was passed without difficulty through the esophogus of the patient. Local oropharyngeal anesthetic was provided with Cetacaine. Sedation performed by different physician. The patient was monitored while under deep sedation. Anesthestetic sedation was provided intravenously by Anesthesiology:  of Propofol,  of Lidocaine. The patient developed no complications during the procedure. A direct current cardioversion was performed. IMPRESSIONS  1. Left ventricular ejection fraction, by estimation, is 40 to 45%. The left ventricle has mildly decreased function. The left ventricle demonstrates regional wall motion abnormalities. Apical hypokinesis.  2. Right ventricular systolic function is normal. The right ventricular size is normal.  3. The mitral valve is normal in structure. Trivial mitral valve regurgitation.  4. The aortic valve is tricuspid. Aortic valve regurgitation is not visualized. No aortic stenosis is present.  5. No left atrial/left atrial appendage thrombus was detected. FINDINGS  Left Ventricle: Left ventricular ejection fraction, by estimation, is 40 to 45%. The left ventricle has mildly decreased function. The left ventricle demonstrates regional wall motion abnormalities. The left ventricular internal cavity size was normal in size. There is no left ventricular hypertrophy. Right Ventricle: The right ventricular size is normal. Right vetricular wall thickness was not assessed. Right ventricular systolic function is normal. Left Atrium: Left  atrial size was normal in size. No left atrial/left atrial appendage thrombus was detected. Right Atrium: Right atrial size was normal in size. Pericardium: There is no evidence of pericardial effusion. Mitral Valve: The mitral valve is normal in structure. Trivial mitral valve regurgitation. Tricuspid Valve: The tricuspid valve is normal in structure. Tricuspid valve regurgitation is trivial. Aortic Valve: The aortic valve is tricuspid. Aortic valve regurgitation is not visualized. No aortic stenosis is present. Pulmonic Valve: The pulmonic valve was grossly normal. Pulmonic valve regurgitation is not visualized. Aorta: The aortic root and ascending aorta are structurally normal, with no evidence of dilitation. IAS/Shunts: No atrial level shunt detected by color flow Doppler. [name] MD Electronically signed by [name] MD Signature Date/Time: 06/10/2020/3:13:30 PM    Final    Telemetry    06/11/20 NSR- Personally Reviewed  ECG    No new tracing as of 06/11/20- Personally Reviewed  Cardiac Studies   2D Echo 05/31/20 1. Akinesis of the mid septum into the apex consistent with LAD  infarction. Contrast imaging shows no evidence of LV thrombus. Left  ventricular ejection fraction, by estimation, is 35 to  40%. The left  ventricle has moderately decreased function. The  left ventricle demonstrates regional wall motion abnormalities (see  scoring diagram/findings for description). Left ventricular diastolic  parameters are consistent with Grade II diastolic dysfunction  (pseudonormalization). Elevated left atrial pressure.  2. Right ventricular systolic function is normal. The right ventricular  size is normal. There is normal pulmonary artery systolic pressure. The  estimated right ventricular systolic pressure is 19.3 mmHg.  3. The mitral valve is grossly normal. Trivial mitral valve  regurgitation. No evidence of mitral stenosis.  4. The aortic valve is tricuspid.  Aortic valve regurgitation is not  visualized. Mild aortic valve sclerosis is present, with no evidence of  aortic valve stenosis.  5. The inferior vena cava is normal in size with greater than 50%  respiratory variability, suggesting right atrial pressure of 3 mmHg.   Conclusion(s)/Recommendation(s): Findings consistent with ischemic  cardiomyopathy.   LHC 05/30/20  3rd Mrg lesion is 50% stenosed.  LPDA lesion is 70% stenosed.  LPAV-1 lesion is 30% stenosed.  LPAV-2 lesion is 30% stenosed.  1st Diag lesion is 70% stenosed.  Ramus lesion is 20% stenosed.  Mid LAD lesion is 100% stenosed.  Post intervention, there is a 0% residual stenosis.  A stent was successfully placed.  Acute anterolateral ST segment elevation myocardial infarction secondary to total occlusion of the mid LAD with TIMI 0 flow and absence of collaterals.  Concomitant CAD with tortuous 1st diagonal vessel with 70% narrowing beyond a sharp bend in the vessel, 20% narrowing beyond a sharp bend in the ramus immediate vessel, and dominant left circumflex coronary artery with 50% focal OM 3 stenosis, distal 30% stenoses in the AV groove, and focal 70% stenosis in the midportion of a small PLA branch. Normal small nondominant RCA.  LVEDP 12 millimeters Hg.   Successful PCI to the totally occluded mid LAD with ultimate insertion of a Resolute Onyx 3.0 x 22 mm DES stent postdilated to 3.25 mm with digital narrowing of 0% and restoration of TIMI-3 flow.  RECOMMENDATION: DAPT for minimum of 1 year. Aggressive lipid-lowering therapy with titration of atorvastatin to 80 mg daily. We will reinitiate ARB therapy with losartan initially at lower dose, add low-dose beta-blocker therapy, and obtain 2D echo Doppler study in a.m. optimal blood pressure control with target blood pressure less than 130/80 and ideal blood pressure less than 120/80. We will diabetes control. If patient has LV dysfunction, SGLT 2 inhibition  can be considered.  TEE report pending - no LAA thrombus  Patient Profile     49 y.o. male withrecent anterior STEMI found tohaveanoccluded LAD which was successfully stented with DES/PCI placement (with residual50% stenosis in OM and 70% stenosis in left PDA),DM2, HLD, HTN, ICM EF 35-40%, chronic cough (was pending outpatient cardiology OV), sinus issues for 5-6 months. During admission he was noted to have low grade fever felt possibly reactive to post MI. Blood cultures andUAwere negative during last admission. He was discharged on 06/01/20. [facility] and was round to be in rapid atrial flutter so was sent to the hospital for further management. Covid test negative.  Assessment & Plan    1. New onset atrialflutterwith RVR, unclear onset (sometime between discharge 8/10 and infusion visit 8/17): -Now s/p TEE-DCCV 06/10/20 with restoration of NSR>>maintaining  -Carvedilol transitioned to metoprolol for HR control with less BP effect and better beta-1 selectivity -Tolerating Eliquis with no s/s of blood in urine or stool -CHADSVASC 4  2. CAD with recent acute anterior STEMIs/p DES to occluded LAD -Stable with no recurrent anginal symptoms, troponin downtrending -Previously on PTA DAPT withASA and Brilinta which was transitioned to Plavix given need for [facility] with atrial flutter -Plan to continue with Plavix and Eliquis only  3. Ischemic cardiomyopathy: -Echocardiogram performed last admission withLVEFat35-40% -Will restart low dose Entresto>>monitor for hypotension  -CXROK, appears euvolemic -Creatinine stable at 1.07  4. Fever of unknown origin, recent chronic cough/sinus issues -Blood Cx and UA negative last admission -Covidnegative -Continues with low grade temp for unclear reasons>>IM consulted>>now with repeat UA and blood Cx  -Apreciate their assistance   5. Anemia, normocytic -Hgb 13.6 on admission,  12.0>>>10.8>>10.2>>> previously in the 13-15 range last admission -No reports recurrent streaky BRBPR with straining. Denies abdominal pain  -Continue PPI and trend Hgb  6. Uncontrolled DM2: A1c 9.8 on last admission -PTA Lantus and Farxiga  7.Hyperlipidemia: -LDL, 182 on last admission with goal LDL <70 -Continuehigh dose lipitor 80 mg daily  8. Morbid obesitywith OSA -Recent dx of sleep apnea by [facility] is awaiting starting CPAP   Signed, Georgie Chard NP-C [facility] Pager: [phone] 06/11/2020, 9:10 AM    For questions or updates, please contact   Please consult [email or web address] for contact info under Cardiology/STEMI.  Patient seen, examined. Available data reviewed. Agree with findings, assessment, and plan as outlined by Georgie Chard, NP-C.  The patient is alert, oriented, in no distress.  Lungs are clear, heart is regular rate and rhythm with no murmur gallop, abdomen soft nontender, extremities have no edema, skin is warm and dry without rash.  Telemetry is reviewed and he is maintaining sinus rhythm.  I have reviewed his current medical program and agree with reinitiation of Entresto.  He has plenty of blood pressure room to tolerate this.  Other medicines will be continued without change.  Will arrange outpatient cardiology follow-up.  Appreciate evaluation of the hospitalist team by Dr. Ophelia Charter.  CT chest, abdomen, and pelvis reviewed with no infectious etiology identified.  Urinalysis shows no signs of infection.  The patient will be given an incentive spirometer to use at discharge and I discussed this with him and his wife today.  Tonny Bollman, M.D. 06/11/2020 12:32 PM

## 2020-06-11 NOTE — TOC Benefit Eligibility Note (Signed)
Transition of Care [facility]) Benefit Eligibility Note    Patient Details  Name: [name] Merritt MRN: [id number] Date of Birth: [birth year]   Medication/Dose: Arne Cleveland  5 MG BID  Covered?: Yes  Tier: 2 Drug  Prescription Coverage Preferred Pharmacy: [facility] with Person/Company/Phone Number:: JACKIE  @ [employer] # [phone]  Co-Pay: $35.00  Prior Approval: No  Deductible: Met       Memory Argue Phone Number: 06/11/2020, 3:19 PM

## 2020-06-11 NOTE — Discharge Summary (Addendum)
Discharge Summary    Patient ID: [name] Merritt MRN: [id number]; DOB: July 16, 1971  Admit date: 06/08/2020 Discharge date: 06/11/2020  Primary Care Provider: Renford Dills, MD Primary Cardiologist: Nicki Guadalajara, MD   Discharge Diagnoses    Principal Problem:   Atrial flutter [facility]) Active Problems:   Hypertension   Hyperlipidemia with target LDL less than 70   DM type 2, goal HbA1c < 7% (HCC)   OSA (obstructive sleep apnea)   Morbid obesity (HCC)   Cough  Diagnostic Studies/Procedures    TEE/DCCV 06/10/20:  1. Left ventricular ejection fraction, by estimation, is 40 to 45%. The  left ventricle has mildly decreased function. The left ventricle  demonstrates regional wall motion abnormalities. Apical hypokinesis.  2. Right ventricular systolic function is normal. The right ventricular  size is normal.  3. The mitral valve is normal in structure. Trivial mitral valve  regurgitation.  4. The aortic valve is tricuspid. Aortic valve regurgitation is not  visualized. No aortic stenosis is present.  5. No left atrial/left atrial appendage thrombus was detected.   History of Present Illness     [name] Merritt is a 49 y.o. male with with a hx of recent anterior STEMI found to have an occluded LAD which was successfully stented with DES/PCI placement (with residual 50% stenosis in OM and 70% stenosis in left PDA). Also with a hx of DM2, HLD, and HTN who was admitted for the evaluation of  new onset atrial flutter.  [name] Merritt is a 49yo M with a hx as stated above who presented from the cardiac infusion clinic after being found to be tachycardic on telemetry. EKG performed at that time that showed atrial flutter with RVR with a rate at 158bpm. Patient and wife reported that since hospital discharge on 06/01/2020 he had persistent low-grade fevers, has had dyspneia on exertion and has been rather fatigued. Patient reported chronic propductive cough that worsened with lying flat  and in the heat. He stateed that his cough had been present for at least one year. He had an appointment with pulmonary medicine but has unfortunately missed this due to his hospitalization.This seemed to be his most bothersome symptom at this time. He denies chest pain, LE edema, orthopnea, dizziness, palpitations, or syncope.   In the ED, EKG showed atrial flutter with a rate of 158 bpm. WBC remained elevated at 11.3. Temp on ED arrival at 99.3. Previously admission with leukocystosis and fever as well, felt to be secondary to cardiac infarct. Blood and urine cultures were negative. Plans were for OTC tylenol and cough suppressant which had not helped. Creatinine stable at 1.17. HS Troponin elevated at 252>>>likely down trending from peak HST 05/30/20 at [id number].   Hospital Course    New onset atrial flutter with RVR: -Pt presented to [facility] from cardiac infusion clinic after being found to be tachycardic.  EKG performed showed atrial flutter with RVR with a rate at 158 bpm. He has remained asymptomatic with no palpitations or chest pain.  -Repeat EKG with atrial flutter with elevated rates therefore patient placed on diltiazem infusion per EDP>>would plan to transisiton off diltiazem infusion due to ischemic caqrdiomyopathy with LVEF at 35 to 40% -On PTA carvedilol at 6.25mg . He was transitioned to metoprolol succinate for less BP effect and better beta-1 selectivity -BPs were initially soft therefore Entresto was held for two days and restated on day of discharge at 24/26 dose  -Eliquis 5mg  BID was started and he has tolerated well -CHA2DS2VASc= 4 (HTN, CAD,  DM2, CHF) -Brilinta was stopped and transitioned to Plavix (loaded with 300mg  Plavix and started on 75mg ) -TEE/DCCV performed 06/10/20 with successful conversion to NSR   CAD with recent acute anterior STEMI: -Recent anterior STEMI found to have occluded LAD which was successfully stented with DES/PCI. Also has known residual 50% stenosis in OM and  70% stenosis in left PDA with plans to treat medically -Denies recurrent anginal symptoms  -On PTA DAPT with ASA and Brilinta>>>transitioned off Brilinta and loaded with 300mg  Plavix>>then 75mg  PO QD   Ischemic cardiomyopathy: -Echocardiogram performed last admission with LVEF at 35-40%>>placed on Entresto 24/26 with plans to up-titrate in there OP setting  -Farxiga also added for both CHF and DM indications  Diabetes Type II, uncontrolled with hyperglycemia: -Hb A1c, 9.8 on last admission -DM coordinator consulted last admission with plans for Lantus to 55 units qhs and Farxiga. -Needs close follow up with Dr  Hyperlipidemia: -LDL, 182 on last admission with goal LDL <70 -Continue high dose lipitor 80 mg daily -Recheck lipid panel at next OV   Hypotension: -Improved, 138/85>124/86>134/94 -Entresto restarted on day of discharge>>tolerated well  -Continue Toprol XL  Morbid obesitywith OSA/chronic cough: -Recent dx of sleep apnea by [place] [facility] and is awaiting starting CPAP -Wonder if there is some asthma component to his chronic cough  -May benefit from corticosteroid along with his albuterol  -Needs lifestyle modification   Low grade fevers and cough -IM consulted this admission with repeat blood cultures with pending results  -Negative blood cx and UA last admission -Covid negative  -Feeling better this AM -Per d/w Dr. 06-03-1984, continue to monitor>>anticipate OP f/u pulm  Chronic systolic heart failure -Pt with reduced LV function, LVEF 40-45% -No sign of clinical decompensation during his hospitalization -Beta blocker changed to metoprolol succinate to help with rate control  Consultants: None   The patient was seen and examined by Dr. 10-27-2002 who feel that he is stable and ready for discharge today, 06/11/20.   Did the patient have an acute coronary syndrome (MI, NSTEMI, STEMI, etc) this admission?:  No                               Did the patient  have a percutaneous coronary intervention (stent / angioplasty)?:  No.   _____________  Discharge Vitals Blood pressure 138/85, pulse 79, temperature 98.2 F (36.8 C), temperature source Oral, resp. rate 17, height 6\' 2"  (1.88 m), weight 135.1 kg, SpO2 99 %.  Filed Weights   06/08/20 1158 06/10/20 0446 06/11/20 0522  Weight: 135.2 kg 134.8 kg 135.1 kg    Labs & Radiologic Studies    CBC Recent Labs    06/10/20 0414 06/11/20 0508  WBC 10.9* 8.4  HGB 10.8* 10.2*  HCT 33.8* 32.6*  MCV 94.9 96.2  PLT 554* 529*   Basic Metabolic Panel Recent Labs    06/10/20 0414 06/11/20 0508  NA 134* 135  K 4.2 4.3  CL 101 102  CO2 23 23  GLUCOSE 171* 180*  BUN 16 15  CREATININE 1.02 1.07  CALCIUM 8.8* 8.6*   Liver Function Tests No results for input(s): AST, ALT, ALKPHOS, BILITOT, PROT, ALBUMIN in the last 72 hours. No results for input(s): LIPASE, AMYLASE in the last 72 hours. High Sensitivity Troponin:   Recent Labs  Lab 05/30/20 0952 06/08/20 1222 06/08/20 1530  TROPONINIHS 7,578* 252* 241*    BNP Invalid input(s): POCBNP D-Dimer No results  for input(s): DDIMER in the last 72 hours. Hemoglobin A1C No results for input(s): HGBA1C in the last 72 hours. Fasting Lipid Panel No results for input(s): CHOL, HDL, LDLCALC, TRIG, CHOLHDL, LDLDIRECT in the last 72 hours. Thyroid Function Tests Recent Labs    06/09/20 1143  TSH 1.172   _____________  CT CHEST W CONTRAST  Result Date: 06/10/2020 CLINICAL DATA:  Persistent cough, atrial flutter, leukocytosis EXAM: CT CHEST, ABDOMEN, AND PELVIS WITH CONTRAST TECHNIQUE: Multidetector CT imaging of the chest, abdomen and pelvis was performed following the standard protocol during bolus administration of intravenous contrast. CONTRAST:  OMNIPAQUE IOHEXOL 300 MG/ML  SOLN COMPARISON:  06/08/2020, 01/01/2020 FINDINGS: CT CHEST FINDINGS Cardiovascular: The heart and great vessels are unremarkable without pericardial effusion. Stent  is identified within the LAD distribution. Normal caliber of the thoracic aorta. Mediastinum/Nodes: No enlarged mediastinal, hilar, or axillary lymph nodes. Thyroid gland, trachea, and esophagus demonstrate no significant findings. Lungs/Pleura: Scattered areas of atelectasis are seen at the lung bases. No airspace disease, effusion, or pneumothorax. Elevated right hemidiaphragm. Musculoskeletal: No acute or destructive bony lesions. Bilateral gynecomastia again noted. Reconstructed images demonstrate no additional findings. CT ABDOMEN PELVIS FINDINGS Hepatobiliary: Heterogeneous decreased attenuation of the liver consistent with hepatic steatosis. The gallbladder is unremarkable. No biliary dilation. Pancreas: Unremarkable. No pancreatic ductal dilatation or surrounding inflammatory changes. Spleen: Normal in size without focal abnormality. Adrenals/Urinary Tract: Adrenal glands are unremarkable. Kidneys are normal, without renal calculi, focal lesion, or hydronephrosis. Bladder is unremarkable. Stomach/Bowel: No bowel obstruction or ileus. Normal appendix right lower quadrant. No bowel wall thickening or inflammatory change. Minimal sigmoid diverticulosis without diverticulitis. Vascular/Lymphatic: No significant vascular findings are present. No enlarged abdominal or pelvic lymph nodes. The Reproductive: Prostate is unremarkable. Other: No free fluid or free gas. No abdominal wall hernia. Musculoskeletal: No acute or destructive bony lesions. Reconstructed images demonstrate no additional findings. IMPRESSION: 1. No acute intrathoracic, intra-abdominal, or intrapelvic process. 2. Hepatic steatosis. 3. Minimal sigmoid diverticulosis without diverticulitis. 4. Stable gynecomastia. Electronically Signed   By: Sharlet Salina M.D.   On: 06/10/2020 20:04   CT ABDOMEN PELVIS W CONTRAST  Result Date: 06/10/2020 CLINICAL DATA:  Persistent cough, atrial flutter, leukocytosis EXAM: CT CHEST, ABDOMEN, AND PELVIS WITH  CONTRAST TECHNIQUE: Multidetector CT imaging of the chest, abdomen and pelvis was performed following the standard protocol during bolus administration of intravenous contrast. CONTRAST:  OMNIPAQUE IOHEXOL 300 MG/ML  SOLN COMPARISON:  06/08/2020, 01/01/2020 FINDINGS: CT CHEST FINDINGS Cardiovascular: The heart and great vessels are unremarkable without pericardial effusion. Stent is identified within the LAD distribution. Normal caliber of the thoracic aorta. Mediastinum/Nodes: No enlarged mediastinal, hilar, or axillary lymph nodes. Thyroid gland, trachea, and esophagus demonstrate no significant findings. Lungs/Pleura: Scattered areas of atelectasis are seen at the lung bases. No airspace disease, effusion, or pneumothorax. Elevated right hemidiaphragm. Musculoskeletal: No acute or destructive bony lesions. Bilateral gynecomastia again noted. Reconstructed images demonstrate no additional findings. CT ABDOMEN PELVIS FINDINGS Hepatobiliary: Heterogeneous decreased attenuation of the liver consistent with hepatic steatosis. The gallbladder is unremarkable. No biliary dilation. Pancreas: Unremarkable. No pancreatic ductal dilatation or surrounding inflammatory changes. Spleen: Normal in size without focal abnormality. Adrenals/Urinary Tract: Adrenal glands are unremarkable. Kidneys are normal, without renal calculi, focal lesion, or hydronephrosis. Bladder is unremarkable. Stomach/Bowel: No bowel obstruction or ileus. Normal appendix right lower quadrant. No bowel wall thickening or inflammatory change. Minimal sigmoid diverticulosis without diverticulitis. Vascular/Lymphatic: No significant vascular findings are present. No enlarged abdominal or pelvic lymph nodes. The Reproductive:  Prostate is unremarkable. Other: No free fluid or free gas. No abdominal wall hernia. Musculoskeletal: No acute or destructive bony lesions. Reconstructed images demonstrate no additional findings. IMPRESSION: 1. No acute  intrathoracic, intra-abdominal, or intrapelvic process. 2. Hepatic steatosis. 3. Minimal sigmoid diverticulosis without diverticulitis. 4. Stable gynecomastia. Electronically Signed   By: Sharlet Salina M.D.   On: 06/10/2020 20:04   CARDIAC CATHETERIZATION  Addendum Date: 06/03/2020    3rd Mrg lesion is 50% stenosed.  LPDA lesion is 70% stenosed.  LPAV-1 lesion is 30% stenosed.  LPAV-2 lesion is 30% stenosed.  1st Diag lesion is 70% stenosed.  Ramus lesion is 20% stenosed.  Mid LAD lesion is 100% stenosed.  Post intervention, there is a 0% residual stenosis.  A stent was successfully placed.  Acute anterolateral ST segment elevation myocardial infarction secondary to total occlusion of the mid LAD with TIMI 0 flow and absence of collaterals. Concomitant CAD with tortuous 1st diagonal vessel with 70% narrowing beyond a sharp bend in the vessel, 20% narrowing beyond a sharp bend in the ramus immediate vessel, and dominant left circumflex coronary artery with 50% focal OM 3 stenosis, distal 30% stenoses in the AV groove, and focal 70% stenosis in the midportion of a small PLA branch.  Normal small nondominant RCA. LVEDP 12 millimeters Hg. Successful PCI to the totally occluded mid LAD with ultimate insertion of a Resolute Onyx 3.0 x 22 mm DES stent postdilated to 3.25 mm with digital narrowing of 0% and restoration of TIMI-3 flow. RECOMMENDATION: DAPT for minimum of 1 year.  Aggressive lipid-lowering therapy with titration of atorvastatin to 80 mg daily.  We will reinitiate ARB therapy with losartan initially at lower dose, add low-dose beta-blocker therapy, and obtain 2D echo Doppler study in a.m. optimal blood pressure control with target blood pressure less than 130/80 and ideal blood pressure less than 120/80.  We will diabetes control.  If patient has LV dysfunction, SGLT 2 inhibition can be considered.   Result Date: 05/30/2020  3rd Mrg lesion is 50% stenosed.  LPDA lesion is 70% stenosed.   LPAV-1 lesion is 30% stenosed.  LPAV-2 lesion is 30% stenosed.  1st Diag lesion is 70% stenosed.  Ramus lesion is 20% stenosed.  Mid LAD lesion is 100% stenosed.  Post intervention, there is a 0% residual stenosis.  A stent was successfully placed.  Acute anterolateral ST segment elevation myocardial infarction secondary to total occlusion of the mid LAD with TIMI 0 flow and absence of collaterals. Concomitant CAD with tortuous 1st diagonal vessel with 70% narrowing beyond a sharp bend in the vessel, 20% narrowing beyond a sharp bend in the ramus immediate vessel, and dominant left circumflex coronary artery with 50% focal OM 3 stenosis, distal 30% stenoses in the AV groove, and focal 70% stenosis in the midportion of a small PLA branch.  Normal small nondominant RCA. LVEDP 12 millimeters Hg. Successful PCI to the totally occluded mid LAD with ultimate insertion of a Resolute Onyx 3.0 x 22 mm DES stent postdilated to 3.25 mm with digital narrowing of 0% and restoration of TIMI-3 flow. RECOMMENDATION: DAPT for minimum of 1 year.  Aggressive lipid-lowering therapy with titration of atorvastatin to 80 mg daily.  We will reinitiate ARB therapy with losartan initially at lower dose, add low-dose beta-blocker therapy, and obtain 2D echo Doppler study in a.m. optimal blood pressure control with target blood pressure less than 130/80 and ideal blood pressure less than 120/80.  We will diabetes control.  If patient  has LV dysfunction, SGLT 2 inhibition can be considered.   DG Chest Portable 1 View  Result Date: 06/08/2020 CLINICAL DATA:  Tachycardia EXAM: PORTABLE CHEST 1 VIEW COMPARISON:  05/30/2020 FINDINGS: The heart size and mediastinal contours are within normal limits. Chronic elevation of the right hemidiaphragm with right basilar atelectasis. Left lung is clear. No pleural effusion or pneumothorax. The visualized skeletal structures are unremarkable. IMPRESSION: Chronic elevation of the right hemidiaphragm  with right basilar atelectasis. No acute cardiopulmonary process. Electronically Signed   By: Duanne Guess D.O.   On: 06/08/2020 12:40   DG Chest Port 1 View  Result Date: 05/30/2020 CLINICAL DATA:  Chest pain, fever EXAM: PORTABLE CHEST 1 VIEW COMPARISON:  12/31/2019 FINDINGS: The heart size and mediastinal contours are within normal limits. Unchanged elevation of the right hemidiaphragm. No acute airspace opacity. The visualized skeletal structures are unremarkable. IMPRESSION: No acute abnormality of the lungs in portable projection. Unchanged elevation of the right hemidiaphragm. No acute airspace opacity. Electronically Signed   By: Lauralyn Primes M.D.   On: 05/30/2020 11:52   ECHOCARDIOGRAM COMPLETE  Result Date: 05/31/2020    ECHOCARDIOGRAM REPORT   Patient Name:   [name] Merritt Date of Exam: 05/31/2020 Medical Rec #:  [id number]        Height:       74.0 in Accession #:    [id number]       Weight:       310.0 lb Date of Birth:  1971/01/16        BSA:          2.618 m Patient Age:    49 years         BP:           104/77 mmHg Patient Gender: M                HR:           78 bpm. Exam Location:  Inpatient Procedure: 2D Echo, Cardiac Doppler, Color Doppler and Intracardiac            Opacification Agent Indications:    CAD Native Vessel 414.01 / I25.10  History:        Patient has no prior history of Echocardiogram examinations.                 Risk Factors:Hypertension, Diabetes, Dyslipidemia and                 Non-Smoker.  Sonographer:    Renella Cunas RDCS Referring Phys: [id number] PETER M [place] IMPRESSIONS  1. Akinesis of the mid septum into the apex consistent with LAD infarction. Contrast imaging shows no evidence of LV thrombus. Left ventricular ejection fraction, by estimation, is 35 to 40%. The left ventricle has moderately decreased function. The left ventricle demonstrates regional wall motion abnormalities (see scoring diagram/findings for description). Left ventricular diastolic parameters are  consistent with Grade II diastolic dysfunction (pseudonormalization). Elevated left atrial pressure.  2. Right ventricular systolic function is normal. The right ventricular size is normal. There is normal pulmonary artery systolic pressure. The estimated right ventricular systolic pressure is 19.3 mmHg.  3. The mitral valve is grossly normal. Trivial mitral valve regurgitation. No evidence of mitral stenosis.  4. The aortic valve is tricuspid. Aortic valve regurgitation is not visualized. Mild aortic valve sclerosis is present, with no evidence of aortic valve stenosis.  5. The inferior vena cava is normal in size with greater than 50% respiratory variability, suggesting right  atrial pressure of 3 mmHg. Conclusion(s)/Recommendation(s): Findings consistent with ischemic cardiomyopathy. FINDINGS  Left Ventricle: Akinesis of the mid septum into the apex consistent with LAD infarction. Contrast imaging shows no evidence of LV thrombus. Left ventricular ejection fraction, by estimation, is 35 to 40%. The left ventricle has moderately decreased function. The left ventricle demonstrates regional wall motion abnormalities. Definity contrast agent was given IV to delineate the left ventricular endocardial borders. The left ventricular internal cavity size was normal in size. There is no left ventricular hypertrophy. Left ventricular diastolic parameters are consistent with Grade II diastolic dysfunction (pseudonormalization). Elevated left atrial pressure.  LV Wall Scoring: The mid and distal anterior septum, apical anterior segment, and apex are akinetic. Right Ventricle: The right ventricular size is normal. No increase in right ventricular wall thickness. Right ventricular systolic function is normal. There is normal pulmonary artery systolic pressure. The tricuspid regurgitant velocity is 2.02 m/s, and  with an assumed right atrial pressure of 3 mmHg, the estimated right ventricular systolic pressure is 19.3 mmHg. Left  Atrium: Left atrial size was normal in size. Right Atrium: Right atrial size was normal in size. Pericardium: Trivial pericardial effusion is present. Presence of pericardial fat pad. Mitral Valve: The mitral valve is grossly normal. Trivial mitral valve regurgitation. No evidence of mitral valve stenosis. Tricuspid Valve: The tricuspid valve is grossly normal. Tricuspid valve regurgitation is trivial. No evidence of tricuspid stenosis. Aortic Valve: The aortic valve is tricuspid. . There is mild thickening and mild calcification of the aortic valve. Aortic valve regurgitation is not visualized. Mild aortic valve sclerosis is present, with no evidence of aortic valve stenosis. There is mild thickening of the aortic valve. There is mild calcification of the aortic valve. Pulmonic Valve: The pulmonic valve was grossly normal. Pulmonic valve regurgitation is not visualized. No evidence of pulmonic stenosis. Aorta: The aortic root is normal in size and structure. Venous: The inferior vena cava is normal in size with greater than 50% respiratory variability, suggesting right atrial pressure of 3 mmHg. IAS/Shunts: The atrial septum is grossly normal.  LEFT VENTRICLE PLAX 2D LVIDd:         4.00 cm     Diastology LVIDs:         3.10 cm     LV e' lateral:   7.89 cm/s LV PW:         0.80 cm     LV E/e' lateral: 13.2 LV IVS:        0.80 cm     LV e' medial:    5.70 cm/s LVOT diam:     2.30 cm     LV E/e' medial:  18.2 LV SV:         69 LV SV Index:   26 LVOT Area:     4.15 cm  LV Volumes (MOD) LV vol d, MOD A4C: 95.6 ml LV vol s, MOD A4C: 53.9 ml LV SV MOD A4C:     95.6 ml RIGHT VENTRICLE RV S prime:     11.10 cm/s TAPSE (M-mode): 1.7 cm LEFT ATRIUM             Index       RIGHT ATRIUM           Index LA diam:        3.50 cm 1.34 cm/m  RA Area:     10.60 cm LA Vol (A2C):   33.0 ml 12.60 ml/m RA Volume:   23.30 ml  8.90 ml/m  LA Vol (A4C):   20.9 ml 7.98 ml/m LA Biplane Vol: 27.7 ml 10.58 ml/m  AORTIC VALVE LVOT Vmax:    99.90 cm/s LVOT Vmean:  70.700 cm/s LVOT VTI:    0.165 m  AORTA Ao Root diam: 3.30 cm MITRAL VALVE                TRICUSPID VALVE MV Area (PHT): 5.27 cm     TR Peak grad:   16.3 mmHg MV Decel Time: 144 msec     TR Vmax:        202.00 cm/s MV E velocity: 104.00 cm/s MV A velocity: 73.70 cm/s   SHUNTS MV E/A ratio:  1.41         Systemic VTI:  0.16 m                             Systemic Diam: 2.30 cm Lennie Odor MD Electronically signed by Lennie Odor MD Signature Date/Time: 05/31/2020/5:21:43 PM    Final    ECHO TEE  Result Date: 06/10/2020    TRANSESOPHOGEAL ECHO REPORT   Patient Name:   [name] Merritt Date of Exam: 06/10/2020 Medical Rec #:  [id number]        Height:       74.0 in Accession #:    [id number]       Weight:       297.1 lb Date of Birth:  1971-06-19        BSA:          2.572 m Patient Age:    49 years         BP:           125/96 mmHg Patient Gender: M                HR:           190 bpm. Exam Location:  Inpatient Procedure: Transesophageal Echo Indications:     atrial flutter  History:         Patient has prior history of Echocardiogram examinations, most                  recent 05/31/2020. Cardiomegaly, Arrythmias:Atrial Flutter; Risk                  Factors:Diabetes, Hypertension, Dyslipidemia and Sleep Apnea.  Sonographer:     Delcie Roch Referring Phys:  [id number] AOZHY Q DUNN Diagnosing Phys: [name] MD PROCEDURE: After discussion of the risks and benefits of a TEE, an informed consent was obtained from the patient. The transesophogeal probe was passed without difficulty through the esophogus of the patient. Local oropharyngeal anesthetic was provided with Cetacaine. Sedation performed by different physician. The patient was monitored while under deep sedation. Anesthestetic sedation was provided intravenously by Anesthesiology:  of Propofol,  of Lidocaine. The patient developed no complications during the procedure. A direct current cardioversion was performed.  IMPRESSIONS  1. Left ventricular ejection fraction, by estimation, is 40 to 45%. The left ventricle has mildly decreased function. The left ventricle demonstrates regional wall motion abnormalities. Apical hypokinesis.  2. Right ventricular systolic function is normal. The right ventricular size is normal.  3. The mitral valve is normal in structure. Trivial mitral valve regurgitation.  4. The aortic valve is tricuspid. Aortic valve regurgitation is not visualized. No aortic stenosis is present.  5. No left atrial/left atrial appendage thrombus was detected. FINDINGS  Left Ventricle: Left  ventricular ejection fraction, by estimation, is 40 to 45%. The left ventricle has mildly decreased function. The left ventricle demonstrates regional wall motion abnormalities. The left ventricular internal cavity size was normal in size. There is no left ventricular hypertrophy. Right Ventricle: The right ventricular size is normal. Right vetricular wall thickness was not assessed. Right ventricular systolic function is normal. Left Atrium: Left atrial size was normal in size. No left atrial/left atrial appendage thrombus was detected. Right Atrium: Right atrial size was normal in size. Pericardium: There is no evidence of pericardial effusion. Mitral Valve: The mitral valve is normal in structure. Trivial mitral valve regurgitation. Tricuspid Valve: The tricuspid valve is normal in structure. Tricuspid valve regurgitation is trivial. Aortic Valve: The aortic valve is tricuspid. Aortic valve regurgitation is not visualized. No aortic stenosis is present. Pulmonic Valve: The pulmonic valve was grossly normal. Pulmonic valve regurgitation is not visualized. Aorta: The aortic root and ascending aorta are structurally normal, with no evidence of dilitation. IAS/Shunts: No atrial level shunt detected by color flow Doppler. [name] MD Electronically signed by [name] MD Signature Date/Time: 06/10/2020/3:13:30  PM    Final    Disposition   Pt is being discharged home today in good condition.  Follow-up Plans & Appointments    Follow-up Information    Renford Dills, MD.   Specialty: Internal Medicine Contact information: [address] [place] [address] [phone]        Azalee Course, [place] Follow up on 07/01/2020.   Specialties: Cardiology, Radiology Why: at 8:15am Contact information: [address] [place] [address] [phone]              Discharge Instructions    Call MD for:  difficulty breathing, headache or visual disturbances   Complete by: As directed    Call MD for:  extreme fatigue   Complete by: As directed    Call MD for:  hives   Complete by: As directed    Call MD for:  persistant dizziness or light-headedness   Complete by: As directed    Call MD for:  persistant nausea and vomiting   Complete by: As directed    Call MD for:  redness, tenderness, or signs of infection (pain, swelling, redness, odor or green/yellow discharge around incision site)   Complete by: As directed    Call MD for:  severe uncontrolled pain   Complete by: As directed    Call MD for:  temperature >100.4   Complete by: As directed    Diet - low sodium heart healthy   Complete by: As directed    Increase activity slowly   Complete by: As directed      Discharge Medications   Allergies as of 06/11/2020      Reactions   Pineapple Itching, Other (See Comments)   All-over itching (mouth, too)   Tomato Itching, Other (See Comments)   All-over itching (mouth, too) Ketchup, also   Rosuvastatin Calcium Hives, Rash      Medication List    STOP taking these medications   carvedilol 6.25 MG tablet Commonly known as: COREG   ticagrelor 90 MG Tabs tablet Commonly known as: BRILINTA     TAKE these medications   acetaminophen 650 MG CR tablet Commonly known as: TYLENOL Take 1,300 mg by mouth every 8 (eight) hours as needed for pain.     apixaban 5 MG Tabs tablet Commonly known as: ELIQUIS Take 1 tablet (5 mg total)  by mouth 2 (two) times daily.   aspirin 81 MG chewable tablet Chew 1 tablet (81 mg total) by mouth daily.   atorvastatin 80 MG tablet Commonly known as: LIPITOR Take 1 tablet (80 mg total) by mouth daily at 6 PM.   cetirizine 10 MG tablet Commonly known as: ZYRTEC Take 1 tablet (10 mg total) by mouth daily.   clopidogrel 75 MG tablet Commonly known as: PLAVIX Take 1 tablet (75 mg total) by mouth daily. Start taking on: June 12, 2020   dapagliflozin propanediol 5 MG Tabs tablet Commonly known as: FARXIGA Take 1 tablet (5 mg total) by mouth daily.   fluticasone 50 MCG/ACT nasal spray Commonly known as: FLONASE SHAKE LIQUID AND USE 1 SPRAY IN EACH NOSTRIL DAILY What changed: See the new instructions.   Lantus SoloStar 100 UNIT/ML Solostar Pen Generic drug: insulin glargine Inject 55 Units into the skin daily after breakfast.   magnesium citrate Soln Take 1 Bottle by mouth once as needed for mild constipation or moderate constipation.   metoprolol succinate 25 MG 24 hr tablet Commonly known as: TOPROL-XL Take 1 tablet (25 mg total) by mouth daily. Start taking on: June 12, 2020   nitroGLYCERIN 0.4 MG SL tablet Commonly known as: NITROSTAT Place 1 tablet (0.4 mg total) under the tongue every 5 (five) minutes as needed for chest pain.   pantoprazole 40 MG tablet Commonly known as: PROTONIX Take 40 mg by mouth 2 (two) times daily.   sacubitril-valsartan 24-26 MG Commonly known as: ENTRESTO Take 1 tablet by mouth 2 (two) times daily.   Ventolin HFA 108 (90 Base) MCG/ACT inhaler Generic drug: albuterol Inhale 2 puffs into the lungs every 6 (six) hours as needed for wheezing or shortness of breath.       Outstanding Labs/Studies   CBC, BMET   Duration of Discharge Encounter   Greater than 30 minutes including physician time.  Signed, Georgie Chard, NP 06/11/2020, 1:28  PM

## 2020-06-11 NOTE — Plan of Care (Signed)
  Problem: Education: Goal: Knowledge of General Education information will improve Description: Including pain rating scale, medication(s)/side effects and non-pharmacologic comfort measures Outcome: Adequate for Discharge   Problem: Education: Goal: Knowledge of disease or condition will improve Outcome: Adequate for Discharge Goal: Understanding of medication regimen will improve Outcome: Adequate for Discharge Goal: Individualized Educational Video(s) Outcome: Adequate for Discharge   Problem: Activity: Goal: Ability to tolerate increased activity will improve Outcome: Adequate for Discharge   Problem: Cardiac: Goal: Ability to achieve and maintain adequate cardiopulmonary perfusion will improve Outcome: Adequate for Discharge   Problem: Health Behavior/Discharge Planning: Goal: Ability to safely manage health-related needs after discharge will improve Outcome: Adequate for Discharge   Problem: Education: Goal: Knowledge of General Education information will improve Description: Including pain rating scale, medication(s)/side effects and non-pharmacologic comfort measures Outcome: Adequate for Discharge   Problem: Health Behavior/Discharge Planning: Goal: Ability to manage health-related needs will improve Outcome: Adequate for Discharge   Problem: Clinical Measurements: Goal: Ability to maintain clinical measurements within normal limits will improve Outcome: Adequate for Discharge Goal: Will remain free from infection Outcome: Adequate for Discharge Goal: Diagnostic test results will improve Outcome: Adequate for Discharge Goal: Respiratory complications will improve Outcome: Adequate for Discharge Goal: Cardiovascular complication will be avoided Outcome: Adequate for Discharge   Problem: Nutrition: Goal: Adequate nutrition will be maintained Outcome: Adequate for Discharge   Problem: Coping: Goal: Level of anxiety will decrease Outcome: Adequate for  Discharge   Problem: Elimination: Goal: Will not experience complications related to bowel motility Outcome: Adequate for Discharge Goal: Will not experience complications related to urinary retention Outcome: Adequate for Discharge   Problem: Pain Managment: Goal: General experience of comfort will improve Outcome: Adequate for Discharge   Problem: Safety: Goal: Ability to remain free from injury will improve Outcome: Adequate for Discharge   Problem: Skin Integrity: Goal: Risk for impaired skin integrity will decrease Outcome: Adequate for Discharge   Problem: Education: Goal: Ability to demonstrate management of disease process will improve Outcome: Adequate for Discharge Goal: Ability to verbalize understanding of medication therapies will improve Outcome: Adequate for Discharge   Problem: Activity: Goal: Capacity to carry out activities will improve Outcome: Adequate for Discharge   Problem: Cardiac: Goal: Ability to achieve and maintain adequate cardiopulmonary perfusion will improve Outcome: Adequate for Discharge

## 2020-06-11 NOTE — Progress Notes (Signed)
D/C instructions given and reviewed. Questions answered and encouraged to call with any more questions. Tele and IV's removed. Tolerated well.

## 2020-06-11 NOTE — Progress Notes (Signed)
PROGRESS NOTE  [name] Merritt ELF:[id number] DOB: 1971-05-17 DOA: 06/08/2020 PCP: Renford Dills, MD   LOS: 2 days   Brief narrative: As per HPI,  [name] Merritt is an 49 y.o. male with h/o class 2 obesity; CAD (s/p DES to LAD); HTN; HLD; and DM who presented to the hospital on 8/17 after being sent from the cardiology infusion clinic due to tachycardia.  He was found to be in new-onset atrial flutter. He wasn't really feeling bad but his heart was beating fast and they sent him to the ER.  Patient did have a low-grade fever so medical team was consulted  Assessment/Plan:  Principal Problem:   Atrial flutter (HCC) Active Problems:   Hypertension   Hyperlipidemia with target LDL less than 70   DM type 2, goal HbA1c < 7% (HCC)   OSA (obstructive sleep apnea)   Morbid obesity (HCC)   Cough  Low-grade fever.  Cough. Continue as needed albuterol.  Mild leukocytosis and transient thrombocytosis.  Chest x-ray was unremarkable except for possible atelectasis..  Covid was negative.  CT scan of the chest abdomen and pelvis is negative for acute findings.  Blood cultures negative.  Urinalysis negative for infection.  WBC of 8.4 at this time.  Will hold off with antibiotics.  Possibility of atelectasis causing low-grade fever.  Encourage incentive spirometer.  No further testing required.  New onset atrial flutter, recent MI Admitted by cardiology. Currently on Eliquis aspirin Plavix, Toprol as per cardiology.  Status post TEE on 06/10/2020 with direct cardioversion. LV ejection fraction of 40 to 45% with apical hypokinesis.  No thrombus was detected.  Currently in normal sinus rhythm.  Recent anterolateral ST segment elevation MI status post catheterization on 05/30/2020.  Patient underwent PCI to the occluded mid LAD.  Currently on dual antiplatelets, Lipitor.  Essential hypertension. -On Toprol XL currently  DM type II. Continue sliding scale insulin, Accu-Cheks, diabetic diet.  Continue  Lantus.  HoldFarxiga while inpatient.  Latest POC glucose of 213.  Hyperlipidemia -Continue Lipitor  Class 2 obesity Would benefit from weight loss bariatric surgery evaluation as outpatient.  Obstructive sleep apnea. Nocturnal CPAP was ordered    DVT prophylaxis:  apixaban (ELIQUIS) tablet 5 mg    Code Status: Full code  Family Communication: None  Status is: Inpatient  Procedures:  TEE/ DC cardioversion on 06/10/2020  Antibiotics:  . None  Anti-infectives (From admission, onward)   None     Subjective: Today, patient was seen and examined at bedside.  No chest pain or shortness of breath, dyspnea.  No sputum production.  No urinary urgency frequency dysuria  Objective: Vitals:   06/10/20 2026 06/11/20 0524  BP: (!) 144/90 (!) 134/94  Pulse: 92 76  Resp: 19 18  Temp: 99.9 F (37.7 C) 98.7 F (37.1 C)  SpO2: 97% 98%    Intake/Output Summary (Last 24 hours) at 06/11/2020 0802 Last data filed at 06/10/2020 1900 Gross per 24 hour  Intake 43.28 ml  Output 800 ml  Net -756.72 ml   Filed Weights   06/08/20 1158 06/10/20 0446 06/11/20 0522  Weight: 135.2 kg 134.8 kg 135.1 kg   Body mass index is 38.24 kg/m.   Physical Exam:  GENERAL: Patient is alert awake and oriented. Not in obvious distress.  Morbidly obese HENT: No scleral pallor or icterus. Pupils equally reactive to light. Oral mucosa is moist NECK: is supple, no gross swelling noted. CHEST:  Diminished breath sounds bilaterally. CVS: S1 and S2 heard, no murmur. Regular  rate and rhythm.  ABDOMEN: Soft, non-tender, bowel sounds are present. EXTREMITIES: No edema. CNS: Cranial nerves are intact. No focal motor deficits. SKIN: warm and dry without rashes.  Data Review: I have personally reviewed the following laboratory data and studies,  CBC: Recent Labs  Lab 06/08/20 1222 06/10/20 0414 06/11/20 0508  WBC 11.3* 10.9* 8.4  HGB 12.0* 10.8* 10.2*  HCT 37.7* 33.8* 32.6*  MCV 94.5 94.9  96.2  PLT 576* 554* 529*   Basic Metabolic Panel: Recent Labs  Lab 06/08/20 1222 06/09/20 0324 06/10/20 0414 06/11/20 0508  NA 134* 134* 134* 135  K 4.5 4.2 4.2 4.3  CL 102 102 101 102  CO2 21* 22 23 23   GLUCOSE 174* 144* 171* 180*  BUN 16 17 16 15   CREATININE 1.17 1.16 1.02 1.07  CALCIUM 8.7* 8.7* 8.8* 8.6*   Liver Function Tests: No results for input(s): AST, ALT, ALKPHOS, BILITOT, PROT, ALBUMIN in the last 168 hours. No results for input(s): LIPASE, AMYLASE in the last 168 hours. No results for input(s): AMMONIA in the last 168 hours. Cardiac Enzymes: No results for input(s): CKTOTAL, CKMB, CKMBINDEX, TROPONINI in the last 168 hours. BNP (last 3 results) No results for input(s): BNP in the last 8760 hours.  ProBNP (last 3 results) No results for input(s): PROBNP in the last 8760 hours.  CBG: Recent Labs  Lab 06/10/20 0610 06/10/20 1145 06/10/20 1713 06/10/20 2125 06/11/20 0617  GLUCAP 172* 132* 132* 178* 213*   Recent Results (from the past 240 hour(s))  SARS Coronavirus 2 by RT PCR (hospital order, performed in [facility] hospital lab) Nasopharyngeal Nasopharyngeal Swab     Status: None   Collection Time: 06/08/20  4:55 PM   Specimen: Nasopharyngeal Swab  Result Value Ref Range Status   SARS Coronavirus 2 NEGATIVE NEGATIVE Final    Comment: (NOTE) SARS-CoV-2 target nucleic acids are NOT DETECTED.  The SARS-CoV-2 RNA is generally detectable in upper and lower respiratory specimens during the acute phase of infection. The lowest concentration of SARS-CoV-2 viral copies this assay can detect is 250 copies / mL. A negative result does not preclude SARS-CoV-2 infection and should not be used as the sole basis for treatment or other patient management decisions.  A negative result may occur with improper specimen collection / handling, submission of specimen other than nasopharyngeal swab, presence of viral mutation(s) within the areas targeted by this assay,  and inadequate number of viral copies (<250 copies / mL). A negative result must be combined with clinical observations, patient history, and epidemiological information.  Fact Sheet for Patients:   [facility]  Fact Sheet for Healthcare Providers: 06/10/20  This test is not yet approved or  cleared by the [employer] FDA and has been authorized for detection and/or diagnosis of SARS-CoV-2 by FDA under an Emergency Use Authorization (EUA).  This EUA will remain in effect (meaning this test can be used) for the duration of the COVID-19 declaration under Section 564(b)(1) of the Act, 21 U.S.C. section 360bbb-3(b)(1), unless the authorization is terminated or revoked sooner.  Performed at [facility], [address] [place], [address] [place]      Studies: CT CHEST W CONTRAST  Result Date: 06/10/2020 CLINICAL DATA:  Persistent cough, atrial flutter, leukocytosis EXAM: CT CHEST, ABDOMEN, AND PELVIS WITH CONTRAST TECHNIQUE: Multidetector CT imaging of the chest, abdomen and pelvis was performed following the standard protocol during bolus administration of intravenous contrast. CONTRAST:  70017 OMNIPAQUE IOHEXOL 300 MG/ML  SOLN  COMPARISON:  06/08/2020, 01/01/2020 FINDINGS: CT CHEST FINDINGS Cardiovascular: The heart and great vessels are unremarkable without pericardial effusion. Stent is identified within the LAD distribution. Normal caliber of the thoracic aorta. Mediastinum/Nodes: No enlarged mediastinal, hilar, or axillary lymph nodes. Thyroid gland, trachea, and esophagus demonstrate no significant findings. Lungs/Pleura: Scattered areas of atelectasis are seen at the lung bases. No airspace disease, effusion, or pneumothorax. Elevated right hemidiaphragm. Musculoskeletal: No acute or destructive bony lesions. Bilateral gynecomastia again noted. Reconstructed images demonstrate no additional findings. CT ABDOMEN PELVIS  FINDINGS Hepatobiliary: Heterogeneous decreased attenuation of the liver consistent with hepatic steatosis. The gallbladder is unremarkable. No biliary dilation. Pancreas: Unremarkable. No pancreatic ductal dilatation or surrounding inflammatory changes. Spleen: Normal in size without focal abnormality. Adrenals/Urinary Tract: Adrenal glands are unremarkable. Kidneys are normal, without renal calculi, focal lesion, or hydronephrosis. Bladder is unremarkable. Stomach/Bowel: No bowel obstruction or ileus. Normal appendix right lower quadrant. No bowel wall thickening or inflammatory change. Minimal sigmoid diverticulosis without diverticulitis. Vascular/Lymphatic: No significant vascular findings are present. No enlarged abdominal or pelvic lymph nodes. The Reproductive: Prostate is unremarkable. Other: No free fluid or free gas. No abdominal wall hernia. Musculoskeletal: No acute or destructive bony lesions. Reconstructed images demonstrate no additional findings. IMPRESSION: 1. No acute intrathoracic, intra-abdominal, or intrapelvic process. 2. Hepatic steatosis. 3. Minimal sigmoid diverticulosis without diverticulitis. 4. Stable gynecomastia. Electronically Signed   By: Sharlet Salina M.D.   On: 06/10/2020 20:04   CT ABDOMEN PELVIS W CONTRAST  Result Date: 06/10/2020 CLINICAL DATA:  Persistent cough, atrial flutter, leukocytosis EXAM: CT CHEST, ABDOMEN, AND PELVIS WITH CONTRAST TECHNIQUE: Multidetector CT imaging of the chest, abdomen and pelvis was performed following the standard protocol during bolus administration of intravenous contrast. CONTRAST:  OMNIPAQUE IOHEXOL 300 MG/ML  SOLN COMPARISON:  06/08/2020, 01/01/2020 FINDINGS: CT CHEST FINDINGS Cardiovascular: The heart and great vessels are unremarkable without pericardial effusion. Stent is identified within the LAD distribution. Normal caliber of the thoracic aorta. Mediastinum/Nodes: No enlarged mediastinal, hilar, or axillary lymph nodes.  Thyroid gland, trachea, and esophagus demonstrate no significant findings. Lungs/Pleura: Scattered areas of atelectasis are seen at the lung bases. No airspace disease, effusion, or pneumothorax. Elevated right hemidiaphragm. Musculoskeletal: No acute or destructive bony lesions. Bilateral gynecomastia again noted. Reconstructed images demonstrate no additional findings. CT ABDOMEN PELVIS FINDINGS Hepatobiliary: Heterogeneous decreased attenuation of the liver consistent with hepatic steatosis. The gallbladder is unremarkable. No biliary dilation. Pancreas: Unremarkable. No pancreatic ductal dilatation or surrounding inflammatory changes. Spleen: Normal in size without focal abnormality. Adrenals/Urinary Tract: Adrenal glands are unremarkable. Kidneys are normal, without renal calculi, focal lesion, or hydronephrosis. Bladder is unremarkable. Stomach/Bowel: No bowel obstruction or ileus. Normal appendix right lower quadrant. No bowel wall thickening or inflammatory change. Minimal sigmoid diverticulosis without diverticulitis. Vascular/Lymphatic: No significant vascular findings are present. No enlarged abdominal or pelvic lymph nodes. The Reproductive: Prostate is unremarkable. Other: No free fluid or free gas. No abdominal wall hernia. Musculoskeletal: No acute or destructive bony lesions. Reconstructed images demonstrate no additional findings. IMPRESSION: 1. No acute intrathoracic, intra-abdominal, or intrapelvic process. 2. Hepatic steatosis. 3. Minimal sigmoid diverticulosis without diverticulitis. 4. Stable gynecomastia. Electronically Signed   By: Sharlet Salina M.D.   On: 06/10/2020 20:04   ECHO TEE  Result Date: 06/10/2020    TRANSESOPHOGEAL ECHO REPORT   Patient Name:   [name] Merritt Date of Exam: 06/10/2020 Medical Rec #:  [id number]        Height:       74.0 in Accession #:  [id number]       Weight:       297.1 lb Date of Birth:  May 19, 1971        BSA:          2.572 m Patient Age:    49 years          BP:           125/96 mmHg Patient Gender: M                HR:           190 bpm. Exam Location:  Inpatient Procedure: Transesophageal Echo Indications:     atrial flutter  History:         Patient has prior history of Echocardiogram examinations, most                  recent 05/31/2020. Cardiomegaly, Arrythmias:Atrial Flutter; Risk                  Factors:Diabetes, Hypertension, Dyslipidemia and Sleep Apnea.  Sonographer:     Delcie Roch Referring Phys:  [id number] Diagnosing Phys: [name] MD PROCEDURE: After discussion of the risks and benefits of a TEE, an informed consent was obtained from the patient. The transesophogeal probe was passed without difficulty through the esophogus of the patient. Local oropharyngeal anesthetic was provided with Cetacaine. Sedation performed by different physician. The patient was monitored while under deep sedation. Anesthestetic sedation was provided intravenously by Anesthesiology: 370mg  of Propofol, 100mg  of Lidocaine. The patient developed no complications during the procedure. A direct current cardioversion was performed. IMPRESSIONS  1. Left ventricular ejection fraction, by estimation, is 40 to 45%. The left ventricle has mildly decreased function. The left ventricle demonstrates regional wall motion abnormalities. Apical hypokinesis.  2. Right ventricular systolic function is normal. The right ventricular size is normal.  3. The mitral valve is normal in structure. Trivial mitral valve regurgitation.  4. The aortic valve is tricuspid. Aortic valve regurgitation is not visualized. No aortic stenosis is present.  5. No left atrial/left atrial appendage thrombus was detected. FINDINGS  Left Ventricle: Left ventricular ejection fraction, by estimation, is 40 to 45%. The left ventricle has mildly decreased function. The left ventricle demonstrates regional wall motion abnormalities. The left ventricular internal cavity size was normal in size.  There is no left ventricular hypertrophy. Right Ventricle: The right ventricular size is normal. Right vetricular wall thickness was not assessed. Right ventricular systolic function is normal. Left Atrium: Left atrial size was normal in size. No left atrial/left atrial appendage thrombus was detected. Right Atrium: Right atrial size was normal in size. Pericardium: There is no evidence of pericardial effusion. Mitral Valve: The mitral valve is normal in structure. Trivial mitral valve regurgitation. Tricuspid Valve: The tricuspid valve is normal in structure. Tricuspid valve regurgitation is trivial. Aortic Valve: The aortic valve is tricuspid. Aortic valve regurgitation is not visualized. No aortic stenosis is present. Pulmonic Valve: The pulmonic valve was grossly normal. Pulmonic valve regurgitation is not visualized. Aorta: The aortic root and ascending aorta are structurally normal, with no evidence of dilitation. IAS/Shunts: No atrial level shunt detected by color flow Doppler. MD Electronically signed by MD Signature Date/Time: 06/10/2020/3:13:30 PM    Final       [name], MD  [facility] 06/11/2020

## 2020-06-15 ENCOUNTER — Telehealth: Payer: Self-pay | Admitting: Cardiovascular Disease

## 2020-06-15 ENCOUNTER — Encounter: Payer: BC Managed Care – PPO | Admitting: *Deleted

## 2020-06-15 ENCOUNTER — Other Ambulatory Visit: Payer: Self-pay

## 2020-06-15 ENCOUNTER — Encounter (HOSPITAL_COMMUNITY)
Admission: RE | Admit: 2020-06-15 | Discharge: 2020-06-15 | Disposition: A | Discharge status: Home / Self Care | Payer: BC Managed Care – PPO | Source: Ambulatory Visit | Attending: Internal Medicine | Admitting: Internal Medicine

## 2020-06-15 DIAGNOSIS — Z006 Encounter for examination for normal comparison and control in clinical research program: Secondary | ICD-10-CM | POA: Insufficient documentation

## 2020-06-15 MED ORDER — STUDY - AEGIS II STUDY - PLACEBO OR CSL112 (PI-HILTY)
170.0000 mL | Freq: Once | INTRAVENOUS | Status: AC
  Administered 2020-06-15: 170 mL via INTRAVENOUS
  Filled 2020-06-15: qty 170

## 2020-06-15 NOTE — Telephone Encounter (Signed)
Forms from [employer] received on 06/15/20. Completed patient auth attached. Took form to Dr. Earmon Phoenix box for completion. 06/15/20 vlm

## 2020-06-15 NOTE — Research (Signed)
Pre infusion Vitals:  145/ 85 Pulse 67   Resp 18  Temp 97.5    Post-infusion Vitals: 145/84 Pulse 57  Resp  18   Temp 97.7                                   "CONSENT"   YES     NO   Continuing further Investigational Product and study visits for follow-up? [x]  []   Continuing consent from future biomedical research [x]  []                                     "EVENTS"    YES     NO  AE   (IF YES SEE SOURCE) [x]  []   SAE  (IF YES SEE SOURCE) []  [x]   ENDPOINT   (IF YES SEE SOURCE) []  [x]   REVASCULARIZATION  (IF YES SEE SOURCE) []  [x]   AMPUTATION   (IF YES SEE SOURCE) []  [x]   TROPONIN'S  (IF YES SEE SOURCE) [x]  []    Troponin Levels X 2 drawn 06-08-2020 [facility] ED visit.   Patient admitted with Atrial Flutter with RVR and subsequently had TEE/DCCV on 06-10-2020.   Patient reports no abnormal heart rate or chest pain since his discharge from the Hospital on 06-11-2020.  Concomitant Medications were updated in the [facility].  Lifestyle Adherence Assessment:    YES NO  Abstinence from smoking/remaining tobacco free x   Cardiac Diet x   Routine physical activity and/or cardiac rehabilitation  x    Current Outpatient Medications:  .  acetaminophen (TYLENOL) 650 MG CR tablet, Take 1,300 mg by mouth every 8 (eight) hours as needed for pain., Disp: , Rfl:  .  albuterol (VENTOLIN HFA) 108 (90 Base) MCG/ACT inhaler, Inhale 2 puffs into the lungs every 6 (six) hours as needed for wheezing or shortness of breath., Disp: , Rfl:  .  apixaban (ELIQUIS) 5 MG TABS tablet, Take 1 tablet (5 mg total) by mouth 2 (two) times daily., Disp: 120 tablet, Rfl: 3 .  aspirin 81 MG chewable tablet, Chew 1 tablet (81 mg total) by mouth daily., Disp: 90 tablet, Rfl: 3 .  atorvastatin (LIPITOR) 80 MG tablet, Take 1 tablet (80 mg total) by mouth daily at 6 PM., Disp: 90 tablet, Rfl: 3 .  cetirizine (ZYRTEC) 10 MG tablet, Take 1 tablet (10 mg total) by mouth daily., Disp: 30 tablet, Rfl: 5 .  clopidogrel (PLAVIX) 75 MG  tablet, Take 1 tablet (75 mg total) by mouth daily., Disp: 90 tablet, Rfl: 3 .  dapagliflozin propanediol (FARXIGA) 5 MG TABS tablet, Take 1 tablet (5 mg total) by mouth daily., Disp: 30 tablet, Rfl: 3 .  fluticasone (FLONASE) 50 MCG/ACT nasal spray, SHAKE LIQUID AND USE 1 SPRAY IN EACH NOSTRIL DAILY (Patient taking differently: Place 2 sprays into both nostrils daily as needed for allergies or rhinitis. ), Disp: 16 g, Rfl: 2 .  LANTUS SOLOSTAR 100 UNIT/ML Solostar Pen, Inject 55 Units into the skin daily after breakfast., Disp: 15 mL, Rfl: 11 .  magnesium citrate SOLN, Take 1 Bottle by mouth once as needed for mild constipation or moderate constipation. , Disp: , Rfl:  .  metoprolol succinate (TOPROL-XL) 25 MG 24 hr tablet, Take 1 tablet (25 mg total) by mouth daily., Disp: 90 tablet, Rfl: 3 .  nitroGLYCERIN (NITROSTAT) 0.4 MG SL  tablet, Place 1 tablet (0.4 mg total) under the tongue every 5 (five) minutes as needed for chest pain., Disp: 25 tablet, Rfl: 3 .  pantoprazole (PROTONIX) 40 MG tablet, Take 40 mg by mouth 2 (two) times daily., Disp: , Rfl:  .  sacubitril-valsartan (ENTRESTO) 24-26 MG, Take 1 tablet by mouth 2 (two) times daily., Disp: 60 tablet, Rfl: 3

## 2020-06-17 DIAGNOSIS — I4892 Unspecified atrial flutter: Secondary | ICD-10-CM | POA: Diagnosis not present

## 2020-06-17 DIAGNOSIS — H25813 Combined forms of age-related cataract, bilateral: Secondary | ICD-10-CM | POA: Diagnosis not present

## 2020-06-17 DIAGNOSIS — E11319 Type 2 diabetes mellitus with unspecified diabetic retinopathy without macular edema: Secondary | ICD-10-CM | POA: Diagnosis not present

## 2020-06-17 DIAGNOSIS — G4700 Insomnia, unspecified: Secondary | ICD-10-CM | POA: Diagnosis not present

## 2020-06-17 DIAGNOSIS — H53469 Homonymous bilateral field defects, unspecified side: Secondary | ICD-10-CM | POA: Diagnosis not present

## 2020-06-17 DIAGNOSIS — E7800 Pure hypercholesterolemia, unspecified: Secondary | ICD-10-CM | POA: Diagnosis not present

## 2020-06-17 DIAGNOSIS — I2510 Atherosclerotic heart disease of native coronary artery without angina pectoris: Secondary | ICD-10-CM | POA: Diagnosis not present

## 2020-06-17 DIAGNOSIS — I10 Essential (primary) hypertension: Secondary | ICD-10-CM | POA: Diagnosis not present

## 2020-06-17 NOTE — Research (Signed)
This is now visit 3 instead of visit 4.  AE and Troponin not for this visit.   Updated:                                 "CONSENT"   YES     NO   Continuing further Investigational Product and study visits for follow-up? [x]  []   Continuing consent from future biomedical research [x]  []                                    "EVENTS"    YES     NO  AE   (IF YES SEE SOURCE) []  [x]   SAE  (IF YES SEE SOURCE) []  [x]   ENDPOINT   (IF YES SEE SOURCE) []  [x]   REVASCULARIZATION  (IF YES SEE SOURCE) []  [x]   AMPUTATION   (IF YES SEE SOURCE) []  [x]   TROPONIN'S  (IF YES SEE SOURCE) []  [x] 

## 2020-06-21 ENCOUNTER — Ambulatory Visit: Payer: BC Managed Care – PPO | Admitting: Student

## 2020-06-21 NOTE — Telephone Encounter (Signed)
Patient wife is calling to follow up regarding status of forms. Please call to discuss.

## 2020-06-21 NOTE — Telephone Encounter (Signed)
Paperwork was sent to [facility] as he is a Dr. Tresa Endo patient.

## 2020-06-22 ENCOUNTER — Encounter (HOSPITAL_COMMUNITY)
Admission: RE | Admit: 2020-06-22 | Discharge: 2020-06-22 | Disposition: A | Discharge status: Home / Self Care | Payer: BC Managed Care – PPO | Source: Ambulatory Visit | Attending: Internal Medicine | Admitting: Internal Medicine

## 2020-06-22 ENCOUNTER — Other Ambulatory Visit: Payer: Self-pay

## 2020-06-22 ENCOUNTER — Telehealth: Payer: Self-pay | Admitting: Cardiovascular Disease

## 2020-06-22 ENCOUNTER — Telehealth: Payer: Self-pay | Admitting: Physician Assistant

## 2020-06-22 ENCOUNTER — Encounter: Payer: BC Managed Care – PPO | Admitting: *Deleted

## 2020-06-22 VITALS — BP 141/86 | HR 62

## 2020-06-22 DIAGNOSIS — Z006 Encounter for examination for normal comparison and control in clinical research program: Secondary | ICD-10-CM

## 2020-06-22 MED ORDER — STUDY - AEGIS II STUDY - PLACEBO OR CSL112 (PI-HILTY)
170.0000 mL | Freq: Once | INTRAVENOUS | Status: AC
  Administered 2020-06-22: 170 mL via INTRAVENOUS
  Filled 2020-06-22: qty 170

## 2020-06-22 NOTE — Telephone Encounter (Signed)
Patient's wife calling in regards to the patient's short term disability paper work. She states he accidentally dropped it off at [address], but was told it would be forwarded over to [employer]. She would like to know if the paper work was received.

## 2020-06-22 NOTE — Telephone Encounter (Signed)
Forms from [employer] received on 06/22/20. Completed patient authorization attached.Took forms to [employer] for completion.  06/22/20  fsw     Forms from [employer] were sent from [address] office on 06/22/20. Completed patient authorization attached. Took disability forms to [employer] for completion. 06/22/20 fsw    Patient has appt with Lisabeth Devoid on 07/01/20.

## 2020-06-22 NOTE — Telephone Encounter (Signed)
Spoke with patients wife and patients work needs papers filled out  Advised may not be able to fill out until follow up next week but will see if can do another letter to keep him out until after follow visit next week Will forward to [employer] PA/Dr Tresa Endo for review

## 2020-06-23 NOTE — Telephone Encounter (Signed)
That is fine with me [name] Merritt, patient recently had anterior MI followed by a second hospitalization for afib with RVR. Ok to stay out of work until next week.

## 2020-06-23 NOTE — Telephone Encounter (Signed)
Spoke with wife and she found out nothing needed until next week at visit

## 2020-06-24 DIAGNOSIS — Z20828 Contact with and (suspected) exposure to other viral communicable diseases: Secondary | ICD-10-CM | POA: Diagnosis not present

## 2020-06-24 NOTE — Research (Signed)
AEGIS V4  Patient doing well, no complaints of chest pains or shortness of breath.   No med changes.                                    "CONSENT"   YES     NO   Continuing further Investigational Product and study visits for follow-up? [x]  []   Continuing consent from future biomedical research [x]  []                                    "EVENTS"    YES     NO  AE   (IF YES SEE SOURCE) []  [x]   SAE  (IF YES SEE SOURCE) []  [x]   ENDPOINT   (IF YES SEE SOURCE) []  [x]   REVASCULARIZATION  (IF YES SEE SOURCE) []  [x]   AMPUTATION   (IF YES SEE SOURCE) []  [x]   TROPONIN'S  (IF YES SEE SOURCE) []  [x]     Current Outpatient Medications:    acetaminophen (TYLENOL) 650 MG CR tablet, Take 1,300 mg by mouth every 8 (eight) hours as needed for pain., Disp: , Rfl:    albuterol (VENTOLIN HFA) 108 (90 Base) MCG/ACT inhaler, Inhale 2 puffs into the lungs every 6 (six) hours as needed for wheezing or shortness of breath., Disp: , Rfl:    apixaban (ELIQUIS) 5 MG TABS tablet, Take 1 tablet (5 mg total) by mouth 2 (two) times daily., Disp: 120 tablet, Rfl: 3   aspirin 81 MG chewable tablet, Chew 1 tablet (81 mg total) by mouth daily., Disp: 90 tablet, Rfl: 3   atorvastatin (LIPITOR) 80 MG tablet, Take 1 tablet (80 mg total) by mouth daily at 6 PM., Disp: 90 tablet, Rfl: 3   cetirizine (ZYRTEC) 10 MG tablet, Take 1 tablet (10 mg total) by mouth daily., Disp: 30 tablet, Rfl: 5   clopidogrel (PLAVIX) 75 MG tablet, Take 1 tablet (75 mg total) by mouth daily., Disp: 90 tablet, Rfl: 3   dapagliflozin propanediol (FARXIGA) 5 MG TABS tablet, Take 1 tablet (5 mg total) by mouth daily., Disp: 30 tablet, Rfl: 3   fluticasone (FLONASE) 50 MCG/ACT nasal spray, SHAKE LIQUID AND USE 1 SPRAY IN EACH NOSTRIL DAILY (Patient taking differently: Place 2 sprays into both nostrils daily as needed for allergies or rhinitis. ), Disp: 16 g, Rfl: 2   LANTUS SOLOSTAR 100 UNIT/ML Solostar Pen, Inject 55 Units into the skin daily after  breakfast., Disp: 15 mL, Rfl: 11   magnesium citrate SOLN, Take 1 Bottle by mouth once as needed for mild constipation or moderate constipation. , Disp: , Rfl:    metoprolol succinate (TOPROL-XL) 25 MG 24 hr tablet, Take 1 tablet (25 mg total) by mouth daily., Disp: 90 tablet, Rfl: 3   nitroGLYCERIN (NITROSTAT) 0.4 MG SL tablet, Place 1 tablet (0.4 mg total) under the tongue every 5 (five) minutes as needed for chest pain., Disp: 25 tablet, Rfl: 3   pantoprazole (PROTONIX) 40 MG tablet, Take 40 mg by mouth 2 (two) times daily., Disp: , Rfl:    sacubitril-valsartan (ENTRESTO) 24-26 MG, Take 1 tablet by mouth 2 (two) times daily., Disp: 60 tablet, Rfl: 3

## 2020-06-24 NOTE — Research (Unsigned)
Patient was seen with Ms. Lutterloh in the infusion clinic, and was short of breath.  Onset had been yesterday after taking pseudoephedrine for congestion. He does not have chest pain.  His wife encouraged him to come earlier.  Current tracing demonstrates rapid rate and findings of 2:1 atrial flutter.  We wheeled patient to the ER into the triage area for assessment and probable admission.  Patient has recent anterior wall MI.  Patient ultimately placed in ER room, and diltiazem drip started with better rate control.    Arturo Morton. Riley Kill, MD, [facility], [facility] Medical Director, [facility]

## 2020-06-29 ENCOUNTER — Ambulatory Visit (HOSPITAL_COMMUNITY)
Admission: RE | Admit: 2020-06-29 | Discharge: 2020-06-29 | Disposition: A | Discharge status: Home / Self Care | Payer: BC Managed Care – PPO | Source: Ambulatory Visit | Attending: Internal Medicine | Admitting: Internal Medicine

## 2020-06-29 ENCOUNTER — Encounter: Payer: BC Managed Care – PPO | Admitting: *Deleted

## 2020-06-29 ENCOUNTER — Other Ambulatory Visit: Payer: Self-pay

## 2020-06-29 VITALS — BP 134/78 | HR 62

## 2020-06-29 DIAGNOSIS — Z006 Encounter for examination for normal comparison and control in clinical research program: Secondary | ICD-10-CM

## 2020-06-29 MED ORDER — STUDY - AEGIS II STUDY - PLACEBO OR CSL112 (PI-HILTY)
170.0000 mL | Freq: Once | INTRAVENOUS | Status: AC
  Administered 2020-06-29: 170 mL via INTRAVENOUS
  Filled 2020-06-29: qty 170

## 2020-06-29 NOTE — Research (Signed)
V5   Patient doing well no complaints of chest pains or shortness of breath. No med changes.   PK drawn today:                                   "CONSENT"   YES     NO   Continuing further Investigational Product and study visits for follow-up? [x]  []   Continuing consent from future biomedical research [x]  []                                    "EVENTS"    YES     NO  AE   (IF YES SEE SOURCE) []  [x]   SAE  (IF YES SEE SOURCE) []  [x]   ENDPOINT   (IF YES SEE SOURCE) []  [x]   REVASCULARIZATION  (IF YES SEE SOURCE) []  [x]   AMPUTATION   (IF YES SEE SOURCE) []  [x]   TROPONIN'S  (IF YES SEE SOURCE) []  [x]     Current Outpatient Medications:  .  acetaminophen (TYLENOL) 650 MG CR tablet, Take 1,300 mg by mouth every 8 (eight) hours as needed for pain., Disp: , Rfl:  .  albuterol (VENTOLIN HFA) 108 (90 Base) MCG/ACT inhaler, Inhale 2 puffs into the lungs every 6 (six) hours as needed for wheezing or shortness of breath., Disp: , Rfl:  .  apixaban (ELIQUIS) 5 MG TABS tablet, Take 1 tablet (5 mg total) by mouth 2 (two) times daily., Disp: 120 tablet, Rfl: 3 .  aspirin 81 MG chewable tablet, Chew 1 tablet (81 mg total) by mouth daily., Disp: 90 tablet, Rfl: 3 .  atorvastatin (LIPITOR) 80 MG tablet, Take 1 tablet (80 mg total) by mouth daily at 6 PM., Disp: 90 tablet, Rfl: 3 .  cetirizine (ZYRTEC) 10 MG tablet, Take 1 tablet (10 mg total) by mouth daily., Disp: 30 tablet, Rfl: 5 .  clopidogrel (PLAVIX) 75 MG tablet, Take 1 tablet (75 mg total) by mouth daily., Disp: 90 tablet, Rfl: 3 .  dapagliflozin propanediol (FARXIGA) 5 MG TABS tablet, Take 1 tablet (5 mg total) by mouth daily., Disp: 30 tablet, Rfl: 3 .  fluticasone (FLONASE) 50 MCG/ACT nasal spray, SHAKE LIQUID AND USE 1 SPRAY IN EACH NOSTRIL DAILY (Patient taking differently: Place 2 sprays into both nostrils daily as needed for allergies or rhinitis. ), Disp: 16 g, Rfl: 2 .  LANTUS SOLOSTAR 100 UNIT/ML Solostar Pen, Inject 55 Units into the skin  daily after breakfast., Disp: 15 mL, Rfl: 11 .  magnesium citrate SOLN, Take 1 Bottle by mouth once as needed for mild constipation or moderate constipation. , Disp: , Rfl:  .  metoprolol succinate (TOPROL-XL) 25 MG 24 hr tablet, Take 1 tablet (25 mg total) by mouth daily., Disp: 90 tablet, Rfl: 3 .  nitroGLYCERIN (NITROSTAT) 0.4 MG SL tablet, Place 1 tablet (0.4 mg total) under the tongue every 5 (five) minutes as needed for chest pain., Disp: 25 tablet, Rfl: 3 .  pantoprazole (PROTONIX) 40 MG tablet, Take 40 mg by mouth 2 (two) times daily., Disp: , Rfl:  .  sacubitril-valsartan (ENTRESTO) 24-26 MG, Take 1 tablet by mouth 2 (two) times daily., Disp: 60 tablet, Rfl: 3

## 2020-07-01 ENCOUNTER — Other Ambulatory Visit: Payer: Self-pay | Admitting: Physician Assistant

## 2020-07-01 ENCOUNTER — Ambulatory Visit: Payer: BC Managed Care – PPO | Admitting: Physician Assistant

## 2020-07-01 ENCOUNTER — Other Ambulatory Visit: Payer: Self-pay

## 2020-07-01 ENCOUNTER — Encounter: Payer: Self-pay | Admitting: Physician Assistant

## 2020-07-01 VITALS — BP 150/88 | HR 64 | Ht 73.0 in | Wt 304.0 lb

## 2020-07-01 DIAGNOSIS — Z794 Long term (current) use of insulin: Secondary | ICD-10-CM

## 2020-07-01 DIAGNOSIS — E785 Hyperlipidemia, unspecified: Secondary | ICD-10-CM

## 2020-07-01 DIAGNOSIS — D649 Anemia, unspecified: Secondary | ICD-10-CM | POA: Diagnosis not present

## 2020-07-01 DIAGNOSIS — E119 Type 2 diabetes mellitus without complications: Secondary | ICD-10-CM

## 2020-07-01 DIAGNOSIS — I4892 Unspecified atrial flutter: Secondary | ICD-10-CM | POA: Diagnosis not present

## 2020-07-01 DIAGNOSIS — I2510 Atherosclerotic heart disease of native coronary artery without angina pectoris: Secondary | ICD-10-CM

## 2020-07-01 DIAGNOSIS — I10 Essential (primary) hypertension: Secondary | ICD-10-CM

## 2020-07-01 DIAGNOSIS — I255 Ischemic cardiomyopathy: Secondary | ICD-10-CM

## 2020-07-01 LAB — CBC
Hematocrit: 41.1 % (ref 37.5–51.0)
Hemoglobin: 13.6 g/dL (ref 13.0–17.7)
MCH: 30.4 pg (ref 26.6–33.0)
MCHC: 33.1 g/dL (ref 31.5–35.7)
MCV: 92 fL (ref 79–97)
Platelets: 266 10*3/uL (ref 150–450)
RBC: 4.47 x10E6/uL (ref 4.14–5.80)
RDW: 12.3 % (ref 11.6–15.4)
WBC: 4.7 10*3/uL (ref 3.4–10.8)

## 2020-07-01 LAB — BASIC METABOLIC PANEL
BUN/Creatinine Ratio: 14 (ref 9–20)
BUN: 15 mg/dL (ref 6–24)
CO2: 24 mmol/L (ref 20–29)
Calcium: 9.6 mg/dL (ref 8.7–10.2)
Chloride: 102 mmol/L (ref 96–106)
Creatinine, Ser: 1.08 mg/dL (ref 0.76–1.27)
GFR calc Af Amer: 93 mL/min/{1.73_m2} (ref >59)
GFR calc non Af Amer: 81 mL/min/{1.73_m2} (ref >59)
Glucose: 173 mg/dL — ABNORMAL HIGH (ref 65–99)
Potassium: 5.1 mmol/L (ref 3.5–5.2)
Sodium: 139 mmol/L (ref 134–144)

## 2020-07-01 MED ORDER — ATORVASTATIN CALCIUM 80 MG PO TABS
80.0000 mg | ORAL_TABLET | Freq: Every day | ORAL | 3 refills | Status: DC
Start: 2020-07-01 — End: 2023-02-16

## 2020-07-01 MED ORDER — DAPAGLIFLOZIN PROPANEDIOL 5 MG PO TABS
5.0000 mg | ORAL_TABLET | Freq: Every day | ORAL | 1 refills | Status: DC
Start: 2020-07-01 — End: 2021-03-14

## 2020-07-01 MED ORDER — SACUBITRIL-VALSARTAN 49-51 MG PO TABS: 1.0000 | ORAL_TABLET | Freq: Two times a day (BID) | ORAL | 3 refills | Status: DC

## 2020-07-01 NOTE — Patient Instructions (Addendum)
Medication Instructions:   STOP Aspirin  INCREASE Entresto to 49-51 1 tablet 2 times a day *If you need a refill on your cardiac medications before your next appointment, please call your pharmacy*  Lab Work: Your physician recommends that you return for lab work TODAY:   BMET  CBC  Your physician recommends that you return for lab work in 4 weeks 07/29/2020:   Fasting Lipid Panel-DO NOT EAT OR DRINK PAST MIDNIGHT. OKAY TO HAVE WATER.  Hepatic (Liver) Function Test  If you have labs (blood work) drawn today and your tests are completely normal, you will receive your results only by: Marland Kitchen MyChart Message (if you have MyChart) OR . A paper copy in the mail If you have any lab test that is abnormal or we need to change your treatment, we will call you to review the results.  Testing/Procedures: NONE ordered at this time of appointment   Follow-Up: At [facility], you and your health needs are our priority.  As part of our continuing mission to provide you with exceptional heart care, we have created designated Provider Care Teams.  These Care Teams include your primary Cardiologist (physician) and Advanced Practice Providers (APPs -  Physician Assistants and Nurse Practitioners) who all work together to provide you with the care you need, when you need it.   Your next appointment:   2-3 week(s)  2-3 month(s)  The format for your next appointment:   In Person  In Person  Provider:   Azalee Course, PA-C Nicki Guadalajara, MD  Other Instructions  Recommend over the counter stool softeners Colace, Docusate Sodium, and Miralax

## 2020-07-01 NOTE — Progress Notes (Signed)
Cardiology Office Note:    Date:  07/02/2020   ID:  [name] Merritt, DOB October 26, 1970, MRN [id number]  PCP:  Renford Dills, MD  [facility] Cardiologist:  Nicki Guadalajara, MD  [facility] Electrophysiologist:  None   Referring MD: Renford Dills, MD   Chief Complaint  Patient presents with  . Hospitalization Follow-up    seen for Dr. Tresa Endo.    History of Present Illness:    [name] Merritt is a 49 y.o. male with a hx of HTN, HLD, IDDM, obesity, OSA and recently diagnosed CAD and PAFlutter.  He initially presented to the hospital on 05/30/2020 as anterolateral STEMI.  Urgent cardiac catheterization performed on the same day showed 50% OM 3, 70% RPDA, 70% D1, 100% mid LAD occlusion treated with resolute Onyx 3.0 x 22 mm DES.  Postprocedure, he was placed on aspirin and Brilinta.  Echocardiogram performed on 05/31/2020 showed EF 35 to 40%, akinesis of the mid septum into the apex consistent with LAD infarction, RVSP 19.3 mmHg, grade 2 DD.  He was initially placed on metoprolol, this was later switched to carvedilol.  He was also placed on Entresto and high intensity statin.  Note, he is currently enrolled in AEGIS trial.  He presented for his infusion as part of AEGIS trial on 8/17 as outpatient, however was noted to be in atrial flutter.  He was subsequently sent to the emergency room.  Prior to that, he took Sudafed for nasal congestion.  Carvedilol was switched back to metoprolol for better rate control with less BP effect.  He was also started on Eliquis as well.  Brilinta was switched to Plavix given the need for anticoagulation therapy.  Patient eventually underwent TEE DCCV by Dr. Bjorn Pippin on 06/10/2020.  Postprocedure, he was discharged on Plavix and Eliquis combination.  Patient presents today along with his wife.  He denies any recent shortness of breath.  He had 2 episodes of chest pain since he left the hospital both resolved with sublingual nitroglycerin.  The last episode was during  walk about a week ago.  He has been able to walk since without any recurrent chest discomfort.  He has been compliant with aspirin, Plavix and Eliquis.  Since he is 1 month out from the stent placement, I will discontinue aspirin and continue on Plavix and Eliquis therapy.  EKG continue to show T wave inversion in the anterior leads.  He will need CBC and basic metabolic panel today and a fasting lipid panel and liver function in 4 weeks.  His blood pressure is elevated today even on manual recheck.  I will increase his Entresto to 49/51 mg twice a day.  He has occasional dizzy spell, we will continue to observe this.  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  Given his recent dizziness, I will delay him going back to work until 05/11/2020, especially considering his work involves heavy lifting.  After he goes back to work, he will need a lifting restriction of less than 20 pounds for the next 2 weeks before allowed to work full-time.  Ideally, if he can be switched to a daytime job, I think they will be better for him.  Past Medical History:  Diagnosis Date  . Coronary artery disease   . Diabetes mellitus without complication (HCC)   . High cholesterol   . Hypertension   . Myocardial infarction (HCC)   . New onset atrial flutter (HCC) 06/09/2020  . Overweight  Past Surgical History:  Procedure Laterality Date  . CARDIOVERSION N/A 06/10/2020   Procedure: CARDIOVERSION;  Surgeon: Little Ishikawa, MD;  Location: [facility] ENDOSCOPY;  Service: Cardiovascular;  Laterality: N/A;  . CORONARY STENT INTERVENTION N/A 05/30/2020   Procedure: CORONARY STENT INTERVENTION;  Surgeon: Lennette Bihari, MD;  Location: [facility] INVASIVE CV LAB;  Service: Cardiovascular;  Laterality: N/A;  . CORONARY/GRAFT ACUTE MI REVASCULARIZATION N/A 05/30/2020   Procedure: Coronary/Graft Acute MI Revascularization;  Surgeon: Lennette Bihari, MD;  Location: [facility] INVASIVE CV LAB;  Service:  Cardiovascular;  Laterality: N/A;  . LEFT HEART CATH AND CORONARY ANGIOGRAPHY N/A 05/30/2020   Procedure: LEFT HEART CATH AND CORONARY ANGIOGRAPHY;  Surgeon: Lennette Bihari, MD;  Location: [facility] INVASIVE CV LAB;  Service: Cardiovascular;  Laterality: N/A;  . TEE WITHOUT CARDIOVERSION N/A 06/10/2020   Procedure: TRANSESOPHAGEAL ECHOCARDIOGRAM (TEE);  Surgeon: Little Ishikawa, MD;  Location: [facility] ENDOSCOPY;  Service: Cardiovascular;  Laterality: N/A;  . torn rotator cuff  2018   2018    Current Medications: Current Meds  Medication Sig  . acetaminophen (TYLENOL) 650 MG CR tablet Take 1,300 mg by mouth every 8 (eight) hours as needed for pain.  Marland Kitchen albuterol (VENTOLIN HFA) 108 (90 Base) MCG/ACT inhaler Inhale 2 puffs into the lungs every 6 (six) hours as needed for wheezing or shortness of breath.  Marland Kitchen apixaban (ELIQUIS) 5 MG TABS tablet Take 1 tablet (5 mg total) by mouth 2 (two) times daily.  Marland Kitchen atorvastatin (LIPITOR) 80 MG tablet Take 1 tablet (80 mg total) by mouth daily at 6 PM.  . cetirizine (ZYRTEC) 10 MG tablet Take 1 tablet (10 mg total) by mouth daily.  . clopidogrel (PLAVIX) 75 MG tablet Take 1 tablet (75 mg total) by mouth daily.  . dapagliflozin propanediol (FARXIGA) 5 MG TABS tablet Take 1 tablet (5 mg total) by mouth daily.  . fluticasone (FLONASE) 50 MCG/ACT nasal spray SHAKE LIQUID AND USE 1 SPRAY IN EACH NOSTRIL DAILY (Patient taking differently: Place 2 sprays into both nostrils daily as needed for allergies or rhinitis. )  . LANTUS SOLOSTAR 100 UNIT/ML Solostar Pen Inject 55 Units into the skin daily after breakfast.  . magnesium citrate SOLN Take 1 Bottle by mouth once as needed for mild constipation or moderate constipation.   . metoprolol succinate (TOPROL-XL) 25 MG 24 hr tablet Take 1 tablet (25 mg total) by mouth daily.  . nitroGLYCERIN (NITROSTAT) 0.4 MG SL tablet Place 1 tablet (0.4 mg total) under the tongue every 5 (five) minutes as needed for chest pain.  . pantoprazole  (PROTONIX) 40 MG tablet Take 40 mg by mouth 2 (two) times daily.  . [DISCONTINUED] aspirin 81 MG chewable tablet Chew 1 tablet (81 mg total) by mouth daily.  . [DISCONTINUED] atorvastatin (LIPITOR) 80 MG tablet Take 1 tablet (80 mg total) by mouth daily at 6 PM.  . [DISCONTINUED] dapagliflozin propanediol (FARXIGA) 5 MG TABS tablet Take 1 tablet (5 mg total) by mouth daily.  . [DISCONTINUED] sacubitril-valsartan (ENTRESTO) 24-26 MG Take 1 tablet by mouth 2 (two) times daily.     Allergies:   Pineapple, Tomato, and Rosuvastatin calcium   Social History   Socioeconomic History  . Marital status: Single    Spouse name: Not on file  . Number of children: Not on file  . Years of education: Not on file  . Highest education level: Not on file  Occupational History  . Occupation: maintenance  Tobacco Use  . Smoking status: Never Smoker  .  Smokeless tobacco: Former Neurosurgeon    Types: Engineer, drilling  . Vaping Use: Never used  Substance and Sexual Activity  . Alcohol use: Yes    Comment: occasionally  . Drug use: Not Currently    Types: Marijuana    Comment: quit 10-15 years ago  . Sexual activity: Not on file  Other Topics Concern  . Not on file  Social History Narrative  . Not on file   Social Determinants of Health   Financial Resource Strain:   . Difficulty of Paying Living Expenses: Not on file  Food Insecurity:   . Worried About Programme researcher, broadcasting/film/video in the Last Year: Not on file  . Ran Out of Food in the Last Year: Not on file  Transportation Needs:   . Lack of Transportation (Medical): Not on file  . Lack of Transportation (Non-Medical): Not on file  Physical Activity:   . Days of Exercise per Week: Not on file  . Minutes of Exercise per Session: Not on file  Stress:   . Feeling of Stress : Not on file  Social Connections:   . Frequency of Communication with Friends and Family: Not on file  . Frequency of Social Gatherings with Friends and Family: Not on file  .  Attends Religious Services: Not on file  . Active Member of Clubs or Organizations: Not on file  . Attends Banker Meetings: Not on file  . Marital Status: Not on file     Family History: The patient's family history includes Diabetes in his father, maternal grandfather, maternal grandmother, mother, paternal grandfather, and paternal grandmother; Hypertension in his father, maternal grandfather, maternal grandmother, mother, paternal grandfather, and paternal grandmother. There is no history of Asthma.  ROS:   Please see the history of present illness.     All other systems reviewed and are negative.  EKGs/Labs/Other Studies Reviewed:    The following studies were reviewed today:  Cath 05/30/2020  3rd Mrg lesion is 50% stenosed.  LPDA lesion is 70% stenosed.  LPAV-1 lesion is 30% stenosed.  LPAV-2 lesion is 30% stenosed.  1st Diag lesion is 70% stenosed.  Ramus lesion is 20% stenosed.  Mid LAD lesion is 100% stenosed.  Post intervention, there is a 0% residual stenosis.  A stent was successfully placed.   Acute anterolateral ST segment elevation myocardial infarction secondary to total occlusion of the mid LAD with TIMI 0 flow and absence of collaterals.  Concomitant CAD with tortuous 1st diagonal vessel with 70% narrowing beyond a sharp bend in the vessel, 20% narrowing beyond a sharp bend in the ramus immediate vessel, and dominant left circumflex coronary artery with 50% focal OM 3 stenosis, distal 30% stenoses in the AV groove, and focal 70% stenosis in the midportion of a small PLA branch.  Normal small nondominant RCA.  LVEDP 12 millimeters Hg.   Successful PCI to the totally occluded mid LAD with ultimate insertion of a Resolute Onyx 3.0 x 22 mm DES stent postdilated to 3.25 mm with digital narrowing of 0% and restoration of TIMI-3 flow.  RECOMMENDATION: DAPT for minimum of 1 year.  Aggressive lipid-lowering therapy with titration of atorvastatin  to 80 mg daily.  We will reinitiate ARB therapy with losartan initially at lower dose, add low-dose beta-blocker therapy, and obtain 2D echo Doppler study in a.m. optimal blood pressure control with target blood pressure less than 130/80 and ideal blood pressure less than 120/80.  We will diabetes control.  If  patient has LV dysfunction, SGLT 2 inhibition can be considered.   Echo 05/31/2020 1. Akinesis of the mid septum into the apex consistent with LAD  infarction. Contrast imaging shows no evidence of LV thrombus. Left  ventricular ejection fraction, by estimation, is 35 to 40%. The left  ventricle has moderately decreased function. The  left ventricle demonstrates regional wall motion abnormalities (see  scoring diagram/findings for description). Left ventricular diastolic  parameters are consistent with Grade II diastolic dysfunction  (pseudonormalization). Elevated left atrial pressure.  2. Right ventricular systolic function is normal. The right ventricular  size is normal. There is normal pulmonary artery systolic pressure. The  estimated right ventricular systolic pressure is 19.3 mmHg.  3. The mitral valve is grossly normal. Trivial mitral valve  regurgitation. No evidence of mitral stenosis.  4. The aortic valve is tricuspid. Aortic valve regurgitation is not  visualized. Mild aortic valve sclerosis is present, with no evidence of  aortic valve stenosis.  5. The inferior vena cava is normal in size with greater than 50%  respiratory variability, suggesting right atrial pressure of 3 mmHg.   Conclusion(s)/Recommendation(s): Findings consistent with ischemic cardiomyopathy.   TEE 06/10/2020 1. Left ventricular ejection fraction, by estimation, is 40 to 45%. The  left ventricle has mildly decreased function. The left ventricle  demonstrates regional wall motion abnormalities. Apical hypokinesis.  2. Right ventricular systolic function is normal. The right ventricular  size  is normal.  3. The mitral valve is normal in structure. Trivial mitral valve  regurgitation.  4. The aortic valve is tricuspid. Aortic valve regurgitation is not  visualized. No aortic stenosis is present.  5. No left atrial/left atrial appendage thrombus was detected.    EKG:  EKG is ordered today.  The ekg ordered today demonstrates NSR with anterolateral TWI and Q wave in the inferior leads  Recent Labs: 05/30/2020: ALT 31 06/09/2020: TSH 1.172 07/01/2020: BUN 15; Creatinine, Ser 1.08; Hemoglobin 13.6; Platelets 266; Potassium 5.1; Sodium 139  Recent Lipid Panel    Component Value Date/Time   CHOL 245 (H) 05/30/2020 0952   TRIG 75 05/30/2020 0952   HDL 48 05/30/2020 0952   CHOLHDL 5.1 05/30/2020 0952   VLDL 15 05/30/2020 0952   LDLCALC 182 (H) 05/30/2020 0952    Physical Exam:    VS:  BP (!) 150/88   Pulse 64   Ht  (1.854 m)   Wt (!) 304 lb (137.9 kg)   BMI 40.11 kg/m     Wt Readings from Last 3 Encounters:  07/01/20 (!) 304 lb (137.9 kg)  06/29/20 (!) 305 lb (138.3 kg)  06/22/20 300 lb (136.1 kg)     GEN:  Well nourished, well developed in no acute distress HEENT: Normal NECK: No JVD; No carotid bruits LYMPHATICS: No lymphadenopathy CARDIAC: RRR, no murmurs, rubs, gallops RESPIRATORY:  Clear to auscultation without rales, wheezing or rhonchi  ABDOMEN: Soft, non-tender, non-distended MUSCULOSKELETAL:  No edema; No deformity  SKIN: Warm and dry NEUROLOGIC:  Alert and oriented x 3 PSYCHIATRIC:  Normal affect   ASSESSMENT:    1. Coronary artery disease involving native coronary artery of native heart without angina pectoris   2. Hyperlipidemia with target LDL less than 70   3. Anemia, unspecified type   4. Atrial flutter, unspecified type (HCC)   5. Essential hypertension   6. Controlled type 2 diabetes mellitus without complication, with long-term current use of insulin (HCC)    PLAN:    In order of problems  listed above:  1. CAD: Recently  underwent a DES to LAD after anterolateral STEMI.  He was initially placed on aspirin and Brilinta, Brilinta was later switched to Plavix due to the need for Eliquis secondary to new onset of atrial flutter.  Since he is 1 month out from the initial stent placement, we will discontinue aspirin and continue on combination of Plavix and Eliquis from this point forth.  2. Atrial flutter: Shortly after he was discharged from the hospital after anterior STEMI, he returned to the hospital from 9/17-9/20 atrial flutter with RVR.  He underwent TEE DCCV on 9/19.  He is currently maintaining sinus rhythm based on today's EKG.  Continue Eliquis and rate control strategy  3. Anemia: Mild anemia in the hospital, repeat CBC  4. Ischemic cardiomyopathy: Baseline EF 35 to 40% on previous echocardiogram.  We will continue to uptitrate heart failure therapy.  Increase Entresto to 49-51 mg twice a day. Will defer to Dr. Tresa Endo to decide on the timing of repeat echocardiogram.  5. Hypertension: Blood pressure remains elevated.  Will increase Entresto to 49-51 mg twice a day.  Obtain basic metabolic panel  6. Hyperlipidemia: Fasting lipid panel and LFT in 4 weeks  7. DM2: On insulin, managed by primary care provider.   Medication Adjustments/Labs and Tests Ordered: Current medicines are reviewed at length with the patient today.  Concerns regarding medicines are outlined above.  Orders Placed This Encounter  Procedures  . Basic metabolic panel  . CBC  . Hepatic function panel  . Lipid panel  . EKG 12-Lead   Meds ordered this encounter  Medications  . sacubitril-valsartan (ENTRESTO) 49-51 MG    Sig: Take 1 tablet by mouth 2 (two) times daily.    Dispense:  180 tablet    Refill:  3  . dapagliflozin propanediol (FARXIGA) 5 MG TABS tablet    Sig: Take 1 tablet (5 mg total) by mouth daily.    Dispense:  90 tablet    Refill:  1  . atorvastatin (LIPITOR) 80 MG tablet    Sig: Take 1 tablet (80 mg total) by  mouth daily at 6 PM.    Dispense:  90 tablet    Refill:  3    Patient Instructions  Medication Instructions:   STOP Aspirin  INCREASE Entresto to 49-51 1 tablet 2 times a day *If you need a refill on your cardiac medications before your next appointment, please call your pharmacy*  Lab Work: Your physician recommends that you return for lab work TODAY:   BMET  CBC  Your physician recommends that you return for lab work in 4 weeks 07/29/2020:   Fasting Lipid Panel-DO NOT EAT OR DRINK PAST MIDNIGHT. OKAY TO HAVE WATER.  Hepatic (Liver) Function Test  If you have labs (blood work) drawn today and your tests are completely normal, you will receive your results only by: Marland Kitchen MyChart Message (if you have MyChart) OR . A paper copy in the mail If you have any lab test that is abnormal or we need to change your treatment, we will call you to review the results.  Testing/Procedures: NONE ordered at this time of appointment   Follow-Up: At [facility], you and your health needs are our priority.  As part of our continuing mission to provide you with exceptional heart care, we have created designated Provider Care Teams.  These Care Teams include your primary Cardiologist (physician) and Advanced Practice Providers (APPs -  Physician Assistants and Nurse Practitioners)  who all work together to provide you with the care you need, when you need it.   Your next appointment:   2-3 week(s)  2-3 month(s)  The format for your next appointment:   In Person  In Person  Provider:   Azalee Course, PA-C Nicki Guadalajara, MD  Other Instructions  Recommend over the counter stool softeners Colace, Docusate Sodium, and Miralax      Signed, Azalee Course, [place]  07/02/2020 9:05 AM    [facility]

## 2020-07-02 ENCOUNTER — Encounter: Payer: Self-pay | Admitting: Physician Assistant

## 2020-07-02 DIAGNOSIS — E1169 Type 2 diabetes mellitus with other specified complication: Secondary | ICD-10-CM | POA: Diagnosis not present

## 2020-07-02 DIAGNOSIS — I2510 Atherosclerotic heart disease of native coronary artery without angina pectoris: Secondary | ICD-10-CM | POA: Diagnosis not present

## 2020-07-02 DIAGNOSIS — G4733 Obstructive sleep apnea (adult) (pediatric): Secondary | ICD-10-CM | POA: Diagnosis not present

## 2020-07-02 DIAGNOSIS — K921 Melena: Secondary | ICD-10-CM | POA: Diagnosis not present

## 2020-07-02 DIAGNOSIS — E7800 Pure hypercholesterolemia, unspecified: Secondary | ICD-10-CM | POA: Diagnosis not present

## 2020-07-02 DIAGNOSIS — I4892 Unspecified atrial flutter: Secondary | ICD-10-CM | POA: Diagnosis not present

## 2020-07-05 NOTE — Telephone Encounter (Signed)
Discussed in OV with [facility] 07/01/20

## 2020-07-05 NOTE — Telephone Encounter (Signed)
Disability forms completed by Azalee Course ,PA-C ,and faxed to [facility] [place] on 07/05/20. Patient notified , wants a copy mailed to him. Mailed copy 07/05/20 fsw

## 2020-07-05 NOTE — Telephone Encounter (Signed)
FMLA forms completed by Azalee Course , PA-C ,and faxed to [employer] on 07/05/20. Patient notified , patient wants copy of forms mailed to him.mailed 07/05/20 fsw

## 2020-07-06 ENCOUNTER — Encounter: Payer: BC Managed Care – PPO | Admitting: *Deleted

## 2020-07-06 VITALS — BP 145/83 | HR 73 | Wt 303.0 lb

## 2020-07-06 DIAGNOSIS — Z006 Encounter for examination for normal comparison and control in clinical research program: Secondary | ICD-10-CM

## 2020-07-07 NOTE — Research (Addendum)
AEGIS Visit 6   Patient here today for visit 6. Patient doing well no complaints of chest pains or shortness of breath. TS saw patient for physical exam.                                   "CONSENT"   YES     NO   Continuing further Investigational Product and study visits for follow-up? [x]  []   Continuing consent from future biomedical research [x]  []                                    "EVENTS"    YES     NO  AE   (IF YES SEE SOURCE) []  [x]   SAE  (IF YES SEE SOURCE) []  [x]   ENDPOINT   (IF YES SEE SOURCE) []  [x]   REVASCULARIZATION  (IF YES SEE SOURCE) []  [x]   AMPUTATION   (IF YES SEE SOURCE) []  [x]   TROPONIN'S  (IF YES SEE SOURCE) []  [x]             Current Outpatient Medications:  .  acetaminophen (TYLENOL) 650 MG CR tablet, Take 1,300 mg by mouth every 8 (eight) hours as needed for pain., Disp: , Rfl:  .  albuterol (VENTOLIN HFA) 108 (90 Base) MCG/ACT inhaler, Inhale 2 puffs into the lungs every 6 (six) hours as needed for wheezing or shortness of breath., Disp: , Rfl:  .  apixaban (ELIQUIS) 5 MG TABS tablet, Take 1 tablet (5 mg total) by mouth 2 (two) times daily., Disp: 120 tablet, Rfl: 3 .  atorvastatin (LIPITOR) 80 MG tablet, Take 1 tablet (80 mg total) by mouth daily at 6 PM., Disp: 90 tablet, Rfl: 3 .  cetirizine (ZYRTEC) 10 MG tablet, Take 1 tablet (10 mg total) by mouth daily., Disp: 30 tablet, Rfl: 5 .  clopidogrel (PLAVIX) 75 MG tablet, Take 1 tablet (75 mg total) by mouth daily., Disp: 90 tablet, Rfl: 3 .  dapagliflozin propanediol (FARXIGA) 5 MG TABS tablet, Take 1 tablet (5 mg total) by mouth daily., Disp: 90 tablet, Rfl: 1 .  fluticasone (FLONASE) 50 MCG/ACT nasal spray, SHAKE LIQUID AND USE 1 SPRAY IN EACH NOSTRIL DAILY (Patient taking differently: Place 2 sprays into both nostrils daily as needed for allergies or rhinitis. ), Disp: 16 g, Rfl: 2 .  LANTUS SOLOSTAR 100 UNIT/ML Solostar Pen, Inject 55 Units into the skin daily after breakfast., Disp: 15 mL, Rfl: 11 .   magnesium citrate SOLN, Take 1 Bottle by mouth once as needed for mild constipation or moderate constipation. , Disp: , Rfl:  .  metoprolol succinate (TOPROL-XL) 25 MG 24 hr tablet, Take 1 tablet (25 mg total) by mouth daily., Disp: 90 tablet, Rfl: 3 .  nitroGLYCERIN (NITROSTAT) 0.4 MG SL tablet, Place 1 tablet (0.4 mg total) under the tongue every 5 (five) minutes as needed for chest pain., Disp: 25 tablet, Rfl: 3 .  pantoprazole (PROTONIX) 40 MG tablet, Take 40 mg by mouth 2 (two) times daily., Disp: , Rfl:  .  sacubitril-valsartan (ENTRESTO) 49-51 MG, Take 1 tablet by mouth 2 (two) times daily., Disp: 180 tablet, Rfl: 3

## 2020-07-13 ENCOUNTER — Ambulatory Visit: Payer: BC Managed Care – PPO | Admitting: Internal Medicine

## 2020-07-13 NOTE — Progress Notes (Deleted)
[name] Merritt    [id number]    1971-08-21  Primary Care Physician:Polite, Windy Fast, MD Date of Appointment: 07/13/2020 Established Patient Visit  Chief complaint:  Shortness of breath  HPI: [name] Merritt is a 49 y.o. gentleman with shortness of breath. At iniital visit symptoms concerning for asthma. PFTs were obtained, and he was given a trial of albuterol for dyspnea.   Interval Updates: Here for follow up today after sleep study. Albuterol is helping shortness of breath symptoms.   Drinks a lot of diet mountain dew. He is a third shift Financial controller.  Blood sugars down to 160-200 from 300-400. Seeing endocrinology soon.   He does snore. He does have spontaneous nocturnal awakenings.  He is tired during the day.   OBSTRUCTIVE SLEEP APNEA SCREENING  1.  Snoring?:  yes 2.  Tired?:  yes 3.  Observed apnea, stop breathing or choking/gasping during sleep?:  yes 4.  Pressure. HTN history?  yes 5.  BMI more than 35 kg/m2?  yes 6.  Age more than 50 yrs?  no 7.  Neck size larger than 17 in for male or 16 in for male?  yes 8.  Gender = Male?  yes  Total:  5  For general population  OSA - Low Risk : Yes to 0 - 2 questions OSA - Intermediate Risk : Yes to 3 - 4 questions OSA - High Risk : Yes to 5 - 8 questions  or Yes to 2 or more of 4 STOP questions + male gender or Yes to 2 or more of 4 STOP questions + BMI > 35kg/m2  or Yes to 2 or more of 4 STOP questions + neck circumference 17 inches / 43cm in male or 16 inches / 41cm in male  References: Landry Dyke al. Anesthesiology 2008; 108: 812-821,  Beecher Mcardle et al Br Michela Pitcher 2012; 108: 937-342,  Beecher Mcardle et al J Clin Sleep Med Sept 2014.   I have reviewed the patient's family social and past medical history and updated as appropriate.   Past Medical History:  Diagnosis Date  . Coronary artery disease   . Diabetes mellitus without complication (HCC)   . High cholesterol   . Hypertension   . Myocardial  infarction (HCC)   . New onset atrial flutter (HCC) 06/09/2020  . Overweight     Past Surgical History:  Procedure Laterality Date  . CARDIOVERSION N/A 06/10/2020   Procedure: CARDIOVERSION;  Surgeon: Little Ishikawa, MD;  Location: [facility] ENDOSCOPY;  Service: Cardiovascular;  Laterality: N/A;  . CORONARY STENT INTERVENTION N/A 05/30/2020   Procedure: CORONARY STENT INTERVENTION;  Surgeon: Lennette Bihari, MD;  Location: [facility] INVASIVE CV LAB;  Service: Cardiovascular;  Laterality: N/A;  . CORONARY/GRAFT ACUTE MI REVASCULARIZATION N/A 05/30/2020   Procedure: Coronary/Graft Acute MI Revascularization;  Surgeon: Lennette Bihari, MD;  Location: [facility] INVASIVE CV LAB;  Service: Cardiovascular;  Laterality: N/A;  . LEFT HEART CATH AND CORONARY ANGIOGRAPHY N/A 05/30/2020   Procedure: LEFT HEART CATH AND CORONARY ANGIOGRAPHY;  Surgeon: Lennette Bihari, MD;  Location: [facility] INVASIVE CV LAB;  Service: Cardiovascular;  Laterality: N/A;  . TEE WITHOUT CARDIOVERSION N/A 06/10/2020   Procedure: TRANSESOPHAGEAL ECHOCARDIOGRAM (TEE);  Surgeon: Little Ishikawa, MD;  Location: [facility] ENDOSCOPY;  Service: Cardiovascular;  Laterality: N/A;  . torn rotator cuff  2018   2018    Family History  Problem Relation Age of Onset  . Hypertension Mother   .  Diabetes Mother   . Hypertension Father   . Diabetes Father   . Diabetes Maternal Grandmother   . Hypertension Maternal Grandmother   . Diabetes Maternal Grandfather   . Hypertension Maternal Grandfather   . Diabetes Paternal Grandmother   . Hypertension Paternal Grandmother   . Diabetes Paternal Grandfather   . Hypertension Paternal Grandfather   . Asthma Neg Hx     Social History   Occupational History  . Occupation: maintenance  Tobacco Use  . Smoking status: Never Smoker  . Smokeless tobacco: Former Neurosurgeon    Types: Engineer, drilling  . Vaping Use: Never used  Substance and Sexual Activity  . Alcohol use: Yes    Comment: occasionally  . Drug use:  Not Currently    Types: Marijuana    Comment: quit 10-15 years ago  . Sexual activity: Not on file     Physical Exam: There were no vitals taken for this visit.  Gen:      No acute distress ENT:  no nasal polyps, mucus membranes moist +cobblestoning, mallampati IV Lungs:    No increased respiratory effort, symmetric chest wall excursion, clear to auscultation bilaterally, no wheezes or crackles CV:         Regular rate and rhythm; no murmurs, rubs, or gallops.  No pedal edema   Data Reviewed: Imaging: CT Angio march 2021 demonstrates low lung volumes. No PE. No pneumonia or effusion.   PFTs: Spirometry on 03/12/2020 demonstrates no airflow limitation. No significant BD response. FENO 19 ppb  HST 04/28/20 >> AHI 15.4, SpO2 low 74%.  Spent 54.4 min of test time with SpO2 < 89%.  Labs: Lab Results  Component Value Date   WBC 4.7 07/01/2020   HGB 13.6 07/01/2020   HCT 41.1 07/01/2020   MCV 92 07/01/2020   PLT 266 07/01/2020   Lab Results  Component Value Date   NA 139 07/01/2020   K 5.1 07/01/2020   CL 102 07/01/2020   CO2 24 07/01/2020     Immunization status: Immunization History  Administered Date(s) Administered  . Influenza,inj,Quad PF,6+ Mos 08/19/2019  . PFIZER SARS-COV-2 Vaccination 01/05/2020, 01/26/2020    Assessment:  Shortness of breath OSA - new diagnosis. AHI 15.4, SpO2 low 74%. Chronic Cough - suspect uncontrolled GERD   Plan/Recommendations: Suspect ongoing cough worse from reflux. Continue PPI.  Sleep with wedge pillow, needs to cut back on caffeine and carbonated beverages.  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 However high risk for OSA. Will plan for HSAT.  Discussed weight loss for a while as well today. He is following up with endocrinology to get his DM under better control as well.   Can continue prn albuterol for now - although I suspect his weight and untreated OSA are probably playing more into his symptoms.   Return to  Care: No follow-ups on file.   Durel Salts, MD Pulmonary and Critical Care Medicine [facility] [phone]

## 2020-07-15 ENCOUNTER — Encounter: Payer: BC Managed Care – PPO | Admitting: Physician Assistant

## 2020-07-17 NOTE — Progress Notes (Signed)
This encounter was created in error - please disregard.

## 2020-07-27 ENCOUNTER — Encounter: Payer: Self-pay | Admitting: *Deleted

## 2020-07-29 ENCOUNTER — Ambulatory Visit: Payer: Self-pay | Admitting: Endocrinology

## 2020-07-29 ENCOUNTER — Other Ambulatory Visit: Payer: Self-pay

## 2020-07-29 DIAGNOSIS — E785 Hyperlipidemia, unspecified: Secondary | ICD-10-CM | POA: Diagnosis not present

## 2020-07-30 LAB — LIPID PANEL
Chol/HDL Ratio: 3.9 ratio (ref 0.0–5.0)
Cholesterol, Total: 147 mg/dL (ref 100–199)
HDL: 38 mg/dL — ABNORMAL LOW (ref >39)
LDL Chol Calc (NIH): 96 mg/dL (ref 0–99)
Triglycerides: 66 mg/dL (ref 0–149)
VLDL Cholesterol Cal: 13 mg/dL (ref 5–40)

## 2020-07-30 LAB — HEPATIC FUNCTION PANEL
ALT: 13 IU/L (ref 0–44)
AST: 15 IU/L (ref 0–40)
Albumin: 4.3 g/dL (ref 4.0–5.0)
Alkaline Phosphatase: 77 IU/L (ref 44–121)
Bilirubin Total: 0.6 mg/dL (ref 0.0–1.2)
Bilirubin, Direct: 0.18 mg/dL (ref 0.00–0.40)
Total Protein: 6.9 g/dL (ref 6.0–8.5)

## 2020-08-03 ENCOUNTER — Telehealth: Payer: Self-pay

## 2020-08-03 NOTE — Telephone Encounter (Signed)
Received a fax from [employer]. It state they were notified by patient that he experienced an adverse event while taking entresto.   Nurse contacted pt. Pt state he is still taking Entresto and has never experienced an adverse reaction to the medication. Will fax message to [employer].

## 2020-08-04 ENCOUNTER — Telehealth: Payer: Self-pay | Admitting: *Deleted

## 2020-08-04 NOTE — Telephone Encounter (Signed)
LMTCB for AEGIS Visit 7 phone call

## 2020-08-05 ENCOUNTER — Encounter: Payer: Self-pay | Admitting: *Deleted

## 2020-08-05 DIAGNOSIS — Z006 Encounter for examination for normal comparison and control in clinical research program: Secondary | ICD-10-CM

## 2020-08-05 NOTE — Research (Signed)
  Patient is doing well and has had no chest pain or SHOB. He has been compliant with all his meds. Reports no visits to ER, UC or hospital since last Aegis visit.                                     "CONSENT"   YES     NO   Continuing further Investigational Product and study visits for follow-up? [x]  []   Continuing consent from future biomedical research [x]  []                                      "EVENTS"    YES     NO  AE   (IF YES SEE SOURCE) []  [x]   SAE  (IF YES SEE SOURCE) []  [x]   ENDPOINT   (IF YES SEE SOURCE) []  [x]   REVASCULARIZATION  (IF YES SEE SOURCE) []  [x]   AMPUTATION   (IF YES SEE SOURCE) []  [x]   TROPONIN'S  (IF YES SEE SOURCE) []  [x]    Lifestyle Adherence Assessment:    YES NO  Abstinence from smoking/remaining tobacco free x   Cardiac Diet x   Routine physical activity and/or cardiac rehabilitation x     Current Outpatient Medications:  .  acetaminophen (TYLENOL) 650 MG CR tablet, Take 1,300 mg by mouth every 8 (eight) hours as needed for pain., Disp: , Rfl:  .  albuterol (VENTOLIN HFA) 108 (90 Base) MCG/ACT inhaler, Inhale 2 puffs into the lungs every 6 (six) hours as needed for wheezing or shortness of breath., Disp: , Rfl:  .  apixaban (ELIQUIS) 5 MG TABS tablet, Take 1 tablet (5 mg total) by mouth 2 (two) times daily., Disp: 120 tablet, Rfl: 3 .  atorvastatin (LIPITOR) 80 MG tablet, Take 1 tablet (80 mg total) by mouth daily at 6 PM., Disp: 90 tablet, Rfl: 3 .  cetirizine (ZYRTEC) 10 MG tablet, Take 1 tablet (10 mg total) by mouth daily., Disp: 30 tablet, Rfl: 5 .  clopidogrel (PLAVIX) 75 MG tablet, Take 1 tablet (75 mg total) by mouth daily., Disp: 90 tablet, Rfl: 3 .  dapagliflozin propanediol (FARXIGA) 5 MG TABS tablet, Take 1 tablet (5 mg total) by mouth daily., Disp: 90 tablet, Rfl: 1 .  fluticasone (FLONASE) 50 MCG/ACT nasal spray, SHAKE LIQUID AND USE 1 SPRAY IN EACH NOSTRIL DAILY (Patient taking differently: Place 2 sprays into both nostrils daily as  needed for allergies or rhinitis. ), Disp: 16 g, Rfl: 2 .  LANTUS SOLOSTAR 100 UNIT/ML Solostar Pen, Inject 55 Units into the skin daily after breakfast., Disp: 15 mL, Rfl: 11 .  magnesium citrate SOLN, Take 1 Bottle by mouth once as needed for mild constipation or moderate constipation. , Disp: , Rfl:  .  metoprolol succinate (TOPROL-XL) 25 MG 24 hr tablet, Take 1 tablet (25 mg total) by mouth daily., Disp: 90 tablet, Rfl: 3 .  nitroGLYCERIN (NITROSTAT) 0.4 MG SL tablet, Place 1 tablet (0.4 mg total) under the tongue every 5 (five) minutes as needed for chest pain., Disp: 25 tablet, Rfl: 3 .  pantoprazole (PROTONIX) 40 MG tablet, Take 40 mg by mouth 2 (two) times daily., Disp: , Rfl:  .  sacubitril-valsartan (ENTRESTO) 49-51 MG, Take 1 tablet by mouth 2 (two) times daily., Disp: 180 tablet, Rfl: 3

## 2020-08-06 ENCOUNTER — Other Ambulatory Visit: Payer: Self-pay

## 2020-08-06 DIAGNOSIS — E785 Hyperlipidemia, unspecified: Secondary | ICD-10-CM

## 2020-08-15 ENCOUNTER — Other Ambulatory Visit: Payer: Self-pay | Admitting: Internal Medicine

## 2020-08-15 DIAGNOSIS — R053 Chronic cough: Secondary | ICD-10-CM

## 2020-09-01 ENCOUNTER — Encounter: Payer: BC Managed Care – PPO | Admitting: *Deleted

## 2020-09-01 VITALS — BP 155/81 | HR 58 | Wt 314.0 lb

## 2020-09-01 DIAGNOSIS — Z006 Encounter for examination for normal comparison and control in clinical research program: Secondary | ICD-10-CM

## 2020-09-01 NOTE — Research (Signed)
AEGIS V8  Patient is doing well, no complaints of chest pain or shortness of breath. No med changes. Patient is back at work, working third shifts.                                   "CONSENT"   YES     NO   Continuing further Investigational Product and study visits for follow-up? [x]  []   Continuing consent from future biomedical research [x]  []                                    "EVENTS"    YES     NO  AE   (IF YES SEE SOURCE) []  [x]   SAE  (IF YES SEE SOURCE) []  [x]   ENDPOINT   (IF YES SEE SOURCE) []  [x]   REVASCULARIZATION  (IF YES SEE SOURCE) []  [x]   AMPUTATION   (IF YES SEE SOURCE) []  [x]   TROPONIN'S  (IF YES SEE SOURCE) []  [x]    Lifestyle Adherence Assessment:    YES NO  Abstinence from smoking/remaining tobacco free X   Cardiac Diet X   Routine physical activity and/or cardiac rehabilitation X    [id number]  MOBILITY:    I HAVE NO PROBLEMS WALKING [x]   I HAVE SLIGHT PROBLEMS WALKING []   I HAVE MODERATE PROBLEMS WALKING []   I HAVE SEVERE PROBLEMS WALKING []   I AM UNABLE TO WALK  []     SELF-CARE:   I HAVE NO PROBLEMS WASING OR DRESSING MYSELF  [x]   I HAVE SLIGHT PROBLEMS WASHING OR DRESSING MYSELF  []   I HAVE MODERATE PROBLEMS WASHING OR DRESSING MYSELF []   I HAVE SEVERE PROBLEMS WASHING OR DRESSING MYSELF  []   I HAVE SEVERE PROBLEMS WASHING OR DRESSING MYSELF  []   I AM UNABLE TO WASH OR DRESS MYSELF []     USUAL ACTIVITIES: (E.G. WORK/STUDY/HOUSEWORK/FAMILY OR LEISURE ACTIVITIES.    I HAVE NO PROBLEMS DOING MY USUAL ACTIVITIES [x]   I HAVE SLIGHT PROBLEMS DOING MY USUAL ACTIVITIES []   I HAVE MODERATE PROBLEMS DOING MY USUAL ACTIVIITIES []   I HAVE SEVERE PROBLEMS DOING MY USUAL ACTIVITIES []   I AM UNABLE TO DO MY USUAL ACTIVITIES []     PAIN /DISCOMFORT   I HAVE NO PAIN OR DISCOMFORT [x]   I HAVE SLIGHT PAIN OR DISCOMFORT []   I HAVE MODERATE PAIN OR DISCOMFORT []   I HAVE SEVERE PAIN OR DISCOMFORT []   I HAVE EXTREME PAIN OR DISCOMFORT []     ANXIETY/DEPRESSION   I AM  NOT ANXIOUS OR DEPRESSED [x]   I AM SLIGHTLY ANXIOUS OR DEPRESSED []   I AM MODERATELY ANXIOUS OR DREPRESSED []   I AM SEVERELY ANXIOUS OR DEPRESSED []   I AM EXTREMELY ANXIOUS OR DEPRESSED []     SCALE OF 0-100 HOW WOULD YOU RATE TODAY?  0 IS THE WORSE AND 100 IS THE BEST HEALTH YOU CAN IMAGINE: 93    Current Outpatient Medications:    acetaminophen (TYLENOL) 650 MG CR tablet, Take 1,300 mg by mouth every 8 (eight) hours as needed for pain., Disp: , Rfl:    albuterol (VENTOLIN HFA) 108 (90 Base) MCG/ACT inhaler, Inhale 2 puffs into the lungs every 6 (six) hours as needed for wheezing or shortness of breath., Disp: , Rfl:    ALPRAZolam (XANAX) 0.25 MG tablet, Take 0.25 mg by mouth daily as needed., Disp: , Rfl:  apixaban (ELIQUIS) 5 MG TABS tablet, Take 1 tablet (5 mg total) by mouth 2 (two) times daily., Disp: 120 tablet, Rfl: 3   atorvastatin (LIPITOR) 80 MG tablet, Take 1 tablet (80 mg total) by mouth daily at 6 PM., Disp: 90 tablet, Rfl: 3   cetirizine (ZYRTEC) 10 MG tablet, TAKE 1 TABLET(10 MG) BY MOUTH DAILY, Disp: 30 tablet, Rfl: 5   clopidogrel (PLAVIX) 75 MG tablet, Take 1 tablet (75 mg total) by mouth daily., Disp: 90 tablet, Rfl: 3   dapagliflozin propanediol (FARXIGA) 5 MG TABS tablet, Take 1 tablet (5 mg total) by mouth daily., Disp: 90 tablet, Rfl: 1   fluticasone (FLONASE) 50 MCG/ACT nasal spray, SHAKE LIQUID AND USE 1 SPRAY IN EACH NOSTRIL DAILY (Patient taking differently: Place 2 sprays into both nostrils daily as needed for allergies or rhinitis. ), Disp: 16 g, Rfl: 2   LANTUS SOLOSTAR 100 UNIT/ML Solostar Pen, Inject 55 Units into the skin daily after breakfast., Disp: 15 mL, Rfl: 11   magnesium citrate SOLN, Take 1 Bottle by mouth once as needed for mild constipation or moderate constipation. , Disp: , Rfl:    metoprolol succinate (TOPROL-XL) 25 MG 24 hr tablet, Take 1 tablet (25 mg total) by mouth daily., Disp: 90 tablet, Rfl: 3   nitroGLYCERIN (NITROSTAT) 0.4  MG SL tablet, Place 1 tablet (0.4 mg total) under the tongue every 5 (five) minutes as needed for chest pain., Disp: 25 tablet, Rfl: 3   pantoprazole (PROTONIX) 40 MG tablet, Take 40 mg by mouth 2 (two) times daily., Disp: , Rfl:    sacubitril-valsartan (ENTRESTO) 49-51 MG, Take 1 tablet by mouth 2 (two) times daily., Disp: 180 tablet, Rfl: 3

## 2020-09-06 MED FILL — METOPROLOL SUCCINATE ER 25: 25 | 90 days supply | Qty: 90 | Fill #0

## 2020-09-06 MED FILL — ELIQUIS 5 MG TABLET: 5 | 30 days supply | Qty: 60 | Fill #0

## 2020-09-06 MED FILL — CLOPIDOGREL 75 MG TABLET: 75 | 90 days supply | Qty: 90 | Fill #0

## 2020-09-20 MED FILL — ATORVASTATIN 80 MG TABLET: 80 | 30 days supply | Qty: 30 | Fill #0

## 2020-09-22 ENCOUNTER — Other Ambulatory Visit (HOSPITAL_COMMUNITY): Payer: Self-pay | Admitting: Internal Medicine

## 2020-09-22 MED FILL — BASAGLAR 100 UNIT/ML KWIKPE: 100 | 82 days supply | Qty: 45 | Fill #0

## 2020-09-27 ENCOUNTER — Other Ambulatory Visit (HOSPITAL_COMMUNITY): Payer: Self-pay | Admitting: Physician Assistant

## 2020-09-27 MED FILL — ENTRESTO 49 MG-51 MG TABLET: 49-51 | 90 days supply | Qty: 180 | Fill #0

## 2020-09-27 MED FILL — FARXIGA 5 MG TABLET: 5 | 90 days supply | Qty: 90 | Fill #0

## 2020-09-28 ENCOUNTER — Telehealth: Payer: Self-pay | Admitting: Internal Medicine

## 2020-09-29 ENCOUNTER — Other Ambulatory Visit: Payer: Self-pay | Admitting: Internal Medicine

## 2020-09-29 ENCOUNTER — Ambulatory Visit: Payer: BC Managed Care – PPO | Admitting: Cardiovascular Disease

## 2020-09-29 MED ORDER — PANTOPRAZOLE SODIUM 40 MG PO TBEC
40.0000 mg | DELAYED_RELEASE_TABLET | Freq: Two times a day (BID) | ORAL | 5 refills | Status: DC
Start: 2020-09-29 — End: 2020-12-27

## 2020-09-29 MED FILL — PANTOPRAZOLE SOD DR 40 MG T: 40 | 30 days supply | Qty: 60 | Fill #0

## 2020-09-29 NOTE — Telephone Encounter (Signed)
Rx for pantoprazole has been sent to preferred pharmacy for pt. Called and spoke with [facility]) letting her know that the rx had been sent to pharmacy for pt and she verbalized understanding. Nothing further needed.

## 2020-10-04 MED FILL — ELIQUIS 5 MG TABLET: 5 | 30 days supply | Qty: 60 | Fill #1

## 2020-10-25 MED FILL — PANTOPRAZOLE SOD DR 40 MG T: 40 | 30 days supply | Qty: 60 | Fill #1

## 2020-10-25 MED FILL — ATORVASTATIN 80 MG TABLET: 80 | 30 days supply | Qty: 30 | Fill #1

## 2020-10-29 MED FILL — ALBUTEROL SULFATE HFA 108 (: 108 (90 BAS | 25 days supply | Qty: 18 | Fill #0

## 2020-11-08 MED FILL — ELIQUIS 5 MG TABLET: 5 | 30 days supply | Qty: 60 | Fill #2

## 2020-11-11 ENCOUNTER — Telehealth: Payer: Self-pay | Admitting: Cardiovascular Disease

## 2020-11-11 NOTE — Telephone Encounter (Signed)
1. What dental office are you calling from?  Dr Thayer Ohm Castor,DDS  2. What is your office phone number? [phone] 3. What is your fax number?[phone] 4.  5. What type of procedure is the patient having performed? Cleaning this morning 6.  7. What date is procedure scheduled or is the patient there now? 11-11-20  (if the patient is at the dentist's office question goes to their cardiologist if he/she is in the office.  If not, question should go to the DOD).   8. What is your question (ex. Antibiotics prior to procedure, holding medication-we need to know how long dentist wants pt to hold med)?  Does pt need to be pre-medicated before

## 2020-11-11 NOTE — Telephone Encounter (Signed)
   Primary Cardiologist: Nicki Guadalajara, MD  Chart reviewed as part of pre-operative protocol coverage.    Simple dental extractions are considered low risk procedures per guidelines and generally do not require any specific cardiac clearance. It is also generally accepted that for simple extractions and dental cleanings, there is no need to interrupt blood thinner therapy.  SBE prophylaxis is not required for the patient from a cardiac standpoint.  I will route this recommendation to the requesting party via Epic fax function and remove from pre-op pool.  Please call with questions.  Alver Sorrow, NP 11/11/2020, 8:59 AM

## 2020-11-12 ENCOUNTER — Other Ambulatory Visit (HOSPITAL_COMMUNITY): Payer: Self-pay | Admitting: Oral Surgery

## 2020-11-12 MED FILL — IBUPROFEN 800 MG TABS: 800 | 5 days supply | Qty: 20 | Fill #0

## 2020-11-17 ENCOUNTER — Ambulatory Visit: Payer: BC Managed Care – PPO | Admitting: Cardiovascular Disease

## 2020-11-29 ENCOUNTER — Ambulatory Visit: Payer: Self-pay | Admitting: Internal Medicine

## 2020-11-29 MED FILL — PANTOPRAZOLE SOD DR 40 MG T: 40 | 30 days supply | Qty: 60 | Fill #2

## 2020-11-29 MED FILL — ATORVASTATIN 80 MG TABLET: 80 | 30 days supply | Qty: 30 | Fill #2

## 2020-12-06 MED FILL — CLOPIDOGREL 75 MG TABLET: 75 | 90 days supply | Qty: 90 | Fill #1

## 2020-12-06 MED FILL — METOPROLOL SUCCINATE ER 25: 25 | 90 days supply | Qty: 90 | Fill #1

## 2020-12-07 ENCOUNTER — Encounter: Payer: Self-pay | Admitting: *Deleted

## 2020-12-07 DIAGNOSIS — Z006 Encounter for examination for normal comparison and control in clinical research program: Secondary | ICD-10-CM

## 2020-12-07 NOTE — Research (Signed)
AEGIS V9  Patient doing well, no complaitns of chest pain or shortness of breath. He is working for himself now. No med changes.                                    "CONSENT"   YES     NO   Continuing further Investigational Product and study visits for follow-up? [x]  []   Continuing consent from future biomedical research [x]  []                                    "EVENTS"    YES     NO  AE   (IF YES SEE SOURCE) []  [x]   SAE  (IF YES SEE SOURCE) []  [x]   ENDPOINT   (IF YES SEE SOURCE) []  [x]   REVASCULARIZATION  (IF YES SEE SOURCE) []  [x]   AMPUTATION   (IF YES SEE SOURCE) []  [x]   TROPONIN'S  (IF YES SEE SOURCE) []  [x]    Lifestyle Adherence Assessment:   YES NO  Abstinence from smoking/remaining tobacco free X   Cardiac Diet X   Routine physical activity and/or cardiac rehabilitation X     Current Outpatient Medications:  .  acetaminophen (TYLENOL) 650 MG CR tablet, Take 1,300 mg by mouth every 8 (eight) hours as needed for pain., Disp: , Rfl:  .  albuterol (VENTOLIN HFA) 108 (90 Base) MCG/ACT inhaler, Inhale 2 puffs into the lungs every 6 (six) hours as needed for wheezing or shortness of breath., Disp: , Rfl:  .  ALPRAZolam (XANAX) 0.25 MG tablet, Take 0.25 mg by mouth daily as needed., Disp: , Rfl:  .  apixaban (ELIQUIS) 5 MG TABS tablet, Take 1 tablet (5 mg total) by mouth 2 (two) times daily., Disp: 120 tablet, Rfl: 3 .  atorvastatin (LIPITOR) 80 MG tablet, Take 1 tablet (80 mg total) by mouth daily at 6 PM., Disp: 90 tablet, Rfl: 3 .  cetirizine (ZYRTEC) 10 MG tablet, TAKE 1 TABLET(10 MG) BY MOUTH DAILY, Disp: 30 tablet, Rfl: 5 .  clopidogrel (PLAVIX) 75 MG tablet, Take 1 tablet (75 mg total) by mouth daily., Disp: 90 tablet, Rfl: 3 .  dapagliflozin propanediol (FARXIGA) 5 MG TABS tablet, Take 1 tablet (5 mg total) by mouth daily., Disp: 90 tablet, Rfl: 1 .  fluticasone (FLONASE) 50 MCG/ACT nasal spray, SHAKE LIQUID AND USE 1 SPRAY IN EACH NOSTRIL DAILY (Patient taking differently:  Place 2 sprays into both nostrils daily as needed for allergies or rhinitis.), Disp: 16 g, Rfl: 2 .  LANTUS SOLOSTAR 100 UNIT/ML Solostar Pen, Inject 55 Units into the skin daily after breakfast., Disp: 15 mL, Rfl: 11 .  magnesium citrate SOLN, Take 1 Bottle by mouth once as needed for mild constipation or moderate constipation. , Disp: , Rfl:  .  metoprolol succinate (TOPROL-XL) 25 MG 24 hr tablet, Take 1 tablet (25 mg total) by mouth daily., Disp: 90 tablet, Rfl: 3 .  nitroGLYCERIN (NITROSTAT) 0.4 MG SL tablet, Place 1 tablet (0.4 mg total) under the tongue every 5 (five) minutes as needed for chest pain., Disp: 25 tablet, Rfl: 3 .  pantoprazole (PROTONIX) 40 MG tablet, Take 1 tablet (40 mg total) by mouth 2 (two) times daily., Disp: 30 tablet, Rfl: 5 .  sacubitril-valsartan (ENTRESTO) 49-51 MG, Take 1 tablet by mouth 2 (two) times daily., Disp: 180 tablet,  Rfl: 3

## 2020-12-13 MED FILL — ELIQUIS 5 MG TABLET: 5 | 30 days supply | Qty: 60 | Fill #3

## 2020-12-13 MED FILL — BASAGLAR 100 UNIT/ML KWIKPE: 100 | 55 days supply | Qty: 30 | Fill #1

## 2020-12-27 ENCOUNTER — Other Ambulatory Visit: Payer: Self-pay | Admitting: Internal Medicine

## 2020-12-27 MED FILL — FARXIGA 5 MG TABLET: 5 | 90 days supply | Qty: 90 | Fill #0

## 2020-12-27 MED FILL — ATORVASTATIN 80 MG TABLET: 80 | 30 days supply | Qty: 30 | Fill #3

## 2020-12-27 MED FILL — PANTOPRAZOLE SOD DR 40 MG T: 40 | 30 days supply | Qty: 60 | Fill #0

## 2020-12-27 MED FILL — ENTRESTO 49 MG-51 MG TABLET: 49-51 | 90 days supply | Qty: 180 | Fill #1

## 2021-01-04 NOTE — Research (Signed)
Seen and examined with Ms. Lutterloh as part of patient visit 6 Aegis II trial.  Patient known to me from care of his father.  As noted, patient doing well at this visit.  No chest pain or shortness of breath.  Just discharged 3-4 weeks ago who was readmitted after STEMI discharge for atrial flutter. On Eliquis and clopidogrel alone.  Seen a few days earlier in CV OP clinic. NSR at that time.   Regular rhythm Chronic cough with minimal prolonged expiration.  No rales Cardiac rhythm regular.  No murmur or gallop Ext no present edema

## 2021-01-10 ENCOUNTER — Other Ambulatory Visit: Payer: Self-pay | Admitting: Physician Assistant

## 2021-01-10 ENCOUNTER — Other Ambulatory Visit: Payer: Self-pay | Admitting: Cardiology

## 2021-01-10 MED FILL — ELIQUIS 5 MG TABLET: 5 | 90 days supply | Qty: 180 | Fill #0

## 2021-01-10 NOTE — Telephone Encounter (Signed)
62m, 137.9kg, scr 1.08 07/01/20, lovw/meng 07/01/20

## 2021-01-22 ENCOUNTER — Other Ambulatory Visit (HOSPITAL_COMMUNITY): Payer: Self-pay

## 2021-01-28 ENCOUNTER — Encounter: Payer: Self-pay | Admitting: Cardiovascular Disease

## 2021-01-28 ENCOUNTER — Ambulatory Visit (INDEPENDENT_AMBULATORY_CARE_PROVIDER_SITE_OTHER): Payer: 59 | Admitting: Cardiovascular Disease

## 2021-01-28 ENCOUNTER — Other Ambulatory Visit: Payer: Self-pay | Admitting: Cardiovascular Disease

## 2021-01-28 ENCOUNTER — Other Ambulatory Visit: Payer: Self-pay

## 2021-01-28 ENCOUNTER — Other Ambulatory Visit (HOSPITAL_COMMUNITY): Payer: Self-pay

## 2021-01-28 DIAGNOSIS — E785 Hyperlipidemia, unspecified: Secondary | ICD-10-CM

## 2021-01-28 DIAGNOSIS — I10 Essential (primary) hypertension: Secondary | ICD-10-CM

## 2021-01-28 DIAGNOSIS — G4733 Obstructive sleep apnea (adult) (pediatric): Secondary | ICD-10-CM | POA: Diagnosis not present

## 2021-01-28 DIAGNOSIS — I2102 ST elevation (STEMI) myocardial infarction involving left anterior descending coronary artery: Secondary | ICD-10-CM

## 2021-01-28 DIAGNOSIS — Z7901 Long term (current) use of anticoagulants: Secondary | ICD-10-CM | POA: Diagnosis not present

## 2021-01-28 DIAGNOSIS — I252 Old myocardial infarction: Secondary | ICD-10-CM

## 2021-01-28 DIAGNOSIS — I4892 Unspecified atrial flutter: Secondary | ICD-10-CM | POA: Diagnosis not present

## 2021-01-28 DIAGNOSIS — I255 Ischemic cardiomyopathy: Secondary | ICD-10-CM | POA: Diagnosis not present

## 2021-01-28 DIAGNOSIS — I2510 Atherosclerotic heart disease of native coronary artery without angina pectoris: Secondary | ICD-10-CM

## 2021-01-28 MED ORDER — SPIRONOLACTONE 25 MG PO TABS
12.5000 mg | ORAL_TABLET | Freq: Every day | ORAL | 1 refills | Status: DC
  Filled 2021-01-28: qty 45, 90d supply, fill #0
  Filled 2021-04-20: qty 45, 90d supply, fill #1
  Filled 2021-08-04: qty 45, 90d supply, fill #2
  Filled 2021-08-04: qty 30, 60d supply, fill #2

## 2021-01-28 MED ORDER — SACUBITRIL-VALSARTAN 97-103 MG PO TABS
1.0000 | ORAL_TABLET | Freq: Two times a day (BID) | ORAL | 3 refills | Status: DC
  Filled 2021-01-28: qty 60, 30d supply, fill #0
  Filled 2021-03-09: qty 60, 30d supply, fill #1
  Filled 2021-04-20: qty 60, 30d supply, fill #2
  Filled 2021-05-25: qty 60, 30d supply, fill #3

## 2021-01-28 NOTE — Progress Notes (Signed)
Cardiology Office Note    Date:  01/30/2021   ID:  [name] Merritt, DOB 1971-06-01, MRN [id number]  PCP:  Seward Carol, MD  Cardiologist:  Shelva Majestic, MD   Initial office visit with me following his STEMI in August 2021  Chief Complaint  Patient presents with  . Follow-up    2-3 months.  . Chest Pain  . Headache  . Shortness of Breath  . Edema    History of Present Illness:  [name] Merritt is a 50 y.o. male who has a history of obesity, hypertension, hyperlipidemia, insulin-dependent diabetes mellitus, as well as diagnosed, but not treated sleep apnea.  On May 29, 2020 he experienced recurrent intermittent episodes of chest tightness for the majority of the day.  His symptoms ultimately resolved.  In the early morning at 5 AM on that evening he developed severe chest pain and presented to the emergency room where ECG showed acute anterolateral ST segment elevation.  A code STEMI was activated.  He was taken emergently to the catheterization laboratory and was found to have total occlusion of the mid LAD with TIMI 0 flow and absence of collaterals.  There was concomitant CAD with tortuous first diagonal vessel with 70% narrowing beyond a sharp bend, 20% narrowing beyond a sharp bend in the ramus immediate vessel, 50% focal OM 3 stenosis in a dominant left circumflex coronary artery and 70% focal stenosis in the midportion of a small PLA branch.  He had a normal small nondominant RCA.  He underwent successful PCI to the mid LAD with ultimate insertion of a Resolute Onyx 3.0 x 22 mm stent postdilated to 3.25 mm with restoration of TIMI-3 flow. I had not seen him since his initial acute catheterization and intervention.  An echo Doppler study on January 30, 2020 showed an EF of 35 to 40% with akinesis of the mid septum extending to the apex consistent with his LAD infarction.  He had grade 2 diastolic dysfunction.  He was initially placed on metoprolol which was switched to carvedilol.  He  was started on Entresto and high intensity statin therapy.  He also was enrolled in the AEGIS trial.  When he presented for his infusion as part of the AEGIS  Trial on June 08, 2020 he was noted to be in atrial flutter.  Earlier he had taken Sudafed for nasal congestion.  He was subsequently sent to the emergency room.  Carvedilol was switched back to metoprolol for improved rate control, he was started on Eliquis and Brilinta was changed to Plavix.  He underwent TEE DC cardioversion by Dr. Gilman Schmidt on June 10, 2020 and post procedure he was discharged on Plavix/Eliquis.  He was seen by Almyra Deforest, PA on July 01, 2020.  At that time, he was was stable and did not have any recurrent chest pain.  He was maintaining sinus rhythm.  With his echo Doppler showing EF of 35 to 40% Entresto was increased to 49/51 mg twice a day.  He was on insulin for diabetes mellitus.  Of note, prior to his initial MI, he had undergone a home sleep study on April 28, 2020 which demonstrated moderate overall sleep apnea with an AHI of 15.4 but he had significant oxygen desaturation to a nadir of 74%.  He spent 54.4 minutes of the test with saturation less than 89%.  Apparently there was never any follow-up of this and he never had been contacted by pulmonary for CPAP titration.    Presently, he denies  any recurrent chest pain.  He continues to be on Entresto 49/51 mg twice a day, Farxiga 5 mg, metoprolol succinate 25 mg daily for his cardiomyopathy.  He is on Plavix and Eliquis for anticoagulation.  He is on atorvastatin 80 mg daily for hyperlipidemia.  He continues to be on Lantus insulin.  He presents for his initial evaluation with me.   Past Medical History:  Diagnosis Date  . Coronary artery disease   . Diabetes mellitus without complication ([place])   . High cholesterol   . Hypertension   . Myocardial infarction ([place])   . New onset atrial flutter ([place]) 06/09/2020  . Overweight     Past Surgical History:  Procedure  Laterality Date  . CARDIOVERSION N/A 06/10/2020   Procedure: CARDIOVERSION;  Surgeon: Donato Heinz, MD;  Location: [facility] ENDOSCOPY;  Service: Cardiovascular;  Laterality: N/A;  . CORONARY STENT INTERVENTION N/A 05/30/2020   Procedure: CORONARY STENT INTERVENTION;  Surgeon: Troy Sine, MD;  Location: [place] CV LAB;  Service: Cardiovascular;  Laterality: N/A;  . CORONARY/GRAFT ACUTE MI REVASCULARIZATION N/A 05/30/2020   Procedure: Coronary/Graft Acute MI Revascularization;  Surgeon: Troy Sine, MD;  Location: [place] CV LAB;  Service: Cardiovascular;  Laterality: N/A;  . LEFT HEART CATH AND CORONARY ANGIOGRAPHY N/A 05/30/2020   Procedure: LEFT HEART CATH AND CORONARY ANGIOGRAPHY;  Surgeon: Troy Sine, MD;  Location: [place] CV LAB;  Service: Cardiovascular;  Laterality: N/A;  . TEE WITHOUT CARDIOVERSION N/A 06/10/2020   Procedure: TRANSESOPHAGEAL ECHOCARDIOGRAM (TEE);  Surgeon: Donato Heinz, MD;  Location: [place];  Service: Cardiovascular;  Laterality: N/A;  . torn rotator cuff  2018   2018    Current Medications: Outpatient Medications Prior to Visit  Medication Sig Dispense Refill  . acetaminophen (TYLENOL) 650 MG CR tablet Take 1,300 mg by mouth every 8 (eight) hours as needed for pain.    Marland Kitchen albuterol (VENTOLIN HFA) 108 (90 Base) MCG/ACT inhaler Inhale 2 puffs into the lungs every 6 (six) hours as needed for wheezing or shortness of breath.    . ALPRAZolam (XANAX) 0.25 MG tablet Take 0.25 mg by mouth daily as needed.    Marland Kitchen apixaban (ELIQUIS) 5 MG TABS tablet TAKE 1 TABLET BY MOUTH TWO TIMES DAILY 180 tablet 1  . atorvastatin (LIPITOR) 80 MG tablet Take 1 tablet (80 mg total) by mouth daily at 6 PM. 90 tablet 3  . atorvastatin (LIPITOR) 80 MG tablet TAKE 1 TABLET (80 MG TOTAL) BY MOUTH DAILY AT 6 PM. (Patient taking differently: Take by mouth daily. at 6pm) 90 tablet 3  . cetirizine (ZYRTEC) 10 MG tablet TAKE 1 TABLET(10 MG) BY MOUTH DAILY 30 tablet  5  . clopidogrel (PLAVIX) 75 MG tablet TAKE 1 TABLET BY MOUTH ONCE DAILY 90 tablet 2  . dapagliflozin propanediol (FARXIGA) 5 MG TABS tablet Take 1 tablet (5 mg total) by mouth daily. 90 tablet 1  . dapagliflozin propanediol (FARXIGA) 5 MG TABS tablet TAKE 1 TABLET BY MOUTH ONCE DAILY. 90 tablet 0  . fluticasone (FLONASE) 50 MCG/ACT nasal spray SHAKE LIQUID AND USE 1 SPRAY IN EACH NOSTRIL DAILY (Patient taking differently: Place 2 sprays into both nostrils daily as needed for allergies or rhinitis.) 16 g 2  . ibuprofen (ADVIL) 800 MG tablet TAKE 1 TABLET BY MOUTH EVERY 6 HOURS AS NEEDED FOR PAIN (Patient taking differently: Take by mouth every 6 (six) hours as needed. for pain) 20 tablet 0  . Insulin Glargine (BASAGLAR KWIKPEN) 100  UNIT/ML INJECT 55 UNITS OR AS DIRECTED UNDER THE SKIN DAILY 45 mL 3  . LANTUS SOLOSTAR 100 UNIT/ML Solostar Pen Inject 55 Units into the skin daily after breakfast. 15 mL 11  . magnesium citrate SOLN Take 1 Bottle by mouth once as needed for mild constipation or moderate constipation.     . metoprolol succinate (TOPROL-XL) 25 MG 24 hr tablet TAKE 1 TABLET BY MOUTH ONCE DAILY 90 tablet 2  . nitroGLYCERIN (NITROSTAT) 0.4 MG SL tablet Place 1 tablet (0.4 mg total) under the tongue every 5 (five) minutes as needed for chest pain. 25 tablet 3  . pantoprazole (PROTONIX) 40 MG tablet TAKE 1 TABLET (40 MG TOTAL) BY MOUTH 2 (TWO) TIMES DAILY. 60 tablet 5  . sacubitril-valsartan (ENTRESTO) 49-51 MG TAKE 1 TABLET BY MOUTH TWICE DAILY. 180 tablet 2   No facility-administered medications prior to visit.     Allergies:   Pineapple, Tomato, and Rosuvastatin calcium   Social History   Socioeconomic History  . Marital status: Married    Spouse name: Not on file  . Number of children: Not on file  . Years of education: Not on file  . Highest education level: Not on file  Occupational History  . Occupation: maintenance  Tobacco Use  . Smoking status: Never Smoker  . Smokeless  tobacco: Former Systems developer    Types: Secondary school teacher  . Vaping Use: Never used  Substance and Sexual Activity  . Alcohol use: Yes    Comment: occasionally  . Drug use: Not Currently    Types: Marijuana    Comment: quit 10-15 years ago  . Sexual activity: Not on file  Other Topics Concern  . Not on file  Social History Narrative  . Not on file   Social Determinants of Health   Financial Resource Strain: Not on file  Food Insecurity: Not on file  Transportation Needs: Not on file  Physical Activity: Not on file  Stress: Not on file  Social Connections: Not on file    Socially, he is married most recently for 2 years.  He has 6 boys and a daughter.  He previously was working as a Therapist, occupational.  Family History:  The patient's family history includes Diabetes in his father, maternal grandfather, maternal grandmother, mother, paternal grandfather, and paternal grandmother; Hypertension in his father, maternal grandfather, maternal grandmother, mother, paternal grandfather, and paternal grandmother.   His father is 70 and has dementia and had a history of MI.  Mother is 51 and has history of MI.  ROS General: Negative; No fevers, chills, or night sweats;  HEENT: Negative; No changes in vision or hearing, sinus congestion, difficulty swallowing Pulmonary: Negative; No cough, wheezing, shortness of breath, hemoptysis Cardiovascular: Negative; No chest pain, presyncope, syncope, palpitations GI: Negative; No nausea, vomiting, diarrhea, or abdominal pain GU: Negative; No dysuria, hematuria, or difficulty voiding Musculoskeletal: Negative; no myalgias, joint pain, or weakness Hematologic/Oncology: Negative; no easy bruising, bleeding Endocrine: Negative; no heat/cold intolerance; no diabetes Neuro: Negative; no changes in balance, headaches Skin: Negative; No rashes or skin lesions Psychiatric: Negative; No behavioral problems, depression Sleep: Positive for snoring, OSA on home  sleep study; no bruxism, restless legs, hypnogognic hallucinations, no cataplexy Other comprehensive 14 point system review is negative.   PHYSICAL EXAM:   VS:  BP (!) 178/90 (BP Location: Left Arm, Patient Position: Sitting, Cuff Size: Large)   Pulse (!) 55   Ht $R'6\' 1"'oh$  (1.854 m)   Wt (!) 325 lb (  147.4 kg)   BMI 42.88 kg/m     Repeat blood pressure by me 178/94  Wt Readings from Last 3 Encounters:  01/28/21 (!) 325 lb (147.4 kg)  09/01/20 (!) 314 lb (142.4 kg)  07/06/20 (!) 303 lb (137.4 kg)    General: Alert, oriented, no distress.  Bearded, morbidly obese Skin: normal turgor, no rashes, warm and dry HEENT: Normocephalic, atraumatic. Pupils equal round and reactive to light; sclera anicteric; extraocular muscles intact;  Nose without nasal septal hypertrophy Mouth/Parynx benign; Mallinpatti scale 3/4 Neck: Thick neck; no JVD, no carotid bruits; normal carotid upstroke Lungs: clear to ausculatation and percussion; no wheezing or rales Chest wall: without tenderness to palpitation Heart: PMI not displaced, RRR, s1 s2 normal, 1/6 systolic murmur, no diastolic murmur, no rubs, gallops, thrills, or heaves Abdomen: Central adiposity; soft, nontender; no hepatosplenomehaly, BS+; abdominal aorta nontender and not dilated by palpation. Back: no CVA tenderness Pulses 2+ Musculoskeletal: full range of motion, normal strength, no joint deformities Extremities: no clubbing cyanosis or edema, Homan's sign negative  Neurologic: grossly nonfocal; Cranial nerves grossly wnl Psychologic: Normal mood and affect   Studies/Labs Reviewed:   EKG:  EKG is ordered today.  ECG (independently read by me): Sinus bradycardia at 55; QS anteriorly c/w old Ant MI;  Recent Labs: BMP Latest Ref Rng & Units 01/28/2021 07/01/2020 06/11/2020  Glucose 65 - 99 mg/dL 145(H) 173(H) 180(H)  BUN 6 - 24 mg/dL $Remove'15 15 15  'uIiOTbV$ Creatinine 0.76 - 1.27 mg/dL 1.02 1.08 1.07  BUN/Creat Ratio 9 - $R'20 15 14 'Cu$ -  Sodium 134 - 144 mmol/L  142 139 135  Potassium 3.5 - 5.2 mmol/L 4.5 5.1 4.3  Chloride 96 - 106 mmol/L 106 102 102  CO2 20 - 29 mmol/L $RemoveB'22 24 23  'pwVjBtaJ$ Calcium 8.7 - 10.2 mg/dL 9.0 9.6 8.6(L)     Hepatic Function Latest Ref Rng & Units 01/28/2021 07/29/2020 05/30/2020  Total Protein 6.0 - 8.5 g/dL 7.0 6.9 8.0  Albumin 4.0 - 5.0 g/dL 4.2 4.3 3.7  AST 0 - 40 IU/L 17 15 74(H)  ALT 0 - 44 IU/L $Remov'18 13 31  'llIqjy$ Alk Phosphatase 44 - 121 IU/L 83 77 62  Total Bilirubin 0.0 - 1.2 mg/dL 0.5 0.6 1.1  Bilirubin, Direct 0.00 - 0.40 mg/dL - 0.18 -    CBC Latest Ref Rng & Units 01/28/2021 07/01/2020 06/11/2020  WBC 3.4 - 10.8 x10E3/uL 4.8 4.7 8.4  Hemoglobin 13.0 - 17.7 g/dL 14.2 13.6 10.2(L)  Hematocrit 37.5 - 51.0 % 42.2 41.1 32.6(L)  Platelets 150 - 450 x10E3/uL 259 266 529(H)   Lab Results  Component Value Date   MCV 90 01/28/2021   MCV 92 07/01/2020   MCV 96.2 06/11/2020   Lab Results  Component Value Date   TSH 2.440 01/28/2021   Lab Results  Component Value Date   HGBA1C 6.7 (H) 01/28/2021     BNP No results found for: BNP  ProBNP    Component Value Date/Time   PROBNP 267 (H) 01/28/2021 1000     Lipid Panel     Component Value Date/Time   CHOL 179 01/28/2021 1000   TRIG 52 01/28/2021 1000   HDL 45 01/28/2021 1000   CHOLHDL 4.0 01/28/2021 1000   CHOLHDL 5.1 05/30/2020 0952   VLDL 15 05/30/2020 0952   LDLCALC 124 (H) 01/28/2021 1000   LABVLDL 10 01/28/2021 1000     RADIOLOGY: No results found.   Additional studies/ records that were reviewed today include:  EMERGENT CATH/PCI: 05/30/2020  3rd Mrg lesion is 50% stenosed.  LPDA lesion is 70% stenosed.  LPAV-1 lesion is 30% stenosed.  LPAV-2 lesion is 30% stenosed.  1st Diag lesion is 70% stenosed.  Ramus lesion is 20% stenosed.  Mid LAD lesion is 100% stenosed.  Post intervention, there is a 0% residual stenosis.  A stent was successfully placed.   Acute anterolateral ST segment elevation myocardial infarction secondary to total occlusion  of the mid LAD with TIMI 0 flow and absence of collaterals.  Concomitant CAD with tortuous 1st diagonal vessel with 70% narrowing beyond a sharp bend in the vessel, 20% narrowing beyond a sharp bend in the ramus immediate vessel, and dominant left circumflex coronary artery with 50% focal OM 3 stenosis, distal 30% stenoses in the AV groove, and focal 70% stenosis in the midportion of a small PLA branch.  Normal small nondominant RCA.  LVEDP 12 millimeters Hg.   Successful PCI to the totally occluded mid LAD with ultimate insertion of a Resolute Onyx 3.0 x 22 mm DES stent postdilated to 3.25 mm with digital narrowing of 0% and restoration of TIMI-3 flow.  RECOMMENDATION: DAPT for minimum of 1 year.  Aggressive lipid-lowering therapy with titration of atorvastatin to 80 mg daily.  We will reinitiate ARB therapy with losartan initially at lower dose, add low-dose beta-blocker therapy, and obtain 2D echo Doppler study in a.m. optimal blood pressure control with target blood pressure less than 130/80 and ideal blood pressure less than 120/80.  We will diabetes control.  If patient has LV dysfunction, SGLT 2 inhibition can be considered.   ECHO 05/31/2020 IMPRESSIONS  1. Akinesis of the mid septum into the apex consistent with LAD  infarction. Contrast imaging shows no evidence of LV thrombus. Left  ventricular ejection fraction, by estimation, is 35 to 40%. The left  ventricle has moderately decreased function. The  left ventricle demonstrates regional wall motion abnormalities (see  scoring diagram/findings for description). Left ventricular diastolic  parameters are consistent with Grade II diastolic dysfunction  (pseudonormalization). Elevated left atrial pressure.  2. Right ventricular systolic function is normal. The right ventricular  size is normal. There is normal pulmonary artery systolic pressure. The  estimated right ventricular systolic pressure is 19.3 mmHg.  3. The mitral valve  is grossly normal. Trivial mitral valve  regurgitation. No evidence of mitral stenosis.  4. The aortic valve is tricuspid. Aortic valve regurgitation is not  visualized. Mild aortic valve sclerosis is present, with no evidence of  aortic valve stenosis.  5. The inferior vena cava is normal in size with greater than 50%  respiratory variability, suggesting right atrial pressure of 3 mmHg.   Conclusion(s)/Recommendation(s): Findings consistent with ischemic  cardiomyopathy.    TEE 06/10/2020 IMPRESSIONS  1. Left ventricular ejection fraction, by estimation, is 40 to 45%. The  left ventricle has mildly decreased function. The left ventricle  demonstrates regional wall motion abnormalities. Apical hypokinesis.  2. Right ventricular systolic function is normal. The right ventricular  size is normal.  3. The mitral valve is normal in structure. Trivial mitral valve  regurgitation.  4. The aortic valve is tricuspid. Aortic valve regurgitation is not  visualized. No aortic stenosis is present.  5. No left atrial/left atrial appendage thrombus was detected.   ASSESSMENT:    1. ST elevation myocardial infarction involving left anterior descending (LAD) coronary artery (HCC): 05/30/2020: DES stent to LAD   2. Essential hypertension   3. Ischemic cardiomyopathy   4. Hyperlipidemia  with target LDL less than 70   5. Morbid obesity ([place])   6. OSA (obstructive sleep apnea)   7. Atrial flutter, unspecified type [facility]): TEE DC cardioversion June 10, 2020   8. Anticoagulated     PLAN:  Mr. Carol Loftin is a 50 year old gentleman who has a history of morbid obesity, hypertension, hyperlipidemia, and untreated obstructive sleep apnea.  He developed severe chest pain and was awakened from sleep the morning May 30, 2020 and was found to have acute anterolateral STEMI secondary to total mid occlusion of his LAD.  He was found to have concomitant CAD and his LAD was successfully stented with  a 3.0 x 22 mm Resolute Onyx DES stent postdilated to 3.25 mm with 100% occlusion being reduced to 0% and restoration of TIMI-3 flow.  He has LV dysfunction secondary to his LAD infarction with initial EF at 35 to 40% which subsequently improved several weeks later to 40 to 45%.  He had developed an episode of atrial flutter following Sudafed and ultimately was converted successfully back to sinus rhythm with DC cardioversion.  His blood pressure today is significantly elevated with stage II hypertension and I am recommending further titration of Entresto to 97/103 mg twice daily.  I am also adding spironolactone 12.5 mg to his medical regimen particularly with his elevated blood pressure.  I reviewed his sleep study which was done in July 2021 and confirmed at least moderate overall sleep apnea but he had significant oxygen desaturation to a nadir of 74% suggesting perhaps more severe sleep apnea during REM sleep.  He has not had any follow-up of this study in I discussed with him his findings and he would prefer that I manage his sleep apnea.  Unfortunately since 9 months have evolved since his initial evaluation he will need to undergo a new evaluation.  However I will schedule him for in lab split-night sleep study such that CPAP titration can be done at that time.  I am recommending he undergo fasting laboratory with a comprehensive metabolic panel, CBC, proBNP, TSH, lipid studies, and hemoglobin A1c.  With his increased Entresto dose and spironolactone initiation in 2 weeks he will also have a follow-up be met.  He has participated in the AEGIS trial and he continues to be on atorvastatin 80 mg daily.  I have scheduled him for a follow-up echo Doppler study in 3 months on maximum dose Entresto.  In the future Farxiga may be increased to 10 mg.  I will see him in 3 months for reevaluation or sooner as needed.   Medication Adjustments/Labs and Tests Ordered: Current medicines are reviewed at length with the  patient today.  Concerns regarding medicines are outlined above.  Medication changes, Labs and Tests ordered today are listed in the Patient Instructions below. Patient Instructions  Medication Instructions:  Increase Entresto 97/103 twice daily  Start Spironolactone 12.5 mg daily   *If you need a refill on your cardiac medications before your next appointment, please call your pharmacy*   Lab Work: CMET, CBC, TSH, LIPID, Pro BNP, A1C BMET check in 1 week (no lab appointment needed)  If you have labs (blood work) drawn today and your tests are completely normal, you will receive your results only by: Marland Kitchen MyChart Message (if you have MyChart) OR . A paper copy in the mail If you have any lab test that is abnormal or we need to change your treatment, we will call you to review the results.   Testing/Procedures:  Your physician has recommended that you have a sleep study. This test records several body functions during sleep, including: brain activity, eye movement, oxygen and carbon dioxide blood levels, heart rate and rhythm, breathing rate and rhythm, the flow of air through your mouth and nose, snoring, body muscle movements, and chest and belly movement.  Echocardiogram (3 months) - Your physician has requested that you have an echocardiogram. Echocardiography is a painless test that uses sound waves to create images of your heart. It provides your doctor with information about the size and shape of your heart and how well your heart's chambers and valves are working. This procedure takes approximately one hour. There are no restrictions for this procedure. This will be performed at our [facility] location - [address], [address].   Follow-Up: At [facility], you and your health needs are our priority.  As part of our continuing mission to provide you with exceptional heart care, we have created designated Provider Care Teams.  These Care Teams include your primary Cardiologist  (physician) and Advanced Practice Providers (APPs -  Physician Assistants and Nurse Practitioners) who all work together to provide you with the care you need, when you need it.  We recommend signing up for the patient portal called "MyChart".  Sign up information is provided on this After Visit Summary.  MyChart is used to connect with patients for Virtual Visits (Telemedicine).  Patients are able to view lab/test results, encounter notes, upcoming appointments, etc.  Non-urgent messages can be sent to your provider as well.   To learn more about what you can do with MyChart, go to [email or web address].    Your next appointment:   3 month(s)  The format for your next appointment:   In Person  Provider:   Shelva Majestic, MD       Signed, Shelva Majestic, MD  01/30/2021 2:43 PM    [facility] [address] [place], [place], [place]  [address] Phone: [phone]

## 2021-01-28 NOTE — Patient Instructions (Signed)
Medication Instructions:  Increase Entresto 97/103 twice daily  Start Spironolactone 12.5 mg daily   *If you need a refill on your cardiac medications before your next appointment, please call your pharmacy*   Lab Work: CMET, CBC, TSH, LIPID, Pro BNP, A1C BMET check in 1 week (no lab appointment needed)  If you have labs (blood work) drawn today and your tests are completely normal, you will receive your results only by: Marland Kitchen MyChart Message (if you have MyChart) OR . A paper copy in the mail If you have any lab test that is abnormal or we need to change your treatment, we will call you to review the results.   Testing/Procedures:  Your physician has recommended that you have a sleep study. This test records several body functions during sleep, including: brain activity, eye movement, oxygen and carbon dioxide blood levels, heart rate and rhythm, breathing rate and rhythm, the flow of air through your mouth and nose, snoring, body muscle movements, and chest and belly movement.  Echocardiogram (3 months) - Your physician has requested that you have an echocardiogram. Echocardiography is a painless test that uses sound waves to create images of your heart. It provides your doctor with information about the size and shape of your heart and how well your heart's chambers and valves are working. This procedure takes approximately one hour. There are no restrictions for this procedure. This will be performed at our [facility] location - [address].   Follow-Up: At [facility], you and your health needs are our priority.  As part of our continuing mission to provide you with exceptional heart care, we have created designated Provider Care Teams.  These Care Teams include your primary Cardiologist (physician) and Advanced Practice Providers (APPs -  Physician Assistants and Nurse Practitioners) who all work together to provide you with the care you need, when you need it.  We  recommend signing up for the patient portal called "MyChart".  Sign up information is provided on this After Visit Summary.  MyChart is used to connect with patients for Virtual Visits (Telemedicine).  Patients are able to view lab/test results, encounter notes, upcoming appointments, etc.  Non-urgent messages can be sent to your provider as well.   To learn more about what you can do with MyChart, go to [email or web address].    Your next appointment:   3 month(s)  The format for your next appointment:   In Person  Provider:   Nicki Guadalajara, MD

## 2021-01-29 LAB — LIPID PANEL
Chol/HDL Ratio: 4 ratio (ref 0.0–5.0)
Cholesterol, Total: 179 mg/dL (ref 100–199)
HDL: 45 mg/dL (ref >39)
LDL Chol Calc (NIH): 124 mg/dL — ABNORMAL HIGH (ref 0–99)
Triglycerides: 52 mg/dL (ref 0–149)
VLDL Cholesterol Cal: 10 mg/dL (ref 5–40)

## 2021-01-29 LAB — CBC
Hematocrit: 42.2 % (ref 37.5–51.0)
Hemoglobin: 14.2 g/dL (ref 13.0–17.7)
MCH: 30.4 pg (ref 26.6–33.0)
MCHC: 33.6 g/dL (ref 31.5–35.7)
MCV: 90 fL (ref 79–97)
Platelets: 259 10*3/uL (ref 150–450)
RBC: 4.67 x10E6/uL (ref 4.14–5.80)
RDW: 11.3 % — ABNORMAL LOW (ref 11.6–15.4)
WBC: 4.8 10*3/uL (ref 3.4–10.8)

## 2021-01-29 LAB — COMPREHENSIVE METABOLIC PANEL
ALT: 18 IU/L (ref 0–44)
AST: 17 IU/L (ref 0–40)
Albumin/Globulin Ratio: 1.5 (ref 1.2–2.2)
Albumin: 4.2 g/dL (ref 4.0–5.0)
Alkaline Phosphatase: 83 IU/L (ref 44–121)
BUN/Creatinine Ratio: 15 (ref 9–20)
BUN: 15 mg/dL (ref 6–24)
Bilirubin Total: 0.5 mg/dL (ref 0.0–1.2)
CO2: 22 mmol/L (ref 20–29)
Calcium: 9 mg/dL (ref 8.7–10.2)
Chloride: 106 mmol/L (ref 96–106)
Creatinine, Ser: 1.02 mg/dL (ref 0.76–1.27)
Globulin, Total: 2.8 g/dL (ref 1.5–4.5)
Glucose: 145 mg/dL — ABNORMAL HIGH (ref 65–99)
Potassium: 4.5 mmol/L (ref 3.5–5.2)
Sodium: 142 mmol/L (ref 134–144)
Total Protein: 7 g/dL (ref 6.0–8.5)
eGFR: 91 mL/min/{1.73_m2} (ref >59)

## 2021-01-29 LAB — TSH: TSH: 2.44 u[IU]/mL (ref 0.450–4.500)

## 2021-01-29 LAB — PRO B NATRIURETIC PEPTIDE: NT-Pro BNP: 267 pg/mL — ABNORMAL HIGH (ref 0–121)

## 2021-01-29 LAB — HEMOGLOBIN A1C
Est. average glucose Bld gHb Est-mCnc: 146 mg/dL
Hgb A1c MFr Bld: 6.7 % — ABNORMAL HIGH (ref 4.8–5.6)

## 2021-01-29 MED FILL — Pantoprazole Sodium EC Tab 40 MG (Base Equiv): ORAL | 30 days supply | Qty: 60 | Fill #0 | Status: AC

## 2021-01-30 ENCOUNTER — Encounter: Payer: Self-pay | Admitting: Cardiovascular Disease

## 2021-01-31 ENCOUNTER — Other Ambulatory Visit (HOSPITAL_COMMUNITY): Payer: Self-pay

## 2021-02-01 ENCOUNTER — Other Ambulatory Visit (HOSPITAL_COMMUNITY): Payer: Self-pay

## 2021-02-07 ENCOUNTER — Ambulatory Visit: Payer: 59 | Admitting: Podiatrist

## 2021-02-10 ENCOUNTER — Other Ambulatory Visit: Payer: Self-pay

## 2021-02-10 ENCOUNTER — Other Ambulatory Visit (HOSPITAL_COMMUNITY): Payer: Self-pay

## 2021-02-10 MED ORDER — EZETIMIBE 10 MG PO TABS
10.0000 mg | ORAL_TABLET | Freq: Every day | ORAL | 3 refills | Status: DC
  Filled 2021-02-10: qty 90, 90d supply, fill #0

## 2021-02-11 ENCOUNTER — Other Ambulatory Visit (HOSPITAL_COMMUNITY): Payer: Self-pay

## 2021-02-11 ENCOUNTER — Telehealth: Payer: Self-pay | Admitting: Cardiovascular Disease

## 2021-02-11 MED ORDER — EZETIMIBE 10 MG PO TABS
10.0000 mg | ORAL_TABLET | Freq: Every day | ORAL | 3 refills | Status: DC
  Filled 2021-02-11: qty 90, 90d supply, fill #0

## 2021-02-11 MED ORDER — EZETIMIBE 10 MG PO TABS: 10.0000 mg | ORAL_TABLET | Freq: Every day | ORAL | 3 refills | Status: DC

## 2021-02-11 NOTE — Addendum Note (Signed)
Addended by: Raelyn Number on: 02/11/2021 03:45 PM   Modules accepted: Orders

## 2021-02-11 NOTE — Telephone Encounter (Signed)
Per Zena Amos with [facility], patient's medication has been sent to the incorrect pharmacy. She is requesting to have the Rx transferred from [facility] to patient's local pharmacy listed below.  If questions, return call to [place] at [phone]. Otherwise, please contact the patient to confirm change.  *STAT* If patient is at the pharmacy, call can be transferred to refill team.   1. Which medications need to be refilled? (please list name of each medication and dose if known)  -ezetimibe (ZETIA) 10 MG tablet  2. Which pharmacy/location (including street and city if local pharmacy) is medication to be sent to? [facility]  [address] [place], [place] [address]  3. Do they need a 30 day or 90 day supply?  90 day supply

## 2021-02-11 NOTE — Telephone Encounter (Signed)
Spoke with patient and let him know that refills have been sent to correct pharmacy with is [facility].

## 2021-02-14 ENCOUNTER — Other Ambulatory Visit (HOSPITAL_COMMUNITY): Payer: Self-pay

## 2021-02-14 MED ORDER — INSULIN GLARGINE-YFGN 100 UNIT/ML ~~LOC~~ SOPN
55.0000 [IU] | PEN_INJECTOR | Freq: Every day | SUBCUTANEOUS | 3 refills | Status: DC
  Filled 2021-02-14: qty 15, 27d supply, fill #0
  Filled 2021-03-13: qty 15, 27d supply, fill #1
  Filled 2021-04-07: qty 15, 27d supply, fill #2
  Filled 2021-05-02: qty 15, 27d supply, fill #3
  Filled 2021-05-31: qty 15, 27d supply, fill #4
  Filled 2021-06-24: qty 15, 27d supply, fill #5
  Filled 2021-07-24: qty 15, 27d supply, fill #6
  Filled 2021-08-17: qty 15, 27d supply, fill #7
  Filled 2021-09-13: qty 15, 27d supply, fill #8
  Filled 2021-10-25: qty 15, 27d supply, fill #9
  Filled 2021-11-24: qty 15, 27d supply, fill #10

## 2021-02-14 MED FILL — Insulin Glargine Soln Pen-Injector 100 Unit/ML: SUBCUTANEOUS | 84 days supply | Qty: 45 | Fill #0 | Status: CN

## 2021-02-14 MED FILL — Insulin Glargine Soln Pen-Injector 100 Unit/ML: SUBCUTANEOUS | 5 days supply | Qty: 3 | Fill #0 | Status: CN

## 2021-02-15 MED FILL — Atorvastatin Calcium Tab 80 MG (Base Equivalent): ORAL | 90 days supply | Qty: 90 | Fill #0 | Status: AC

## 2021-02-16 ENCOUNTER — Other Ambulatory Visit (HOSPITAL_COMMUNITY): Payer: Self-pay

## 2021-02-17 ENCOUNTER — Other Ambulatory Visit (HOSPITAL_COMMUNITY): Payer: Self-pay

## 2021-02-18 ENCOUNTER — Encounter: Payer: Self-pay | Admitting: Podiatrist

## 2021-02-18 ENCOUNTER — Ambulatory Visit (INDEPENDENT_AMBULATORY_CARE_PROVIDER_SITE_OTHER): Payer: 59

## 2021-02-18 ENCOUNTER — Other Ambulatory Visit: Payer: Self-pay

## 2021-02-18 ENCOUNTER — Other Ambulatory Visit: Payer: Self-pay | Admitting: Podiatrist

## 2021-02-18 ENCOUNTER — Ambulatory Visit (INDEPENDENT_AMBULATORY_CARE_PROVIDER_SITE_OTHER): Payer: 59 | Admitting: Podiatrist

## 2021-02-18 DIAGNOSIS — R432 Parageusia: Secondary | ICD-10-CM | POA: Insufficient documentation

## 2021-02-18 DIAGNOSIS — E7800 Pure hypercholesterolemia, unspecified: Secondary | ICD-10-CM | POA: Insufficient documentation

## 2021-02-18 DIAGNOSIS — L603 Nail dystrophy: Secondary | ICD-10-CM | POA: Diagnosis not present

## 2021-02-18 DIAGNOSIS — R430 Anosmia: Secondary | ICD-10-CM | POA: Insufficient documentation

## 2021-02-18 DIAGNOSIS — Z794 Long term (current) use of insulin: Secondary | ICD-10-CM | POA: Insufficient documentation

## 2021-02-18 DIAGNOSIS — M79672 Pain in left foot: Secondary | ICD-10-CM

## 2021-02-18 DIAGNOSIS — B351 Tinea unguium: Secondary | ICD-10-CM | POA: Diagnosis not present

## 2021-02-18 DIAGNOSIS — G4700 Insomnia, unspecified: Secondary | ICD-10-CM | POA: Insufficient documentation

## 2021-02-18 DIAGNOSIS — M722 Plantar fascial fibromatosis: Secondary | ICD-10-CM | POA: Diagnosis not present

## 2021-02-18 DIAGNOSIS — G479 Sleep disorder, unspecified: Secondary | ICD-10-CM | POA: Insufficient documentation

## 2021-02-18 DIAGNOSIS — E1165 Type 2 diabetes mellitus with hyperglycemia: Secondary | ICD-10-CM | POA: Insufficient documentation

## 2021-02-18 DIAGNOSIS — M79671 Pain in right foot: Secondary | ICD-10-CM | POA: Diagnosis not present

## 2021-02-18 DIAGNOSIS — E663 Overweight: Secondary | ICD-10-CM | POA: Insufficient documentation

## 2021-02-18 DIAGNOSIS — I5021 Acute systolic (congestive) heart failure: Secondary | ICD-10-CM | POA: Insufficient documentation

## 2021-02-18 DIAGNOSIS — I2510 Atherosclerotic heart disease of native coronary artery without angina pectoris: Secondary | ICD-10-CM | POA: Insufficient documentation

## 2021-02-18 DIAGNOSIS — F419 Anxiety disorder, unspecified: Secondary | ICD-10-CM | POA: Insufficient documentation

## 2021-02-18 MED ORDER — TRIAMCINOLONE ACETONIDE 10 MG/ML IJ SUSP
10.0000 mg | Freq: Once | INTRAMUSCULAR | Status: AC
  Administered 2021-02-18: 10 mg

## 2021-02-18 NOTE — Patient Instructions (Signed)

## 2021-02-18 NOTE — Progress Notes (Signed)
Chief Complaint  Patient presents with  . Foot Pain    Reports right foot plantar-heel pains for two months now-worsen w/ being on feet. Can be sharp intermittently but more pressure/achy.   . Nail Problem    Thick/discolored toenails. Tx: tinactin      HPI: Patient is 50 y.o. male who presents today for the concerns as listed above.  Patient states that his right foot has been hurting for about 2 months and it continues to be worse.  Relates pain with first up in the morning or after sitting for long periods of time is the worst pain.  Patient also has some thickened discolored nails he is tried topical therapies which have failed to relieve the thickness.  Patient Active Problem List   Diagnosis Date Noted  . Acute systolic heart failure (HCC) 02/18/2021  . Anosmia 02/18/2021  . Anxiousness 02/18/2021  . Atherosclerotic heart disease of native coronary artery without angina pectoris 02/18/2021  . Hyperglycemia due to type 2 diabetes mellitus (HCC) 02/18/2021  . Insomnia 02/18/2021  . Long term (current) use of insulin (HCC) 02/18/2021  . Loss of taste 02/18/2021  . Overweight 02/18/2021  . Pure hypercholesterolemia 02/18/2021  . Sleep disorder 02/18/2021  . Cough 06/10/2020  . Atrial flutter (HCC) 06/08/2020  . Ischemic cardiomyopathy 06/01/2020  . Hypertension 06/01/2020  . Hyperlipidemia with target LDL less than 70 06/01/2020  . DM type 2, goal HbA1c < 7% (HCC) 06/01/2020  . OSA (obstructive sleep apnea) 06/01/2020  . Morbid obesity (HCC) 06/01/2020  . ST elevation myocardial infarction involving left anterior descending (LAD) coronary artery (HCC)   . Angina pectoris (HCC) 11/24/2018    Current Outpatient Medications on File Prior to Visit  Medication Sig Dispense Refill  . acetaminophen (TYLENOL) 650 MG CR tablet Take 1,300 mg by mouth every 8 (eight) hours as needed for pain.    Marland Kitchen albuterol (VENTOLIN HFA) 108 (90 Base) MCG/ACT inhaler Inhale 2 puffs into the lungs  every 6 (six) hours as needed for wheezing or shortness of breath.    . ALPRAZolam (XANAX) 0.25 MG tablet Take 0.25 mg by mouth daily as needed.    Marland Kitchen apixaban (ELIQUIS) 5 MG TABS tablet TAKE 1 TABLET BY MOUTH TWO TIMES DAILY 180 tablet 1  . atorvastatin (LIPITOR) 80 MG tablet Take 1 tablet (80 mg total) by mouth daily at 6 PM. 90 tablet 3  . atorvastatin (LIPITOR) 80 MG tablet TAKE 1 TABLET (80 MG TOTAL) BY MOUTH DAILY AT 6 PM. 90 tablet 3  . cetirizine (ZYRTEC) 10 MG tablet TAKE 1 TABLET(10 MG) BY MOUTH DAILY 30 tablet 5  . clopidogrel (PLAVIX) 75 MG tablet TAKE 1 TABLET BY MOUTH ONCE DAILY 90 tablet 2  . dapagliflozin propanediol (FARXIGA) 5 MG TABS tablet Take 1 tablet (5 mg total) by mouth daily. 90 tablet 1  . dapagliflozin propanediol (FARXIGA) 5 MG TABS tablet TAKE 1 TABLET BY MOUTH ONCE DAILY. 90 tablet 0  . ezetimibe (ZETIA) 10 MG tablet Take 1 tablet (10 mg total) by mouth daily. 90 tablet 3  . fluticasone (FLONASE) 50 MCG/ACT nasal spray SHAKE LIQUID AND USE 1 SPRAY IN EACH NOSTRIL DAILY (Patient taking differently: Place 2 sprays into both nostrils daily as needed for allergies or rhinitis.) 16 g 2  . ibuprofen (ADVIL) 800 MG tablet TAKE 1 TABLET BY MOUTH EVERY 6 HOURS AS NEEDED FOR PAIN (Patient taking differently: Take by mouth every 6 (six) hours as needed. for pain) 20 tablet 0  .  Insulin Glargine (BASAGLAR KWIKPEN) 100 UNIT/ML INJECT 55 UNITS OR AS DIRECTED UNDER THE SKIN DAILY 45 mL 3  . Insulin Glargine-yfgn (SEMGLEE, YFGN,) 100 UNIT/ML SOPN Inject 55 Units into the skin daily or as directed. 45 mL 3  . LANTUS SOLOSTAR 100 UNIT/ML Solostar Pen Inject 55 Units into the skin daily after breakfast. 15 mL 11  . magnesium citrate SOLN Take 1 Bottle by mouth once as needed for mild constipation or moderate constipation.     . metoprolol succinate (TOPROL-XL) 25 MG 24 hr tablet TAKE 1 TABLET BY MOUTH ONCE DAILY 90 tablet 2  . nitroGLYCERIN (NITROSTAT) 0.4 MG SL tablet Place 1 tablet (0.4  mg total) under the tongue every 5 (five) minutes as needed for chest pain. 25 tablet 3  . pantoprazole (PROTONIX) 40 MG tablet TAKE 1 TABLET (40 MG TOTAL) BY MOUTH 2 (TWO) TIMES DAILY. 60 tablet 5  . sacubitril-valsartan (ENTRESTO) 97-103 MG Take 1 tablet by mouth 2 (two) times daily. 60 tablet 3  . spironolactone (ALDACTONE) 25 MG tablet Take 0.5 tablets (12.5 mg total) by mouth daily. 60 tablet 1   No current facility-administered medications on file prior to visit.    Allergies  Allergen Reactions  . Pineapple Itching and Other (See Comments)    All-over itching (mouth, too)  . Tomato Itching and Other (See Comments)    All-over itching (mouth, too) Ketchup, also  . Rosuvastatin Calcium Hives and Rash    Review of Systems No fevers, chills, nausea, muscle aches, no difficulty breathing, no calf pain, no chest pain or shortness of breath.   Physical Exam  GENERAL APPEARANCE: Alert, conversant. Appropriately groomed. No acute distress.   VASCULAR: Pedal pulses palpable DP and PT bilateral.  Capillary refill time is immediate to all digits,  Proximal to distal cooling it warm to warm.  Digital perfusion adequate.   NEUROLOGIC: sensation is intact to 5.07 monofilament at 5/5 sites bilateral.  Light touch is intact bilateral, vibratory sensation intact bilateral  MUSCULOSKELETAL: acceptable muscle strength, tone and stability bilateral.  Pes planus foot type is noted.  Pain on palpation plantar medial aspect of the right foot is noted consistent with plantar fasciitis symptomatology at its insertion.  DERMATOLOGIC: skin is warm, supple, and dry.  No open lesions noted.  No rash, no pre ulcerative lesions. Digital nails bilateral first and second are thick, discolored, dystrophic with subungual debris present.  Left second toenail is the most symptomatic.  X-ray evaluation 2 views of the right foot are obtained.  Infracalcaneal spur is present with thickening of the plantar fashion  noted.  Flattening of the first metatarsal phalangeal joint is seen.  No acute osseous abnormalities are noted.  Assessment     ICD-10-CM   1. Onychodystrophy  L60.3 Culture, fungus without smear  2. Foot pain, bilateral  M79.671    M79.672   3. Left foot pain  M79.672 CANCELED: DG Foot Complete Left  4. Foot pain, right  M79.671 CANCELED: DG Foot Complete Right  5. Onychomycosis  B35.1   6. Plantar fasciitis of right foot  M72.2      Plan  -Exam findings discussed with the patient -Recommended an injection into the right heel for the plantar fasciitis and the patient agreed I prepped the skin with alcohol and infiltrated 10 mg Kenalog and Marcaine mixture into the plantar aspect of the right heel without complication.  It was given stretching instructions and aftercare instructions. Took a sample of the left second toenail  to send off for culture.  I will call with the result of the culture and decide on therapy at that time.  Patient would be interested in Lamisil if he is a candidate. -Patient will be seen back for recheck of the heel in 1 month.  If any concerns arise prior to that visit he will call.  He is also instructed to continue using his inserts.

## 2021-02-21 ENCOUNTER — Telehealth: Payer: Self-pay | Admitting: *Deleted

## 2021-02-21 NOTE — Telephone Encounter (Signed)
Patient;s wife is calling because her husband still having pain after injection on Friday for his right foot,. He is limping and not able to apply much pressure. Please advise.

## 2021-02-21 NOTE — Telephone Encounter (Signed)
Called and informed patient of Dr Wynema Birch advise, he verbalized understanding the recommendations given.

## 2021-02-28 ENCOUNTER — Telehealth: Payer: Self-pay | Admitting: *Deleted

## 2021-02-28 NOTE — Telephone Encounter (Signed)
Patient is still having pain after his injection and now swelling more in bottom of foot and on outside. Please schedule for a sooner appointment.

## 2021-03-01 NOTE — Telephone Encounter (Signed)
Patient has been scheduled

## 2021-03-02 ENCOUNTER — Ambulatory Visit (INDEPENDENT_AMBULATORY_CARE_PROVIDER_SITE_OTHER): Payer: 59 | Admitting: Podiatry

## 2021-03-02 ENCOUNTER — Encounter: Payer: Self-pay | Admitting: Podiatry

## 2021-03-02 ENCOUNTER — Other Ambulatory Visit (HOSPITAL_COMMUNITY): Payer: Self-pay

## 2021-03-02 ENCOUNTER — Ambulatory Visit (INDEPENDENT_AMBULATORY_CARE_PROVIDER_SITE_OTHER): Payer: 59 | Admitting: Internal Medicine

## 2021-03-02 ENCOUNTER — Other Ambulatory Visit: Payer: Self-pay

## 2021-03-02 ENCOUNTER — Encounter: Payer: Self-pay | Admitting: Internal Medicine

## 2021-03-02 VITALS — BP 162/84 | HR 89 | Ht 73.0 in | Wt 314.0 lb

## 2021-03-02 DIAGNOSIS — E119 Type 2 diabetes mellitus without complications: Secondary | ICD-10-CM

## 2021-03-02 DIAGNOSIS — Z794 Long term (current) use of insulin: Secondary | ICD-10-CM | POA: Diagnosis not present

## 2021-03-02 DIAGNOSIS — E785 Hyperlipidemia, unspecified: Secondary | ICD-10-CM | POA: Diagnosis not present

## 2021-03-02 DIAGNOSIS — I2510 Atherosclerotic heart disease of native coronary artery without angina pectoris: Secondary | ICD-10-CM

## 2021-03-02 DIAGNOSIS — I10 Essential (primary) hypertension: Secondary | ICD-10-CM | POA: Diagnosis not present

## 2021-03-02 DIAGNOSIS — M722 Plantar fascial fibromatosis: Secondary | ICD-10-CM

## 2021-03-02 MED ORDER — TRIAMCINOLONE ACETONIDE 10 MG/ML IJ SUSP
10.0000 mg | Freq: Once | INTRAMUSCULAR | Status: AC
  Administered 2021-03-02: 10 mg

## 2021-03-02 MED ORDER — REPATHA SURECLICK 140 MG/ML ~~LOC~~ SOAJ
1.0000 | SUBCUTANEOUS | 11 refills | Status: DC
  Filled 2021-03-02: qty 2, 28d supply, fill #0
  Filled 2021-03-29: qty 2, 28d supply, fill #1
  Filled 2021-04-25: qty 2, 28d supply, fill #2
  Filled 2021-05-25: qty 2, 28d supply, fill #3
  Filled 2021-06-24: qty 2, 28d supply, fill #4
  Filled 2021-07-24: qty 2, 28d supply, fill #5
  Filled 2021-09-22: qty 2, 28d supply, fill #6
  Filled 2021-10-27: qty 2, 28d supply, fill #7
  Filled 2021-11-24: qty 2, 28d supply, fill #8
  Filled 2021-12-26: qty 2, 28d supply, fill #9
  Filled 2022-01-18: qty 2, 28d supply, fill #10
  Filled 2022-02-20: qty 2, 28d supply, fill #11

## 2021-03-02 MED ORDER — REPATHA SURECLICK 140 MG/ML ~~LOC~~ SOAJ
1.0000 | SUBCUTANEOUS | 11 refills | Status: DC
  Filled 2021-03-02: qty 2, fill #0

## 2021-03-02 NOTE — Addendum Note (Signed)
Addended by: Lindell Spar on: 03/02/2021 10:29 AM   Modules accepted: Orders

## 2021-03-02 NOTE — Progress Notes (Signed)
Subjective:   Patient ID: [name] Merritt, male   DOB: 50 y.o.   MRN: [id number]   HPI Patient states his right heel is extremely sore and its been making it hard for him to walk.  States its been getting worse and making it hard to bear weight and he does have swelling associated with it and did not respond well to previous treatment   ROS      Objective:  Physical Exam  Neurovascular status intact with patient found to have exquisite discomfort plantar aspect right heel at the insertional point tendon calcaneus with inflammation fluid of the medial band and pain acute     Assessment:  Acute Planter fasciitis right with fluid buildup around the medial band     Plan:  H&P and due to intense discomfort applied air fracture walker after injecting after sterile prep with 3 mg Dexasone Kenalog 5 mg Xylocaine.  Advised on ice therapy reappoint to recheck

## 2021-03-02 NOTE — Progress Notes (Signed)
LIPID CLINIC CONSULT NOTE  Chief Complaint:  Manage dyslipidemia  Primary Care Physician: Renford Dills, MD  Primary Cardiologist:  Nicki Guadalajara, MD  HPI:  [name] Merritt is a 50 y.o. male who is being seen today for the evaluation of dyslipidemia at the request of Azalee Course, [place].  [name] Merritt is seen today for management of dyslipidemia.  He has a history of coronary artery disease with acute coronary syndrome in August 2021 status post PCI.  He was also noted to have atrial flutter and underwent cardioversion for this.  Additional comorbidities include type 2 diabetes, marked dyslipidemia hypertension and morbid obesity.  He is recently been struggling with fatigue and is scheduled for a split-night study.  He is also had problems with plantar fasciitis and is limited his activity.  He was previously working a third shift job but had to stop that due to fatigue and shortness of breath.  He had recent lipids which showed total cholesterol 179, triglycerides 52, HDL 45 and LDL 124.  This demonstrates an increase in LDL from previous values in the 80s, probably related to decreased activity as he reports compliance with both high potency atorvastatin 80 mg and Zetia 10 mg daily.  He is working actively more at weight loss and dietary changes at this time but remains well above a target LDL less than 70.  Of note, [name] Merritt did agree to participation in the AEGIS-II trial for which she is now in the follow-up period.  PMHx:  Past Medical History:  Diagnosis Date  . Coronary artery disease   . Diabetes mellitus without complication (HCC)   . High cholesterol   . Hypertension   . Myocardial infarction (HCC)   . New onset atrial flutter (HCC) 06/09/2020  . Overweight     Past Surgical History:  Procedure Laterality Date  . CARDIOVERSION N/A 06/10/2020   Procedure: CARDIOVERSION;  Surgeon: Little Ishikawa, MD;  [facility]: [facility] ENDOSCOPY;  Service: Cardiovascular;  Laterality:  N/A;  . CORONARY STENT INTERVENTION N/A 05/30/2020   Procedure: CORONARY STENT INTERVENTION;  Surgeon: Lennette Bihari, MD;  Location: [facility] INVASIVE CV LAB;  Service: Cardiovascular;  Laterality: N/A;  . CORONARY/GRAFT ACUTE MI REVASCULARIZATION N/A 05/30/2020   Procedure: Coronary/Graft Acute MI Revascularization;  Surgeon: Lennette Bihari, MD;  Location: [facility] INVASIVE CV LAB;  Service: Cardiovascular;  Laterality: N/A;  . LEFT HEART CATH AND CORONARY ANGIOGRAPHY N/A 05/30/2020   Procedure: LEFT HEART CATH AND CORONARY ANGIOGRAPHY;  Surgeon: Lennette Bihari, MD;  Location: [facility] INVASIVE CV LAB;  Service: Cardiovascular;  Laterality: N/A;  . TEE WITHOUT CARDIOVERSION N/A 06/10/2020   Procedure: TRANSESOPHAGEAL ECHOCARDIOGRAM (TEE);  Surgeon: Little Ishikawa, MD;  Location: [facility] ENDOSCOPY;  Service: Cardiovascular;  Laterality: N/A;  . torn rotator cuff  2018   2018    FAMHx:  Family History  Problem Relation Age of Onset  . Hypertension Mother   . Diabetes Mother   . Hypertension Father   . Diabetes Father   . Diabetes Maternal Grandmother   . Hypertension Maternal Grandmother   . Diabetes Maternal Grandfather   . Hypertension Maternal Grandfather   . Diabetes Paternal Grandmother   . Hypertension Paternal Grandmother   . Diabetes Paternal Grandfather   . Hypertension Paternal Grandfather   . Asthma Neg Hx     SOCHx:   reports that he has never smoked. He has quit using smokeless tobacco.  His smokeless tobacco use included chew. He reports current alcohol use.  He reports previous drug use. Drug: Marijuana.  ALLERGIES:  Allergies  Allergen Reactions  . Pineapple Itching and Other (See Comments)    All-over itching (mouth, too)  . Tomato Itching and Other (See Comments)    All-over itching (mouth, too) Ketchup, also  . Rosuvastatin Calcium Hives and Rash    ROS: Pertinent items noted in HPI and remainder of comprehensive ROS otherwise negative.  HOME MEDS: Current Outpatient  Medications on File Prior to Visit  Medication Sig Dispense Refill  . acetaminophen (TYLENOL) 650 MG CR tablet Take 1,300 mg by mouth every 8 (eight) hours as needed for pain.    Marland Kitchen albuterol (VENTOLIN HFA) 108 (90 Base) MCG/ACT inhaler Inhale 2 puffs into the lungs every 6 (six) hours as needed for wheezing or shortness of breath.    . ALPRAZolam (XANAX) 0.25 MG tablet Take 0.25 mg by mouth daily as needed.    Marland Kitchen apixaban (ELIQUIS) 5 MG TABS tablet TAKE 1 TABLET BY MOUTH TWO TIMES DAILY 180 tablet 1  . atorvastatin (LIPITOR) 80 MG tablet Take 1 tablet (80 mg total) by mouth daily at 6 PM. 90 tablet 3  . cetirizine (ZYRTEC) 10 MG tablet TAKE 1 TABLET(10 MG) BY MOUTH DAILY 30 tablet 5  . clopidogrel (PLAVIX) 75 MG tablet TAKE 1 TABLET BY MOUTH ONCE DAILY 90 tablet 2  . dapagliflozin propanediol (FARXIGA) 5 MG TABS tablet Take 1 tablet (5 mg total) by mouth daily. 90 tablet 1  . fluticasone (FLONASE) 50 MCG/ACT nasal spray SHAKE LIQUID AND USE 1 SPRAY IN EACH NOSTRIL DAILY (Patient taking differently: Place 2 sprays into both nostrils daily as needed for allergies or rhinitis.) 16 g 2  . ibuprofen (ADVIL) 800 MG tablet TAKE 1 TABLET BY MOUTH EVERY 6 HOURS AS NEEDED FOR PAIN (Patient taking differently: Take by mouth every 6 (six) hours as needed. for pain) 20 tablet 0  . Insulin Glargine (BASAGLAR KWIKPEN) 100 UNIT/ML INJECT 55 UNITS OR AS DIRECTED UNDER THE SKIN DAILY 45 mL 3  . Insulin Glargine-yfgn (SEMGLEE, YFGN,) 100 UNIT/ML SOPN Inject 55 Units into the skin daily or as directed. 45 mL 3  . LANTUS SOLOSTAR 100 UNIT/ML Solostar Pen Inject 55 Units into the skin daily after breakfast. 15 mL 11  . magnesium citrate SOLN Take 1 Bottle by mouth once as needed for mild constipation or moderate constipation.     . metoprolol succinate (TOPROL-XL) 25 MG 24 hr tablet TAKE 1 TABLET BY MOUTH ONCE DAILY 90 tablet 2  . nitroGLYCERIN (NITROSTAT) 0.4 MG SL tablet Place 1 tablet (0.4 mg total) under the tongue  every 5 (five) minutes as needed for chest pain. 25 tablet 3  . pantoprazole (PROTONIX) 40 MG tablet TAKE 1 TABLET (40 MG TOTAL) BY MOUTH 2 (TWO) TIMES DAILY. 60 tablet 5  . sacubitril-valsartan (ENTRESTO) 97-103 MG Take 1 tablet by mouth 2 (two) times daily. 60 tablet 3  . spironolactone (ALDACTONE) 25 MG tablet Take 0.5 tablets (12.5 mg total) by mouth daily. 60 tablet 1   No current facility-administered medications on file prior to visit.    LABS/IMAGING: No results found for this or any previous visit (from the past 48 hour(s)). No results found.  LIPID PANEL:    Component Value Date/Time   CHOL 179 01/28/2021 1000   TRIG 52 01/28/2021 1000   HDL 45 01/28/2021 1000   CHOLHDL 4.0 01/28/2021 1000   CHOLHDL 5.1 05/30/2020 0952   VLDL 15 05/30/2020 0952   LDLCALC 124 (  H) 01/28/2021 1000    WEIGHTS: Wt Readings from Last 3 Encounters:  03/02/21 (!) 314 lb (142.4 kg)  01/28/21 (!) 325 lb (147.4 kg)  09/01/20 (!) 314 lb (142.4 kg)    VITALS: BP (!) 162/84   Pulse 89   Ht 6\' 1"  (1.854 m)   Wt (!) 314 lb (142.4 kg)   SpO2 94%   BMI 41.43 kg/m   EXAM: General appearance: alert, no distress and morbidly obese Neck: no carotid bruit, no JVD and thyroid not enlarged, symmetric, no tenderness/mass/nodules Lungs: clear to auscultation bilaterally Heart: regular rate and rhythm Abdomen: soft, non-tender; bowel sounds normal; no masses,  no organomegaly and obese Extremities: extremities normal, atraumatic, no cyanosis or edema Pulses: 2+ and symmetric Skin: Skin color, texture, turgor normal. No rashes or lesions Neurologic: Grossly normal Psych: Pleasant  EKG: Deferred  ASSESSMENT: 1. Mixed dyslipidemia, goal LDL less than 70 2. Coronary artery disease status post PCI 3. History of atrial flutter 4. Morbid obesity 5. Hypertension 6. Family history of coronary disease  PLAN: 1.   [name] Merritt has had recent increases in his cholesterol.  I suspect this might be  due to less physical activity and the fact that he is not working.  He is starting to exercise more.  He already is compliant with his atorvastatin and ezetimibe.  He remains well above his target LDL less than 70.  Likely need additional treatment.  We discussed a PCSK9 inhibitor and I recommend Repatha 140 mg every 2 weeks.  He can likely discontinue ezetimibe because his cholesterol will be well treated.  Plan follow-up with me in about 3 months after repeat lipids.   Barron Alvine, MD, [facility], FACP  [place]  [facility]  Medical Director of the Advanced Lipid Disorders &  Cardiovascular Risk Reduction Clinic Diplomate of the American Board of Clinical Lipidology Attending Cardiologist  Direct Dial: [phone]  Fax: [phone]  Website:  [email or web address] 03/02/2021, 9:41 AM

## 2021-03-02 NOTE — Patient Instructions (Addendum)
Medication Instructions:   STOP Zetia   Dr. Rennis Golden recommends Repatha 140mg /mL (PCSK9). This is an injectable cholesterol medication self-administered once every 14 days. This medication will likely need prior approval with your insurance company, which we will work on. If the medication is not approved initially, we may need to do an appeal with your insurance.   Administer medication in area of fatty tissue such as abdomen, outer thigh, back of upper arm - and rotate site with each injection Store medication in refrigerator until ready to administer - allow to sit at room temp for 30 mins - 1 hour prior to injection Dispose of medication in a SHARPS container - your pharmacy should be able to direct you on this and proper disposal   If you need co-pay assistance grant, please look into the program at [email or web address] >> disease funds >> hypercholesterolemia. This is an online application or you can call to complete. Once approved, you will provide the "pharmacy card" information to your pharmacy and they will deduct the co-pays from this grant.  If you need a co-pay card for Repatha: >> paying for Repatha or red box that says "Repatha Copay Card" in top right If you need a co-pay card for Praluent: [email or web address] >> starting & paying for Praluent  *If you need a refill on your cardiac medications before your next appointment, please call your pharmacy*   Lab Work: FASTING lab work to check cholesterol in 3-4 months (complete about 1 week before your next visit)   If you have labs (blood work) drawn today and your tests are completely normal, you will receive your results only by: [email or web address] MyChart Message (if you have MyChart) OR . A paper copy in the mail If you have any lab test that is abnormal or we need to change your treatment, we will call you to review the results.   Testing/Procedures: NONE   Follow-Up: At [facility], you and your health needs are our priority.   As part of our continuing mission to provide you with exceptional heart care, we have created designated Provider Care Teams.  These Care Teams include your primary Cardiologist (physician) and Advanced Practice Providers (APPs -  Physician Assistants and Nurse Practitioners) who all work together to provide you with the care you need, when you need it.  We recommend signing up for the patient portal called "MyChart".  Sign up information is provided on this After Visit Summary.  MyChart is used to connect with patients for Virtual Visits (Telemedicine).  Patients are able to view lab/test results, encounter notes, upcoming appointments, etc.  Non-urgent messages can be sent to your provider as well.   To learn more about what you can do with MyChart, go to [facility].    Your next appointment:   3-4 month(s) - [facility]  The format for your next appointment:   In Person  Provider:   [email or web address] Hilty, MD   Other Instructions

## 2021-03-07 ENCOUNTER — Encounter: Payer: Self-pay | Admitting: *Deleted

## 2021-03-07 DIAGNOSIS — Z006 Encounter for examination for normal comparison and control in clinical research program: Secondary | ICD-10-CM

## 2021-03-08 ENCOUNTER — Telehealth: Payer: Self-pay | Admitting: Internal Medicine

## 2021-03-08 ENCOUNTER — Ambulatory Visit (HOSPITAL_COMMUNITY): Payer: 59 | Attending: Cardiology

## 2021-03-08 ENCOUNTER — Other Ambulatory Visit: Payer: Self-pay

## 2021-03-08 ENCOUNTER — Other Ambulatory Visit (HOSPITAL_COMMUNITY): Payer: Self-pay

## 2021-03-08 DIAGNOSIS — I10 Essential (primary) hypertension: Secondary | ICD-10-CM

## 2021-03-08 DIAGNOSIS — E785 Hyperlipidemia, unspecified: Secondary | ICD-10-CM | POA: Diagnosis not present

## 2021-03-08 DIAGNOSIS — I255 Ischemic cardiomyopathy: Secondary | ICD-10-CM | POA: Diagnosis not present

## 2021-03-08 LAB — ECHOCARDIOGRAM COMPLETE
Area-P 1/2: 2.43 cm2
S' Lateral: 3.3 cm

## 2021-03-08 MED ORDER — PERFLUTREN LIPID MICROSPHERE
1.0000 mL | INTRAVENOUS | Status: AC | PRN
  Administered 2021-03-08: 2 mL via INTRAVENOUS

## 2021-03-08 NOTE — Telephone Encounter (Signed)
Spoke with [facility] - no PA required for Repatha Sureclick - was dispensed this week for copay of $5

## 2021-03-09 ENCOUNTER — Telehealth: Payer: Self-pay | Admitting: Cardiovascular Disease

## 2021-03-09 ENCOUNTER — Encounter (HOSPITAL_BASED_OUTPATIENT_CLINIC_OR_DEPARTMENT_OTHER): Payer: Self-pay

## 2021-03-09 ENCOUNTER — Other Ambulatory Visit (HOSPITAL_COMMUNITY): Payer: Self-pay

## 2021-03-09 ENCOUNTER — Telehealth: Payer: Self-pay | Admitting: Podiatrist

## 2021-03-09 ENCOUNTER — Other Ambulatory Visit: Payer: Self-pay | Admitting: Podiatrist

## 2021-03-09 DIAGNOSIS — Z79899 Other long term (current) drug therapy: Secondary | ICD-10-CM

## 2021-03-09 DIAGNOSIS — B351 Tinea unguium: Secondary | ICD-10-CM

## 2021-03-09 MED ORDER — TERBINAFINE HCL 250 MG PO TABS
250.0000 mg | ORAL_TABLET | Freq: Every day | ORAL | 0 refills | Status: DC
  Filled 2021-03-09: qty 90, 90d supply, fill #0

## 2021-03-09 MED FILL — Metoprolol Succinate Tab ER 24HR 25 MG (Tartrate Equiv): ORAL | 90 days supply | Qty: 90 | Fill #0 | Status: AC

## 2021-03-09 MED FILL — Pantoprazole Sodium EC Tab 40 MG (Base Equiv): ORAL | 30 days supply | Qty: 60 | Fill #1 | Status: AC

## 2021-03-09 MED FILL — Clopidogrel Bisulfate Tab 75 MG (Base Equiv): ORAL | 90 days supply | Qty: 90 | Fill #0 | Status: AC

## 2021-03-09 NOTE — Telephone Encounter (Signed)
I spoke with [name] Merritt and informed him his nail sample is positive for fungus.  Oral Lamisil is recommended as the most effective treatment.   I reviewed his most recent labs and ALT and AST are normal.  Will send in requisition to repeat liver function panel in 1 month.  He will also call his cardiologist to make sure they are ok with him being on Lamisil with his Lipitor.

## 2021-03-09 NOTE — Research (Signed)
AEGIS Visit 10 Phone Call:   Patient is doing well overall: he saw Dr. Rennis Golden, in the [facility],  on 03-02-21. Dr. Rennis Golden has stopped his Zetia and started him on Repatha injections. He is not working at this time. Patient tries to eat an heart healthy diet as much as possible. He has seen the Podiatrist for a nail fungus and was prescribed Lamisil. He also has acute planter fasciitis which is making walking very difficult.                                     "CONSENT"   YES     NO   Continuing further Investigational Product and study visits for follow-up? [x]  []   Continuing consent from future biomedical research [x]  []                                      "EVENTS"    YES     NO  AE   (IF YES SEE SOURCE) []  [x]   SAE  (IF YES SEE SOURCE) []  [x]   ENDPOINT   (IF YES SEE SOURCE) []  [x]   REVASCULARIZATION  (IF YES SEE SOURCE) []  [x]   AMPUTATION   (IF YES SEE SOURCE) []  [x]   TROPONIN'S  (IF YES SEE SOURCE) []  [x]    Lifestyle Adherence Assessment:    YES NO  Abstinence from smoking/remaining tobacco free  X  Cardiac Diet X   Routine physical activity and/or cardiac rehabilitation  X     Current Outpatient Medications:  .  acetaminophen (TYLENOL) 650 MG CR tablet, Take 1,300 mg by mouth every 8 (eight) hours as needed for pain., Disp: , Rfl:  .  albuterol (VENTOLIN HFA) 108 (90 Base) MCG/ACT inhaler, Inhale 2 puffs into the lungs every 6 (six) hours as needed for wheezing or shortness of breath., Disp: , Rfl:  .  ALPRAZolam (XANAX) 0.25 MG tablet, Take 0.25 mg by mouth daily as needed., Disp: , Rfl:  .  apixaban (ELIQUIS) 5 MG TABS tablet, TAKE 1 TABLET BY MOUTH TWO TIMES DAILY, Disp: 180 tablet, Rfl: 1 .  atorvastatin (LIPITOR) 80 MG tablet, Take 1 tablet (80 mg total) by mouth daily at 6 PM., Disp: 90 tablet, Rfl: 3 .  cetirizine (ZYRTEC) 10 MG tablet, TAKE 1 TABLET(10 MG) BY MOUTH DAILY, Disp: 30 tablet, Rfl: 5 .  clopidogrel (PLAVIX) 75 MG tablet, TAKE 1 TABLET BY MOUTH ONCE DAILY,  Disp: 90 tablet, Rfl: 2 .  dapagliflozin propanediol (FARXIGA) 5 MG TABS tablet, Take 1 tablet (5 mg total) by mouth daily., Disp: 90 tablet, Rfl: 1 .  Evolocumab (REPATHA SURECLICK) 140 MG/ML SOAJ, Inject 1 Dose into the skin every 14 (fourteen) days., Disp: 2 mL, Rfl: 11 .  fluticasone (FLONASE) 50 MCG/ACT nasal spray, SHAKE LIQUID AND USE 1 SPRAY IN EACH NOSTRIL DAILY (Patient taking differently: Place 2 sprays into both nostrils daily as needed for allergies or rhinitis.), Disp: 16 g, Rfl: 2 .  ibuprofen (ADVIL) 800 MG tablet, TAKE 1 TABLET BY MOUTH EVERY 6 HOURS AS NEEDED FOR PAIN (Patient taking differently: Take by mouth every 6 (six) hours as needed. for pain), Disp: 20 tablet, Rfl: 0 .  Insulin Glargine (BASAGLAR KWIKPEN) 100 UNIT/ML, INJECT 55 UNITS OR AS DIRECTED UNDER THE SKIN DAILY, Disp: 45 mL, Rfl: 3 .  Insulin Glargine-yfgn (SEMGLEE,  YFGN,) 100 UNIT/ML SOPN, Inject 55 Units into the skin daily or as directed., Disp: 45 mL, Rfl: 3 .  LANTUS SOLOSTAR 100 UNIT/ML Solostar Pen, Inject 55 Units into the skin daily after breakfast., Disp: 15 mL, Rfl: 11 .  magnesium citrate SOLN, Take 1 Bottle by mouth once as needed for mild constipation or moderate constipation. , Disp: , Rfl:  .  metoprolol succinate (TOPROL-XL) 25 MG 24 hr tablet, TAKE 1 TABLET BY MOUTH ONCE DAILY, Disp: 90 tablet, Rfl: 2 .  nitroGLYCERIN (NITROSTAT) 0.4 MG SL tablet, Place 1 tablet (0.4 mg total) under the tongue every 5 (five) minutes as needed for chest pain., Disp: 25 tablet, Rfl: 3 .  pantoprazole (PROTONIX) 40 MG tablet, TAKE 1 TABLET (40 MG TOTAL) BY MOUTH 2 (TWO) TIMES DAILY., Disp: 60 tablet, Rfl: 5 .  sacubitril-valsartan (ENTRESTO) 97-103 MG, Take 1 tablet by mouth 2 (two) times daily., Disp: 60 tablet, Rfl: 3 .  spironolactone (ALDACTONE) 25 MG tablet, Take 0.5 tablets (12.5 mg total) by mouth daily., Disp: 60 tablet, Rfl: 1 .  terbinafine (LAMISIL) 250 MG tablet, Take 1 tablet (250 mg total) by mouth daily.  Repeat bloodwork after 30 days of treatment, Disp: 90 tablet, Rfl: 0

## 2021-03-09 NOTE — Telephone Encounter (Signed)
Called patient, gave response from PharmD.  Patient verbalized understanding.  

## 2021-03-09 NOTE — Telephone Encounter (Signed)
Pt c/o medication issue:  1. Name of Medication : terbinafine (LAMISIL) 250 MG tablet  2. How are you currently taking this medication (dosage and times per day)? Take 1 tablet (250 mg total) by mouth daily. Repeat bloodwork after 30 days of treatment  3. Are you having a reaction (difficulty breathing--STAT)?N/A  4. What is your medication issue? PTS WIFE IS CALLING STATING PTS PODIATRIST IS WANTING TO PRESCRIBE terbinafine (LAMISIL) 250 MG tablet, PT WANTS TO MAKE SURE THIS IS OK TO TAKE DUE TO HEART CONDITON

## 2021-03-09 NOTE — Telephone Encounter (Signed)
Will route to PharmD to advise. Thanks!   

## 2021-03-09 NOTE — Telephone Encounter (Signed)
Should be no issues with taking terbinafine

## 2021-03-10 ENCOUNTER — Other Ambulatory Visit (HOSPITAL_COMMUNITY): Payer: Self-pay

## 2021-03-10 ENCOUNTER — Other Ambulatory Visit: Payer: Self-pay | Admitting: Medical

## 2021-03-10 NOTE — Telephone Encounter (Signed)
This is Dr. Kelly's pt. °

## 2021-03-11 ENCOUNTER — Other Ambulatory Visit (HOSPITAL_COMMUNITY): Payer: Self-pay

## 2021-03-14 ENCOUNTER — Other Ambulatory Visit (HOSPITAL_COMMUNITY): Payer: Self-pay

## 2021-03-14 ENCOUNTER — Telehealth: Payer: Self-pay | Admitting: Cardiovascular Disease

## 2021-03-14 ENCOUNTER — Other Ambulatory Visit: Payer: Self-pay

## 2021-03-14 MED ORDER — DAPAGLIFLOZIN PROPANEDIOL 5 MG PO TABS
5.0000 mg | ORAL_TABLET | Freq: Every day | ORAL | 1 refills | Status: DC
  Filled 2021-03-14: qty 30, 30d supply, fill #0
  Filled 2021-03-15: qty 90, 90d supply, fill #0
  Filled 2021-07-13: qty 90, 90d supply, fill #1

## 2021-03-14 NOTE — Telephone Encounter (Signed)
Contacted patient and spoke to wife- advised that we had not received anything from the pharmacy. But I did not see a reason it was denied, so I sent RX over- they are following up for sleep medicine, and then will see Dr.Kelly after this is completed based on results.  Dr.Hilty appointment is scheduled as well.

## 2021-03-14 NOTE — Telephone Encounter (Signed)
New message:      Wife is calling to say pt's Dennison Mascot was denied and she wanted to know why please.

## 2021-03-15 ENCOUNTER — Other Ambulatory Visit (HOSPITAL_COMMUNITY): Payer: Self-pay

## 2021-03-23 ENCOUNTER — Ambulatory Visit: Payer: 59 | Admitting: Podiatry

## 2021-03-28 ENCOUNTER — Ambulatory Visit (HOSPITAL_BASED_OUTPATIENT_CLINIC_OR_DEPARTMENT_OTHER): Payer: 59 | Attending: Cardiovascular Disease | Admitting: Cardiovascular Disease

## 2021-03-28 ENCOUNTER — Other Ambulatory Visit: Payer: Self-pay

## 2021-03-28 DIAGNOSIS — G4733 Obstructive sleep apnea (adult) (pediatric): Secondary | ICD-10-CM

## 2021-03-30 ENCOUNTER — Other Ambulatory Visit (HOSPITAL_COMMUNITY): Payer: Self-pay

## 2021-04-08 ENCOUNTER — Other Ambulatory Visit (HOSPITAL_COMMUNITY): Payer: Self-pay

## 2021-04-15 ENCOUNTER — Encounter (HOSPITAL_BASED_OUTPATIENT_CLINIC_OR_DEPARTMENT_OTHER): Payer: Self-pay | Admitting: Cardiovascular Disease

## 2021-04-15 NOTE — Procedures (Signed)
Patient Name: [name] Merritt, [name] Merritt Date: 03/28/2021 Gender: Male D.O.B: 1970-11-24 Age (years): 49 Referring Provider: Shelva Majestic MD, ABSM Height (inches): 73 Interpreting Physician: Shelva Majestic MD, ABSM Weight (lbs): 315 RPSGT: Jorge Ny BMI: 42 MRN: [id number] Neck Size: 18.00  CLINICAL INFORMATION Sleep Study Type: Split Night CPAP  Indication for sleep study: Congestive Heart Failure, Diabetes, Hypertension, Obesity, Re-Evaluation, Snoring, Witnessed Apneas  Epworth Sleepiness Score: 12  SLEEP STUDY TECHNIQUE As per the AASM Manual for the Scoring of Sleep and Associated Events v2.3 (April 2016) with a hypopnea requiring 4% desaturations.  The channels recorded and monitored were frontal, central and occipital EEG, electrooculogram (EOG), submentalis EMG (chin), nasal and oral airflow, thoracic and abdominal wall motion, anterior tibialis EMG, snore microphone, electrocardiogram, and pulse oximetry. Continuous positive airway pressure (CPAP) was initiated when the patient met split night criteria and was titrated according to treat sleep-disordered breathing.  MEDICATIONS acetaminophen (TYLENOL) 650 MG CR tablet albuterol (VENTOLIN HFA) 108 (90 Base) MCG/ACT inhaler ALPRAZolam (XANAX) 0.25 MG tablet apixaban (ELIQUIS) 5 MG TABS tablet atorvastatin (LIPITOR) 80 MG tablet cetirizine (ZYRTEC) 10 MG tablet clopidogrel (PLAVIX) 75 MG tablet dapagliflozin propanediol (FARXIGA) 5 MG TABS table Evolocumab (REPATHA SURECLICK) 625 MG/ML SOAJ fluticasone (FLONASE) 50 MCG/ACT nasal spray ibuprofen (ADVIL) 800 MG tablet Insulin Glargine (BASAGLAR KWIKPEN) 100 UNIT/ML Insulin Glargine-yfgn (SEMGLEE, YFGN,) 100 UNIT/ML SOPN LANTUS SOLOSTAR 100 UNIT/ML Solostar Pen magnesium citrate SOLN metoprolol succinate (TOPROL-XL) 25 MG 24 hr tablet nitroGLYCERIN (NITROSTAT) 0.4 MG SL tablet pantoprazole (PROTONIX) 40 MG tablet sacubitril-valsartan (ENTRESTO) 97-103  MG spironolactone (ALDACTONE) 25 MG tablet terbinafine (LAMISIL) 250 MG tablet  Medications self-administered by patient taken the night of the study : PANTOPRAZOLE SODIUM, ELIQUIS, ENTRESTO, TYLENOL PM, INSULIN GLARGINE  RESPIRATORY PARAMETERS Diagnostic Total AHI (/hr): 35.5 RDI (/hr): 38.7 OA Index (/hr): 0.4 CA Index (/hr): 3.2 REM AHI (/hr): 91.4 NREM AHI (/hr): 31.3 Supine AHI (/hr): 36.5 Non-supine AHI (/hr): 33.6 Min O2 Sat (%): 79.0 Mean O2 (%): 91.7 Time below 88% (min): 8  Titration Optimal Pressure (cm): 14 AHI at Optimal Pressure (/hr): 0 Min O2 at Optimal Pressure (%): 93.0 Supine % at Optimal (%): 0 Sleep % at Optimal (%): 98   SLEEP ARCHITECTURE The recording time for the entire night was 446.9 minutes.  During a baseline period of 167.4 minutes, the patient slept for 150.5 minutes in REM and nonREM, yielding a sleep efficiency of 89.9%%. Sleep onset after lights out was 2.5 minutes with a REM latency of 73.0 minutes. The patient spent 5.6%% of the night in stage N1 sleep, 87.4%% in stage N2 sleep, 0.0%% in stage N3 and 7% in REM.  During the titration period of 273.3 minutes, the patient slept for 226.5 minutes in REM and nonREM, yielding a sleep efficiency of 82.9%%. Sleep onset after CPAP initiation was 16.4 minutes with a REM latency of 41.0 minutes. The patient spent 5.1%% of the night in stage N1 sleep, 79.0%% in stage N2 sleep, 0.0%% in stage N3 and 15.9% in REM.  CARDIAC DATA The 2 lead EKG demonstrated sinus rhythm. The mean heart rate was 100.0 beats per minute. Other EKG findings include: None.  LEG MOVEMENT DATA The total Periodic Limb Movements of Sleep (PLMS) were 0. The PLMS index was 0.0 .  IMPRESSIONS - Severe obstructive sleep apnea occurred during the diagnostic portion of the study (AHI  35.5/h; RDI 38.7/h); events were very severe during REM sleep (AHI 91.4/h).  CPAP was initiated at 6 cm and  was titrated to optimal PAP pressure (14 cm of water). AHI  0 at 14 cm without REM sleep, and 0.8/h at 12 cm with minimal REM sleep. Snoring persisted until 14 cm of water. - No significant central sleep apnea occurred during the diagnostic portion of the study (CAI 3.2/hour). - Mild oxygen desaturation was noted during the diagnostic portion of the study to a nadir of 79.0%. - The patient snored with loud snoring volume during the diagnostic portion of the study. - No cardiac abnormalities were noted during this study. - Clinically significant periodic limb movements did not occur during sleep.  DIAGNOSIS - Obstructive Sleep Apnea (G47.33)  RECOMMENDATIONS - Recommend an initial trial of CPAP Auto therapy with EPR of 3 at 12 - 18 cm of water with heated humidification. A medium Wide Cushion size [employer] Respironics Minimal Museum/gallery curator Under Nose Frame (M) mask was used for the titration. - Effort should be made to optimize nasal and oropharyngeal patency. - Avoid alcohol, sedatives and other CNS depressants that may worsen sleep apnea and disrupt normal sleep architecture. - Sleep hygiene should be reviewed to assess factors that may improve sleep quality. - Weight management (BMI 42) and regular exercise should be initiated or continued. - Recommend a download in 30 days and sleep clinic evaluation after 4 weeks of therapy.  [Electronically signed] 04/15/2021 04:36 PM  Shelva Majestic MD, [facility], [place], American Board of Sleep Medicine   NPI: [id number] [place] PH: [phone]   FX: [phone] [place]

## 2021-04-20 MED FILL — Pantoprazole Sodium EC Tab 40 MG (Base Equiv): ORAL | 30 days supply | Qty: 60 | Fill #2 | Status: AC

## 2021-04-21 ENCOUNTER — Other Ambulatory Visit (HOSPITAL_COMMUNITY): Payer: Self-pay

## 2021-04-26 ENCOUNTER — Other Ambulatory Visit (HOSPITAL_COMMUNITY): Payer: Self-pay

## 2021-05-02 ENCOUNTER — Other Ambulatory Visit (HOSPITAL_COMMUNITY): Payer: Self-pay

## 2021-05-03 ENCOUNTER — Telehealth: Payer: Self-pay | Admitting: *Deleted

## 2021-05-03 ENCOUNTER — Other Ambulatory Visit (HOSPITAL_COMMUNITY): Payer: Self-pay

## 2021-05-03 ENCOUNTER — Other Ambulatory Visit: Payer: Self-pay | Admitting: Cardiovascular Disease

## 2021-05-03 DIAGNOSIS — G4733 Obstructive sleep apnea (adult) (pediatric): Secondary | ICD-10-CM

## 2021-05-03 NOTE — Telephone Encounter (Signed)
Due to patient's insurance CPAP order will be sent to [facility].

## 2021-05-03 NOTE — Telephone Encounter (Signed)
Patient notified sleep study still shows that he has severe sleep apnea. CPAP orders will be sent to [facility]. Patient agrees to proceed.

## 2021-05-03 NOTE — Telephone Encounter (Signed)
-----   Message from Lennette Bihari, MD sent at 04/15/2021  4:42 PM EDT ----- Burna Mortimer, please notify pt and set up with DME for CPAP initiation.

## 2021-05-04 MED FILL — Apixaban Tab 5 MG: ORAL | 90 days supply | Qty: 180 | Fill #0 | Status: AC

## 2021-05-05 ENCOUNTER — Other Ambulatory Visit (HOSPITAL_COMMUNITY): Payer: Self-pay

## 2021-05-06 ENCOUNTER — Other Ambulatory Visit (HOSPITAL_COMMUNITY): Payer: Self-pay

## 2021-05-25 MED FILL — Pantoprazole Sodium EC Tab 40 MG (Base Equiv): ORAL | 30 days supply | Qty: 60 | Fill #3 | Status: AC

## 2021-05-26 ENCOUNTER — Other Ambulatory Visit (HOSPITAL_COMMUNITY): Payer: Self-pay

## 2021-05-27 ENCOUNTER — Other Ambulatory Visit (HOSPITAL_COMMUNITY): Payer: Self-pay

## 2021-05-31 ENCOUNTER — Other Ambulatory Visit (HOSPITAL_COMMUNITY): Payer: Self-pay

## 2021-06-07 ENCOUNTER — Other Ambulatory Visit: Payer: Self-pay

## 2021-06-07 ENCOUNTER — Encounter: Payer: Self-pay | Admitting: *Deleted

## 2021-06-07 DIAGNOSIS — Z006 Encounter for examination for normal comparison and control in clinical research program: Secondary | ICD-10-CM

## 2021-06-07 NOTE — Research (Addendum)
AEGIS VISIT 11 EOS  Visit was completed by phone due to patient's wife testing positive the morning of his original appointment on 07-Jun-2021 for EOS visit.  Patient called to tell us his wife tested positive and that he wouldn't be able to come for his End of Study visit.                                      "CONSENT"   YES     NO   Continuing further Investigational Product and study visits for follow-up? [x]  []   Continuing consent from future biomedical research [x]  []                                      "EVENTS"    YES     NO  AE   (IF YES SEE SOURCE) []  [x]   SAE  (IF YES SEE SOURCE) []  [x]   ENDPOINT   (IF YES SEE SOURCE) []  [x]   REVASCULARIZATION  (IF YES SEE SOURCE) []  [x]   AMPUTATION   (IF YES SEE SOURCE) []  [x]   TROPONIN'S  (IF YES SEE SOURCE) []  [x]      Lifestyle Adherence Assessment:    YES NO  Abstinence from smoking/remaining tobacco free X   Cardiac Diet X   Routine physical activity and/or cardiac rehabilitation X      Outpatient Encounter Medications as of 06/07/2021  Medication Sig   acetaminophen (TYLENOL) 650 MG CR tablet Take 1,300 mg by mouth every 8 (eight) hours as needed for pain.   albuterol (VENTOLIN HFA) 108 (90 Base) MCG/ACT inhaler Inhale 2 puffs into the lungs every 6 (six) hours as needed for wheezing or shortness of breath.   ALPRAZolam (XANAX) 0.25 MG tablet Take 0.25 mg by mouth daily as needed.   apixaban (ELIQUIS) 5 MG TABS tablet TAKE 1 TABLET BY MOUTH TWO TIMES DAILY   atorvastatin (LIPITOR) 80 MG tablet Take 1 tablet (80 mg total) by mouth daily at 6 PM.   cetirizine (ZYRTEC) 10 MG tablet TAKE 1 TABLET(10 MG) BY MOUTH DAILY   clopidogrel (PLAVIX) 75 MG tablet TAKE 1 TABLET BY MOUTH ONCE DAILY   dapagliflozin propanediol (FARXIGA) 5 MG TABS tablet Take 1 tablet (5 mg total) by mouth daily.   Evolocumab (REPATHA SURECLICK) 140 MG/ML SOAJ Inject 1 Dose into the skin every 14 (fourteen) days.   fluticasone (FLONASE) 50 MCG/ACT nasal spray SHAKE  LIQUID AND USE 1 SPRAY IN EACH NOSTRIL DAILY (Patient taking differently: Place 2 sprays into both nostrils daily as needed for allergies or rhinitis.)   ibuprofen (ADVIL) 800 MG tablet TAKE 1 TABLET BY MOUTH EVERY 6 HOURS AS NEEDED FOR PAIN (Patient taking differently: Take by mouth every 6 (six) hours as needed. for pain)   Insulin Glargine (BASAGLAR KWIKPEN) 100 UNIT/ML INJECT 55 UNITS OR AS DIRECTED UNDER THE SKIN DAILY   insulin glargine-yfgn (SEMGLEE, YFGN,) 100 UNIT/ML Pen Inject 55 Units into the skin daily or as directed.   LANTUS SOLOSTAR 100 UNIT/ML Solostar Pen Inject 55 Units into the skin daily after breakfast.   magnesium citrate SOLN Take 1 Bottle by mouth once as needed for mild constipation or moderate constipation.    metoprolol succinate (TOPROL-XL) 25 MG 24 hr tablet TAKE 1 TABLET BY MOUTH ONCE DAILY   nitroGLYCERIN (NITROSTAT) 0.4 MG SL tablet Place  1 tablet (0.4 mg total) under the tongue every 5 (five) minutes as needed for chest pain.   pantoprazole (PROTONIX) 40 MG tablet TAKE 1 TABLET (40 MG TOTAL) BY MOUTH 2 (TWO) TIMES DAILY.   sacubitril-valsartan (ENTRESTO) 97-103 MG Take 1 tablet by mouth 2 (two) times daily.   spironolactone (ALDACTONE) 25 MG tablet Take 0.5 tablets (12.5 mg total) by mouth daily.   terbinafine (LAMISIL) 250 MG tablet Take 1 tablet (250 mg total) by mouth daily. Repeat bloodwork after 30 days of treatment   No facility-administered encounter medications on file as of 06/07/2021.

## 2021-06-16 ENCOUNTER — Other Ambulatory Visit: Payer: Self-pay | Admitting: Cardiology

## 2021-06-16 ENCOUNTER — Other Ambulatory Visit: Payer: Self-pay | Admitting: Podiatrist

## 2021-06-16 ENCOUNTER — Other Ambulatory Visit (HOSPITAL_COMMUNITY): Payer: Self-pay

## 2021-06-16 DIAGNOSIS — E1169 Type 2 diabetes mellitus with other specified complication: Secondary | ICD-10-CM | POA: Diagnosis not present

## 2021-06-16 DIAGNOSIS — I255 Ischemic cardiomyopathy: Secondary | ICD-10-CM | POA: Diagnosis not present

## 2021-06-16 DIAGNOSIS — I4892 Unspecified atrial flutter: Secondary | ICD-10-CM | POA: Diagnosis not present

## 2021-06-16 DIAGNOSIS — G4733 Obstructive sleep apnea (adult) (pediatric): Secondary | ICD-10-CM | POA: Diagnosis not present

## 2021-06-16 DIAGNOSIS — I2510 Atherosclerotic heart disease of native coronary artery without angina pectoris: Secondary | ICD-10-CM | POA: Diagnosis not present

## 2021-06-16 DIAGNOSIS — I10 Essential (primary) hypertension: Secondary | ICD-10-CM | POA: Diagnosis not present

## 2021-06-16 DIAGNOSIS — E7800 Pure hypercholesterolemia, unspecified: Secondary | ICD-10-CM | POA: Diagnosis not present

## 2021-06-16 DIAGNOSIS — Z7984 Long term (current) use of oral hypoglycemic drugs: Secondary | ICD-10-CM | POA: Diagnosis not present

## 2021-06-16 DIAGNOSIS — Z0000 Encounter for general adult medical examination without abnormal findings: Secondary | ICD-10-CM | POA: Diagnosis not present

## 2021-06-16 MED ORDER — METOPROLOL SUCCINATE ER 25 MG PO TB24
25.0000 mg | ORAL_TABLET | Freq: Every day | ORAL | 2 refills | Status: DC
  Filled 2021-06-16: qty 90, 90d supply, fill #0
  Filled 2021-09-16: qty 90, 90d supply, fill #1
  Filled 2021-12-15: qty 90, 90d supply, fill #2

## 2021-06-16 MED ORDER — CLOPIDOGREL BISULFATE 75 MG PO TABS
75.0000 mg | ORAL_TABLET | Freq: Every day | ORAL | 2 refills | Status: DC
  Filled 2021-06-16: qty 90, 90d supply, fill #0
  Filled 2021-09-21: qty 90, 90d supply, fill #1
  Filled 2022-02-08: qty 90, 90d supply, fill #2

## 2021-06-16 NOTE — Telephone Encounter (Signed)
Please advise 

## 2021-06-17 ENCOUNTER — Other Ambulatory Visit (HOSPITAL_COMMUNITY): Payer: Self-pay

## 2021-06-17 ENCOUNTER — Telehealth: Payer: Self-pay | Admitting: Podiatrist

## 2021-06-17 ENCOUNTER — Other Ambulatory Visit: Payer: Self-pay | Admitting: Podiatrist

## 2021-06-17 MED ORDER — TERBINAFINE HCL 250 MG PO TABS
250.0000 mg | ORAL_TABLET | Freq: Every day | ORAL | 0 refills | Status: DC
  Filled 2021-06-17: qty 90, 90d supply, fill #0

## 2021-06-17 NOTE — Telephone Encounter (Signed)
Spoke with Keith Rake regarding Lamisil refill request-  he states the medication has caused no side effects and nails are beginning to clear.  Refill approved. If no improvement after second round, he will call for appointment.

## 2021-06-23 DIAGNOSIS — H4311 Vitreous hemorrhage, right eye: Secondary | ICD-10-CM | POA: Diagnosis not present

## 2021-06-23 DIAGNOSIS — E113593 Type 2 diabetes mellitus with proliferative diabetic retinopathy without macular edema, bilateral: Secondary | ICD-10-CM | POA: Diagnosis not present

## 2021-06-24 ENCOUNTER — Other Ambulatory Visit (HOSPITAL_COMMUNITY): Payer: Self-pay

## 2021-06-24 ENCOUNTER — Encounter (INDEPENDENT_AMBULATORY_CARE_PROVIDER_SITE_OTHER): Payer: Self-pay

## 2021-06-29 ENCOUNTER — Encounter (INDEPENDENT_AMBULATORY_CARE_PROVIDER_SITE_OTHER): Payer: Self-pay | Admitting: Ophthalmology

## 2021-06-29 ENCOUNTER — Other Ambulatory Visit: Payer: Self-pay

## 2021-06-29 ENCOUNTER — Ambulatory Visit (INDEPENDENT_AMBULATORY_CARE_PROVIDER_SITE_OTHER): Payer: 59 | Admitting: Ophthalmology

## 2021-06-29 DIAGNOSIS — E113591 Type 2 diabetes mellitus with proliferative diabetic retinopathy without macular edema, right eye: Secondary | ICD-10-CM | POA: Insufficient documentation

## 2021-06-29 DIAGNOSIS — E113592 Type 2 diabetes mellitus with proliferative diabetic retinopathy without macular edema, left eye: Secondary | ICD-10-CM

## 2021-06-29 DIAGNOSIS — H4311 Vitreous hemorrhage, right eye: Secondary | ICD-10-CM | POA: Diagnosis not present

## 2021-06-29 DIAGNOSIS — H25041 Posterior subcapsular polar age-related cataract, right eye: Secondary | ICD-10-CM | POA: Diagnosis not present

## 2021-06-29 DIAGNOSIS — G4733 Obstructive sleep apnea (adult) (pediatric): Secondary | ICD-10-CM

## 2021-06-29 DIAGNOSIS — H25042 Posterior subcapsular polar age-related cataract, left eye: Secondary | ICD-10-CM

## 2021-06-29 NOTE — Assessment & Plan Note (Signed)
Minor OD 

## 2021-06-29 NOTE — Assessment & Plan Note (Signed)
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

## 2021-06-29 NOTE — Assessment & Plan Note (Addendum)
Moderate likely to need surgical intervention in the near future

## 2021-06-29 NOTE — Assessment & Plan Note (Signed)
Extensive peripheral nonperfusion and will need peripheral PRP in the very near future

## 2021-06-29 NOTE — Assessment & Plan Note (Addendum)
OD with widespread peripheral neovascularization signifying extensive retinal nonperfusion.  With large frond nasal to nerve which could trigger the crunch syndrome should injection of antivegF be delivered.  For this reason we will deliver more permanent and yet slower acting peripheral PRP which will gently decrease vegF burden and hopefully prevent the crunch syndrome developing as seen with antivegF delivery immediately.  Moreover I explained the patient that peripheral vitreous hemorrhage could proceed progress with PVD progression which is not from the laser but in fact and natural progression developing which could cause a decline in acuity OD temporarily.  Temporarily on the basis that this could also be surgically remedied easily.  The nature of proliferative diabetic retinopathy was discussed with the patient as well as the potential for a vitreous hemorrhage and traction on the retina. Tight control of glucose, blood pressure, and serum lipids was recommended in order to slow further progression of disease. Cessation of smoking was recommended. Treatment options including ocular injectable medications, panretinal photocoagulation and/or vitrectomy surgery for advanced disease were discussed. Local medical therapy may slow or arrest progression of disease.

## 2021-06-29 NOTE — Assessment & Plan Note (Signed)
I encouraged the patient to commence with CPAP therapy as prompt as possible reviewing at length the benefits thereof to preventing nightly hypoxic damage and exacerbation of retinopathy from diabetic eye disease  Systemic benefits are well-known to him

## 2021-06-29 NOTE — Progress Notes (Signed)
06/29/2021     CHIEF COMPLAINT Patient presents for  Chief Complaint  Patient presents with   Retina Evaluation      HISTORY OF PRESENT ILLNESS: [name] Merritt is a 50 y.o. male who presents to the clinic today for:   HPI     Retina Evaluation   In right eye.  This started 2 weeks ago.  Duration of 2 weeks.        Comments   New Patient presents for Retinal Evaluation for New VH in the right eye associated with PDR without macular edema in both eyes.   Pt states the blood first appeared in the right eye~ 2 weeks ago, came on suddenly. Pt c/o having having a large blurred spot in his vision  that diminished in size over the past 2 weeks.       Last edited by Frederik Pear, COA on 06/29/2021  8:08 AM.      Referring physician: Renford Dills, MD [address] [place],  [place] [address]  HISTORICAL INFORMATION:   Selected notes from the MEDICAL RECORD NUMBER    Lab Results  Component Value Date   HGBA1C 6.7 (H) 01/28/2021     CURRENT MEDICATIONS: No current outpatient medications on file. (Ophthalmic Drugs)   No current facility-administered medications for this visit. (Ophthalmic Drugs)   Current Outpatient Medications (Other)  Medication Sig   acetaminophen (TYLENOL) 650 MG CR tablet Take 1,300 mg by mouth every 8 (eight) hours as needed for pain.   albuterol (VENTOLIN HFA) 108 (90 Base) MCG/ACT inhaler Inhale 2 puffs into the lungs every 6 (six) hours as needed for wheezing or shortness of breath.   ALPRAZolam (XANAX) 0.25 MG tablet Take 0.25 mg by mouth daily as needed.   apixaban (ELIQUIS) 5 MG TABS tablet TAKE 1 TABLET BY MOUTH TWO TIMES DAILY   atorvastatin (LIPITOR) 80 MG tablet Take 1 tablet (80 mg total) by mouth daily at 6 PM.   cetirizine (ZYRTEC) 10 MG tablet TAKE 1 TABLET(10 MG) BY MOUTH DAILY   clopidogrel (PLAVIX) 75 MG tablet Take 1 tablet (75 mg total) by mouth daily.   dapagliflozin propanediol (FARXIGA) 5 MG TABS tablet  Take 1 tablet (5 mg total) by mouth daily.   Evolocumab (REPATHA SURECLICK) 140 MG/ML SOAJ Inject 1 Dose into the skin every 14 (fourteen) days.   fluticasone (FLONASE) 50 MCG/ACT nasal spray SHAKE LIQUID AND USE 1 SPRAY IN EACH NOSTRIL DAILY (Patient taking differently: Place 2 sprays into both nostrils daily as needed for allergies or rhinitis.)   ibuprofen (ADVIL) 800 MG tablet TAKE 1 TABLET BY MOUTH EVERY 6 HOURS AS NEEDED FOR PAIN (Patient taking differently: Take by mouth every 6 (six) hours as needed. for pain)   Insulin Glargine (BASAGLAR KWIKPEN) 100 UNIT/ML INJECT 55 UNITS OR AS DIRECTED UNDER THE SKIN DAILY   insulin glargine-yfgn (SEMGLEE, YFGN,) 100 UNIT/ML Pen Inject 55 Units into the skin daily or as directed.   LANTUS SOLOSTAR 100 UNIT/ML Solostar Pen Inject 55 Units into the skin daily after breakfast.   magnesium citrate SOLN Take 1 Bottle by mouth once as needed for mild constipation or moderate constipation.    metoprolol succinate (TOPROL-XL) 25 MG 24 hr tablet Take 1 tablet (25 mg total) by mouth daily.   nitroGLYCERIN (NITROSTAT) 0.4 MG SL tablet Place 1 tablet (0.4 mg total) under the tongue every 5 (five) minutes as needed for chest pain.   pantoprazole (PROTONIX) 40 MG tablet  TAKE 1 TABLET (40 MG TOTAL) BY MOUTH 2 (TWO) TIMES DAILY.   sacubitril-valsartan (ENTRESTO) 97-103 MG Take 1 tablet by mouth 2 (two) times daily.   spironolactone (ALDACTONE) 25 MG tablet Take 0.5 tablets (12.5 mg total) by mouth daily.   terbinafine (LAMISIL) 250 MG tablet Take 1 tablet (250 mg total) by mouth daily. Repeat bloodwork after 30 days of treatment   No current facility-administered medications for this visit. (Other)      REVIEW OF SYSTEMS:    ALLERGIES Allergies  Allergen Reactions   Pineapple Itching and Other (See Comments)    All-over itching (mouth, too)   Tomato Itching and Other (See Comments)    All-over itching (mouth, too) Ketchup, also   Rosuvastatin Calcium Hives  and Rash    PAST MEDICAL HISTORY Past Medical History:  Diagnosis Date   Coronary artery disease    Diabetes mellitus without complication (HCC)    High cholesterol    Hypertension    Myocardial infarction [facility])    New onset atrial flutter (HCC) 06/09/2020   Overweight    Past Surgical History:  Procedure Laterality Date   CARDIOVERSION N/A 06/10/2020   Procedure: CARDIOVERSION;  Surgeon: Little Ishikawa, MD;  Location: [facility] ENDOSCOPY;  Service: Cardiovascular;  Laterality: N/A;   CORONARY STENT INTERVENTION N/A 05/30/2020   Procedure: CORONARY STENT INTERVENTION;  Surgeon: Lennette Bihari, MD;  Location: [facility] INVASIVE CV LAB;  Service: Cardiovascular;  Laterality: N/A;   CORONARY/GRAFT ACUTE MI REVASCULARIZATION N/A 05/30/2020   Procedure: Coronary/Graft Acute MI Revascularization;  Surgeon: Lennette Bihari, MD;  Location: [facility] INVASIVE CV LAB;  Service: Cardiovascular;  Laterality: N/A;   LEFT HEART CATH AND CORONARY ANGIOGRAPHY N/A 05/30/2020   Procedure: LEFT HEART CATH AND CORONARY ANGIOGRAPHY;  Surgeon: Lennette Bihari, MD;  Location: [facility] INVASIVE CV LAB;  Service: Cardiovascular;  Laterality: N/A;   TEE WITHOUT CARDIOVERSION N/A 06/10/2020   Procedure: TRANSESOPHAGEAL ECHOCARDIOGRAM (TEE);  Surgeon: Little Ishikawa, MD;  Location: [facility] ENDOSCOPY;  Service: Cardiovascular;  Laterality: N/A;   torn rotator cuff  2018   2018    FAMILY HISTORY Family History  Problem Relation Age of Onset   Hypertension Mother    Diabetes Mother    Hypertension Father    Diabetes Father    Diabetes Maternal Grandmother    Hypertension Maternal Grandmother    Diabetes Maternal Grandfather    Hypertension Maternal Grandfather    Diabetes Paternal Grandmother    Hypertension Paternal Grandmother    Diabetes Paternal Grandfather    Hypertension Paternal Grandfather    Asthma Neg Hx     SOCIAL HISTORY Social History   Tobacco Use   Smoking status: Never   Smokeless tobacco: Former     Types: Associate Professor Use: Never used  Substance Use Topics   Alcohol use: Yes    Comment: occasionally   Drug use: Not Currently    Types: Marijuana    Comment: quit 10-15 years ago         OPHTHALMIC EXAM:  Base Eye Exam     Visual Acuity (ETDRS)       Right Left   Dist [place] 20/50 -1 20/50 -2   Dist ph [place] 20/40 -1 20/25 -1         Tonometry (Tonopen, 8:14 AM)       Right Left   Pressure 7` 12         Pupils  Pupils Dark Light Shape React APD   Right PERRL 3 2 Round Brisk None   Left PERRL 3 2 Round Brisk None         Visual Fields (Counting fingers)       Left Right    Full Full         Extraocular Movement       Right Left    Full, Ortho Full, Ortho         Neuro/Psych     Oriented x3: Yes   Mood/Affect: Normal         Dilation     Both eyes: 1.0% Mydriacyl, 2.5% Phenylephrine @ 8:14 AM           Slit Lamp and Fundus Exam     External Exam       Right Left   External Normal Normal         Slit Lamp Exam       Right Left   Lids/Lashes Normal Normal   Conjunctiva/Sclera White and quiet White and quiet   Cornea Clear Clear   Anterior Chamber Deep and quiet Deep and quiet   Iris Round and reactive Round and reactive   Lens Clear Clear   Anterior Vitreous Normal Normal         Fundus Exam       Right Left   Posterior Vitreous Pre-retinal hemorrhage Normal   Disc Neovascularization Normal   C/D Ratio 0.2 0.2   Macula Normal Normal   Vessels PDR-active PDR-active, NVD superonasal and superotemporal   Periphery Normal, extensive retinal nonperfusion, large NVE at the equator nasally as well as in the macula posteriorly.  Fibrous tissue superonasal to the nerve Extensive retinal nonperfusion peripherally            IMAGING AND PROCEDURES  Imaging and Procedures for 06/29/21  OCT, Retina - OU - Both Eyes       Right Eye Quality was good. Scan locations included subfoveal. Progression  has no prior data.   Left Eye Quality was good. Scan locations included subfoveal. Central Foveal Thickness: 317. Progression has no prior data.   Notes Partial PVD with inferior preretinal hemorrhage OD  Minor CSME temporally OS.        Color Fundus Photography Optos - OU - Both Eyes       Right Eye Progression has no prior data. Disc findings include neovascularization. Macula : microaneurysms. Vessels : Neovascularization.   Left Eye Progression has no prior data. Disc findings include neovascularization. Macula : microaneurysms. Vessels : Neovascularization.   Notes Preretinal hemorrhage boat shaped inferiorly OD with large neovascularization elsewhere nasally  OS with peripheral retinal neovascularization     Panretinal Photocoagulation - OD - Right Eye       Time Out Confirmed correct patient, procedure, site, and patient consented.   Anesthesia Topical anesthesia was used.   Laser Information The type of laser was diode. Color was yellow. The duration in seconds was 0.03. The spot size was 390 microns. Laser power was 340. Total spots was 1033.   Post-op The patient tolerated the procedure well. There were no complications. The patient received written and verbal post procedure care education.              ASSESSMENT/PLAN:  Posterior subcapsular age-related cataract, left eye Moderate likely to need surgical intervention in the near future  Posterior subcapsular polar age-related cataract of right eye Minor OD  Proliferative diabetic retinopathy of right eye  without macular edema associated with type 2 diabetes mellitus (HCC) OD with widespread peripheral neovascularization signifying extensive retinal nonperfusion.  With large frond nasal to nerve which could trigger the crunch syndrome should injection of antivegF be delivered.  For this reason we will deliver more permanent and yet slower acting peripheral PRP which will gently decrease vegF  burden and hopefully prevent the crunch syndrome developing as seen with antivegF delivery immediately.  Moreover I explained the patient that peripheral vitreous hemorrhage could proceed progress with PVD progression which is not from the laser but in fact and natural progression developing which could cause a decline in acuity OD temporarily.  Temporarily on the basis that this could also be surgically remedied easily.  The nature of proliferative diabetic retinopathy was discussed with the patient as well as the potential for a vitreous hemorrhage and traction on the retina. Tight control of glucose, blood pressure, and serum lipids was recommended in order to slow further progression of disease. Cessation of smoking was recommended. Treatment options including ocular injectable medications, panretinal photocoagulation and/or vitrectomy surgery for advanced disease were discussed. Local medical therapy may slow or arrest progression of disease.  Vitreous hemorrhage of right eye (HCC) Preretinal secondary to a very active PDR OD.  Proliferative diabetic retinopathy of left eye without macular edema associated with type 2 diabetes mellitus (HCC) Extensive peripheral nonperfusion and will need peripheral PRP in the very near future  OSA (obstructive sleep apnea) I encouraged the patient to commence with CPAP therapy as prompt as possible reviewing at length the benefits thereof to preventing nightly hypoxic damage and exacerbation of retinopathy from diabetic eye disease  Systemic benefits are well-known to him     ICD-10-CM   1. Proliferative diabetic retinopathy of right eye without macular edema associated with type 2 diabetes mellitus (HCC)  E11.3591 OCT, Retina - OU - Both Eyes    Color Fundus Photography Optos - OU - Both Eyes    Panretinal Photocoagulation - OD - Right Eye    2. Proliferative diabetic retinopathy of left eye without macular edema associated with type 2 diabetes mellitus  (HCC)  E11.3592 OCT, Retina - OU - Both Eyes    Color Fundus Photography Optos - OU - Both Eyes    3. Posterior subcapsular age-related cataract, left eye  H25.042     4. Posterior subcapsular polar age-related cataract of right eye  H25.041     5. Vitreous hemorrhage of right eye (HCC)  H43.11     6. OSA (obstructive sleep apnea)  G47.33       Very active looking diabetic retinopathy in each eye.  Explained to the patient that this will take approximately 1 year of periodic examinations and therapeutic intervention to bring his disease under control.  I encouraged him not to be discouraged should he have transient vision changes or even losses during this time.  2.  I explained that proliferative diabetic retinopathy will come under control initially with peripheral PRP needed by fluorescein angiography to be performed next week.  After injections may require to initiate complete PDR involution in a safe fashion but yet not to trigger the crunch syndrome of traction retinal detachment.  3.  I also explained the patient that countertraction intraocular lens placement may be required in the near future so as to 4 best acuity but also to  Ophthalmic Meds Ordered this visit:  No orders of the defined types were placed in this encounter.      Return  in about 1 week (around 07/06/2021) for DILATE OU, OPTOS FFA L/R, COLOR FP, PRP.  There are no Patient Instructions on file for this visit.   Explained the diagnoses, plan, and follow up with the patient and they expressed understanding.  Patient expressed understanding of the importance of proper follow up care.   Alford Highland Latiya Navia M.D. Diseases & Surgery of the Retina and Vitre[facility] 06/29/21     Abbreviations: M myopia (nearsighted); A astigmatism; H hyperopia (farsighted); P presbyopia; Mrx spectacle prescription;  CTL contact lenses; OD right eye; OS left eye; OU both eyes  XT exotropia; ET esotropia; PEK  punctate epithelial keratitis; PEE punctate epithelial erosions; DES dry eye syndrome; MGD meibomian gland dysfunction; ATs artificial tears; PFAT's preservative free artificial tears; NSC nuclear sclerotic cataract; PSC posterior subcapsular cataract; ERM epi-retinal membrane; PVD posterior vitreous detachment; RD retinal detachment; DM diabetes mellitus; DR diabetic retinopathy; NPDR non-proliferative diabetic retinopathy; PDR proliferative diabetic retinopathy; CSME clinically significant macular edema; DME diabetic macular edema; dbh dot blot hemorrhages; CWS cotton wool spot; POAG primary open angle glaucoma; C/D cup-to-disc ratio; HVF humphrey visual field; GVF goldmann visual field; OCT optical coherence tomography; IOP intraocular pressure; BRVO Branch retinal vein occlusion; CRVO central retinal vein occlusion; CRAO central retinal artery occlusion; BRAO branch retinal artery occlusion; RT retinal tear; SB scleral buckle; PPV pars plana vitrectomy; VH Vitreous hemorrhage; PRP panretinal laser photocoagulation; IVK intravitreal kenalog; VMT vitreomacular traction; MH Macular hole;  NVD neovascularization of the disc; NVE neovascularization elsewhere; AREDS age related eye disease study; ARMD age related macular degeneration; POAG primary open angle glaucoma; EBMD epithelial/anterior basement membrane dystrophy; ACIOL anterior chamber intraocular lens; IOL intraocular lens; PCIOL posterior chamber intraocular lens; Phaco/IOL phacoemulsification with intraocular lens placement; PRK photorefractive keratectomy; LASIK laser assisted in situ keratomileusis; HTN hypertension; DM diabetes mellitus; COPD chronic obstructive pulmonary disease

## 2021-07-04 ENCOUNTER — Encounter: Payer: Self-pay | Admitting: Internal Medicine

## 2021-07-04 ENCOUNTER — Ambulatory Visit (INDEPENDENT_AMBULATORY_CARE_PROVIDER_SITE_OTHER): Payer: 59 | Admitting: Internal Medicine

## 2021-07-04 ENCOUNTER — Other Ambulatory Visit: Payer: Self-pay

## 2021-07-04 VITALS — BP 150/90 | HR 60 | Resp 20 | Ht 73.0 in | Wt 327.0 lb

## 2021-07-04 DIAGNOSIS — G4733 Obstructive sleep apnea (adult) (pediatric): Secondary | ICD-10-CM | POA: Diagnosis not present

## 2021-07-04 DIAGNOSIS — Z794 Long term (current) use of insulin: Secondary | ICD-10-CM

## 2021-07-04 DIAGNOSIS — E119 Type 2 diabetes mellitus without complications: Secondary | ICD-10-CM

## 2021-07-04 DIAGNOSIS — E785 Hyperlipidemia, unspecified: Secondary | ICD-10-CM

## 2021-07-04 NOTE — Progress Notes (Signed)
LIPID CLINIC CONSULT NOTE  Chief Complaint:  Follow-up dyslipidemia  Primary Care Physician: Renford Dills, MD  Primary Cardiologist:  Nicki Guadalajara, MD  HPI:  [name] Merritt is a 50 y.o. male who is being seen today for the evaluation of dyslipidemia at the request of Renford Dills, MD.  [name] Merritt is seen today for management of dyslipidemia.  He has a history of coronary artery disease with acute coronary syndrome in August 2021 status post PCI.  He was also noted to have atrial flutter and underwent cardioversion for this.  Additional comorbidities include type 2 diabetes, marked dyslipidemia hypertension and morbid obesity.  He is recently been struggling with fatigue and is scheduled for a split-night study.  He is also had problems with plantar fasciitis and is limited his activity.  He was previously working a third shift job but had to stop that due to fatigue and shortness of breath.  He had recent lipids which showed total cholesterol 179, triglycerides 52, HDL 45 and LDL 124.  This demonstrates an increase in LDL from previous values in the 80s, probably related to decreased activity as he reports compliance with both high potency atorvastatin 80 mg and Zetia 10 mg daily.  He is working actively more at weight loss and dietary changes at this time but remains well above a target LDL less than 70.  Of note, [name] Merritt did agree to participation in the AEGIS-II trial for which she is now in the follow-up period.  07/04/2021  [name] Merritt is seen today in follow-up.  Overall he says he is doing fairly well on the Repatha.  He is tolerating it without any significant side effects.  Cholesterol has improved dramatically with total now 126, HDL 46, LDL 59 and triglycerides 116.  His only main complaint now is shortness of breath primarily when he started to fall asleep.  He was diagnosed by his cardiologist and sleep specialist Dr. Tresa Endo with severe obstructive sleep apnea.  He  was ordered for CPAP about 2 months ago but due to supply chain issues has not received his device.  After discussing this with Burna Mortimer, it seems that there will be several months more of wait until the supply chain can be improved.  PMHx:  Past Medical History:  Diagnosis Date   Coronary artery disease    Diabetes mellitus without complication (HCC)    High cholesterol    Hypertension    Myocardial infarction [facility])    New onset atrial flutter (HCC) 06/09/2020   Overweight     Past Surgical History:  Procedure Laterality Date   CARDIOVERSION N/A 06/10/2020   Procedure: CARDIOVERSION;  Surgeon: Little Ishikawa, MD;  Location: [facility] ENDOSCOPY;  Service: Cardiovascular;  Laterality: N/A;   CORONARY STENT INTERVENTION N/A 05/30/2020   Procedure: CORONARY STENT INTERVENTION;  Surgeon: Lennette Bihari, MD;  Location: [facility] INVASIVE CV LAB;  Service: Cardiovascular;  Laterality: N/A;   CORONARY/GRAFT ACUTE MI REVASCULARIZATION N/A 05/30/2020   Procedure: Coronary/Graft Acute MI Revascularization;  Surgeon: Lennette Bihari, MD;  Location: [facility] INVASIVE CV LAB;  Service: Cardiovascular;  Laterality: N/A;   LEFT HEART CATH AND CORONARY ANGIOGRAPHY N/A 05/30/2020   Procedure: LEFT HEART CATH AND CORONARY ANGIOGRAPHY;  Surgeon: Lennette Bihari, MD;  Location: [facility] INVASIVE CV LAB;  Service: Cardiovascular;  Laterality: N/A;   TEE WITHOUT CARDIOVERSION N/A 06/10/2020   Procedure: TRANSESOPHAGEAL ECHOCARDIOGRAM (TEE);  Surgeon: Little Ishikawa, MD;  Location: [facility] ENDOSCOPY;  Service: Cardiovascular;  Laterality: N/A;  torn rotator cuff  2018   2018    FAMHx:  Family History  Problem Relation Age of Onset   Hypertension Mother    Diabetes Mother    Hypertension Father    Diabetes Father    Diabetes Maternal Grandmother    Hypertension Maternal Grandmother    Diabetes Maternal Grandfather    Hypertension Maternal Grandfather    Diabetes Paternal Grandmother    Hypertension Paternal Grandmother     Diabetes Paternal Grandfather    Hypertension Paternal Grandfather    Asthma Neg Hx     SOCHx:   reports that he has never smoked. He has quit using smokeless tobacco.  His smokeless tobacco use included chew. He reports current alcohol use. He reports that he does not currently use drugs after having used the following drugs: Marijuana.  ALLERGIES:  Allergies  Allergen Reactions   Pineapple Itching and Other (See Comments)    All-over itching (mouth, too)   Tomato Itching and Other (See Comments)    All-over itching (mouth, too) Ketchup, also   Rosuvastatin Calcium Hives and Rash    ROS: Pertinent items noted in HPI and remainder of comprehensive ROS otherwise negative.  HOME MEDS: Current Outpatient Medications on File Prior to Visit  Medication Sig Dispense Refill   acetaminophen (TYLENOL) 650 MG CR tablet Take 1,300 mg by mouth every 8 (eight) hours as needed for pain.     albuterol (VENTOLIN HFA) 108 (90 Base) MCG/ACT inhaler Inhale 2 puffs into the lungs every 6 (six) hours as needed for wheezing or shortness of breath.     ALPRAZolam (XANAX) 0.25 MG tablet Take 0.25 mg by mouth daily as needed.     apixaban (ELIQUIS) 5 MG TABS tablet TAKE 1 TABLET BY MOUTH TWO TIMES DAILY 180 tablet 1   atorvastatin (LIPITOR) 80 MG tablet Take 1 tablet (80 mg total) by mouth daily at 6 PM. 90 tablet 3   cetirizine (ZYRTEC) 10 MG tablet TAKE 1 TABLET(10 MG) BY MOUTH DAILY 30 tablet 5   clopidogrel (PLAVIX) 75 MG tablet Take 1 tablet (75 mg total) by mouth daily. 90 tablet 2   dapagliflozin propanediol (FARXIGA) 5 MG TABS tablet Take 1 tablet (5 mg total) by mouth daily. 90 tablet 1   Evolocumab (REPATHA SURECLICK) 140 MG/ML SOAJ Inject 1 Dose into the skin every 14 (fourteen) days. 2 mL 11   fluticasone (FLONASE) 50 MCG/ACT nasal spray SHAKE LIQUID AND USE 1 SPRAY IN EACH NOSTRIL DAILY (Patient taking differently: Place 2 sprays into both nostrils daily as needed for allergies or rhinitis.)  16 g 2   Insulin Glargine (BASAGLAR KWIKPEN) 100 UNIT/ML INJECT 55 UNITS OR AS DIRECTED UNDER THE SKIN DAILY 45 mL 3   insulin glargine-yfgn (SEMGLEE, YFGN,) 100 UNIT/ML Pen Inject 55 Units into the skin daily or as directed. 45 mL 3   magnesium citrate SOLN Take 1 Bottle by mouth once as needed for mild constipation or moderate constipation.      metoprolol succinate (TOPROL-XL) 25 MG 24 hr tablet Take 1 tablet (25 mg total) by mouth daily. 90 tablet 2   nitroGLYCERIN (NITROSTAT) 0.4 MG SL tablet Place 1 tablet (0.4 mg total) under the tongue every 5 (five) minutes as needed for chest pain. 25 tablet 3   pantoprazole (PROTONIX) 40 MG tablet TAKE 1 TABLET (40 MG TOTAL) BY MOUTH 2 (TWO) TIMES DAILY. 60 tablet 5   sacubitril-valsartan (ENTRESTO) 97-103 MG Take 1 tablet by mouth 2 (two) times daily.  60 tablet 3   spironolactone (ALDACTONE) 25 MG tablet Take 0.5 tablets (12.5 mg total) by mouth daily. 60 tablet 1   terbinafine (LAMISIL) 250 MG tablet Take 1 tablet (250 mg total) by mouth daily. Repeat bloodwork after 30 days of treatment 90 tablet 0   No current facility-administered medications on file prior to visit.    LABS/IMAGING: No results found for this or any previous visit (from the past 48 hour(s)). No results found.  LIPID PANEL:    Component Value Date/Time   CHOL 179 01/28/2021 1000   TRIG 52 01/28/2021 1000   HDL 45 01/28/2021 1000   CHOLHDL 4.0 01/28/2021 1000   CHOLHDL 5.1 05/30/2020 0952   VLDL 15 05/30/2020 0952   LDLCALC 124 (H) 01/28/2021 1000    WEIGHTS: Wt Readings from Last 3 Encounters:  07/04/21 (!) 327 lb (148.3 kg)  06/07/21 (!) 315 lb (142.9 kg)  03/28/21 (!) 315 lb (142.9 kg)    VITALS: BP (!) 150/90 (BP Location: Left Arm, Patient Position: Sitting, Cuff Size: Normal)   Pulse 60   Resp 20   Ht 6\' 1"  (1.854 m)   Wt (!) 327 lb (148.3 kg)   SpO2 96%   BMI 43.14 kg/m   EXAM: Deferred  EKG: Deferred  ASSESSMENT: Mixed dyslipidemia, goal LDL  less than 70 Coronary artery disease status post PCI History of atrial flutter Morbid obesity Hypertension Family history of coronary disease  PLAN: 1.   Mr. Bartling seems to be doing well on Repatha.  He is also on high potency atorvastatin 80 mg daily.  His lipids have improved significantly and now is at target.  We will continue with this with repeat lipids in about a year and follow-up at that time.  He will need to continue to be proactive about trying to get his CPAP as he struggling with significant shortness of breath and falling asleep.  Barron Alvine, MD, [facility], FACP  [place]  [facility]  Medical Director of the Advanced Lipid Disorders &  Cardiovascular Risk Reduction Clinic Diplomate of the American Board of Clinical Lipidology Attending Cardiologist  Direct Dial: [phone]  Fax: [phone]  Website:  [email or web address] Elysse Polidore 07/04/2021, 8:30 AM

## 2021-07-04 NOTE — Patient Instructions (Signed)
Medication Instructions:  Your physician recommends that you continue on your current medications as directed. Please refer to the Current Medication list given to you today.  *If you need a refill on your cardiac medications before your next appointment, please call your pharmacy*   Lab Work: FASTING lab work to check cholesterol in 1 year -- complete before your next visit with Dr. Rennis Golden   If you have labs (blood work) drawn today and your tests are completely normal, you will receive your results only by: MyChart Message (if you have MyChart) OR A paper copy in the mail If you have any lab test that is abnormal or we need to change your treatment, we will call you to review the results.   Testing/Procedures: NONE   Follow-Up: At [facility], you and your health needs are our priority.  As part of our continuing mission to provide you with exceptional heart care, we have created designated Provider Care Teams.  These Care Teams include your primary Cardiologist (physician) and Advanced Practice Providers (APPs -  Physician Assistants and Nurse Practitioners) who all work together to provide you with the care you need, when you need it.  We recommend signing up for the patient portal called "MyChart".  Sign up information is provided on this After Visit Summary.  MyChart is used to connect with patients for Virtual Visits (Telemedicine).  Patients are able to view lab/test results, encounter notes, upcoming appointments, etc.  Non-urgent messages can be sent to your provider as well.   To learn more about what you can do with MyChart, go to [email or web address].    Your next appointment:   12 month(s) - lipid clinic  The format for your next appointment:   In Person  Provider:   K. Italy Hilty, MD   Other Instructions

## 2021-07-06 ENCOUNTER — Other Ambulatory Visit: Payer: Self-pay | Admitting: Cardiovascular Disease

## 2021-07-06 ENCOUNTER — Encounter (INDEPENDENT_AMBULATORY_CARE_PROVIDER_SITE_OTHER): Payer: Self-pay | Admitting: Ophthalmology

## 2021-07-06 ENCOUNTER — Other Ambulatory Visit: Payer: Self-pay

## 2021-07-06 ENCOUNTER — Ambulatory Visit (INDEPENDENT_AMBULATORY_CARE_PROVIDER_SITE_OTHER): Payer: 59 | Admitting: Ophthalmology

## 2021-07-06 DIAGNOSIS — G4733 Obstructive sleep apnea (adult) (pediatric): Secondary | ICD-10-CM | POA: Diagnosis not present

## 2021-07-06 DIAGNOSIS — H4311 Vitreous hemorrhage, right eye: Secondary | ICD-10-CM

## 2021-07-06 DIAGNOSIS — E113592 Type 2 diabetes mellitus with proliferative diabetic retinopathy without macular edema, left eye: Secondary | ICD-10-CM | POA: Diagnosis not present

## 2021-07-06 DIAGNOSIS — E113591 Type 2 diabetes mellitus with proliferative diabetic retinopathy without macular edema, right eye: Secondary | ICD-10-CM | POA: Diagnosis not present

## 2021-07-06 MED ORDER — BEVACIZUMAB 2.5 MG/0.1ML IZ SOSY
2.5000 mg | PREFILLED_SYRINGE | INTRAVITREAL | Status: AC | PRN
  Administered 2021-07-06: 2.5 mg via INTRAVITREAL

## 2021-07-06 MED FILL — Pantoprazole Sodium EC Tab 40 MG (Base Equiv): ORAL | 30 days supply | Qty: 60 | Fill #4 | Status: AC

## 2021-07-06 NOTE — Assessment & Plan Note (Signed)
I explained to the patient and the family they have critical importance of successful treatment of sleep apnea in order to prevent the nightly hypoxic stress to the retina ( as well as systemically and to the CNS system) to assist in the resolution of this advanced proliferative diabetic retinopathy in each eye and prevent macular hypoxia developing which can also threaten visual acuity.  More over the improved blood sugar control blood pressure control as well as significant improvement in sense of wellbeing and sleeping throughout the night, would occur with successful institution of CPAP.  This patient has overriding concerns systemically that require prompt institution of CPAP use if possible

## 2021-07-06 NOTE — Progress Notes (Signed)
07/06/2021     CHIEF COMPLAINT Patient presents for  Chief Complaint  Patient presents with   Retina Follow Up      HISTORY OF PRESENT ILLNESS: [name] Merritt is a 50 y.o. male who presents to the clinic today for:   HPI     Retina Follow Up   Patient presents with  Other.  In both eyes.  This started 1 week ago.  Severity is mild.  Duration of 1 week.  Since onset it is stable.        Comments   1 week fp/ffa l/r and prp os Pt states, "I am not going to consent to having any treatment on my left eye today because ever since I had laser last week on my right eye, I am not able to see a thing. My vision in my right eye is so blurred that I am not able to make out any details in anything that I am looking at." LBS: 140       Last edited by Demetrios Loll, COA on 07/06/2021  8:10 AM.      Referring physician: Renford Dills, MD [address] [place],  [place] [address]  HISTORICAL INFORMATION:   Selected notes from the MEDICAL RECORD NUMBER    Lab Results  Component Value Date   HGBA1C 6.7 (H) 01/28/2021     CURRENT MEDICATIONS: No current outpatient medications on file. (Ophthalmic Drugs)   No current facility-administered medications for this visit. (Ophthalmic Drugs)   Current Outpatient Medications (Other)  Medication Sig   acetaminophen (TYLENOL) 650 MG CR tablet Take 1,300 mg by mouth every 8 (eight) hours as needed for pain.   albuterol (VENTOLIN HFA) 108 (90 Base) MCG/ACT inhaler Inhale 2 puffs into the lungs every 6 (six) hours as needed for wheezing or shortness of breath.   ALPRAZolam (XANAX) 0.25 MG tablet Take 0.25 mg by mouth daily as needed.   apixaban (ELIQUIS) 5 MG TABS tablet TAKE 1 TABLET BY MOUTH TWO TIMES DAILY   atorvastatin (LIPITOR) 80 MG tablet Take 1 tablet (80 mg total) by mouth daily at 6 PM.   cetirizine (ZYRTEC) 10 MG tablet TAKE 1 TABLET(10 MG) BY MOUTH DAILY   clopidogrel (PLAVIX) 75 MG tablet Take 1 tablet (75  mg total) by mouth daily.   dapagliflozin propanediol (FARXIGA) 5 MG TABS tablet Take 1 tablet (5 mg total) by mouth daily.   Evolocumab (REPATHA SURECLICK) 140 MG/ML SOAJ Inject 1 Dose into the skin every 14 (fourteen) days.   fluticasone (FLONASE) 50 MCG/ACT nasal spray SHAKE LIQUID AND USE 1 SPRAY IN EACH NOSTRIL DAILY (Patient taking differently: Place 2 sprays into both nostrils daily as needed for allergies or rhinitis.)   Insulin Glargine (BASAGLAR KWIKPEN) 100 UNIT/ML INJECT 55 UNITS OR AS DIRECTED UNDER THE SKIN DAILY   insulin glargine-yfgn (SEMGLEE, YFGN,) 100 UNIT/ML Pen Inject 55 Units into the skin daily or as directed.   magnesium citrate SOLN Take 1 Bottle by mouth once as needed for mild constipation or moderate constipation.    metoprolol succinate (TOPROL-XL) 25 MG 24 hr tablet Take 1 tablet (25 mg total) by mouth daily.   nitroGLYCERIN (NITROSTAT) 0.4 MG SL tablet Place 1 tablet (0.4 mg total) under the tongue every 5 (five) minutes as needed for chest pain.   pantoprazole (PROTONIX) 40 MG tablet TAKE 1 TABLET (40 MG TOTAL) BY MOUTH 2 (TWO) TIMES DAILY.   sacubitril-valsartan (ENTRESTO) 97-103 MG Take 1 tablet  by mouth 2 (two) times daily.   spironolactone (ALDACTONE) 25 MG tablet Take 0.5 tablets (12.5 mg total) by mouth daily.   terbinafine (LAMISIL) 250 MG tablet Take 1 tablet (250 mg total) by mouth daily. Repeat bloodwork after 30 days of treatment   No current facility-administered medications for this visit. (Other)      REVIEW OF SYSTEMS:    ALLERGIES Allergies  Allergen Reactions   Pineapple Itching and Other (See Comments)    All-over itching (mouth, too)   Tomato Itching and Other (See Comments)    All-over itching (mouth, too) Ketchup, also   Rosuvastatin Calcium Hives and Rash    PAST MEDICAL HISTORY Past Medical History:  Diagnosis Date   Coronary artery disease    Diabetes mellitus without complication (HCC)    High cholesterol    Hypertension     Myocardial infarction [facility])    New onset atrial flutter (HCC) 06/09/2020   Overweight    Past Surgical History:  Procedure Laterality Date   CARDIOVERSION N/A 06/10/2020   Procedure: CARDIOVERSION;  Surgeon: Little Ishikawa, MD;  Location: [facility] ENDOSCOPY;  Service: Cardiovascular;  Laterality: N/A;   CORONARY STENT INTERVENTION N/A 05/30/2020   Procedure: CORONARY STENT INTERVENTION;  Surgeon: Lennette Bihari, MD;  Location: [facility] INVASIVE CV LAB;  Service: Cardiovascular;  Laterality: N/A;   CORONARY/GRAFT ACUTE MI REVASCULARIZATION N/A 05/30/2020   Procedure: Coronary/Graft Acute MI Revascularization;  Surgeon: Lennette Bihari, MD;  Location: [facility] INVASIVE CV LAB;  Service: Cardiovascular;  Laterality: N/A;   LEFT HEART CATH AND CORONARY ANGIOGRAPHY N/A 05/30/2020   Procedure: LEFT HEART CATH AND CORONARY ANGIOGRAPHY;  Surgeon: Lennette Bihari, MD;  Location: [facility] INVASIVE CV LAB;  Service: Cardiovascular;  Laterality: N/A;   TEE WITHOUT CARDIOVERSION N/A 06/10/2020   Procedure: TRANSESOPHAGEAL ECHOCARDIOGRAM (TEE);  Surgeon: Little Ishikawa, MD;  Location: [facility] ENDOSCOPY;  Service: Cardiovascular;  Laterality: N/A;   torn rotator cuff  2018   2018    FAMILY HISTORY Family History  Problem Relation Age of Onset   Hypertension Mother    Diabetes Mother    Hypertension Father    Diabetes Father    Diabetes Maternal Grandmother    Hypertension Maternal Grandmother    Diabetes Maternal Grandfather    Hypertension Maternal Grandfather    Diabetes Paternal Grandmother    Hypertension Paternal Grandmother    Diabetes Paternal Grandfather    Hypertension Paternal Grandfather    Asthma Neg Hx     SOCIAL HISTORY Social History   Tobacco Use   Smoking status: Never   Smokeless tobacco: Former    Types: Associate Professor Use: Never used  Substance Use Topics   Alcohol use: Yes    Comment: occasionally   Drug use: Not Currently    Types: Marijuana    Comment: quit  10-15 years ago         OPHTHALMIC EXAM:  Base Eye Exam     Visual Acuity (ETDRS)       Right Left   Dist [place] CF at 3' 20/50   Dist ph [place] NI 20/30 -2         Tonometry (Tonopen, 8:15 AM)       Right Left   Pressure 11 14         Pupils       Pupils Dark Light Shape React APD   Right PERRL 3 2 Round Brisk None   Left PERRL 3 2  Round Brisk None         Visual Fields (Counting fingers)       Left Right    Full Full         Extraocular Movement       Right Left    Full Full         Neuro/Psych     Oriented x3: Yes   Mood/Affect: Normal         Dilation     Both eyes: 1.0% Mydriacyl, 2.5% Phenylephrine @ 8:15 AM           Slit Lamp and Fundus Exam     External Exam       Right Left   External Normal Normal         Slit Lamp Exam       Right Left   Lids/Lashes Normal Normal   Conjunctiva/Sclera White and quiet White and quiet   Cornea Clear Clear   Anterior Chamber Deep and quiet Deep and quiet   Iris Round and reactive Round and reactive   Lens Clear Clear   Anterior Vitreous Normal Normal         Fundus Exam       Right Left   Posterior Vitreous Pre-retinal hemorrhage Normal   Disc Neovascularization Normal   C/D Ratio 0.2 0.2   Macula Normal Normal   Vessels PDR-active PDR-active, NVD superonasal and superotemporal   Periphery Normal, extensive retinal nonperfusion, large NVE at the equator nasally as well as in the macula posteriorly.  Fibrous tissue superonasal to the nerve Extensive retinal nonperfusion peripherally            IMAGING AND PROCEDURES  Imaging and Procedures for 07/06/21  Color Fundus Photography Optos - OU - Both Eyes       Right Eye Progression has no prior data. Disc findings include neovascularization. Macula : microaneurysms. Vessels : Neovascularization.   Left Eye Progression has no prior data. Disc findings include neovascularization. Macula : microaneurysms. Vessels :  Neovascularization.   Notes Preretinal hemorrhage boat shaped inferiorly OD with large neovascularization elsewhere nasally, slightly less active, OD, POST recent PRP, WITH DIFFUSE PRERETINAL HEMORRHAGE OD,,,    OS with peripheral retinal neovascularization, with no large fronds of NVE seen.  No NVD. Scattered exudates noted temporal to the macula which on OCT are not center involved     OCT, Retina - OU - Both Eyes       Right Eye Quality was good. Scan locations included subfoveal. Progression has no prior data.   Left Eye Quality was good. Scan locations included subfoveal. Central Foveal Thickness: 317. Progression has no prior data.   Notes Progression of PVD now with premacular preretinal hemorrhage, yet fovea faintly seen and viewed and no tractional change on the  Minor CSME temporally OS.        Intravitreal Injection, Pharmacologic Agent - OS - Left Eye       Time Out 07/06/2021. 9:12 AM. Confirmed correct patient, procedure, site, and patient consented.   Anesthesia Topical anesthesia was used. Anesthetic medications included Akten 3.5%.   Procedure Preparation included 5% betadine to ocular surface, 10% betadine to eyelids, Ofloxacin . A 30 gauge needle was used.   Injection: 2.5 mg bevacizumab 2.5 MG/0.1ML   Route: Intravitreal, Site: Left Eye   NDC: [phone], Lot: [id number]   Post-op Post injection exam found visual acuity of at least counting fingers. The patient tolerated the procedure well. There were no complications.  The patient received written and verbal post procedure care education. Post injection medications included ocuflox.              ASSESSMENT/PLAN:  Proliferative diabetic retinopathy of left eye without macular edema associated with type 2 diabetes mellitus (HCC) Examination and color fundus photography confirmed scattered peripheral small fronds of NVE.  Upon this I have recommended consideration of intravitreal injection of  Avastin so as to calm these areas down more quickly so as to prevent massive hemorrhage but more importantly prevent progression of fibrovascular disease.  I did explain to the patient and family that intravitreal hemorrhage can occur but it would be minor relative in comparison to the right eye due largely to the difference in the extent and the severity of the PDR noted in the right eye which had required and retinal photocoagulation instead of injection intravitreally so as to avoid rapid crunch syndrome retinal detachment.  Intravitreal injection of Avastin was offered today yet patient has elected due to concerns to wait for this procedure next week  Proliferative diabetic retinopathy of right eye without macular edema associated with type 2 diabetes mellitus (HCC) Good PRP visualized in the in the peripheral retina and slightly less active preretinal fibrosis superotemporal superonasal and nasal to the nerve.  Nonetheless very large fronds of NVE remain and NVD  Vitreous hemorrhage of right eye (HCC) As discussed with the patient last week progressive preretinal hemorrhage as developed as a PVD has progressed overlying the macular region allowing the hemorrhage to spread.  Nonetheless patient does have 20/20 through vision through this hemorrhage and OCT visualizes there is no traction retinal detachment of the macula.  We will need to complete PRP in the right eye in the near future  OSA (obstructive sleep apnea) I explained to the patient and the family they have critical importance of successful treatment of sleep apnea in order to prevent the nightly hypoxic stress to the retina ( as well as systemically and to the CNS system) to assist in the resolution of this advanced proliferative diabetic retinopathy in each eye and prevent macular hypoxia developing which can also threaten visual acuity.  More over the improved blood sugar control blood pressure control as well as significant  improvement in sense of wellbeing and sleeping throughout the night, would occur with successful institution of CPAP.  This patient has overriding concerns systemically that require prompt institution of CPAP use if possible     ICD-10-CM   1. Proliferative diabetic retinopathy of right eye without macular edema associated with type 2 diabetes mellitus (HCC)  S17.7939 Color Fundus Photography Optos - OU - Both Eyes    OCT, Retina - OU - Both Eyes    2. Proliferative diabetic retinopathy of left eye without macular edema associated with type 2 diabetes mellitus (HCC)  Q30.0923 Color Fundus Photography Optos - OU - Both Eyes    OCT, Retina - OU - Both Eyes    Intravitreal Injection, Pharmacologic Agent - OS - Left Eye    bevacizumab (AVASTIN) SOSY 2.5 mg    CANCELED: Fluorescein Angiography Optos (Transit OS)    3. Vitreous hemorrhage of right eye (HCC)  H43.11 Color Fundus Photography Optos - OU - Both Eyes    4. OSA (obstructive sleep apnea)  G47.33       1.  Dense vitreous hemorrhage of the right eye progressed thus limiting the value of fluorescein angiography.  2.  We will need to consider use of either PRP or  intravitreal Avastin OS to halt the earlier form of PDR in the left eye.  I have recommended intravitreal Avastin so as to stabilize the left eye long-term nonetheless knowing that there is a small chance of vitreous hemorrhage developing in the left eye in a similar fashion but not likely.  I explained to the patient that surgical undertaking, vitrectomy would restore vision in the left eye in such a case  3.  OD I explained the patient that healing process is more slow   #4.  I encouraged the patient and family to continue with all of their ongoing efforts to look your CPAP machinery due to supply chain issues from [facility] as well as their adaptive health supplier.  The wife clearly understands and will do so    Ophthalmic Meds Ordered this visit:  Meds ordered  this encounter  Medications   bevacizumab (AVASTIN) SOSY 2.5 mg       Return in about 1 week (around 07/13/2021) for dilate, OS, AVASTIN OCT.  There are no Patient Instructions on file for this visit.   Explained the diagnoses, plan, and follow up with the patient and they expressed understanding.  Patient expressed understanding of the importance of proper follow up care.   Alford Highland Daylon Lafavor M.D. Diseases & Surgery of the Retina and Vitreous [facility] 07/06/21     Abbreviations: M myopia (nearsighted); A astigmatism; H hyperopia (farsighted); P presbyopia; Mrx spectacle prescription;  CTL contact lenses; OD right eye; OS left eye; OU both eyes  XT exotropia; ET esotropia; PEK punctate epithelial keratitis; PEE punctate epithelial erosions; DES dry eye syndrome; MGD meibomian gland dysfunction; ATs artificial tears; PFAT's preservative free artificial tears; NSC nuclear sclerotic cataract; PSC posterior subcapsular cataract; ERM epi-retinal membrane; PVD posterior vitreous detachment; RD retinal detachment; DM diabetes mellitus; DR diabetic retinopathy; NPDR non-proliferative diabetic retinopathy; PDR proliferative diabetic retinopathy; CSME clinically significant macular edema; DME diabetic macular edema; dbh dot blot hemorrhages; CWS cotton wool spot; POAG primary open angle glaucoma; C/D cup-to-disc ratio; HVF humphrey visual field; GVF goldmann visual field; OCT optical coherence tomography; IOP intraocular pressure; BRVO Branch retinal vein occlusion; CRVO central retinal vein occlusion; CRAO central retinal artery occlusion; BRAO branch retinal artery occlusion; RT retinal tear; SB scleral buckle; PPV pars plana vitrectomy; VH Vitreous hemorrhage; PRP panretinal laser photocoagulation; IVK intravitreal kenalog; VMT vitreomacular traction; MH Macular hole;  NVD neovascularization of the disc; NVE neovascularization elsewhere; AREDS age related eye disease study; ARMD age  related macular degeneration; POAG primary open angle glaucoma; EBMD epithelial/anterior basement membrane dystrophy; ACIOL anterior chamber intraocular lens; IOL intraocular lens; PCIOL posterior chamber intraocular lens; Phaco/IOL phacoemulsification with intraocular lens placement; PRK photorefractive keratectomy; LASIK laser assisted in situ keratomileusis; HTN hypertension; DM diabetes mellitus; COPD chronic obstructive pulmonary disease

## 2021-07-06 NOTE — Assessment & Plan Note (Signed)
As discussed with the patient last week progressive preretinal hemorrhage as developed as a PVD has progressed overlying the macular region allowing the hemorrhage to spread.  Nonetheless patient does have 20/20 through vision through this hemorrhage and OCT visualizes there is no traction retinal detachment of the macula.  We will need to complete PRP in the right eye in the near future

## 2021-07-06 NOTE — Assessment & Plan Note (Signed)
Good PRP visualized in the in the peripheral retina and slightly less active preretinal fibrosis superotemporal superonasal and nasal to the nerve.  Nonetheless very large fronds of NVE remain and NVD

## 2021-07-06 NOTE — Assessment & Plan Note (Addendum)
Examination and color fundus photography confirmed scattered peripheral small fronds of NVE.  Upon this I have recommended consideration of intravitreal injection of Avastin so as to calm these areas down more quickly so as to prevent massive hemorrhage but more importantly prevent progression of fibrovascular disease.  I did explain to the patient and family that intravitreal hemorrhage can occur but it would be minor relative in comparison to the right eye due largely to the difference in the extent and the severity of the PDR noted in the right eye which had required and retinal photocoagulation instead of injection intravitreally so as to avoid rapid crunch syndrome retinal detachment.  Intravitreal injection of Avastin was offered today yet patient has elected due to concerns to wait for this procedure next week

## 2021-07-07 ENCOUNTER — Other Ambulatory Visit (HOSPITAL_COMMUNITY): Payer: Self-pay

## 2021-07-07 MED ORDER — ENTRESTO 97-103 MG PO TABS
1.0000 | ORAL_TABLET | Freq: Two times a day (BID) | ORAL | 3 refills | Status: DC
  Filled 2021-07-07: qty 60, 30d supply, fill #0
  Filled 2021-08-10: qty 60, 30d supply, fill #1
  Filled 2021-09-09 – 2021-09-12 (×3): qty 60, 30d supply, fill #2
  Filled 2021-09-12: qty 120, 60d supply, fill #2
  Filled 2021-10-12: qty 60, 30d supply, fill #3

## 2021-07-08 DIAGNOSIS — H4311 Vitreous hemorrhage, right eye: Secondary | ICD-10-CM | POA: Diagnosis not present

## 2021-07-08 DIAGNOSIS — H25813 Combined forms of age-related cataract, bilateral: Secondary | ICD-10-CM | POA: Diagnosis not present

## 2021-07-08 DIAGNOSIS — E1136 Type 2 diabetes mellitus with diabetic cataract: Secondary | ICD-10-CM | POA: Diagnosis not present

## 2021-07-08 DIAGNOSIS — E113553 Type 2 diabetes mellitus with stable proliferative diabetic retinopathy, bilateral: Secondary | ICD-10-CM | POA: Diagnosis not present

## 2021-07-11 ENCOUNTER — Telehealth (INDEPENDENT_AMBULATORY_CARE_PROVIDER_SITE_OTHER): Payer: Self-pay

## 2021-07-11 NOTE — Telephone Encounter (Signed)
See details on phone calls

## 2021-07-13 ENCOUNTER — Encounter (INDEPENDENT_AMBULATORY_CARE_PROVIDER_SITE_OTHER): Payer: 59 | Admitting: Ophthalmology

## 2021-07-14 ENCOUNTER — Other Ambulatory Visit (HOSPITAL_COMMUNITY): Payer: Self-pay

## 2021-07-15 DIAGNOSIS — E113512 Type 2 diabetes mellitus with proliferative diabetic retinopathy with macular edema, left eye: Secondary | ICD-10-CM | POA: Diagnosis not present

## 2021-07-15 DIAGNOSIS — H3582 Retinal ischemia: Secondary | ICD-10-CM | POA: Diagnosis not present

## 2021-07-15 DIAGNOSIS — E113521 Type 2 diabetes mellitus with proliferative diabetic retinopathy with traction retinal detachment involving the macula, right eye: Secondary | ICD-10-CM | POA: Diagnosis not present

## 2021-07-15 DIAGNOSIS — H53131 Sudden visual loss, right eye: Secondary | ICD-10-CM | POA: Diagnosis not present

## 2021-07-15 DIAGNOSIS — H35371 Puckering of macula, right eye: Secondary | ICD-10-CM | POA: Diagnosis not present

## 2021-07-15 DIAGNOSIS — H25813 Combined forms of age-related cataract, bilateral: Secondary | ICD-10-CM | POA: Diagnosis not present

## 2021-07-18 ENCOUNTER — Other Ambulatory Visit: Payer: Self-pay

## 2021-07-18 ENCOUNTER — Encounter (HOSPITAL_COMMUNITY): Payer: Self-pay | Admitting: Ophthalmology

## 2021-07-18 NOTE — Progress Notes (Addendum)
[name] denies chest pain or shortness of breath. Patient denies having any s/s of Covid in his household.  Patient denies any known exposure to Covid.  [name] has type II diabetes. Patient reports that CBGs run 120-140, he checks CBGs 2-3 times a day.  [name] reports that last A1C was 7.1, drawn a month ago at [facility] office.  PCP is Dr. Nehemiah Settle.I instructed [name] to not take Farxiga in am, patient had taken today's dose. I instructed patient to take 1/2 of Insulin glargine yfgn tonight - 27 units.  I instructed [name] to check CBG after awaking and every 2 hours until arrival  to the hospital. I Instructed [name]  if CBG is less than 70 to take 4 Glucose Tablets or 1 tube of Glucose Gel or 1/2 cup of a clear juice. Recheck CBG in 15 minutes if CBG is not over 70 call, pre- op desk at [phone] for further instructions. If scheduled to receive Insulin, do not take Insulin   [name] is taking Plavix and Eliquis, patient states that he was not given any instructions, but wonders if he should take it tomorrow am. [name] states that he had blood in his eye after an injection was given in his eye by another provider. Patient states that Dr. Allena Katz saw the blood in his eye and said it was because patient was taking Plavix and Eliquis. I told patient that I would call Dr. Eliane Decree office and ask and that if he needs to hold Plavix and Eliquis in am, someone would call him from the Dr.'s office if he needs to hold it.  I called Dr. Eliane Decree office and spoke to Dennie Bible, I asked that Dr. Allena Katz put orders in and asked if [name] should take Plavix and Eliquis in am.  Dennie Bible said she would call me back after speaking with Dr. Allena Katz.  I did not receive a call from Dr. Eliane Decree office. I called [name] after 1700 and asked if the office called him, patient said the office did call and instructed patient to not take Plavix or Eliquis in am.  I instructed patient to  shower with antibiotic soap, if it is available.  Dry off with a clean towel. Do not put lotion, powder, cologne or deodorant or makeup.No jewelry or piercings. Men may shave their face and neck. Woman should not shave. No nail polish, artificial or acrylic nails. Wear clean clothes, brush your teeth. Glasses, contact lens,dentures or partials may not be worn in the OR. If you need to wear them, please bring a case for glasses, do not wear contacts or bring a case, the hospital does not have contact cases, dentures or partials will have to be removed , make sure they are clean, we will provide a denture cup to put them in. You will need some one to drive you home and a responsible person over the age of 68 to stay with you for the first 24 hours after surgery.

## 2021-07-18 NOTE — Progress Notes (Signed)
Anesthesia Chart Review: SAME DAY WORK-UP  Case: [id number] Date/Time: 07/19/21 1400   Procedures:      REPAIR OF COMPLEX TRACTION RETINAL DETACHMENT (Right)     MEMBRANE PEEL (Right)     PHOTOCOAGULATION (Right)   Anesthesia type: Monitor Anesthesia Care   Pre-op diagnosis: TYPE TWO DIABETES WITH PROLIFERATIVE DIABETIC RETINOPATHY WITH TRACTION RETINOL DETACHMENT INVOLVING THE MACULA RIGHT EYE   [facility]: [facility] ROOM 08 / [facility]   Surgeons: Carmela Rima, MD       DISCUSSION: Patient is a 50 year old male scheduled for the above procedure.  History includes never smoker, DM2, HTN, hypercholesterolemia, CAD (anterior STEMI 05/30/20, s/p  DES LAD), ischemic cardiomyopathy (LVEF 35-40% post-STEMI 05/2020), aflutter (06/08/20, s/p DCCV 06/10/20), CHF, OSA (awaiting CPAP), GERD.   He is awaiting CPAP, ordered ~ 2 months ago but due to supply chain issues has not received his device yet.   He reported A1c of 7.1% last month at [facility].   Patient last seen by Dr. Tresa Endo on 01/28/21 and Dr. Caralyn Guile on 07/04/21. LVEF normalized on follow-up echo in May 2022. It seems he is on Eliquis for aflutter and Plavix for CAD. He has not been given any perioperative instructions about these medications from Dr. Allena Katz. PAT RN Jan has reached out to Dr. Eliane Decree staff.    He is a same-day work-up, so anesthesia team to evaluate on the day of surgery.    VS:  BP Readings from Last 3 Encounters:  07/04/21 (!) 150/90  03/08/21 (!) 172/111  03/02/21 (!) 162/84   Pulse Readings from Last 3 Encounters:  07/04/21 60  03/02/21 89  01/28/21 (!) 55     PROVIDERS: Renford Dills, MD is PCP  Nicki Guadalajara, MD is primary cardiologist. Last visit 01/28/21.  Entresto increased.  Spironolactone added.  Follow-up echo scheduled which showed normalization of LVEF in May. Chrystie Nose, MD is cardiologist ([facility]). Last visit 07/04/21.  He is participating in the AEGIS-II Trial at [facility] was doing fairly  well on Repatha. CPAP machine is still on backorder.    LABS: For day of surgery as indicated. As of 01/28/21, Cr 1.02, H/H 14.2/42.2, PLT 259, glucose 145, A1c 6.7%.   OTHER: Split Night CPAP Study 03/28/21: IMPRESSIONS - Severe obstructive sleep apnea occurred during the diagnostic portion of the study (AHI  35.5/h; RDI 38.7/h); events were very severe during REM sleep (AHI 91.4/h).  CPAP was initiated at 6 cm and was titrated to optimal PAP pressure (14 cm of water). AHI 0 at 14 cm without REM sleep, and 0.8/h at 12 cm with minimal REM sleep. Snoring persisted until 14 cm of water. - No significant central sleep apnea occurred during the diagnostic portion of the study (CAI 3.2/hour). - Mild oxygen desaturation was noted during the diagnostic portion of the study to a nadir of 79.0%. - The patient snored with loud snoring volume during the diagnostic portion of the study. - No cardiac abnormalities were noted during this study. - Clinically significant periodic limb movements did not occur during sleep. RECOMMENDATIONS - Recommend an initial trial of CPAP Auto therapy with EPR of 3 at 12 - 18 cm of water with heated humidification. A medium Wide Cushion size [employer] Minimal Catering manager Under Nose Frame (M) mask was used for the titration...  Home Sleep Test 04/28/20: Impressions: Moderate obstructive sleep apnea with an AHI 15.4, and SPO2 low of 74%.  He spent 54.4 minutes of test time with SPO2  less than 89%.  This can be associated with sleep-related hypoxia/hypoventilation.  Recommend lab CPAP titration study.  Spirometry 03/12/20: FVC 6.6 (139%), post 4.3 (91%). FEV1 5.5 (145%), post 3.1 (82%). FEV1/FVC 84% (104%), post 72% (90%).  Interpretation: Normal, but reported FEV1 and FVC should not be used for comparisons with previous or subsequent tests.   EKG: 01/28/10 ([facility]): Sinus bradycardia at 55 bpm Left axis deviation Inferior infarct (age  undetermined) Anterior infarct (age undetermined)   CV: Echo 03/08/21: IMPRESSIONS   1. Apical hypokinesis with overall preserved LV systolic function.   2. Left ventricular ejection fraction, by estimation, is 55 to 60%. The  left ventricle has normal function. The left ventricle has no regional  wall motion abnormalities. There is mild left ventricular hypertrophy.  Left ventricular diastolic parameters  were normal.   3. Right ventricular systolic function is normal. The right ventricular  size is normal. There is normal pulmonary artery systolic pressure.   4. The mitral valve is normal in structure. No evidence of mitral valve  regurgitation. No evidence of mitral stenosis.   5. The aortic valve is tricuspid. Aortic valve regurgitation is not  visualized. Mild aortic valve sclerosis is present, with no evidence of  aortic valve stenosis.   6. The inferior vena cava is normal in size with greater than 50%  respiratory variability, suggesting right atrial pressure of 3 mmHg.  - Comparison 06/10/20 TEE LVEF 40-45%; 05/31/20 TTE 35-40% post-anterior STEMI)   Cardiac cath/PCI 05/30/20: 3rd Mrg lesion is 50% stenosed. LPDA lesion is 70% stenosed. LPAV-1 lesion is 30% stenosed. LPAV-2 lesion is 30% stenosed. 1st Diag lesion is 70% stenosed. Ramus lesion is 20% stenosed. Mid LAD lesion is 100% stenosed. Post intervention, there is a 0% residual stenosis. A stent was successfully placed.   Acute anterolateral ST segment elevation myocardial infarction secondary to total occlusion of the mid LAD with TIMI 0 flow and absence of collaterals.   Concomitant CAD with tortuous 1st diagonal vessel with 70% narrowing beyond a sharp bend in the vessel, 20% narrowing beyond a sharp bend in the ramus immediate vessel, and dominant left circumflex coronary artery with 50% focal OM 3 stenosis, distal 30% stenoses in the AV groove, and focal 70% stenosis in the midportion of a small PLA branch.  Normal  small nondominant RCA.   LVEDP 12 millimeters Hg.    Successful PCI to the totally occluded mid LAD with ultimate insertion of a Resolute Onyx 3.0 x 22 mm DES stent postdilated to 3.25 mm with digital narrowing of 0% and restoration of TIMI-3 flow.   Past Medical History:  Diagnosis Date   CHF (congestive heart failure) (HCC)    Coronary artery disease    Diabetes mellitus without complication (HCC)    type II   GERD (gastroesophageal reflux disease)    High cholesterol    Hypertension    Myocardial infarction (HCC) 05/30/2020   New onset atrial flutter (HCC) 06/09/2020   Overweight    Sleep apnea     Past Surgical History:  Procedure Laterality Date   CARDIOVERSION N/A 06/10/2020   Procedure: CARDIOVERSION;  Surgeon: Little Ishikawa, MD;  Location: [facility] ENDOSCOPY;  Service: Cardiovascular;  Laterality: N/A;   CORONARY STENT INTERVENTION N/A 05/30/2020   Procedure: CORONARY STENT INTERVENTION;  Surgeon: Lennette Bihari, MD;  Location: [facility] INVASIVE CV LAB;  Service: Cardiovascular;  Laterality: N/A;   CORONARY/GRAFT ACUTE MI REVASCULARIZATION N/A 05/30/2020   Procedure: Coronary/Graft Acute MI Revascularization;  Surgeon: Tresa Endo,  Clovis Pu, MD;  Location: [facility] INVASIVE CV LAB;  Service: Cardiovascular;  Laterality: N/A;   LEFT HEART CATH AND CORONARY ANGIOGRAPHY N/A 05/30/2020   Procedure: LEFT HEART CATH AND CORONARY ANGIOGRAPHY;  Surgeon: Lennette Bihari, MD;  Location: [facility] INVASIVE CV LAB;  Service: Cardiovascular;  Laterality: N/A;   TEE WITHOUT CARDIOVERSION N/A 06/10/2020   Procedure: TRANSESOPHAGEAL ECHOCARDIOGRAM (TEE);  Surgeon: Little Ishikawa, MD;  Location: [facility] ENDOSCOPY;  Service: Cardiovascular;  Laterality: N/A;   torn rotator cuff  2018   2018    MEDICATIONS: No current facility-administered medications for this encounter.    acetaminophen (TYLENOL) 650 MG CR tablet   albuterol (VENTOLIN HFA) 108 (90 Base) MCG/ACT inhaler   apixaban (ELIQUIS) 5 MG TABS tablet    atorvastatin (LIPITOR) 80 MG tablet   cetirizine (ZYRTEC) 10 MG tablet   CINNAMON PO   clopidogrel (PLAVIX) 75 MG tablet   dapagliflozin propanediol (FARXIGA) 5 MG TABS tablet   Evolocumab (REPATHA SURECLICK) 140 MG/ML SOAJ   fluticasone (FLONASE) 50 MCG/ACT nasal spray   insulin glargine-yfgn (SEMGLEE, YFGN,) 100 UNIT/ML Pen   magnesium citrate SOLN   Melatonin 10 MG TABS   metoprolol succinate (TOPROL-XL) 25 MG 24 hr tablet   nitroGLYCERIN (NITROSTAT) 0.4 MG SL tablet   pantoprazole (PROTONIX) 40 MG tablet   Polyethyl Glycol-Propyl Glycol (SYSTANE ULTRA PF OP)   sacubitril-valsartan (ENTRESTO) 97-103 MG   spironolactone (ALDACTONE) 25 MG tablet   terbinafine (LAMISIL) 250 MG tablet   vitamin C (ASCORBIC ACID) 500 MG tablet   zinc gluconate 50 MG tablet   Insulin Glargine (BASAGLAR KWIKPEN) 100 UNIT/ML    Shonna Chock, PA-C Surgical Short Stay/Anesthesiology [facility] Phone [phone] [facility] Phone [phone] 07/18/2021 4:35 PM

## 2021-07-18 NOTE — Anesthesia Preprocedure Evaluation (Addendum)
Anesthesia Evaluation  Patient identified by MRN, date of birth, ID band Patient awake    Reviewed: Allergy & Precautions, H&P , NPO status , Patient's Chart, lab work & pertinent test results  Airway Mallampati: II   Neck ROM: full    Dental   Pulmonary sleep apnea ,    breath sounds clear to auscultation       Cardiovascular hypertension, + CAD, + Past MI, + Cardiac Stents and +CHF   Rhythm:regular Rate:Normal  TTE (02/2021): EF 55-60%   Neuro/Psych PSYCHIATRIC DISORDERS Anxiety    GI/Hepatic GERD  ,  Endo/Other  diabetes, Type obesity  Renal/GU      Musculoskeletal   Abdominal   Peds  Hematology   Anesthesia Other Findings   Reproductive/Obstetrics                            Anesthesia Physical Anesthesia Plan  ASA: 3  Anesthesia Plan: General   Post-op Pain Management:    Induction: Intravenous  PONV Risk Score and Plan: 2 and Ondansetron, Dexamethasone, Midazolam and Treatment may vary due to age or medical condition  Airway Management Planned: Oral ETT  Additional Equipment:   Intra-op Plan:   Post-operative Plan: Extubation in OR  Informed Consent: I have reviewed the patients History and Physical, chart, labs and discussed the procedure including the risks, benefits and alternatives for the proposed anesthesia with the patient or authorized representative who has indicated his/her understanding and acceptance.     Dental advisory given  Plan Discussed with: CRNA, Anesthesiologist and Surgeon  Anesthesia Plan Comments: (PAT note written 07/18/2021 by Shonna Chock, PA-C. )       Anesthesia Quick Evaluation

## 2021-07-19 ENCOUNTER — Encounter (HOSPITAL_COMMUNITY)
Admission: RE | Disposition: A | Discharge status: Home / Self Care | Payer: Self-pay | Source: Home / Self Care | Attending: Ophthalmology

## 2021-07-19 ENCOUNTER — Ambulatory Visit (HOSPITAL_COMMUNITY): Payer: 59 | Admitting: Vascular Surgery

## 2021-07-19 ENCOUNTER — Ambulatory Visit (HOSPITAL_COMMUNITY)
Admission: RE | Admit: 2021-07-19 | Discharge: 2021-07-19 | Disposition: A | Discharge status: Home / Self Care | Payer: 59 | Attending: Ophthalmology | Admitting: Ophthalmology

## 2021-07-19 ENCOUNTER — Other Ambulatory Visit: Payer: Self-pay

## 2021-07-19 ENCOUNTER — Encounter (HOSPITAL_COMMUNITY): Payer: Self-pay | Admitting: Ophthalmology

## 2021-07-19 DIAGNOSIS — Z6841 Body Mass Index (BMI) 40.0 and over, adult: Secondary | ICD-10-CM | POA: Diagnosis not present

## 2021-07-19 DIAGNOSIS — Z794 Long term (current) use of insulin: Secondary | ICD-10-CM | POA: Insufficient documentation

## 2021-07-19 DIAGNOSIS — I509 Heart failure, unspecified: Secondary | ICD-10-CM | POA: Insufficient documentation

## 2021-07-19 DIAGNOSIS — E7800 Pure hypercholesterolemia, unspecified: Secondary | ICD-10-CM | POA: Diagnosis not present

## 2021-07-19 DIAGNOSIS — I4892 Unspecified atrial flutter: Secondary | ICD-10-CM | POA: Diagnosis not present

## 2021-07-19 DIAGNOSIS — Z7902 Long term (current) use of antithrombotics/antiplatelets: Secondary | ICD-10-CM | POA: Diagnosis not present

## 2021-07-19 DIAGNOSIS — Z8249 Family history of ischemic heart disease and other diseases of the circulatory system: Secondary | ICD-10-CM | POA: Insufficient documentation

## 2021-07-19 DIAGNOSIS — Z91018 Allergy to other foods: Secondary | ICD-10-CM | POA: Diagnosis not present

## 2021-07-19 DIAGNOSIS — G4733 Obstructive sleep apnea (adult) (pediatric): Secondary | ICD-10-CM | POA: Diagnosis not present

## 2021-07-19 DIAGNOSIS — Z79899 Other long term (current) drug therapy: Secondary | ICD-10-CM | POA: Diagnosis not present

## 2021-07-19 DIAGNOSIS — Z87891 Personal history of nicotine dependence: Secondary | ICD-10-CM | POA: Insufficient documentation

## 2021-07-19 DIAGNOSIS — E113532 Type 2 diabetes mellitus with proliferative diabetic retinopathy with traction retinal detachment not involving the macula, left eye: Secondary | ICD-10-CM | POA: Diagnosis not present

## 2021-07-19 DIAGNOSIS — I252 Old myocardial infarction: Secondary | ICD-10-CM | POA: Insufficient documentation

## 2021-07-19 DIAGNOSIS — Z955 Presence of coronary angioplasty implant and graft: Secondary | ICD-10-CM | POA: Insufficient documentation

## 2021-07-19 DIAGNOSIS — Z888 Allergy status to other drugs, medicaments and biological substances status: Secondary | ICD-10-CM | POA: Insufficient documentation

## 2021-07-19 DIAGNOSIS — E113521 Type 2 diabetes mellitus with proliferative diabetic retinopathy with traction retinal detachment involving the macula, right eye: Secondary | ICD-10-CM | POA: Insufficient documentation

## 2021-07-19 DIAGNOSIS — Z7901 Long term (current) use of anticoagulants: Secondary | ICD-10-CM | POA: Diagnosis not present

## 2021-07-19 DIAGNOSIS — Z833 Family history of diabetes mellitus: Secondary | ICD-10-CM | POA: Diagnosis not present

## 2021-07-19 DIAGNOSIS — I2510 Atherosclerotic heart disease of native coronary artery without angina pectoris: Secondary | ICD-10-CM | POA: Diagnosis not present

## 2021-07-19 DIAGNOSIS — I110 Hypertensive heart disease with heart failure: Secondary | ICD-10-CM | POA: Diagnosis not present

## 2021-07-19 HISTORY — PX: GAS INSERTION: SHX5336

## 2021-07-19 HISTORY — DX: Gastro-esophageal reflux disease without esophagitis: K21.9

## 2021-07-19 HISTORY — DX: Heart failure, unspecified: I50.9

## 2021-07-19 HISTORY — DX: Sleep apnea, unspecified: G47.30

## 2021-07-19 HISTORY — PX: PHOTOCOAGULATION WITH LASER: SHX6027

## 2021-07-19 HISTORY — PX: MEMBRANE PEEL: SHX5967

## 2021-07-19 HISTORY — PX: GAS/FLUID EXCHANGE: SHX5334

## 2021-07-19 HISTORY — PX: REPAIR OF COMPLEX TRACTION RETINAL DETACHMENT: SHX6217

## 2021-07-19 LAB — BASIC METABOLIC PANEL
Anion gap: 8 (ref 5–15)
BUN: 18 mg/dL (ref 6–20)
CO2: 23 mmol/L (ref 22–32)
Calcium: 9 mg/dL (ref 8.9–10.3)
Chloride: 107 mmol/L (ref 98–111)
Creatinine, Ser: 0.95 mg/dL (ref 0.61–1.24)
GFR, Estimated: >60 mL/min (ref >60)
Glucose, Bld: 103 mg/dL — ABNORMAL HIGH (ref 70–99)
Potassium: 4.2 mmol/L (ref 3.5–5.1)
Sodium: 138 mmol/L (ref 135–145)

## 2021-07-19 LAB — CBC
HCT: 44.3 % (ref 39.0–52.0)
Hemoglobin: 14.2 g/dL (ref 13.0–17.0)
MCH: 31.1 pg (ref 26.0–34.0)
MCHC: 32.1 g/dL (ref 30.0–36.0)
MCV: 96.9 fL (ref 80.0–100.0)
Platelets: 248 10*3/uL (ref 150–400)
RBC: 4.57 MIL/uL (ref 4.22–5.81)
RDW: 12.4 % (ref 11.5–15.5)
WBC: 4.2 10*3/uL (ref 4.0–10.5)
nRBC: 0 % (ref 0.0–0.2)

## 2021-07-19 LAB — SURGICAL PCR SCREEN
MRSA, PCR: NEGATIVE
Staphylococcus aureus: NEGATIVE

## 2021-07-19 LAB — GLUCOSE, CAPILLARY
Glucose-Capillary: 104 mg/dL — ABNORMAL HIGH (ref 70–99)
Glucose-Capillary: 93 mg/dL (ref 70–99)
Glucose-Capillary: 79 mg/dL (ref 70–99)
Glucose-Capillary: 80 mg/dL (ref 70–99)
Glucose-Capillary: 99 mg/dL (ref 70–99)

## 2021-07-19 SURGERY — REPAIR, RETINAL DETACHMENT, COMPLEX
Anesthesia: General | Site: Eye | Laterality: Right

## 2021-07-19 MED ORDER — BSS PLUS IO SOLN
INTRAOCULAR | Status: AC
  Filled 2021-07-19: qty 500

## 2021-07-19 MED ORDER — ROCURONIUM BROMIDE 10 MG/ML (PF) SYRINGE
PREFILLED_SYRINGE | INTRAVENOUS | Status: AC
  Filled 2021-07-19: qty 10

## 2021-07-19 MED ORDER — ATROPINE SULFATE 1 % OP SOLN
OPHTHALMIC | Status: AC
  Filled 2021-07-19: qty 5

## 2021-07-19 MED ORDER — OXYCODONE HCL 5 MG/5ML PO SOLN
5.0000 mg | Freq: Once | ORAL | Status: DC | PRN
Start: 2021-07-19 — End: 2021-07-20

## 2021-07-19 MED ORDER — OFLOXACIN 0.3 % OP SOLN
1.0000 [drp] | OPHTHALMIC | Status: AC | PRN
  Administered 2021-07-19 (×3): 1 [drp] via OPHTHALMIC
  Filled 2021-07-19: qty 5

## 2021-07-19 MED ORDER — DEXAMETHASONE SODIUM PHOSPHATE 10 MG/ML IJ SOLN
INTRAMUSCULAR | Status: DC | PRN
  Administered 2021-07-19: 5 mg via INTRAVENOUS

## 2021-07-19 MED ORDER — PHENYLEPHRINE HCL 2.5 % OP SOLN
1.0000 [drp] | OPHTHALMIC | Status: AC | PRN
  Administered 2021-07-19 (×3): 1 [drp] via OPHTHALMIC
  Filled 2021-07-19: qty 2

## 2021-07-19 MED ORDER — SODIUM HYALURONATE 10 MG/ML IO SOLUTION
PREFILLED_SYRINGE | INTRAOCULAR | Status: AC
  Filled 2021-07-19: qty 0.85

## 2021-07-19 MED ORDER — FENTANYL CITRATE (PF) 250 MCG/5ML IJ SOLN
INTRAMUSCULAR | Status: DC | PRN
  Administered 2021-07-19 (×2): 50 ug via INTRAVENOUS

## 2021-07-19 MED ORDER — CHLORHEXIDINE GLUCONATE 0.12 % MT SOLN: 15.0000 mL | Freq: Once | OROMUCOSAL | Status: AC

## 2021-07-19 MED ORDER — SODIUM CHLORIDE 0.9 % IV SOLN: INTRAVENOUS | Status: DC

## 2021-07-19 MED ORDER — MIDAZOLAM HCL 2 MG/2ML IJ SOLN
INTRAMUSCULAR | Status: AC
  Filled 2021-07-19: qty 2

## 2021-07-19 MED ORDER — NA CHONDROIT SULF-NA HYALURON 40-30 MG/ML IO SOSY
INTRAOCULAR | Status: DC | PRN
Start: 2021-07-19 — End: 2021-07-19
  Administered 2021-07-19 (×2): 0.5 mL via INTRAOCULAR

## 2021-07-19 MED ORDER — LIDOCAINE HCL 2 % IJ SOLN
INTRAMUSCULAR | Status: AC
  Filled 2021-07-19: qty 20

## 2021-07-19 MED ORDER — BSS IO SOLN
INTRAOCULAR | Status: AC
  Filled 2021-07-19: qty 15

## 2021-07-19 MED ORDER — FENTANYL CITRATE (PF) 250 MCG/5ML IJ SOLN
INTRAMUSCULAR | Status: AC
  Filled 2021-07-19: qty 5

## 2021-07-19 MED ORDER — CHLORHEXIDINE GLUCONATE 0.12 % MT SOLN
OROMUCOSAL | Status: AC
  Administered 2021-07-19: 15 mL via OROMUCOSAL
  Filled 2021-07-19: qty 15

## 2021-07-19 MED ORDER — DEXAMETHASONE SODIUM PHOSPHATE 10 MG/ML IJ SOLN
INTRAMUSCULAR | Status: DC | PRN
  Administered 2021-07-19: 10 mg

## 2021-07-19 MED ORDER — PROPOFOL 10 MG/ML IV BOLUS
INTRAVENOUS | Status: DC | PRN
  Administered 2021-07-19: 140 mg via INTRAVENOUS
  Administered 2021-07-19: 10 mg via INTRAVENOUS

## 2021-07-19 MED ORDER — MIDAZOLAM HCL 5 MG/5ML IJ SOLN
INTRAMUSCULAR | Status: DC | PRN
  Administered 2021-07-19 (×2): 1 mg via INTRAVENOUS

## 2021-07-19 MED ORDER — NA CHONDROIT SULF-NA HYALURON 40-30 MG/ML IO SOSY
INTRAOCULAR | Status: AC
  Filled 2021-07-19: qty 0.5

## 2021-07-19 MED ORDER — LACTATED RINGERS IV SOLN
INTRAVENOUS | Status: DC | PRN
Start: 2021-07-19 — End: 2021-07-19

## 2021-07-19 MED ORDER — OXYCODONE HCL 5 MG PO TABS: 5.0000 mg | ORAL_TABLET | Freq: Once | ORAL | Status: DC | PRN

## 2021-07-19 MED ORDER — LIDOCAINE HCL 2 % IJ SOLN
INTRAMUSCULAR | Status: DC | PRN
  Administered 2021-07-19: 1 mL via RETROBULBAR

## 2021-07-19 MED ORDER — ROCURONIUM BROMIDE 10 MG/ML (PF) SYRINGE
PREFILLED_SYRINGE | INTRAVENOUS | Status: DC | PRN
  Administered 2021-07-19 (×2): 30 mg via INTRAVENOUS
  Administered 2021-07-19: 70 mg via INTRAVENOUS

## 2021-07-19 MED ORDER — CEFAZOLIN SUBCONJUNCTIVAL INJECTION 100 MG/0.5 ML
INJECTION | SUBCONJUNCTIVAL | Status: DC | PRN
  Administered 2021-07-19: 100 mg via SUBCONJUNCTIVAL

## 2021-07-19 MED ORDER — SUGAMMADEX SODIUM 500 MG/5ML IV SOLN
INTRAVENOUS | Status: DC | PRN
  Administered 2021-07-19: 400 mg via INTRAVENOUS
  Administered 2021-07-19: 100 mg via INTRAVENOUS

## 2021-07-19 MED ORDER — PROPARACAINE HCL 0.5 % OP SOLN
1.0000 [drp] | OPHTHALMIC | Status: AC | PRN
  Administered 2021-07-19 (×3): 1 [drp] via OPHTHALMIC
  Filled 2021-07-19: qty 15

## 2021-07-19 MED ORDER — EPINEPHRINE PF 1 MG/ML IJ SOLN
INTRAOCULAR | Status: DC | PRN
  Administered 2021-07-19: 500.3 mL

## 2021-07-19 MED ORDER — DEXAMETHASONE SODIUM PHOSPHATE 10 MG/ML IJ SOLN
INTRAMUSCULAR | Status: AC
  Filled 2021-07-19: qty 1

## 2021-07-19 MED ORDER — TROPICAMIDE 1 % OP SOLN
1.0000 [drp] | OPHTHALMIC | Status: AC | PRN
  Administered 2021-07-19 (×3): 1 [drp] via OPHTHALMIC
  Filled 2021-07-19: qty 15

## 2021-07-19 MED ORDER — CYCLOPENTOLATE HCL 1 % OP SOLN
1.0000 [drp] | OPHTHALMIC | Status: AC | PRN
  Administered 2021-07-19 (×3): 1 [drp] via OPHTHALMIC
  Filled 2021-07-19: qty 2

## 2021-07-19 MED ORDER — ONDANSETRON HCL 4 MG/2ML IJ SOLN
4.0000 mg | Freq: Four times a day (QID) | INTRAMUSCULAR | Status: DC | PRN
Start: 2021-07-19 — End: 2021-07-20

## 2021-07-19 MED ORDER — BUPIVACAINE HCL (PF) 0.75 % IJ SOLN
INTRAMUSCULAR | Status: AC
  Filled 2021-07-19: qty 10

## 2021-07-19 MED ORDER — EPINEPHRINE PF 1 MG/ML IJ SOLN
INTRAMUSCULAR | Status: AC
  Filled 2021-07-19: qty 1

## 2021-07-19 MED ORDER — SUGAMMADEX SODIUM 500 MG/5ML IV SOLN
INTRAVENOUS | Status: AC
  Filled 2021-07-19: qty 5

## 2021-07-19 MED ORDER — CEFAZOLIN SUBCONJUNCTIVAL INJECTION 100 MG/0.5 ML
100.0000 mg | INJECTION | SUBCONJUNCTIVAL | Status: DC
  Filled 2021-07-19: qty 5

## 2021-07-19 MED ORDER — TOBRAMYCIN-DEXAMETHASONE 0.3-0.1 % OP OINT
TOPICAL_OINTMENT | OPHTHALMIC | Status: DC | PRN
  Administered 2021-07-19: 1 via OPHTHALMIC

## 2021-07-19 MED ORDER — TOBRAMYCIN-DEXAMETHASONE 0.3-0.1 % OP OINT
TOPICAL_OINTMENT | OPHTHALMIC | Status: AC
  Filled 2021-07-19: qty 3.5

## 2021-07-19 MED ORDER — PHENYLEPHRINE HCL-NACL 20-0.9 MG/250ML-% IV SOLN
INTRAVENOUS | Status: DC | PRN
  Administered 2021-07-19: 50 ug/min via INTRAVENOUS

## 2021-07-19 MED ORDER — LIDOCAINE 2% (20 MG/ML) 5 ML SYRINGE
INTRAMUSCULAR | Status: DC | PRN
  Administered 2021-07-19: 80 mg via INTRAVENOUS

## 2021-07-19 MED ORDER — TRIAMCINOLONE ACETONIDE 40 MG/ML IJ SUSP
INTRAMUSCULAR | Status: AC
  Filled 2021-07-19: qty 5

## 2021-07-19 MED ORDER — INDOCYANINE GREEN 25 MG IV SOLR
INTRAVENOUS | Status: AC
  Filled 2021-07-19: qty 10

## 2021-07-19 MED ORDER — FENTANYL CITRATE (PF) 100 MCG/2ML IJ SOLN
25.0000 ug | INTRAMUSCULAR | Status: DC | PRN
  Administered 2021-07-19: 25 ug via INTRAVENOUS

## 2021-07-19 MED ORDER — ONDANSETRON HCL 4 MG/2ML IJ SOLN
INTRAMUSCULAR | Status: DC | PRN
  Administered 2021-07-19: 4 mg via INTRAVENOUS

## 2021-07-19 MED ORDER — DEXMEDETOMIDINE (PRECEDEX) IN NS 20 MCG/5ML (4 MCG/ML) IV SYRINGE
PREFILLED_SYRINGE | INTRAVENOUS | Status: DC | PRN
  Administered 2021-07-19: 8 ug via INTRAVENOUS
  Administered 2021-07-19: 12 ug via INTRAVENOUS

## 2021-07-19 MED ORDER — HYALURONIDASE HUMAN 150 UNIT/ML IJ SOLN
INTRAMUSCULAR | Status: AC
  Filled 2021-07-19: qty 1

## 2021-07-19 MED ORDER — ORAL CARE MOUTH RINSE: 15.0000 mL | Freq: Once | OROMUCOSAL | Status: AC

## 2021-07-19 MED ORDER — FENTANYL CITRATE (PF) 100 MCG/2ML IJ SOLN
INTRAMUSCULAR | Status: AC
  Filled 2021-07-19: qty 2

## 2021-07-19 SURGICAL SUPPLY — 65 items
APPLICATOR COTTON TIP 6 STRL (MISCELLANEOUS) ×2 IMPLANT
APPLICATOR COTTON TIP 6IN STRL (MISCELLANEOUS) ×3
BAND WRIST GAS GREEN (MISCELLANEOUS) IMPLANT
BLADE MVR KNIFE 20G (BLADE) IMPLANT
BLADE STAB KNIFE 15DEG (BLADE) IMPLANT
BNDG EYE OVAL (GAUZE/BANDAGES/DRESSINGS) ×3 IMPLANT
CANNULA ANT CHAM MAIN (OPHTHALMIC RELATED) IMPLANT
CANNULA DUAL BORE 23G (CANNULA) IMPLANT
CANNULA DUALBORE 25G (CANNULA) IMPLANT
CANNULA VLV SOFT TIP 25GA (OPHTHALMIC) ×3 IMPLANT
CAUTERY EYE LOW TEMP 1300F FIN (OPHTHALMIC RELATED) IMPLANT
CHANDELIER CONSTEL 25G RFID (OPHTHALMIC) ×3 IMPLANT
CLSR STERI-STRIP ANTIMIC 1/2X4 (GAUZE/BANDAGES/DRESSINGS) ×3 IMPLANT
COVER MAYO STAND STRL (DRAPES) IMPLANT
DRAPE HALF SHEET 40X57 (DRAPES) ×3 IMPLANT
DRAPE INCISE 51X51 W/FILM STRL (DRAPES) IMPLANT
DRAPE RETRACTOR (MISCELLANEOUS) ×3 IMPLANT
ERASER HMR WETFIELD 23G BP (MISCELLANEOUS) IMPLANT
FORCEPS ECKARDT ILM 25G SERR (OPHTHALMIC RELATED) IMPLANT
FORCEPS GRIESHABER 23G SERR (INSTRUMENTS) ×3 IMPLANT
FORCEPS GRIESHABER ILM 25G A (INSTRUMENTS) IMPLANT
FORCEPS GRIESHABER MAX 25G (MISCELLANEOUS) ×3 IMPLANT
GAS AUTO FILL CONSTEL (OPHTHALMIC) ×3
GAS AUTO FILL CONSTELLATION (OPHTHALMIC) ×2 IMPLANT
GAS WRIST BAND GREEN (MISCELLANEOUS)
GLOVE SURG SYN 7.5  E (GLOVE) ×3
GLOVE SURG SYN 7.5 E (GLOVE) ×2 IMPLANT
GOWN STRL REUS W/ TWL LRG LVL3 (GOWN DISPOSABLE) ×2 IMPLANT
GOWN STRL REUS W/TWL LRG LVL3 (GOWN DISPOSABLE) ×3
KIT BASIN OR (CUSTOM PROCEDURE TRAY) ×3 IMPLANT
KIT TURNOVER KIT B (KITS) ×3 IMPLANT
LENS BIOM SUPER VIEW SET DISP (MISCELLANEOUS) ×3 IMPLANT
MICROPICK 25G (MISCELLANEOUS)
NEEDLE 18GX1X1/2 (RX/OR ONLY) (NEEDLE) ×3 IMPLANT
NEEDLE 25GX 5/8IN NON SAFETY (NEEDLE) ×3 IMPLANT
NEEDLE 27GX1/2 REG BEVEL ECLIP (NEEDLE) ×3 IMPLANT
NEEDLE FILTER BLUNT 18X 1/2SAF (NEEDLE) ×1
NEEDLE FILTER BLUNT 18X1 1/2 (NEEDLE) ×2 IMPLANT
NEEDLE HYPO 25GX1X1/2 BEV (NEEDLE) IMPLANT
NEEDLE HYPO 30X.5 LL (NEEDLE) ×6 IMPLANT
NEEDLE RETROBULBAR 25GX1.5 (NEEDLE) ×3 IMPLANT
NS IRRIG 1000ML POUR BTL (IV SOLUTION) ×3 IMPLANT
PACK FRAGMATOME (OPHTHALMIC) IMPLANT
PACK VITRECTOMY CUSTOM (CUSTOM PROCEDURE TRAY) ×3 IMPLANT
PAD ARMBOARD 7.5X6 YLW CONV (MISCELLANEOUS) ×6 IMPLANT
PAK PIK VITRECTOMY CVS 25GA (OPHTHALMIC) ×3 IMPLANT
PIC ILLUMINATED 25G (OPHTHALMIC) ×3
PICK MICROPICK 25G (MISCELLANEOUS) IMPLANT
PIK ILLUMINATED 25G (OPHTHALMIC) ×2 IMPLANT
PROBE ENDO DIATHERMY 25G (MISCELLANEOUS) ×3 IMPLANT
PROBE LASER ILLUM FLEX CVD 25G (OPHTHALMIC) ×3 IMPLANT
ROLLS DENTAL (MISCELLANEOUS) IMPLANT
SCRAPER DIAMOND 25GA (OPHTHALMIC RELATED) IMPLANT
SHIELD EYE LENSE ONLY DISP (GAUZE/BANDAGES/DRESSINGS) ×3 IMPLANT
SOL ANTI FOG 6CC (MISCELLANEOUS) ×2 IMPLANT
SOLUTION ANTI FOG 6CC (MISCELLANEOUS) ×1
STOPCOCK 4 WAY LG BORE MALE ST (IV SETS) IMPLANT
SUT VICRYL 7 0 TG140 8 (SUTURE) IMPLANT
SUT VICRYL 8 0 TG140 8 (SUTURE) ×3 IMPLANT
SYR 10ML LL (SYRINGE) IMPLANT
SYR 20ML LL LF (SYRINGE) ×3 IMPLANT
SYR 5ML LL (SYRINGE) ×3 IMPLANT
SYR TB 1ML LUER SLIP (SYRINGE) IMPLANT
WATER STERILE IRR 1000ML POUR (IV SOLUTION) ×3 IMPLANT
WIPE INSTRUMENT VISIWIPE 73X73 (MISCELLANEOUS) IMPLANT

## 2021-07-19 NOTE — Discharge Instructions (Addendum)
DO NOT SLEEP ON BACK, THE EYE PRESSURE CAN GO UP AND CAUSE VISION LOSS   SLEEP ON SIDE WITH NOSE TO PILLOW  DURING DAY KEEP FACE DOWN.  15 MINUTES EVERY 2 HOURS IT IS OK TO LOOK STRAIGHT AHEAD (USE BATHROOM, EAT, WALK, ETC.)  

## 2021-07-19 NOTE — Transfer of Care (Signed)
Immediate Anesthesia Transfer of Care Note  Patient: [name] Merritt  Procedure(s) Performed: REPAIR OF COMPLEX TRACTION RETINAL DETACHMENT (Right: Eye) MEMBRANE PEEL (Right: Eye) PHOTOCOAGULATION WITH LASER (Right: Eye) INSERTION OF GAS - C3F8 (Right: Eye) GAS/FLUID EXCHANGE (Right: Eye)  Patient Location: PACU  Anesthesia Type:General  Level of Consciousness: awake, alert  and patient cooperative  Airway & Oxygen Therapy: Patient Spontanous Breathing and Patient connected to nasal cannula oxygen  Post-op Assessment: Report given to RN, Post -op Vital signs reviewed and stable and Patient moving all extremities X 4  Post vital signs: Reviewed and stable  Last Vitals:  Vitals Value Taken Time  BP    Temp    Pulse    Resp    SpO2 98     Last Pain:  Vitals:   07/19/21 1212  TempSrc:   PainSc: 0-No pain         Complications: No notable events documented.

## 2021-07-19 NOTE — Anesthesia Procedure Notes (Signed)
Procedure Name: Intubation Date/Time: 07/19/2021 3:49 PM Performed by: Janene Harvey, CRNA Pre-anesthesia Checklist: Patient identified, Emergency Drugs available, Suction available and Patient being monitored Patient Re-evaluated:Patient Re-evaluated prior to induction Oxygen Delivery Method: Circle system utilized Preoxygenation: Pre-oxygenation with 100% oxygen Induction Type: IV induction Ventilation: Mask ventilation without difficulty and Oral airway inserted - appropriate to patient size Laryngoscope Size: Mac and 4 Grade View: Grade IV Tube type: Oral Tube size: 7.5 mm Number of attempts: 1 Airway Equipment and Method: Stylet and Oral airway Placement Confirmation: ETT inserted through vocal cords under direct vision, positive ETCO2 and breath sounds checked- equal and bilateral Secured at: 23 cm Tube secured with: Tape Dental Injury: Teeth and Oropharynx as per pre-operative assessment  Difficulty Due To: Difficulty was anticipated and Difficult Airway- due to large tongue Comments: Easy mask with oral airway. DL by CRNA with MAC 4 grade IV view, DL by MDA grade IV view, ett placed by MDA. Recommend glidescope for future intubations

## 2021-07-19 NOTE — Op Note (Signed)
[name] Merritt 07/19/2021 Diagnosis: Proliferative diabetic retinopathy with tractional retinal detachment involving the macula OD  Procedure: Pars Plana Vitrectomy, Membrane Peeling, Endolaser, Fluid Gas Exchange, and membranectomy and C3F8 gas tamponade Operative Eye:  right eye  Surgeon: Harrold Donath Estimated Blood Loss: minimal Specimens for Pathology:  None Complications: none      After informed consent was obtained, the patient was brought to the operating room and a time-out confirmed the correct operative eye as the left eye. Retrobulbar anesthesia was obtained in the left eye without complication  The  patient was prepped and draped in the usual fashion for ocular surgery on the  right eye .  A lid speculum was placed.  Infusion line and trocar was placed at the 4 o'clock position approximately 3.5 mm from the surgical limbus.   The infusion line was allowed to run and then clamped when placed at the cannula opening. The line was inserted and secured to the drape with an adhesive strip.   Active trocars/cannula were placed at the 10 and 2 o'clock positions approximately 3.5 mm from the surgical limbus. The cannula was visualized in the vitreous cavity.  The light pipe and vitreous cutter were inserted into the vitreous cavity and a core vitrectomy was performed.  Care taken to remove the vitreous up to the vitreous base for 360 degrees.   Attention was directed toward relieving the tractional detachment from the posterior pole in particular around the arcades and at the disc.  This was done carefully at the disc and surrounding arcades. There was notable neovascular fronds with traction and associated hemorrhage superiorly, inferiorly, and nasally. Care was taken to elevate the membranes and remove them both with a vitrector and a light pipe with dissection.  A lighted pick was used to carefully separate the neovascular fronds from the overlying gliotic and fibrotic tissue.   Hemostasis of the neovascular fronds was performed with endolaser. Continued dissection of membranes and removal of membranes was performed including the nasal, inferior, and superior retina.  Temporal tractional detachment was also relieved with the lighted pick.  Endolaser was applied to the areas where the neovascular fronds created retinal elevation.  3 rows of endolaser were applied 360 degrees to the periphery.  A complete air-fluid exchange was then performed and additional endolaser was applied. 12% C3F8 gas was injected into the eye. The trocars were sequentially removed and all were noted to be airtight except the inferior sclerotomy which was closed with 8-0 Vicryl.  The overlying conjunctiva was similarly closed.  Subconjunctival injections of Ancef and Decadron were placed.   The speculum and drapes were removed and the eye was patched with Polymixin/Bacitracin ophthalmic ointment. An eye shield was placed and the patient was transferred alert and conversant with stable vital signs to the post operative recovery area.  The patient tolerated the procedure well and no complications were noted.    Harrold Donath MD

## 2021-07-19 NOTE — H&P (Signed)
Date of examination:  07/19/21  Indication for surgery: tractional retinal detachment right eye  Pertinent past medical history:  Past Medical History:  Diagnosis Date   CHF (congestive heart failure) (HCC)    Coronary artery disease    Diabetes mellitus without complication (HCC)    type II   GERD (gastroesophageal reflux disease)    High cholesterol    Hypertension    Myocardial infarction (HCC) 05/30/2020   New onset atrial flutter (HCC) 06/09/2020   Overweight    Sleep apnea     Pertinent ocular history:  proliferative diabetic retinopathy  Pertinent family history:  Family History  Problem Relation Age of Onset   Hypertension Mother    Diabetes Mother    Hypertension Father    Diabetes Father    Diabetes Maternal Grandmother    Hypertension Maternal Grandmother    Diabetes Maternal Grandfather    Hypertension Maternal Grandfather    Diabetes Paternal Grandmother    Hypertension Paternal Grandmother    Diabetes Paternal Grandfather    Hypertension Paternal Grandfather    Asthma Neg Hx     General:  Healthy appearing patient in no distress.    Eyes:    Acuity OD 20/70    External: Within normal limits      Anterior segment: Within normal limits       Fundus: tractional retinal detachment right eye   Impression: tractional retinal detachment right eye  Plan:  tractional retinal detachment repair right eye  Carmela Rima, MD

## 2021-07-19 NOTE — Brief Op Note (Signed)
07/19/2021  5:50 PM  PATIENT:  [name] Merritt  50 y.o. male  PRE-OPERATIVE DIAGNOSIS:  TYPE TWO DIABETES WITH PROLIFERATIVE DIABETIC RETINOPATHY WITH TRACTION RETINOL DETACHMENT INVOLVING THE MACULA RIGHT EYE  POST-OPERATIVE DIAGNOSIS:  TYPE TWO DIABETES WITH PROLIFERATIVE DIABETIC RETINOPATHY WITH TRACTION RETINOL DETACHMENT INVOLVING THE MACULA RIGHT EYE  PROCEDURE:  Procedure(s): REPAIR OF COMPLEX TRACTION RETINAL DETACHMENT (Right) MEMBRANE PEEL/ MEMBRANECTOMY (Right) PHOTOCOAGULATION WITH LASER (Right) INSERTION OF GAS - C3F8 (Right) GAS/FLUID EXCHANGE (Right)  SURGEON:  Surgeon(s) and Role:    * Carmela Rima, MD - Primary  PHYSICIAN ASSISTANT:   ASSISTANTS: none   ANESTHESIA:   local and general  EBL:  MINIMAL   BLOOD ADMINISTERED:none  DRAINS: none   LOCAL MEDICATIONS USED:  MARCAINE    and LIDOCAINE   SPECIMEN:  No Specimen  DISPOSITION OF SPECIMEN:  N/A  COUNTS:  YES  TOURNIQUET:  * No tourniquets in log *  DICTATION: .Note written in EPIC  PLAN OF CARE: Discharge to home after PACU  PATIENT DISPOSITION:  PACU - hemodynamically stable.   Delay start of Pharmacological VTE agent (>24hrs) due to surgical blood loss or risk of bleeding: not applicable

## 2021-07-20 ENCOUNTER — Other Ambulatory Visit (HOSPITAL_COMMUNITY): Payer: Self-pay

## 2021-07-20 ENCOUNTER — Encounter (HOSPITAL_COMMUNITY): Payer: Self-pay | Admitting: Ophthalmology

## 2021-07-20 DIAGNOSIS — E113512 Type 2 diabetes mellitus with proliferative diabetic retinopathy with macular edema, left eye: Secondary | ICD-10-CM | POA: Diagnosis not present

## 2021-07-20 MED ORDER — OFLOXACIN 0.3 % OP SOLN
1.0000 [drp] | Freq: Four times a day (QID) | OPHTHALMIC | 0 refills | Status: AC
  Filled 2021-07-20: qty 5, 25d supply, fill #0

## 2021-07-20 MED ORDER — PREDNISOLONE ACETATE 1 % OP SUSP
OPHTHALMIC | 0 refills | Status: AC
  Filled 2021-07-20: qty 10, 28d supply, fill #0

## 2021-07-21 NOTE — Anesthesia Postprocedure Evaluation (Signed)
Anesthesia Post Note  Patient: [name] Merritt  Procedure(s) Performed: REPAIR OF COMPLEX TRACTION RETINAL DETACHMENT (Right: Eye) MEMBRANE PEEL (Right: Eye) PHOTOCOAGULATION WITH LASER (Right: Eye) INSERTION OF GAS - C3F8 (Right: Eye) GAS/FLUID EXCHANGE (Right: Eye)     Patient location during evaluation: PACU Anesthesia Type: General Level of consciousness: awake and alert Pain management: pain level controlled Vital Signs Assessment: post-procedure vital signs reviewed and stable Respiratory status: spontaneous breathing, nonlabored ventilation, respiratory function stable and patient connected to nasal cannula oxygen Cardiovascular status: blood pressure returned to baseline and stable Postop Assessment: no apparent nausea or vomiting Anesthetic complications: no   No notable events documented.  Last Vitals:  Vitals:   07/19/21 1825 07/19/21 1840  BP: 128/68 135/65  Pulse: 65 65  Resp: 15 12  Temp:    SpO2: 95% 92%    Last Pain:  Vitals:   07/19/21 1840  TempSrc:   PainSc: 5    Pain Goal:                   [name] Merritt L [name] Merritt

## 2021-07-25 ENCOUNTER — Other Ambulatory Visit (HOSPITAL_COMMUNITY): Payer: Self-pay

## 2021-07-27 DIAGNOSIS — H25813 Combined forms of age-related cataract, bilateral: Secondary | ICD-10-CM | POA: Diagnosis not present

## 2021-07-27 DIAGNOSIS — E113512 Type 2 diabetes mellitus with proliferative diabetic retinopathy with macular edema, left eye: Secondary | ICD-10-CM | POA: Diagnosis not present

## 2021-07-27 DIAGNOSIS — H3582 Retinal ischemia: Secondary | ICD-10-CM | POA: Diagnosis not present

## 2021-07-29 DIAGNOSIS — E113512 Type 2 diabetes mellitus with proliferative diabetic retinopathy with macular edema, left eye: Secondary | ICD-10-CM | POA: Diagnosis not present

## 2021-08-04 ENCOUNTER — Other Ambulatory Visit (HOSPITAL_COMMUNITY): Payer: Self-pay

## 2021-08-04 ENCOUNTER — Other Ambulatory Visit: Payer: Self-pay | Admitting: Cardiovascular Disease

## 2021-08-10 ENCOUNTER — Other Ambulatory Visit: Payer: Self-pay | Admitting: Internal Medicine

## 2021-08-11 ENCOUNTER — Other Ambulatory Visit (HOSPITAL_COMMUNITY): Payer: Self-pay

## 2021-08-16 ENCOUNTER — Other Ambulatory Visit (HOSPITAL_COMMUNITY): Payer: Self-pay

## 2021-08-18 ENCOUNTER — Other Ambulatory Visit (HOSPITAL_COMMUNITY): Payer: Self-pay

## 2021-08-27 ENCOUNTER — Other Ambulatory Visit: Payer: Self-pay | Admitting: Physician Assistant

## 2021-08-29 ENCOUNTER — Other Ambulatory Visit (HOSPITAL_COMMUNITY): Payer: Self-pay

## 2021-08-29 MED ORDER — APIXABAN 5 MG PO TABS
ORAL_TABLET | Freq: Two times a day (BID) | ORAL | 1 refills | Status: DC
  Filled 2021-08-29: qty 180, 90d supply, fill #0
  Filled 2021-12-28: qty 180, 90d supply, fill #1

## 2021-08-29 NOTE — Telephone Encounter (Signed)
Prescription refill request for Eliquis received. Indication:A Flutter Last office visit:9/22 Scr:0.9 Age: 50 Weight:147.9 kg  Prescription refilled

## 2021-08-30 ENCOUNTER — Telehealth: Payer: Self-pay | Admitting: *Deleted

## 2021-08-30 NOTE — Telephone Encounter (Signed)
My chart message sent to the patient to give him a update on his CPAP machine.

## 2021-09-01 DIAGNOSIS — E113512 Type 2 diabetes mellitus with proliferative diabetic retinopathy with macular edema, left eye: Secondary | ICD-10-CM | POA: Diagnosis not present

## 2021-09-12 ENCOUNTER — Other Ambulatory Visit (HOSPITAL_COMMUNITY): Payer: Self-pay

## 2021-09-13 DIAGNOSIS — E113511 Type 2 diabetes mellitus with proliferative diabetic retinopathy with macular edema, right eye: Secondary | ICD-10-CM | POA: Diagnosis not present

## 2021-09-14 ENCOUNTER — Other Ambulatory Visit (HOSPITAL_COMMUNITY): Payer: Self-pay

## 2021-09-14 DIAGNOSIS — H4311 Vitreous hemorrhage, right eye: Secondary | ICD-10-CM | POA: Diagnosis not present

## 2021-09-14 DIAGNOSIS — H53131 Sudden visual loss, right eye: Secondary | ICD-10-CM | POA: Diagnosis not present

## 2021-09-14 DIAGNOSIS — J209 Acute bronchitis, unspecified: Secondary | ICD-10-CM | POA: Diagnosis not present

## 2021-09-14 DIAGNOSIS — E113511 Type 2 diabetes mellitus with proliferative diabetic retinopathy with macular edema, right eye: Secondary | ICD-10-CM | POA: Diagnosis not present

## 2021-09-14 MED ORDER — PREDNISONE 10 MG (21) PO TBPK
ORAL_TABLET | ORAL | 0 refills | Status: DC
  Filled 2021-09-14: qty 21, 6d supply, fill #0

## 2021-09-14 MED ORDER — AZITHROMYCIN 250 MG PO TABS
ORAL_TABLET | ORAL | 0 refills | Status: DC
  Filled 2021-09-14: qty 6, 5d supply, fill #0

## 2021-09-14 MED ORDER — HYDROCOD POLST-CPM POLST ER 10-8 MG/5ML PO SUER
5.0000 mL | Freq: Two times a day (BID) | ORAL | 0 refills | Status: DC | PRN
  Filled 2021-09-14: qty 100, 10d supply, fill #0

## 2021-09-16 ENCOUNTER — Other Ambulatory Visit (HOSPITAL_COMMUNITY): Payer: Self-pay

## 2021-09-21 ENCOUNTER — Other Ambulatory Visit (HOSPITAL_COMMUNITY): Payer: Self-pay

## 2021-09-21 DIAGNOSIS — E113593 Type 2 diabetes mellitus with proliferative diabetic retinopathy without macular edema, bilateral: Secondary | ICD-10-CM | POA: Diagnosis not present

## 2021-09-21 DIAGNOSIS — I4892 Unspecified atrial flutter: Secondary | ICD-10-CM | POA: Diagnosis not present

## 2021-09-21 DIAGNOSIS — E1169 Type 2 diabetes mellitus with other specified complication: Secondary | ICD-10-CM | POA: Diagnosis not present

## 2021-09-21 DIAGNOSIS — I2510 Atherosclerotic heart disease of native coronary artery without angina pectoris: Secondary | ICD-10-CM | POA: Diagnosis not present

## 2021-09-21 DIAGNOSIS — Z7984 Long term (current) use of oral hypoglycemic drugs: Secondary | ICD-10-CM | POA: Diagnosis not present

## 2021-09-21 DIAGNOSIS — Z23 Encounter for immunization: Secondary | ICD-10-CM | POA: Diagnosis not present

## 2021-09-21 DIAGNOSIS — I255 Ischemic cardiomyopathy: Secondary | ICD-10-CM | POA: Diagnosis not present

## 2021-09-21 DIAGNOSIS — K219 Gastro-esophageal reflux disease without esophagitis: Secondary | ICD-10-CM | POA: Diagnosis not present

## 2021-09-21 DIAGNOSIS — G4733 Obstructive sleep apnea (adult) (pediatric): Secondary | ICD-10-CM | POA: Diagnosis not present

## 2021-09-21 MED ORDER — PANTOPRAZOLE SODIUM 40 MG PO TBEC
40.0000 mg | DELAYED_RELEASE_TABLET | Freq: Every day | ORAL | 6 refills | Status: DC
  Filled 2021-09-21: qty 30, 30d supply, fill #0

## 2021-09-22 ENCOUNTER — Other Ambulatory Visit (HOSPITAL_COMMUNITY): Payer: Self-pay

## 2021-09-22 DIAGNOSIS — H25813 Combined forms of age-related cataract, bilateral: Secondary | ICD-10-CM | POA: Diagnosis not present

## 2021-09-22 DIAGNOSIS — H3582 Retinal ischemia: Secondary | ICD-10-CM | POA: Diagnosis not present

## 2021-09-22 DIAGNOSIS — E113512 Type 2 diabetes mellitus with proliferative diabetic retinopathy with macular edema, left eye: Secondary | ICD-10-CM | POA: Diagnosis not present

## 2021-09-22 MED ORDER — PANTOPRAZOLE SODIUM 40 MG PO TBEC
40.0000 mg | DELAYED_RELEASE_TABLET | Freq: Two times a day (BID) | ORAL | 3 refills | Status: DC
  Filled 2021-09-22 – 2021-10-06 (×3): qty 180, 90d supply, fill #0
  Filled 2022-01-04: qty 180, 90d supply, fill #1
  Filled 2022-03-30: qty 180, 90d supply, fill #2
  Filled 2022-07-10: qty 180, 90d supply, fill #3

## 2021-09-23 ENCOUNTER — Other Ambulatory Visit (HOSPITAL_COMMUNITY): Payer: Self-pay

## 2021-09-23 MED ORDER — METFORMIN HCL 1000 MG PO TABS
ORAL_TABLET | ORAL | 3 refills | Status: DC
  Filled 2021-09-23: qty 90, 90d supply, fill #0
  Filled 2021-12-08: qty 90, 90d supply, fill #1
  Filled 2022-02-13 – 2022-02-15 (×3): qty 90, 90d supply, fill #2
  Filled 2022-04-14: qty 90, 90d supply, fill #3

## 2021-09-26 ENCOUNTER — Encounter (HOSPITAL_COMMUNITY): Payer: Self-pay | Admitting: Ophthalmology

## 2021-09-26 ENCOUNTER — Other Ambulatory Visit: Payer: Self-pay

## 2021-09-26 NOTE — Progress Notes (Addendum)
[name] Merritt denies chest pain or shortness of breath. Patient denies having any s/s of Covid in his household.  Patient denies any known exposure to Covid.   [name] Merritt has type II diabetes. Patient reports that CBG run 100- 160, patient checks it every 3 hours.  I instructed patient to take 32 units of Semglee Insulin this evening, do not take any oral agents for DM in am. I instructed patient to check CBG after awaking and every 2 hours until arrival  to the hospital.  I Instructed patient if CBG is less than 70 to take 4 Glucose Tablets or 1 tube of Glucose Gel or 1/2 cup of a clear juice. Recheck CBG in 15 minutes if CBG is not over 70 call, pre- op desk at [phone] for further instructions.   [name] Merritt takes Eliquis and Plavix, patient reports that he was not given instructions to stop. [name] Merritt did not take Plavix or Eliquis when he had surgery in September 2022.  I called Dr. Eliane Decree office in September, no one called me back with instructions. I instructed patient to hold Plavix and Eliquis in am.  PCP is Dr. Elvera Lennox. Polite. Cardiologist is Dr. Timoteo Expose.   I instructed [name] Merritt. to shower with antibiotic soap, if it is available.  Dry off with a clean towel. Do not put lotion, powder, cologne or deodorant or makeup.No jewelry or piercings. Men may shave their face and neck. Woman should not shave. No nail polish, artificial or acrylic nails. Wear clean clothes, brush your teeth. Glasses, contact lens,dentures or partials may not be worn in the OR. If you need to wear them, please bring a case for glasses, do not wear contacts or bring a case, the hospital does not have contact cases, dentures or partials will have to be removed , make sure they are clean, we will provide a denture cup to put them in. You will need some one to drive you home and a responsible person over the age of 59 to stay with you for the first 24 hours after surgery.

## 2021-09-27 ENCOUNTER — Encounter (HOSPITAL_COMMUNITY): Payer: Self-pay | Admitting: Ophthalmology

## 2021-09-27 ENCOUNTER — Other Ambulatory Visit: Payer: Self-pay

## 2021-09-27 ENCOUNTER — Ambulatory Visit (HOSPITAL_COMMUNITY)
Admission: RE | Admit: 2021-09-27 | Discharge: 2021-09-27 | Disposition: A | Discharge status: Home / Self Care | Payer: 59 | Attending: Ophthalmology | Admitting: Ophthalmology

## 2021-09-27 ENCOUNTER — Encounter (HOSPITAL_COMMUNITY)
Admission: RE | Disposition: A | Discharge status: Home / Self Care | Payer: Self-pay | Source: Home / Self Care | Attending: Ophthalmology

## 2021-09-27 ENCOUNTER — Ambulatory Visit (HOSPITAL_COMMUNITY): Payer: 59 | Admitting: Certified Registered"

## 2021-09-27 DIAGNOSIS — H4311 Vitreous hemorrhage, right eye: Secondary | ICD-10-CM | POA: Insufficient documentation

## 2021-09-27 DIAGNOSIS — Z794 Long term (current) use of insulin: Secondary | ICD-10-CM | POA: Diagnosis not present

## 2021-09-27 DIAGNOSIS — E113591 Type 2 diabetes mellitus with proliferative diabetic retinopathy without macular edema, right eye: Secondary | ICD-10-CM | POA: Insufficient documentation

## 2021-09-27 DIAGNOSIS — E7800 Pure hypercholesterolemia, unspecified: Secondary | ICD-10-CM | POA: Diagnosis not present

## 2021-09-27 DIAGNOSIS — E1165 Type 2 diabetes mellitus with hyperglycemia: Secondary | ICD-10-CM | POA: Diagnosis not present

## 2021-09-27 HISTORY — DX: Anxiety disorder, unspecified: F41.9

## 2021-09-27 HISTORY — PX: PHOTOCOAGULATION WITH LASER: SHX6027

## 2021-09-27 HISTORY — PX: PARS PLANA VITRECTOMY 27 GAUGE: SHX6738

## 2021-09-27 LAB — GLUCOSE, CAPILLARY
Glucose-Capillary: 104 mg/dL — ABNORMAL HIGH (ref 70–99)
Glucose-Capillary: 84 mg/dL (ref 70–99)
Glucose-Capillary: 78 mg/dL (ref 70–99)

## 2021-09-27 LAB — BASIC METABOLIC PANEL
Anion gap: 6 (ref 5–15)
BUN: 16 mg/dL (ref 6–20)
CO2: 23 mmol/L (ref 22–32)
Calcium: 8.8 mg/dL — ABNORMAL LOW (ref 8.9–10.3)
Chloride: 105 mmol/L (ref 98–111)
Creatinine, Ser: 0.97 mg/dL (ref 0.61–1.24)
GFR, Estimated: >60 mL/min (ref >60)
Glucose, Bld: 112 mg/dL — ABNORMAL HIGH (ref 70–99)
Potassium: 6.1 mmol/L — ABNORMAL HIGH (ref 3.5–5.1)
Sodium: 134 mmol/L — ABNORMAL LOW (ref 135–145)

## 2021-09-27 LAB — POCT I-STAT, CHEM 8
BUN: 16 mg/dL (ref 6–20)
Calcium, Ion: 1.19 mmol/L (ref 1.15–1.40)
Chloride: 101 mmol/L (ref 98–111)
Creatinine, Ser: 0.9 mg/dL (ref 0.61–1.24)
Glucose, Bld: 105 mg/dL — ABNORMAL HIGH (ref 70–99)
HCT: 46 % (ref 39.0–52.0)
Hemoglobin: 15.6 g/dL (ref 13.0–17.0)
Potassium: 4.4 mmol/L (ref 3.5–5.1)
Sodium: 138 mmol/L (ref 135–145)
TCO2: 25 mmol/L (ref 22–32)

## 2021-09-27 LAB — SURGICAL PCR SCREEN
MRSA, PCR: NEGATIVE
Staphylococcus aureus: NEGATIVE

## 2021-09-27 SURGERY — PARS PLANA VITRECTOMY 27 GAUGE
Anesthesia: General | Site: Eye | Laterality: Right

## 2021-09-27 MED ORDER — CHLORHEXIDINE GLUCONATE 0.12 % MT SOLN
OROMUCOSAL | Status: AC
  Administered 2021-09-27: 15 mL via OROMUCOSAL
  Filled 2021-09-27: qty 15

## 2021-09-27 MED ORDER — FENTANYL CITRATE (PF) 100 MCG/2ML IJ SOLN: 25.0000 ug | INTRAMUSCULAR | Status: DC | PRN

## 2021-09-27 MED ORDER — OXYCODONE HCL 5 MG PO TABS: 5.0000 mg | ORAL_TABLET | Freq: Once | ORAL | Status: DC | PRN

## 2021-09-27 MED ORDER — PHENYLEPHRINE 40 MCG/ML (10ML) SYRINGE FOR IV PUSH (FOR BLOOD PRESSURE SUPPORT)
PREFILLED_SYRINGE | INTRAVENOUS | Status: DC | PRN
  Administered 2021-09-27 (×2): 80 ug via INTRAVENOUS

## 2021-09-27 MED ORDER — CHLORHEXIDINE GLUCONATE 0.12 % MT SOLN: 15.0000 mL | Freq: Once | OROMUCOSAL | Status: AC

## 2021-09-27 MED ORDER — LIDOCAINE HCL 2 % IJ SOLN
INTRAMUSCULAR | Status: AC
  Filled 2021-09-27: qty 20

## 2021-09-27 MED ORDER — SUGAMMADEX SODIUM 200 MG/2ML IV SOLN
INTRAVENOUS | Status: DC | PRN
Start: 2021-09-27 — End: 2021-09-27
  Administered 2021-09-27: 300 mg via INTRAVENOUS

## 2021-09-27 MED ORDER — OXYCODONE HCL 5 MG/5ML PO SOLN: 5.0000 mg | Freq: Once | ORAL | Status: DC | PRN

## 2021-09-27 MED ORDER — BSS IO SOLN
INTRAOCULAR | Status: DC | PRN
  Administered 2021-09-27: 15 mL via INTRAOCULAR

## 2021-09-27 MED ORDER — ONDANSETRON HCL 4 MG/2ML IJ SOLN
INTRAMUSCULAR | Status: DC | PRN
  Administered 2021-09-27: 4 mg via INTRAVENOUS

## 2021-09-27 MED ORDER — HYALURONIDASE HUMAN 150 UNIT/ML IJ SOLN
INTRAMUSCULAR | Status: AC
  Filled 2021-09-27: qty 1

## 2021-09-27 MED ORDER — DEXAMETHASONE SODIUM PHOSPHATE 10 MG/ML IJ SOLN
INTRAMUSCULAR | Status: DC | PRN
  Administered 2021-09-27: 10 mg

## 2021-09-27 MED ORDER — MIDAZOLAM HCL 2 MG/2ML IJ SOLN
INTRAMUSCULAR | Status: DC | PRN
  Administered 2021-09-27: 2 mg via INTRAVENOUS

## 2021-09-27 MED ORDER — CEFAZOLIN SUBCONJUNCTIVAL INJECTION 100 MG/0.5 ML
INJECTION | SUBCONJUNCTIVAL | Status: DC | PRN
  Administered 2021-09-27: 100 mg via SUBCONJUNCTIVAL

## 2021-09-27 MED ORDER — EPINEPHRINE PF 1 MG/ML IJ SOLN
INTRAOCULAR | Status: DC | PRN
  Administered 2021-09-27: 500 mL

## 2021-09-27 MED ORDER — NA CHONDROIT SULF-NA HYALURON 40-30 MG/ML IO SOSY
INTRAOCULAR | Status: AC
  Filled 2021-09-27: qty 0.5

## 2021-09-27 MED ORDER — BSS PLUS IO SOLN
INTRAOCULAR | Status: AC
  Filled 2021-09-27: qty 500

## 2021-09-27 MED ORDER — TOBRAMYCIN-DEXAMETHASONE 0.3-0.1 % OP OINT
TOPICAL_OINTMENT | OPHTHALMIC | Status: AC
  Filled 2021-09-27: qty 3.5

## 2021-09-27 MED ORDER — CEFAZOLIN SUBCONJUNCTIVAL INJECTION 100 MG/0.5 ML
100.0000 mg | INJECTION | SUBCONJUNCTIVAL | Status: DC
  Filled 2021-09-27: qty 5

## 2021-09-27 MED ORDER — DEXAMETHASONE SODIUM PHOSPHATE 10 MG/ML IJ SOLN
INTRAMUSCULAR | Status: DC | PRN
  Administered 2021-09-27: 5 mg via INTRAVENOUS

## 2021-09-27 MED ORDER — ATROPINE SULFATE 1 % OP SOLN
OPHTHALMIC | Status: AC
  Filled 2021-09-27: qty 5

## 2021-09-27 MED ORDER — DEXAMETHASONE SODIUM PHOSPHATE 10 MG/ML IJ SOLN
INTRAMUSCULAR | Status: AC
  Filled 2021-09-27: qty 1

## 2021-09-27 MED ORDER — FENTANYL CITRATE (PF) 100 MCG/2ML IJ SOLN
INTRAMUSCULAR | Status: DC | PRN
  Administered 2021-09-27: 50 ug via INTRAVENOUS

## 2021-09-27 MED ORDER — SODIUM CHLORIDE 0.9 % IV SOLN: INTRAVENOUS | Status: DC

## 2021-09-27 MED ORDER — LIDOCAINE HCL 2 % IJ SOLN: INTRAMUSCULAR | Status: DC | PRN

## 2021-09-27 MED ORDER — ORAL CARE MOUTH RINSE: 15.0000 mL | Freq: Once | OROMUCOSAL | Status: AC

## 2021-09-27 MED ORDER — METOPROLOL SUCCINATE ER 25 MG PO TB24
25.0000 mg | ORAL_TABLET | Freq: Once | ORAL | Status: AC
  Administered 2021-09-27: 25 mg via ORAL
  Filled 2021-09-27: qty 1

## 2021-09-27 MED ORDER — SUCCINYLCHOLINE CHLORIDE 200 MG/10ML IV SOSY
PREFILLED_SYRINGE | INTRAVENOUS | Status: AC
  Filled 2021-09-27: qty 10

## 2021-09-27 MED ORDER — PROPOFOL 10 MG/ML IV BOLUS
INTRAVENOUS | Status: DC | PRN
  Administered 2021-09-27: 200 mg via INTRAVENOUS

## 2021-09-27 MED ORDER — PROPARACAINE HCL 0.5 % OP SOLN
1.0000 [drp] | OPHTHALMIC | Status: AC | PRN
  Administered 2021-09-27 (×3): 1 [drp] via OPHTHALMIC
  Filled 2021-09-27: qty 15

## 2021-09-27 MED ORDER — TROPICAMIDE 1 % OP SOLN
1.0000 [drp] | OPHTHALMIC | Status: AC | PRN
  Administered 2021-09-27 (×3): 1 [drp] via OPHTHALMIC
  Filled 2021-09-27: qty 15

## 2021-09-27 MED ORDER — TOBRAMYCIN-DEXAMETHASONE 0.3-0.1 % OP OINT
TOPICAL_OINTMENT | OPHTHALMIC | Status: DC | PRN
  Administered 2021-09-27: 1

## 2021-09-27 MED ORDER — PROPOFOL 10 MG/ML IV BOLUS
INTRAVENOUS | Status: AC
  Filled 2021-09-27: qty 20

## 2021-09-27 MED ORDER — ROCURONIUM BROMIDE 10 MG/ML (PF) SYRINGE
PREFILLED_SYRINGE | INTRAVENOUS | Status: AC
  Filled 2021-09-27: qty 10

## 2021-09-27 MED ORDER — FENTANYL CITRATE (PF) 250 MCG/5ML IJ SOLN
INTRAMUSCULAR | Status: AC
  Filled 2021-09-27: qty 5

## 2021-09-27 MED ORDER — EPINEPHRINE PF 1 MG/ML IJ SOLN
INTRAMUSCULAR | Status: AC
  Filled 2021-09-27: qty 1

## 2021-09-27 MED ORDER — PHENYLEPHRINE HCL 2.5 % OP SOLN
1.0000 [drp] | OPHTHALMIC | Status: AC | PRN
  Administered 2021-09-27 (×3): 1 [drp] via OPHTHALMIC
  Filled 2021-09-27: qty 2

## 2021-09-27 MED ORDER — SODIUM HYALURONATE 10 MG/ML IO SOLUTION
PREFILLED_SYRINGE | INTRAOCULAR | Status: AC
  Filled 2021-09-27: qty 0.85

## 2021-09-27 MED ORDER — MIDAZOLAM HCL 2 MG/2ML IJ SOLN
INTRAMUSCULAR | Status: AC
  Filled 2021-09-27: qty 2

## 2021-09-27 MED ORDER — ONDANSETRON HCL 4 MG/2ML IJ SOLN
INTRAMUSCULAR | Status: AC
  Filled 2021-09-27: qty 2

## 2021-09-27 MED ORDER — ONDANSETRON HCL 4 MG/2ML IJ SOLN: 4.0000 mg | Freq: Four times a day (QID) | INTRAMUSCULAR | Status: DC | PRN

## 2021-09-27 MED ORDER — NA CHONDROIT SULF-NA HYALURON 40-30 MG/ML IO SOSY
INTRAOCULAR | Status: DC | PRN
  Administered 2021-09-27: 0.5 mL via INTRAOCULAR

## 2021-09-27 MED ORDER — BSS IO SOLN
INTRAOCULAR | Status: AC
  Filled 2021-09-27: qty 15

## 2021-09-27 MED ORDER — BUPIVACAINE HCL (PF) 0.75 % IJ SOLN
INTRAMUSCULAR | Status: AC
  Filled 2021-09-27: qty 10

## 2021-09-27 MED ORDER — SUCCINYLCHOLINE CHLORIDE 200 MG/10ML IV SOSY
PREFILLED_SYRINGE | INTRAVENOUS | Status: DC | PRN
  Administered 2021-09-27: 120 mg via INTRAVENOUS

## 2021-09-27 MED ORDER — ROCURONIUM BROMIDE 10 MG/ML (PF) SYRINGE
PREFILLED_SYRINGE | INTRAVENOUS | Status: DC | PRN
  Administered 2021-09-27: 20 mg via INTRAVENOUS
  Administered 2021-09-27: 40 mg via INTRAVENOUS

## 2021-09-27 MED ORDER — OFLOXACIN 0.3 % OP SOLN
1.0000 [drp] | OPHTHALMIC | Status: AC | PRN
  Administered 2021-09-27 (×3): 1 [drp] via OPHTHALMIC
  Filled 2021-09-27: qty 5

## 2021-09-27 MED ORDER — LIDOCAINE 2% (20 MG/ML) 5 ML SYRINGE
INTRAMUSCULAR | Status: DC | PRN
  Administered 2021-09-27: 60 mg via INTRAVENOUS

## 2021-09-27 MED ORDER — PHENYLEPHRINE 40 MCG/ML (10ML) SYRINGE FOR IV PUSH (FOR BLOOD PRESSURE SUPPORT)
PREFILLED_SYRINGE | INTRAVENOUS | Status: AC
  Filled 2021-09-27: qty 10

## 2021-09-27 SURGICAL SUPPLY — 60 items
APPLICATOR COTTON TIP 6 STRL (MISCELLANEOUS) ×1 IMPLANT
APPLICATOR COTTON TIP 6IN STRL (MISCELLANEOUS) ×2 IMPLANT
BAG COUNTER SPONGE SURGICOUNT (BAG) ×2 IMPLANT
BAND WRIST GAS GREEN (MISCELLANEOUS) IMPLANT
BLADE MVR KNIFE 20G (BLADE) IMPLANT
BLADE STAB KNIFE 15DEG (BLADE) IMPLANT
CANNULA ANT CHAM MAIN (OPHTHALMIC RELATED) IMPLANT
CANNULA DUAL BORE 23G (CANNULA) IMPLANT
CANNULA VLV SOFT TIP 27GA (OPHTHALMIC) ×2 IMPLANT
CAUTERY EYE LOW TEMP 1300F FIN (OPHTHALMIC RELATED) IMPLANT
CLSR STERI-STRIP ANTIMIC 1/2X4 (GAUZE/BANDAGES/DRESSINGS) ×2 IMPLANT
COVER MAYO STAND STRL (DRAPES) IMPLANT
DRAPE HALF SHEET 40X57 (DRAPES) ×2 IMPLANT
DRAPE INCISE 51X51 W/FILM STRL (DRAPES) IMPLANT
DRAPE RETRACTOR (MISCELLANEOUS) ×2 IMPLANT
ERASER HMR WETFIELD 23G BP (MISCELLANEOUS) IMPLANT
FORCEPS ECKARDT ILM 25G SERR (OPHTHALMIC RELATED) IMPLANT
FORCEPS GRIESHABER ILM 25G A (INSTRUMENTS) IMPLANT
FORCEPS GRIESHABER ILM 27G (INSTRUMENTS) IMPLANT
GAS AUTO FILL CONSTEL (OPHTHALMIC)
GAS AUTO FILL CONSTELLATION (OPHTHALMIC) IMPLANT
GAS WRIST BAND GREEN (MISCELLANEOUS)
GLOVE SURG SYN 7.5  E (GLOVE) ×2
GLOVE SURG SYN 7.5 E (GLOVE) ×1 IMPLANT
GOWN STRL REUS W/ TWL LRG LVL3 (GOWN DISPOSABLE) ×1 IMPLANT
GOWN STRL REUS W/TWL LRG LVL3 (GOWN DISPOSABLE) ×2
KIT BASIN OR (CUSTOM PROCEDURE TRAY) ×2 IMPLANT
KIT TURNOVER KIT B (KITS) ×2 IMPLANT
LENS BIOM SUPER VIEW SET DISP (MISCELLANEOUS) ×2 IMPLANT
MICROPICK 25G (MISCELLANEOUS)
NEEDLE 18GX1X1/2 (RX/OR ONLY) (NEEDLE) ×2 IMPLANT
NEEDLE 25GX 5/8IN NON SAFETY (NEEDLE) ×2 IMPLANT
NEEDLE 27GX1/2 REG BEVEL ECLIP (NEEDLE) ×2 IMPLANT
NEEDLE FILTER BLUNT 18X 1/2SAF (NEEDLE) ×1
NEEDLE FILTER BLUNT 18X1 1/2 (NEEDLE) ×1 IMPLANT
NEEDLE HYPO 25GX1X1/2 BEV (NEEDLE) IMPLANT
NEEDLE HYPO 30X.5 LL (NEEDLE) ×4 IMPLANT
NEEDLE RETROBULBAR 25GX1.5 (NEEDLE) ×2 IMPLANT
NS IRRIG 1000ML POUR BTL (IV SOLUTION) ×2 IMPLANT
PACK FRAGMATOME (OPHTHALMIC) IMPLANT
PACK VITRECTOMY CUSTOM (CUSTOM PROCEDURE TRAY) ×2 IMPLANT
PAD ARMBOARD 7.5X6 YLW CONV (MISCELLANEOUS) ×4 IMPLANT
PAK VITRECTOMY PIK  27GA (OPHTHALMIC) ×2
PAK VITRECTOMY PIK 27GA (OPHTHALMIC) ×1 IMPLANT
PICK MICROPICK 25G (MISCELLANEOUS) IMPLANT
PROBE DIATHERMY DSP 27GA (MISCELLANEOUS) IMPLANT
PROBE LASER ILLUM FLEX 27GA (OPHTHALMIC) IMPLANT
ROLLS DENTAL (MISCELLANEOUS) IMPLANT
SCRAPER DIAMOND 25GA (OPHTHALMIC RELATED) IMPLANT
SOL ANTI FOG 6CC (MISCELLANEOUS) ×1 IMPLANT
SOLUTION ANTI FOG 6CC (MISCELLANEOUS) ×1
STOPCOCK 4 WAY LG BORE MALE ST (IV SETS) IMPLANT
SUT VICRYL 7 0 TG140 8 (SUTURE) ×2 IMPLANT
SUT VICRYL 8 0 TG140 8 (SUTURE) IMPLANT
SYR 10ML LL (SYRINGE) IMPLANT
SYR 20ML LL LF (SYRINGE) ×2 IMPLANT
SYR 5ML LL (SYRINGE) ×2 IMPLANT
SYR TB 1ML LUER SLIP (SYRINGE) IMPLANT
WATER STERILE IRR 1000ML POUR (IV SOLUTION) ×2 IMPLANT
WIPE INSTRUMENT VISIWIPE 73X73 (MISCELLANEOUS) IMPLANT

## 2021-09-27 NOTE — Op Note (Signed)
[name] Merritt 09/27/2021 Diagnosis: Vitreous hemorrhage right eye   Procedure: Pars Plana Vitrectomy and Endolaser Operative Eye:  right eye  Surgeon: Harrold Donath Estimated Blood Loss: minimal Specimens for Pathology:  None Complications: none   The  patient was prepped and draped in the usual fashion for ocular surgery on the  right eye .  A lid speculum was placed.  Infusion line and trocar was placed at the 8 o'clock position approximately 3.5 mm from the surgical limbus.   The infusion line was allowed to run and then clamped when placed at the cannula opening. The line was inserted and secured to the drape with an adhesive strip.   Active trocars/cannula were placed at the 10 and 2 o'clock positions approximately 3.5 mm from the surgical limbus. The cannula was visualized in the vitreous cavity.  The light pipe and vitreous cutter were inserted into the vitreous cavity and the vitreous hemorrhage was removed with the vitrector.  The silicone soft tipped extrusion cannula was used to remove any remaining hemorrhage.  Endolaser were applied to the areas of neovascular fronds with associated hemorrhage.    Scleral depressed examination confirmed no other areas of hemorrhage.  A partial air-fluid exchange was performed.  The superior cannulas were sequentially removed with concommitant tamponade using a cotton tipped applicator and noted to be air tight.  The infusion line and trocar were removed and the sclerotomy was noted to be air tight with normal intraocular pressure by digital palpapation.  Subconjunctival injections of  Ancef and Dexamethasone 4mg /66ml were placed in the infero-medial quadrant.   The speculum and drapes were removed and the eye was patched with Polymixin/Bacitracin ophthalmic ointment. An eye shield was placed and the patient was transferred alert and conversant with stable vital signs to the post operative recovery area.  The patient tolerated the  procedure well and no complications were noted.  0m MD

## 2021-09-27 NOTE — Anesthesia Procedure Notes (Addendum)
Procedure Name: Intubation Date/Time: 09/27/2021 4:06 PM Performed by: De Nurse, CRNA Pre-anesthesia Checklist: Patient identified, Emergency Drugs available, Suction available and Patient being monitored Patient Re-evaluated:Patient Re-evaluated prior to induction Oxygen Delivery Method: Circle System Utilized Preoxygenation: Pre-oxygenation with 100% oxygen Induction Type: IV induction and Rapid sequence Laryngoscope Size: Glidescope and 4 Grade View: Grade I Tube type: Oral Tube size: 7.5 mm Number of attempts: 1 Airway Equipment and Method: Stylet and Oral airway Placement Confirmation: ETT inserted through vocal cords under direct vision, positive ETCO2 and breath sounds checked- equal and bilateral Secured at: 22 cm Tube secured with: Tape Dental Injury: Teeth and Oropharynx as per pre-operative assessment  Difficulty Due To: Difficulty was anticipated, Difficult Airway- due to large tongue and Difficult Airway- due to limited oral opening Future Recommendations: Recommend- induction with short-acting agent, and alternative techniques readily available Comments: Elective Glidescope d/t previous difficult intubation notes, recommend glidescope use

## 2021-09-27 NOTE — Anesthesia Preprocedure Evaluation (Signed)
Anesthesia Evaluation  Patient identified by MRN, date of birth, ID band Patient awake    Reviewed: Allergy & Precautions, H&P , NPO status , Patient's Chart, lab work & pertinent test results  Airway Mallampati: II   Neck ROM: full    Dental   Pulmonary sleep apnea ,    breath sounds clear to auscultation       Cardiovascular hypertension, + angina + CAD, + Past MI, + Cardiac Stents and +CHF  + dysrhythmias Atrial Fibrillation  Rhythm:regular Rate:Normal  TTE (02/2021): EF 55-60% H/o stent to LAD   Neuro/Psych PSYCHIATRIC DISORDERS Anxiety    GI/Hepatic GERD  ,  Endo/Other  diabetes, Type obesity  Renal/GU      Musculoskeletal   Abdominal   Peds  Hematology   Anesthesia Other Findings   Reproductive/Obstetrics                             Anesthesia Physical Anesthesia Plan  ASA: 3  Anesthesia Plan: General   Post-op Pain Management:    Induction: Intravenous  PONV Risk Score and Plan: 2 and Ondansetron, Dexamethasone, Midazolam and Treatment may vary due to age or medical condition  Airway Management Planned: Oral ETT  Additional Equipment:   Intra-op Plan:   Post-operative Plan: Extubation in OR  Informed Consent: I have reviewed the patients History and Physical, chart, labs and discussed the procedure including the risks, benefits and alternatives for the proposed anesthesia with the patient or authorized representative who has indicated his/her understanding and acceptance.     Dental advisory given  Plan Discussed with: CRNA, Anesthesiologist and Surgeon  Anesthesia Plan Comments:         Anesthesia Quick Evaluation

## 2021-09-27 NOTE — H&P (Signed)
Date of examination:  09/27/21  Indication for surgery: vitreous hemorrhage right eye  Pertinent past medical history:  Past Medical History:  Diagnosis Date   Anxiety    CHF (congestive heart failure) (HCC)    Coronary artery disease    Diabetes mellitus without complication (HCC)    type II   GERD (gastroesophageal reflux disease)    not currently   High cholesterol    Hypertension    Myocardial infarction (HCC) 05/30/2020   New onset atrial flutter (HCC) 06/09/2020   Overweight    Sleep apnea     Pertinent ocular history:  proliferative diabetic retinopathy  Pertinent family history:  Family History  Problem Relation Age of Onset   Hypertension Mother    Diabetes Mother    Hypertension Father    Diabetes Father    Diabetes Maternal Grandmother    Hypertension Maternal Grandmother    Diabetes Maternal Grandfather    Hypertension Maternal Grandfather    Diabetes Paternal Grandmother    Hypertension Paternal Grandmother    Diabetes Paternal Grandfather    Hypertension Paternal Grandfather    Asthma Neg Hx     General:  Healthy appearing patient in no distress.    Eyes:    Acuity OD CF     External: Within normal limits      Anterior segment: Within normal limits         Fundus: vitreous hemorrhage right eye     Impression: vitreous hemorrhage right eye  Plan:  Vitrectomy with endolaser right eye  Carmela Rima, MD

## 2021-09-27 NOTE — Discharge Instructions (Addendum)
DO NOT SLEEP ON BACK, THE EYE PRESSURE CAN GO UP AND CAUSE VISION LOSS   SLEEP ON SIDE WITH NOSE TO PILLOW  DURING DAY KEEP UPRIGHT  SLEEP WITH HEAD ELEVATED  DO NOT STRAIN

## 2021-09-27 NOTE — Brief Op Note (Signed)
09/27/2021  5:13 PM  PATIENT:  [name] Merritt  50 y.o. male  PRE-OPERATIVE DIAGNOSIS:  Victrectomy hem Right Eye  POST-OPERATIVE DIAGNOSIS:  Victrectomy hem Right Eye  PROCEDURE:  Procedure(s): PARS PLANA VITRECTOMY 27 GAUGE (Right) PHOTOCOAGULATION WITH LASER (Right)  SURGEON:  Surgeon(s) and Role:    * Carmela Rima, MD - Primary  PHYSICIAN ASSISTANT:   ASSISTANTS: none   ANESTHESIA:   local and general  EBL:  minimal   BLOOD ADMINISTERED:none  DRAINS: none   LOCAL MEDICATIONS USED:  MARCAINE    and LIDOCAINE   SPECIMEN:  No Specimen  DISPOSITION OF SPECIMEN:  N/A  COUNTS:  YES  TOURNIQUET:  * No tourniquets in log *  DICTATION: .Note written in EPIC  PLAN OF CARE: Discharge to home after PACU  PATIENT DISPOSITION:  PACU - hemodynamically stable.   Delay start of Pharmacological VTE agent (>24hrs) due to surgical blood loss or risk of bleeding: not applicable

## 2021-09-27 NOTE — Progress Notes (Signed)
Notified Dr. Phoebe Perch of bmet results. Potassium 6.1. Orders to draw an I-stat.  Potassium 4.4. Dr. Phoebe Perch aware of results.

## 2021-09-27 NOTE — Transfer of Care (Signed)
Immediate Anesthesia Transfer of Care Note  Patient: [name] Merritt  Procedure(s) Performed: PARS PLANA VITRECTOMY 27 GAUGE (Right: Eye) PHOTOCOAGULATION WITH LASER (Right: Eye)  Patient Location: PACU  Anesthesia Type:General  Level of Consciousness: awake, alert  and oriented  Airway & Oxygen Therapy: Patient Spontanous Breathing  Post-op Assessment: Report given to RN  Post vital signs: Reviewed and stable  Last Vitals:  Vitals Value Taken Time  BP 90/61 09/27/21 1715  Temp    Pulse 66 09/27/21 1716  Resp 16 09/27/21 1716  SpO2 97 % 09/27/21 1716  Vitals shown include unvalidated device data.  Last Pain:  Vitals:   09/27/21 1306  TempSrc:   PainSc: 0-No pain         Complications: No notable events documented.

## 2021-09-28 ENCOUNTER — Other Ambulatory Visit (HOSPITAL_COMMUNITY): Payer: Self-pay

## 2021-09-28 ENCOUNTER — Encounter (HOSPITAL_COMMUNITY): Payer: Self-pay | Admitting: Ophthalmology

## 2021-09-28 MED ORDER — OFLOXACIN 0.3 % OP SOLN
1.0000 [drp] | Freq: Four times a day (QID) | OPHTHALMIC | 0 refills | Status: DC
  Filled 2021-09-28: qty 5, 25d supply, fill #0

## 2021-09-28 MED ORDER — PREDNISOLONE ACETATE 1 % OP SUSP
OPHTHALMIC | 0 refills | Status: AC
  Filled 2021-09-28: qty 5, 28d supply, fill #0

## 2021-09-28 NOTE — Anesthesia Postprocedure Evaluation (Signed)
Anesthesia Post Note  Patient: [name] Merritt  Procedure(s) Performed: PARS PLANA VITRECTOMY 27 GAUGE (Right: Eye) PHOTOCOAGULATION WITH LASER (Right: Eye)     Patient location during evaluation: PACU Anesthesia Type: General Level of consciousness: awake and alert Pain management: pain level controlled Vital Signs Assessment: post-procedure vital signs reviewed and stable Respiratory status: spontaneous breathing, nonlabored ventilation, respiratory function stable and patient connected to nasal cannula oxygen Cardiovascular status: blood pressure returned to baseline and stable Postop Assessment: no apparent nausea or vomiting Anesthetic complications: no   No notable events documented.  Last Vitals:  Vitals:   09/27/21 1730 09/27/21 1745  BP: 101/83 118/67  Pulse: 63 64  Resp: 15 18  Temp:  36.4 C  SpO2: 100% 96%    Last Pain:  Vitals:   09/27/21 1745  TempSrc:   PainSc: 0-No pain                 Kennieth Rad

## 2021-10-04 DIAGNOSIS — E113512 Type 2 diabetes mellitus with proliferative diabetic retinopathy with macular edema, left eye: Secondary | ICD-10-CM | POA: Diagnosis not present

## 2021-10-05 ENCOUNTER — Other Ambulatory Visit (HOSPITAL_COMMUNITY): Payer: Self-pay

## 2021-10-05 ENCOUNTER — Other Ambulatory Visit: Payer: Self-pay | Admitting: Cardiovascular Disease

## 2021-10-05 MED ORDER — ALPRAZOLAM 0.25 MG PO TABS
0.2500 mg | ORAL_TABLET | Freq: Every day | ORAL | 0 refills | Status: DC | PRN
  Filled 2021-10-05: qty 20, 20d supply, fill #0

## 2021-10-06 ENCOUNTER — Other Ambulatory Visit (HOSPITAL_COMMUNITY): Payer: Self-pay

## 2021-10-06 MED ORDER — SPIRONOLACTONE 25 MG PO TABS
12.5000 mg | ORAL_TABLET | Freq: Every day | ORAL | 1 refills | Status: DC
  Filled 2021-10-06: qty 30, 60d supply, fill #0

## 2021-10-11 DIAGNOSIS — G4733 Obstructive sleep apnea (adult) (pediatric): Secondary | ICD-10-CM | POA: Diagnosis not present

## 2021-10-12 ENCOUNTER — Other Ambulatory Visit: Payer: Self-pay | Admitting: Cardiovascular Disease

## 2021-10-12 ENCOUNTER — Telehealth: Payer: Self-pay | Admitting: Cardiovascular Disease

## 2021-10-12 NOTE — Telephone Encounter (Signed)
Called to schedule sleep compliance appt, MB full. Patient due by 01/09/22

## 2021-10-13 ENCOUNTER — Other Ambulatory Visit (HOSPITAL_COMMUNITY): Payer: Self-pay

## 2021-10-13 MED ORDER — DAPAGLIFLOZIN PROPANEDIOL 5 MG PO TABS
5.0000 mg | ORAL_TABLET | Freq: Every day | ORAL | 3 refills | Status: DC
  Filled 2021-10-13: qty 90, 90d supply, fill #0

## 2021-10-14 DIAGNOSIS — E113511 Type 2 diabetes mellitus with proliferative diabetic retinopathy with macular edema, right eye: Secondary | ICD-10-CM | POA: Diagnosis not present

## 2021-10-22 DIAGNOSIS — Z760 Encounter for issue of repeat prescription: Secondary | ICD-10-CM | POA: Diagnosis not present

## 2021-10-26 ENCOUNTER — Other Ambulatory Visit (HOSPITAL_COMMUNITY): Payer: Self-pay

## 2021-10-27 ENCOUNTER — Other Ambulatory Visit (HOSPITAL_COMMUNITY): Payer: Self-pay

## 2021-10-28 ENCOUNTER — Other Ambulatory Visit (HOSPITAL_COMMUNITY): Payer: Self-pay

## 2021-11-11 DIAGNOSIS — G4733 Obstructive sleep apnea (adult) (pediatric): Secondary | ICD-10-CM | POA: Diagnosis not present

## 2021-11-16 ENCOUNTER — Other Ambulatory Visit: Payer: Self-pay | Admitting: Internal Medicine

## 2021-11-16 IMAGING — DX DG CHEST 1V PORT
1 series · 1 of 1 positions shown · non-contrast
Comparison: 12/31/2019

CLINICAL DATA: Chest pain, fever

EXAM:
PORTABLE CHEST 1 VIEW

[chest]
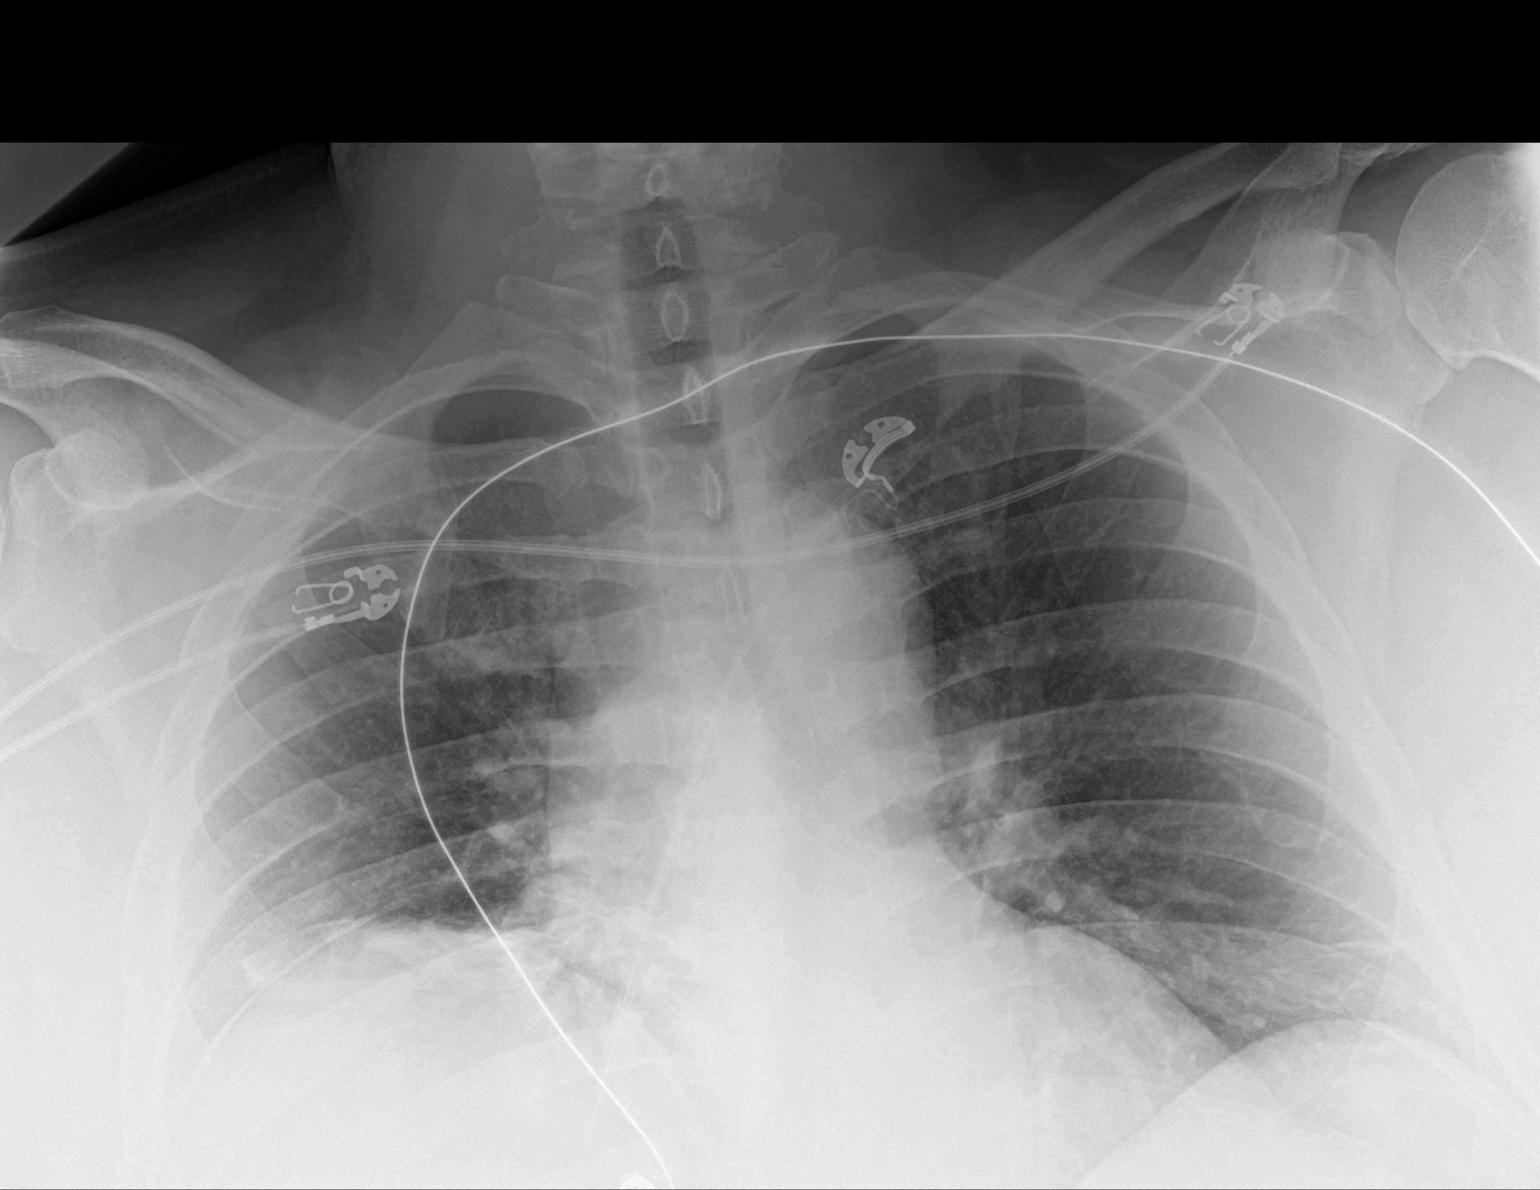

[1 of 1 positions shown; findings below may reference images not displayed]

FINDINGS: The heart size and mediastinal contours are within normal limits.
Unchanged elevation of the right hemidiaphragm. No acute airspace
opacity. The visualized skeletal structures are unremarkable.
IMPRESSION: No acute abnormality of the lungs in portable projection. Unchanged
elevation of the right hemidiaphragm. No acute airspace opacity.

## 2021-11-17 ENCOUNTER — Other Ambulatory Visit: Payer: Self-pay

## 2021-11-17 ENCOUNTER — Other Ambulatory Visit (HOSPITAL_COMMUNITY): Payer: Self-pay

## 2021-11-17 ENCOUNTER — Ambulatory Visit (INDEPENDENT_AMBULATORY_CARE_PROVIDER_SITE_OTHER): Payer: 59 | Admitting: Cardiovascular Disease

## 2021-11-17 ENCOUNTER — Encounter: Payer: Self-pay | Admitting: Cardiovascular Disease

## 2021-11-17 DIAGNOSIS — G4733 Obstructive sleep apnea (adult) (pediatric): Secondary | ICD-10-CM | POA: Diagnosis not present

## 2021-11-17 DIAGNOSIS — I10 Essential (primary) hypertension: Secondary | ICD-10-CM | POA: Diagnosis not present

## 2021-11-17 DIAGNOSIS — I255 Ischemic cardiomyopathy: Secondary | ICD-10-CM

## 2021-11-17 DIAGNOSIS — Z794 Long term (current) use of insulin: Secondary | ICD-10-CM

## 2021-11-17 DIAGNOSIS — E785 Hyperlipidemia, unspecified: Secondary | ICD-10-CM

## 2021-11-17 DIAGNOSIS — E119 Type 2 diabetes mellitus without complications: Secondary | ICD-10-CM

## 2021-11-17 DIAGNOSIS — Z79899 Other long term (current) drug therapy: Secondary | ICD-10-CM | POA: Diagnosis not present

## 2021-11-17 DIAGNOSIS — I2102 ST elevation (STEMI) myocardial infarction involving left anterior descending coronary artery: Secondary | ICD-10-CM

## 2021-11-17 DIAGNOSIS — E113512 Type 2 diabetes mellitus with proliferative diabetic retinopathy with macular edema, left eye: Secondary | ICD-10-CM | POA: Diagnosis not present

## 2021-11-17 MED ORDER — DAPAGLIFLOZIN PROPANEDIOL 10 MG PO TABS
10.0000 mg | ORAL_TABLET | Freq: Every day | ORAL | 11 refills | Status: DC
Start: 2021-11-17 — End: 2022-11-27
  Filled 2021-11-17: qty 30, 30d supply, fill #0
  Filled 2022-01-18: qty 30, 30d supply, fill #1
  Filled 2022-02-18: qty 30, 30d supply, fill #2
  Filled 2022-03-18: qty 30, 30d supply, fill #3
  Filled 2022-04-14: qty 30, 30d supply, fill #4
  Filled 2022-05-26: qty 30, 30d supply, fill #5
  Filled 2022-06-27: qty 30, 30d supply, fill #6
  Filled 2022-07-27: qty 30, 30d supply, fill #7
  Filled 2022-08-24: qty 30, 30d supply, fill #8
  Filled 2022-09-25: qty 30, 30d supply, fill #9
  Filled 2022-10-29 – 2022-10-30 (×2): qty 30, 30d supply, fill #10

## 2021-11-17 MED ORDER — SPIRONOLACTONE 25 MG PO TABS
12.5000 mg | ORAL_TABLET | Freq: Two times a day (BID) | ORAL | 11 refills | Status: DC
  Filled 2021-11-17 – 2021-11-24 (×3): qty 30, 30d supply, fill #0
  Filled 2021-12-22: qty 30, 30d supply, fill #1
  Filled 2022-01-18: qty 30, 30d supply, fill #2
  Filled 2022-02-18: qty 30, 30d supply, fill #3
  Filled 2022-03-24: qty 30, 30d supply, fill #4
  Filled 2022-05-01: qty 30, 30d supply, fill #5
  Filled 2022-06-02: qty 30, 30d supply, fill #6
  Filled 2022-07-01: qty 30, 30d supply, fill #7
  Filled 2022-07-27: qty 30, 30d supply, fill #8
  Filled 2022-08-24: qty 30, 30d supply, fill #9
  Filled 2022-10-02: qty 30, 30d supply, fill #10
  Filled 2022-10-29 – 2022-10-30 (×2): qty 30, 30d supply, fill #11

## 2021-11-17 MED ORDER — ENTRESTO 97-103 MG PO TABS
1.0000 | ORAL_TABLET | Freq: Two times a day (BID) | ORAL | 6 refills | Status: DC
  Filled 2021-11-17: qty 60, 30d supply, fill #0
  Filled 2021-12-15: qty 60, 30d supply, fill #1
  Filled 2022-01-11: qty 60, 30d supply, fill #2
  Filled 2022-02-13: qty 60, 30d supply, fill #3
  Filled 2022-03-18: qty 60, 30d supply, fill #4
  Filled 2022-04-14: qty 60, 30d supply, fill #5
  Filled 2022-05-22: qty 60, 30d supply, fill #6

## 2021-11-17 NOTE — Patient Instructions (Signed)
Medication Instructions:  INCREASE Farxiga to 10mg  daily   INCREASE spironolactone to 12.5mg  twice daily   *If you need a refill on your cardiac medications before your next appointment, please call your pharmacy*   Lab Work: NON-FASTING CMET in 2 weeks   If you have labs (blood work) drawn today and your tests are completely normal, you will receive your results only by: MyChart Message (if you have MyChart) OR A paper copy in the mail If you have any lab test that is abnormal or we need to change your treatment, we will call you to review the results.  Follow-Up: At [facility], you and your health needs are our priority.  As part of our continuing mission to provide you with exceptional heart care, we have created designated Provider Care Teams.  These Care Teams include your primary Cardiologist (physician) and Advanced Practice Providers (APPs -  Physician Assistants and Nurse Practitioners) who all work together to provide you with the care you need, when you need it.  We recommend signing up for the patient portal called "MyChart".  Sign up information is provided on this After Visit Summary.  MyChart is used to connect with patients for Virtual Visits (Telemedicine).  Patients are able to view lab/test results, encounter notes, upcoming appointments, etc.  Non-urgent messages can be sent to your provider as well.   To learn more about what you can do with MyChart, go to [facility].    Your next appointment:    6 weeks with [email or web address] -- DOD day

## 2021-11-17 NOTE — Progress Notes (Signed)
Cardiology Office Note    Date:  11/27/2021   ID:  [name] Merritt, DOB 01/06/1971, MRN [id number]  PCP:  [name] Carol, MD  Cardiologist:  [name] Majestic, MD   10 month F/u office visit following his STEMI in August 2021 and sleep evaluation following initiation of CPAP therapy.   History of Present Illness:  [name] Merritt is a 51 y.o. male who has a history of obesity, hypertension, hyperlipidemia, insulin-dependent diabetes mellitus, as well as diagnosed, but not treated sleep apnea.  On May 29, 2020 he experienced recurrent intermittent episodes of chest tightness for the majority of the day.  His symptoms ultimately resolved.  In the early morning at 5 AM on that evening he developed severe chest pain and presented to the emergency room where ECG showed acute anterolateral ST segment elevation.  A code STEMI was activated.  He was taken emergently to the catheterization laboratory and was found to have total occlusion of the mid LAD with TIMI 0 flow and absence of collaterals.  There was concomitant CAD with tortuous first diagonal vessel with 70% narrowing beyond a sharp bend, 20% narrowing beyond a sharp bend in the ramus immediate vessel, 50% focal OM 3 stenosis in a dominant left circumflex coronary artery and 70% focal stenosis in the midportion of a small PLA branch.  He had a normal small nondominant RCA.  He underwent successful PCI to the mid LAD with ultimate insertion of a Resolute Onyx 3.0 x 22 mm stent postdilated to 3.25 mm with restoration of TIMI-3 flow. I had not seen him since his initial acute catheterization and intervention.  An echo Doppler study on January 30, 2020 showed an EF of 35 to 40% with akinesis of the mid septum extending to the apex consistent with his LAD infarction.  He had grade 2 diastolic dysfunction.  He was initially placed on metoprolol which was switched to carvedilol.  He was started on Entresto and high intensity statin therapy.  He also was enrolled  in the AEGIS trial.  When he presented for his infusion as part of the AEGIS  Trial on June 08, 2020 he was noted to be in atrial flutter.  Earlier he had taken Sudafed for nasal congestion.  He was subsequently sent to the emergency room.  Carvedilol was switched back to metoprolol for improved rate control, he was started on Eliquis and Brilinta was changed to Plavix.  He underwent TEE DC cardioversion by Dr. Gilman Merritt on June 10, 2020 and post procedure he was discharged on Plavix/Eliquis.  He was seen by [name] Deforest, PA on July 01, 2020.  At that time, he was was stable and did not have any recurrent chest pain.  He was maintaining sinus rhythm.  With his echo Doppler showing EF of 35 to 40% Entresto was increased to 49/51 mg twice a day.  He was on insulin for diabetes mellitus.  Of note, prior to his initial MI, he had undergone a home sleep study on April 28, 2020 which demonstrated moderate overall sleep apnea with an AHI of 15.4 but he had significant oxygen desaturation to a nadir of 74%.  He spent 54.4 minutes of the test with saturation less than 89%.  Apparently there was never any follow-up of this and he never had been contacted by pulmonary for CPAP titration.    I last saw him January 28, 2021 at which time he denied any recurrent chest pain.  He was on Entresto 49/51 mg twice a day,  Farxiga 5 mg, metoprolol succinate 25 mg daily for his cardiomyopathy.  He is on Plavix and Eliquis for anticoagulation.  He is on atorvastatin 80 mg daily for hyperlipidemia.  He continues to be on Lantus insulin.  During his initial office evaluation with me, he was significantly hypertensive with stage II hypertension and I recommended further titration of Entresto to 97/103 mg twice a day.  I also added spironolactone 12.5 mg daily to his medical regimen particularly with his blood pressure elevation and LV dysfunction.  I reviewed his sleep study from July 2021 which confirmed at least moderate overall sleep  apnea but I was concerned with his significant oxygen desaturation to a nadir of 74% perhaps suggesting more severe sleep apnea during REM sleep.  Unfortunately, since 9 months had elapsed since his initial evaluation it was required that he undergo a new assessment and recommended he undergo a split-night sleep study such that CPAP titration could be done at the same time.  I recommended follow-up laboratory.  I recommended a follow-up echo Doppler study following further titration of Entresto.   Since I last saw him, he has felt improved with reference to not experiencing any anginal symptomatology.  His blood pressure has continued to be elevated despite taking Entresto 97/103 twice a day, Farxiga 5 mg daily, metoprolol succinate 25 mg daily and spironolactone 12.5 mg.  He is now on Repatha and atorvastatin 80 mg for hyperlipidemia.  His follow-up echo Doppler study on Mar 08, 2021 showed significant improvement in LV function now at 55 to 60% ejection fraction.  There were no wall motion abnormalities.  There was mild LVH and he had normal diastolic parameters.  There was mild aortic sclerosis without stenosis.  He underwent his split-night sleep evaluation on March 28, 2021.  This confirmed severe sleep apnea with an AHI of 35.5.  Events were very severe during REM sleep with an AHI of 91.4/h.  CPAP was initiated at 6 cm and was titrated to 14 cm water pressure AHI was 0 but this was without REM sleep.  Snoring persisted until 14 cm of water.  He initiated CPAP therapy on October 11, 2021 and received a ResMed air sense 11 AutoSet unit with [employer] as his DME company.  Initial download was obtained from October 18, 2021 through November 16, 2021 which showed extremely poor compliance with usage days and only 20%.  He essentially was not using therapy with average use at only 34 minutes.  His CPAP setting range was 12 to 18 cm and AHI was 1.0 with 95th percentile at 11.  However undoubtedly this data is  unreliable due to very poor sleep efficiency and certainly no REM sleep.  He presents to the office today for follow-up cardiology and sleep evaluation.   Past Medical History:  Diagnosis Date   Anxiety    CHF (congestive heart failure) ([place])    Coronary artery disease    Diabetes mellitus without complication ([place])    type II   GERD (gastroesophageal reflux disease)    not currently   High cholesterol    Hypertension    Myocardial infarction ([place]) 05/30/2020   New onset atrial flutter ([place]) 06/09/2020   Overweight    Sleep apnea     Past Surgical History:  Procedure Laterality Date   CARDIOVERSION N/A 06/10/2020   Procedure: CARDIOVERSION;  Surgeon: Donato Heinz, MD;  Location: [place];  Service: Cardiovascular;  Laterality: N/A;   CORONARY STENT INTERVENTION N/A 05/30/2020  Procedure: CORONARY STENT INTERVENTION;  Surgeon: Troy Sine, MD;  Location: [place] [facility];  Service: Cardiovascular;  Laterality: N/A;   CORONARY/GRAFT ACUTE MI REVASCULARIZATION N/A 05/30/2020   Procedure: Coronary/Graft Acute MI Revascularization;  Surgeon: Troy Sine, MD;  Location: [place] [facility];  Service: Cardiovascular;  Laterality: N/A;   GAS INSERTION Right 07/19/2021   Procedure: INSERTION OF GAS - C3F8;  Surgeon: Jalene Mullet, MD;  Location: [place];  Service: Ophthalmology;  Laterality: Right;   GAS/FLUID EXCHANGE Right 07/19/2021   Procedure: GAS/FLUID EXCHANGE;  Surgeon: Jalene Mullet, MD;  Location: [place];  Service: Ophthalmology;  Laterality: Right;   LEFT HEART CATH AND CORONARY ANGIOGRAPHY N/A 05/30/2020   Procedure: LEFT HEART CATH AND CORONARY ANGIOGRAPHY;  Surgeon: Troy Sine, MD;  Location: [place] [facility];  Service: Cardiovascular;  Laterality: N/A;   MEMBRANE PEEL Right 07/19/2021   Procedure: MEMBRANE PEEL;  Surgeon: Jalene Mullet, MD;  Location: [place];  Service: Ophthalmology;  Laterality: Right;   PARS PLANA VITRECTOMY 27 GAUGE Right 09/27/2021    Procedure: PARS PLANA VITRECTOMY 27 GAUGE;  Surgeon: Jalene Mullet, MD;  Location: [place];  Service: Ophthalmology;  Laterality: Right;   PHOTOCOAGULATION WITH LASER Right 07/19/2021   Procedure: PHOTOCOAGULATION WITH LASER;  Surgeon: Jalene Mullet, MD;  Location: [place];  Service: Ophthalmology;  Laterality: Right;   PHOTOCOAGULATION WITH LASER Right 09/27/2021   Procedure: PHOTOCOAGULATION WITH LASER;  Surgeon: Jalene Mullet, MD;  Location: [place];  Service: Ophthalmology;  Laterality: Right;   REPAIR OF COMPLEX TRACTION RETINAL DETACHMENT Right 07/19/2021   Procedure: REPAIR OF COMPLEX TRACTION RETINAL DETACHMENT;  Surgeon: Jalene Mullet, MD;  Location: [place];  Service: Ophthalmology;  Laterality: Right;   TEE WITHOUT CARDIOVERSION N/A 06/10/2020   Procedure: TRANSESOPHAGEAL ECHOCARDIOGRAM (TEE);  Surgeon: Donato Heinz, MD;  Location[facility];  Service: Cardiovascular;  Laterality: N/A;   torn rotator cuff  2018   2018    Current Medications: Outpatient Medications Prior to Visit  Medication Sig Dispense Refill   acetaminophen (TYLENOL) 650 MG CR tablet Take 1,300 mg by mouth every 8 (eight) hours as needed for pain.     albuterol (VENTOLIN HFA) 108 (90 Base) MCG/ACT inhaler Inhale 2 puffs into the lungs every 6 (six) hours as needed for wheezing or shortness of breath.     ALPRAZolam (XANAX) 0.25 MG tablet Take 1 tablet (0.25 mg total) by mouth daily as needed. 20 tablet 0   apixaban (ELIQUIS) 5 MG TABS tablet TAKE 1 TABLET BY MOUTH TWO TIMES DAILY 180 tablet 1   Ascorbic Acid (VITAMIN C) 1000 MG tablet Take 1,000 mg by mouth daily.     atorvastatin (LIPITOR) 80 MG tablet Take 1 tablet (80 mg total) by mouth daily at 6 PM. 90 tablet 3   cetirizine (ZYRTEC) 10 MG tablet TAKE 1 TABLET(10 MG) BY MOUTH DAILY 30 tablet 5   chlorpheniramine-HYDROcodone (TUSSIONEX) 10-8 MG/5ML SUER Take 5 mLs by mouth every 12 (twelve) hours as needed for 10 days 100 mL 0   CINNAMON PO Take  1,500 mg by mouth 2 (two) times daily.     clopidogrel (PLAVIX) 75 MG tablet Take 1 tablet (75 mg total) by mouth daily. 90 tablet 2   Evolocumab (REPATHA SURECLICK) 591 MG/ML SOAJ Inject 1 Dose into the skin every 14 (fourteen) days. 2 mL 11   fluticasone (FLONASE) 50 MCG/ACT nasal spray SHAKE LIQUID AND USE 1 SPRAY IN EACH NOSTRIL DAILY (Patient taking differently:  Place 2 sprays into both nostrils daily as needed for allergies or rhinitis.) 16 g 2   insulin glargine-yfgn (SEMGLEE, YFGN,) 100 UNIT/ML Pen Inject 55 Units into the skin daily or as directed. (Patient taking differently: Inject 65 Units into the skin every evening. Only takes if CBG is greater than 140- this is his idea.) 45 mL 3   magnesium citrate SOLN Take 1 Bottle by mouth once as needed for mild constipation or moderate constipation.      Melatonin 10 MG TABS Take 10 mg by mouth at bedtime as needed (sleep).     metFORMIN (GLUCOPHAGE) 1000 MG tablet Take 1/2 tablet by mouth daily for 1 week then increase to 1 tab daily 90 tablet 3   metoprolol succinate (TOPROL-XL) 25 MG 24 hr tablet Take 1 tablet (25 mg total) by mouth daily. 90 tablet 2   nitroGLYCERIN (NITROSTAT) 0.4 MG SL tablet Place 1 tablet (0.4 mg total) under the tongue every 5 (five) minutes as needed for chest pain. 25 tablet 3   ofloxacin (OCUFLOX) 0.3 % ophthalmic solution Place 1 drop into the right eye 4 (four) times daily for 1 week 5 mL 0   pantoprazole (PROTONIX) 40 MG tablet Take 1 tablet (40 mg total) by mouth 2 (two) times daily. 180 tablet 3   Polyethyl Glycol-Propyl Glycol (SYSTANE ULTRA PF OP) Place 1 drop into both eyes daily as needed (dry eyes).     sacubitril-valsartan (ENTRESTO) 97-103 MG Take 1 tablet by mouth 2 (two) times daily. 60 tablet 6   sildenafil (REVATIO) 20 MG tablet Take 20 mg by mouth daily as needed for erectile dysfunction.     zinc gluconate 50 MG tablet Take 50 mg by mouth daily.     dapagliflozin propanediol (FARXIGA) 5 MG TABS  tablet Take 1 tablet (5 mg total) by mouth daily. 90 tablet 3   spironolactone (ALDACTONE) 25 MG tablet Take 0.5 tablets (12.5 mg total) by mouth daily. 60 tablet 1   No facility-administered medications prior to visit.     Allergies:   Pineapple, Tomato, and Rosuvastatin calcium   Social History   Socioeconomic History   Marital status: Married    Spouse name: Not on file   Number of children: Not on file   Years of education: Not on file   Highest education level: Not on file  Occupational History   Occupation: maintenance  Tobacco Use   Smoking status: Never   Smokeless tobacco: Former    Types: Chew    Quit date: 2017  Vaping Use   Vaping Use: Never used  Substance and Sexual Activity   Alcohol use: Not Currently   Drug use: Not Currently    Types: Marijuana    Comment: 2006- last time   Sexual activity: Not on file  Other Topics Concern   Not on file  Social History Narrative   Not on file   Social Determinants of Health   Financial Resource Strain: Not on file  Food Insecurity: Not on file  Transportation Needs: Not on file  Physical Activity: Not on file  Stress: Not on file  Social Connections: Not on file    Socially, he is married most recently for 2 years.  He has 6 boys and a daughter.  He previously was working as a Therapist, occupational.  Family History:  The patient's family history includes Diabetes in his father, maternal grandfather, maternal grandmother, mother, paternal grandfather, and paternal grandmother; Hypertension in his father, maternal grandfather,  maternal grandmother, mother, paternal grandfather, and paternal grandmother.   His father is 85 and has dementia and had a history of MI.  Mother is 74 and has history of MI.  ROS General: Negative; No fevers, chills, or night sweats;  HEENT: Negative; No changes in vision or hearing, sinus congestion, difficulty swallowing Pulmonary: Negative; No cough, wheezing, shortness of breath,  hemoptysis Cardiovascular: Negative; No chest pain, presyncope, syncope, palpitations GI: Negative; No nausea, vomiting, diarrhea, or abdominal pain GU: Negative; No dysuria, hematuria, or difficulty voiding Musculoskeletal: Negative; no myalgias, joint pain, or weakness Hematologic/Oncology: Negative; no easy bruising, bleeding Endocrine: Negative; no heat/cold intolerance; no diabetes Neuro: Negative; no changes in balance, headaches Skin: Negative; No rashes or skin lesions Psychiatric: Negative; No behavioral problems, depression Sleep: Positive for snoring, OSA on home sleep study; no bruxism, restless legs, hypnogognic hallucinations, no cataplexy Other comprehensive 14 point system review is negative.   PHYSICAL EXAM:   VS:  BP (!) 168/86    Pulse 65    Ht _0  (1.854 m)    Wt (!) 328 lb 12.8 oz (149.1 kg)    SpO2 98%    BMI 43.38 kg/m     Repeat blood pressure by me continues to be elevated at 160/84.  Wt Readings from Last 3 Encounters:  11/17/21 (!) 328 lb 12.8 oz (149.1 kg)  09/27/21 (!) 328 lb (148.8 kg)  07/19/21 (!) 326 lb (147.9 kg)    General: Alert, oriented, no distress.  Bearded, morbidly obese Skin: normal turgor, no rashes, warm and dry HEENT: Normocephalic, atraumatic. Pupils equal round and reactive to light; sclera anicteric; extraocular muscles intact;  Nose without nasal septal hypertrophy Mouth/Parynx benign; Mallinpatti scale 3/4 Neck: Thick neck; no JVD, no carotid bruits; normal carotid upstroke Lungs: clear to ausculatation and percussion; no wheezing or rales Chest wall: without tenderness to palpitation Heart: PMI not displaced, RRR, s1 s2 normal, 1/6 systolic murmur, no diastolic murmur, no rubs, gallops, thrills, or heaves Abdomen: Central adiposity; soft, nontender; no hepatosplenomehaly, BS+; abdominal aorta nontender and not dilated by palpation. Back: no CVA tenderness Pulses 2+ Musculoskeletal: full range of motion, normal strength, no  joint deformities Extremities: no clubbing cyanosis or edema, Homan's sign negative  Neurologic: grossly nonfocal; Cranial nerves grossly wnl Psychologic: Normal mood and affect   Studies/Labs Reviewed:   November 17, 2021 ECG (independently read by me): NSR at 65, Inferior  and anteriro Q waves ; no ectopy, normal intervals  January 28, 2021 ECG (independently read by me): Sinus bradycardia at 55; QS anteriorly c/w old Ant MI;  Recent Labs: BMP Latest Ref Rng & Units 09/27/2021 09/27/2021 07/19/2021  Glucose 70 - 99 mg/dL 105(H) 112(H) 103(H)  BUN 6 - 20 mg/dL _1 Creatinine 0.61 - 1.24 mg/dL 0.90 0.97 0.95  BUN/Creat Ratio 9 - 20 - - -  Sodium 135 - 145 mmol/L 138 134(L) 138  Potassium 3.5 - 5.1 mmol/L 4.4 6.1(H) 4.2  Chloride 98 - 111 mmol/L 101 105 107  CO2 22 - 32 mmol/L - 23 23  Calcium 8.9 - 10.3 mg/dL - 8.8(L) 9.0     Hepatic Function Latest Ref Rng & Units 01/28/2021 07/29/2020 05/30/2020  Total Protein 6.0 - 8.5 g/dL 7.0 6.9 8.0  Albumin 4.0 - 5.0 g/dL 4.2 4.3 3.7  AST 0 - 40 IU/L 17 15 74(H)  ALT 0 - 44 IU/L _2 Alk Phosphatase 44 - 121 IU/L 83 77 62  Total Bilirubin 0.0 -  1.2 mg/dL 0.5 0.6 1.1  Bilirubin, Direct 0.00 - 0.40 mg/dL - 0.18 -    CBC Latest Ref Rng & Units 09/27/2021 07/19/2021 01/28/2021  WBC 4.0 - 10.5 K/uL - 4.2 4.8  Hemoglobin 13.0 - 17.0 g/dL 15.6 14.2 14.2  Hematocrit 39.0 - 52.0 % 46.0 44.3 42.2  Platelets 150 - 400 K/uL - 248 259   Lab Results  Component Value Date   MCV 96.9 07/19/2021   MCV 90 01/28/2021   MCV 92 07/01/2020   Lab Results  Component Value Date   TSH 2.440 01/28/2021   Lab Results  Component Value Date   HGBA1C 6.7 (H) 01/28/2021     BNP No results found for: BNP  ProBNP    Component Value Date/Time   PROBNP 267 (H) 01/28/2021 1000     Lipid Panel     Component Value Date/Time   CHOL 179 01/28/2021 1000   TRIG 52 01/28/2021 1000   HDL 45 01/28/2021 1000   CHOLHDL 4.0 01/28/2021 1000   CHOLHDL 5.1  05/30/2020 0952   VLDL 15 05/30/2020 0952   LDLCALC 124 (H) 01/28/2021 1000   LABVLDL 10 01/28/2021 1000     RADIOLOGY: No results found.   Additional studies/ records that were reviewed today include:   EMERGENT CATH/PCI: 05/30/2020 3rd Mrg lesion is 50% stenosed. LPDA lesion is 70% stenosed. LPAV-1 lesion is 30% stenosed. LPAV-2 lesion is 30% stenosed. 1st Diag lesion is 70% stenosed. Ramus lesion is 20% stenosed. Mid LAD lesion is 100% stenosed. Post intervention, there is a 0% residual stenosis. A stent was successfully placed.   Acute anterolateral ST segment elevation myocardial infarction secondary to total occlusion of the mid LAD with TIMI 0 flow and absence of collaterals.   Concomitant CAD with tortuous 1st diagonal vessel with 70% narrowing beyond a sharp bend in the vessel, 20% narrowing beyond a sharp bend in the ramus immediate vessel, and dominant left circumflex coronary artery with 50% focal OM 3 stenosis, distal 30% stenoses in the AV groove, and focal 70% stenosis in the midportion of a small PLA branch.  Normal small nondominant RCA.   LVEDP 12 millimeters Hg.    Successful PCI to the totally occluded mid LAD with ultimate insertion of a Resolute Onyx 3.0 x 22 mm DES stent postdilated to 3.25 mm with digital narrowing of 0% and restoration of TIMI-3 flow.   RECOMMENDATION: DAPT for minimum of 1 year.  Aggressive lipid-lowering therapy with titration of atorvastatin to 80 mg daily.  We will reinitiate ARB therapy with losartan initially at lower dose, add low-dose beta-blocker therapy, and obtain 2D echo Doppler study in a.m. optimal blood pressure control with target blood pressure less than 130/80 and ideal blood pressure less than 120/80.  We will diabetes control.  If patient has LV dysfunction, SGLT 2 inhibition can be considered.   ECHO 05/31/2020 IMPRESSIONS   1. Akinesis of the mid septum into the apex consistent with LAD  infarction. Contrast imaging  shows no evidence of LV thrombus. Left  ventricular ejection fraction, by estimation, is 35 to 40%. The left  ventricle has moderately decreased function. The  left ventricle demonstrates regional wall motion abnormalities (see  scoring diagram/findings for description). Left ventricular diastolic  parameters are consistent with Grade II diastolic dysfunction  (pseudonormalization). Elevated left atrial pressure.   2. Right ventricular systolic function is normal. The right ventricular  size is normal. There is normal pulmonary artery systolic pressure. The  estimated right ventricular  systolic pressure is 44.3 mmHg.   3. The mitral valve is grossly normal. Trivial mitral valve  regurgitation. No evidence of mitral stenosis.   4. The aortic valve is tricuspid. Aortic valve regurgitation is not  visualized. Mild aortic valve sclerosis is present, with no evidence of  aortic valve stenosis.   5. The inferior vena cava is normal in size with greater than 50%  respiratory variability, suggesting right atrial pressure of 3 mmHg.   Conclusion(s)/Recommendation(s): Findings consistent with ischemic  cardiomyopathy.    TEE 06/10/2020 IMPRESSIONS   1. Left ventricular ejection fraction, by estimation, is 40 to 45%. The  left ventricle has mildly decreased function. The left ventricle  demonstrates regional wall motion abnormalities. Apical hypokinesis.   2. Right ventricular systolic function is normal. The right ventricular  size is normal.   3. The mitral valve is normal in structure. Trivial mitral valve  regurgitation.   4. The aortic valve is tricuspid. Aortic valve regurgitation is not  visualized. No aortic stenosis is present.   5. No left atrial/left atrial appendage thrombus was detected.   03/06/2021 IMPRESSION 1. Apical hypokinesis with overall preserved LV systolic function.   2. Left ventricular ejection fraction, by estimation, is 55 to 60%. The  left ventricle has normal  function. The left ventricle has no regional  wall motion abnormalities. There is mild left ventricular hypertrophy.  Left ventricular diastolic parameters  were normal.   3. Right ventricular systolic function is normal. The right ventricular  size is normal. There is normal pulmonary artery systolic pressure.   4. The mitral valve is normal in structure. No evidence of mitral valve  regurgitation. No evidence of mitral stenosis.   5. The aortic valve is tricuspid. Aortic valve regurgitation is not  visualized. Mild aortic valve sclerosis is present, with no evidence of  aortic valve stenosis.   6. The inferior vena cava is normal in size with greater than 50%  respiratory variability, suggesting right atrial pressure of 3 mmHg.     03/28/2021 CLINICAL INFORMATION Sleep Study Type: Split Night CPAP   Indication for sleep study: Congestive Heart Failure, Diabetes, Hypertension, Obesity, Re-Evaluation, Snoring, Witnessed Apneas   Epworth Sleepiness Score: 12   SLEEP STUDY TECHNIQUE As per the AASM Manual for the Scoring of Sleep and Associated Events v2.3 (April 2016) with a hypopnea requiring 4% desaturations.   The channels recorded and monitored were frontal, central and occipital EEG, electrooculogram (EOG), submentalis EMG (chin), nasal and oral airflow, thoracic and abdominal wall motion, anterior tibialis EMG, snore microphone, electrocardiogram, and pulse oximetry. Continuous positive airway pressure (CPAP) was initiated when the patient met split night criteria and was titrated according to treat sleep-disordered breathing.   MEDICATIONS acetaminophen (TYLENOL) 650 MG CR tablet albuterol (VENTOLIN HFA) 108 (90 Base) MCG/ACT inhaler ALPRAZolam (XANAX) 0.25 MG tablet apixaban (ELIQUIS) 5 MG TABS tablet atorvastatin (LIPITOR) 80 MG tablet cetirizine (ZYRTEC) 10 MG tablet clopidogrel (PLAVIX) 75 MG tablet dapagliflozin propanediol (FARXIGA) 5 MG TABS table Evolocumab (REPATHA  SURECLICK) 154 MG/ML SOAJ fluticasone (FLONASE) 50 MCG/ACT nasal spray ibuprofen (ADVIL) 800 MG tablet Insulin Glargine (BASAGLAR KWIKPEN) 100 UNIT/ML Insulin Glargine-yfgn (SEMGLEE, YFGN,) 100 UNIT/ML SOPN LANTUS SOLOSTAR 100 UNIT/ML Solostar Pen magnesium citrate SOLN metoprolol succinate (TOPROL-XL) 25 MG 24 hr tablet nitroGLYCERIN (NITROSTAT) 0.4 MG SL tablet pantoprazole (PROTONIX) 40 MG tablet sacubitril-valsartan (ENTRESTO) 97-103 MG spironolactone (ALDACTONE) 25 MG tablet terbinafine (LAMISIL) 250 MG tablet   Medications self-administered by patient taken the night of the study :  PANTOPRAZOLE SODIUM, ELIQUIS, ENTRESTO, TYLENOL PM, INSULIN GLARGINE   RESPIRATORY PARAMETERS Diagnostic Total AHI (/hr):            35.5     RDI (/hr):         38.7     OA Index (/hr):            0.4       CA Index (/hr):      3.2 REM AHI (/hr):            91.4     NREM AHI (/hr):          31.3     Supine AHI (/hr):         36.5     Non-supine AHI (/hr):        33.6 Min O2 Sat (%):          79.0     Mean O2 (%):  91.7     Time below 88% (min):           8           Titration Optimal Pressure (cm):           14        AHI at Optimal Pressure (/hr):            0          Min O2 at Optimal Pressure (%):       93.0 Supine % at Optimal (%):       0          Sleep % at Optimal (%):         98           SLEEP ARCHITECTURE The recording time for the entire night was 446.9 minutes.   During a baseline period of 167.4 minutes, the patient slept for 150.5 minutes in REM and nonREM, yielding a sleep efficiency of 89.9%%. Sleep onset after lights out was 2.5 minutes with a REM latency of 73.0 minutes. The patient spent 5.6%% of the night in stage N1 sleep, 87.4%% in stage N2 sleep, 0.0%% in stage N3 and 7% in REM.   During the titration period of 273.3 minutes, the patient slept for 226.5 minutes in REM and nonREM, yielding a sleep efficiency of 82.9%%. Sleep onset after CPAP initiation was 16.4 minutes with a REM  latency of 41.0 minutes. The patient spent 5.1%% of the night in stage N1 sleep, 79.0%% in stage N2 sleep, 0.0%% in stage N3 and 15.9% in REM.   CARDIAC DATA The 2 lead EKG demonstrated sinus rhythm. The mean heart rate was 100.0 beats per minute. Other EKG findings include: None.   LEG MOVEMENT DATA The total Periodic Limb Movements of Sleep (PLMS) were 0. The PLMS index was 0.0 .   IMPRESSIONS - Severe obstructive sleep apnea occurred during the diagnostic portion of the study (AHI  35.5/h; RDI 38.7/h); events were very severe during REM sleep (AHI 91.4/h).  CPAP was initiated at 6 cm and was titrated to optimal PAP pressure (14 cm of water). AHI 0 at 14 cm without REM sleep, and 0.8/h at 12 cm with minimal REM sleep. Snoring persisted until 14 cm of water. - No significant central sleep apnea occurred during the diagnostic portion of the study (CAI 3.2/hour). - Mild oxygen desaturation was noted during the diagnostic portion of the study to a nadir of 79.0%. - The patient snored with loud snoring volume during  the diagnostic portion of the study. - No cardiac abnormalities were noted during this study. - Clinically significant periodic limb movements did not occur during sleep.   DIAGNOSIS - Obstructive Sleep Apnea (G47.33)   RECOMMENDATIONS - Recommend an initial trial of CPAP Auto therapy with EPR of 3 at 12 - 18 cm of water with heated humidification. A medium Wide Cushion size [employer] Respironics Minimal Museum/gallery curator Under Nose Frame (M) mask was used for the titration. - Effort should be made to optimize nasal and oropharyngeal patency. - Avoid alcohol, sedatives and other CNS depressants that may worsen sleep apnea and disrupt normal sleep architecture. - Sleep hygiene should be reviewed to assess factors that may improve sleep quality. - Weight management (BMI 42) and regular exercise should be initiated or continued. - Recommend a download in 30 days and sleep clinic  evaluation after 4 weeks of therapy.  ASSESSMENT:    1. ST elevation myocardial infarction involving left anterior descending (LAD) coronary artery ([place]): 05/30/2020: DES stent to LAD   2. Ischemic cardiomyopathy: Resolved on most recent echo May 2022   3. OSA (obstructive sleep apnea)   4. Essential hypertension   5. Hyperlipidemia with target LDL less than 70   6. Controlled type 2 diabetes mellitus without complication, with long-term current use of insulin ([place])   7. Morbid obesity ([place])   8. Medication management     PLAN:  Mr. Doyl Bitting is a 51 year old gentleman who has a history of morbid obesity, hypertension, hyperlipidemia, and untreated obstructive sleep apnea.  He developed severe chest pain and was awakened from sleep the morning May 30, 2020 and was found to have acute anterolateral STEMI secondary to total mid occlusion of his LAD.  He was found to have concomitant CAD and his LAD was successfully stented with a 3.0 x 22 mm Resolute Onyx DES stent postdilated to 3.25 mm with 100% occlusion being reduced to 0% and restoration of TIMI-3 flow.  He has LV dysfunction secondary to his LAD infarction with initial EF at 35 to 40% which subsequently improved several weeks later to 40 to 45%.  He had developed an episode of atrial flutter following Sudafed and ultimately was converted successfully back to sinus rhythm with DC cardioversion.  At his office visit April 2022 he had stage II hypertension and I further titrated Entresto to 97/103 mg twice a day and added spironolactone 12.5 mg.  A subsequent echo Doppler study of May 2022 has now demonstrated normalization of LV function with EF at 55 to 60% with mild LVH and mild aortic sclerosis.  Presently, his breathing has improved.  His blood pressure continues to be elevated and a repeat by me was 160/84.  I have recommended further titration of spironolactone to 12.5 mg twice a day and also have recommended further titration of  Farxiga to 10 mg daily.  He will continue being on metoprolol 25 mg, Entresto 97/103 mg twice a day.  He continues to be on Plavix/Eliquis for antiplatelet therapy/anticoagulation.  He is now on Repatha and atorvastatin 80 mg daily for his significant hyperlipidemia and most recent LDL cholesterol in November 2022 was 66. With reference to his sleep apnea, he required a new sleep evaluation for reassessment and this confirmed severe sleep apnea with an AHI of 35.5.  However events were very severe during REM sleep with AHI of 91.4 and he had significant oxygen desaturation to 79%.  Due to supply chain issues, he recently received his new ResMed  air sense 11 CPAP unit on October 11, 2021.  I had a lengthy discussion with him today in the office regarding the importance of meeting compliance standards in the fact that if he is not compliant within 90 days from set up date his DME company will most likely force him to relinquish his machine.  I I discussed the adverse consequences of untreated sleep apnea with reference to his cardiovascular health and specifically discussed its effects on hypertension, nocturnal arrhythmias, increased risk for atrial fibrillation, inflammation, increased insulin resistance, GERD, and potential for severe nocturnal hypoxemia contributing to nocturnal ischemia particularly since he had previously suffered a myocardial infarction.  He has poor sleep hygiene.  He typically goes to bed between 2 and 3 AM and wakes up between 7 and 8 AM.  I discussed optimal sleep duration for an adult at 7 and 9 hours.  I will change his mask to a ResMed AirFit F30i mask which hopefully he will be able to tolerate rather than the full facemask which covers his nose which he states seems to move when he turns and he takes it off.  I discussed the importance of weight loss with his morbid obesity and the importance of exercise.  I will see him in 6 weeks for reevaluation or sooner as needed.     Medication Adjustments/Labs and Tests Ordered: Current medicines are reviewed at length with the patient today.  Concerns regarding medicines are outlined above.  Medication changes, Labs and Tests ordered today are listed in the Patient Instructions below. Patient Instructions  Medication Instructions:  INCREASE Farxiga to 69m daily   INCREASE spironolactone to 12.512mtwice daily   *If you need a refill on your cardiac medications before your next appointment, please call your pharmacy*   Lab Work: NON-FASTING CMET in 2 weeks   If you have labs (blood work) drawn today and your tests are completely normal, you will receive your results only by: [facility] you have MyChart) OR A paper copy in the mail If you have any lab test that is abnormal or we need to change your treatment, we will call you to review the results.  Follow-Up: [facility] needs are our [facility].  As part of our continuing mission to provide you with exceptional heart care, we have created designated Provider Care Teams.  These Care Teams include your primary Cardiologist (physician) and Advanced Practice Providers (APPs -  Physician Assistants and Nurse Practitioners) who all work together to provide you with the care you need, when you need it.  We recommend signing up for the patient portal called "MyChart".  Sign up information is provided on this After Visit Summary.  MyChart is used to connect with patients for Virtual Visits (Telemedicine).  Patients are able to view lab/test results, encounter notes, upcoming appointments, etc.  Non-urgent messages can be sent to your provider as well.   To learn more about what you can do with MyChart, go to [email or web address]   Your next appointment:    6 weeks with Dr. KeClaiborne Billings- DOD day    Signed, ThShelva MajesticMD  11/27/2021 11:40 AM    [id number]   [id number]: [phone]

## 2021-11-18 ENCOUNTER — Other Ambulatory Visit (HOSPITAL_COMMUNITY): Payer: Self-pay

## 2021-11-24 ENCOUNTER — Other Ambulatory Visit (HOSPITAL_COMMUNITY): Payer: Self-pay

## 2021-11-25 IMAGING — DX DG CHEST 1V PORT
1 series · 1 of 1 positions shown · non-contrast
Comparison: 05/30/2020

CLINICAL DATA: Tachycardia

EXAM:
PORTABLE CHEST 1 VIEW

[chest]
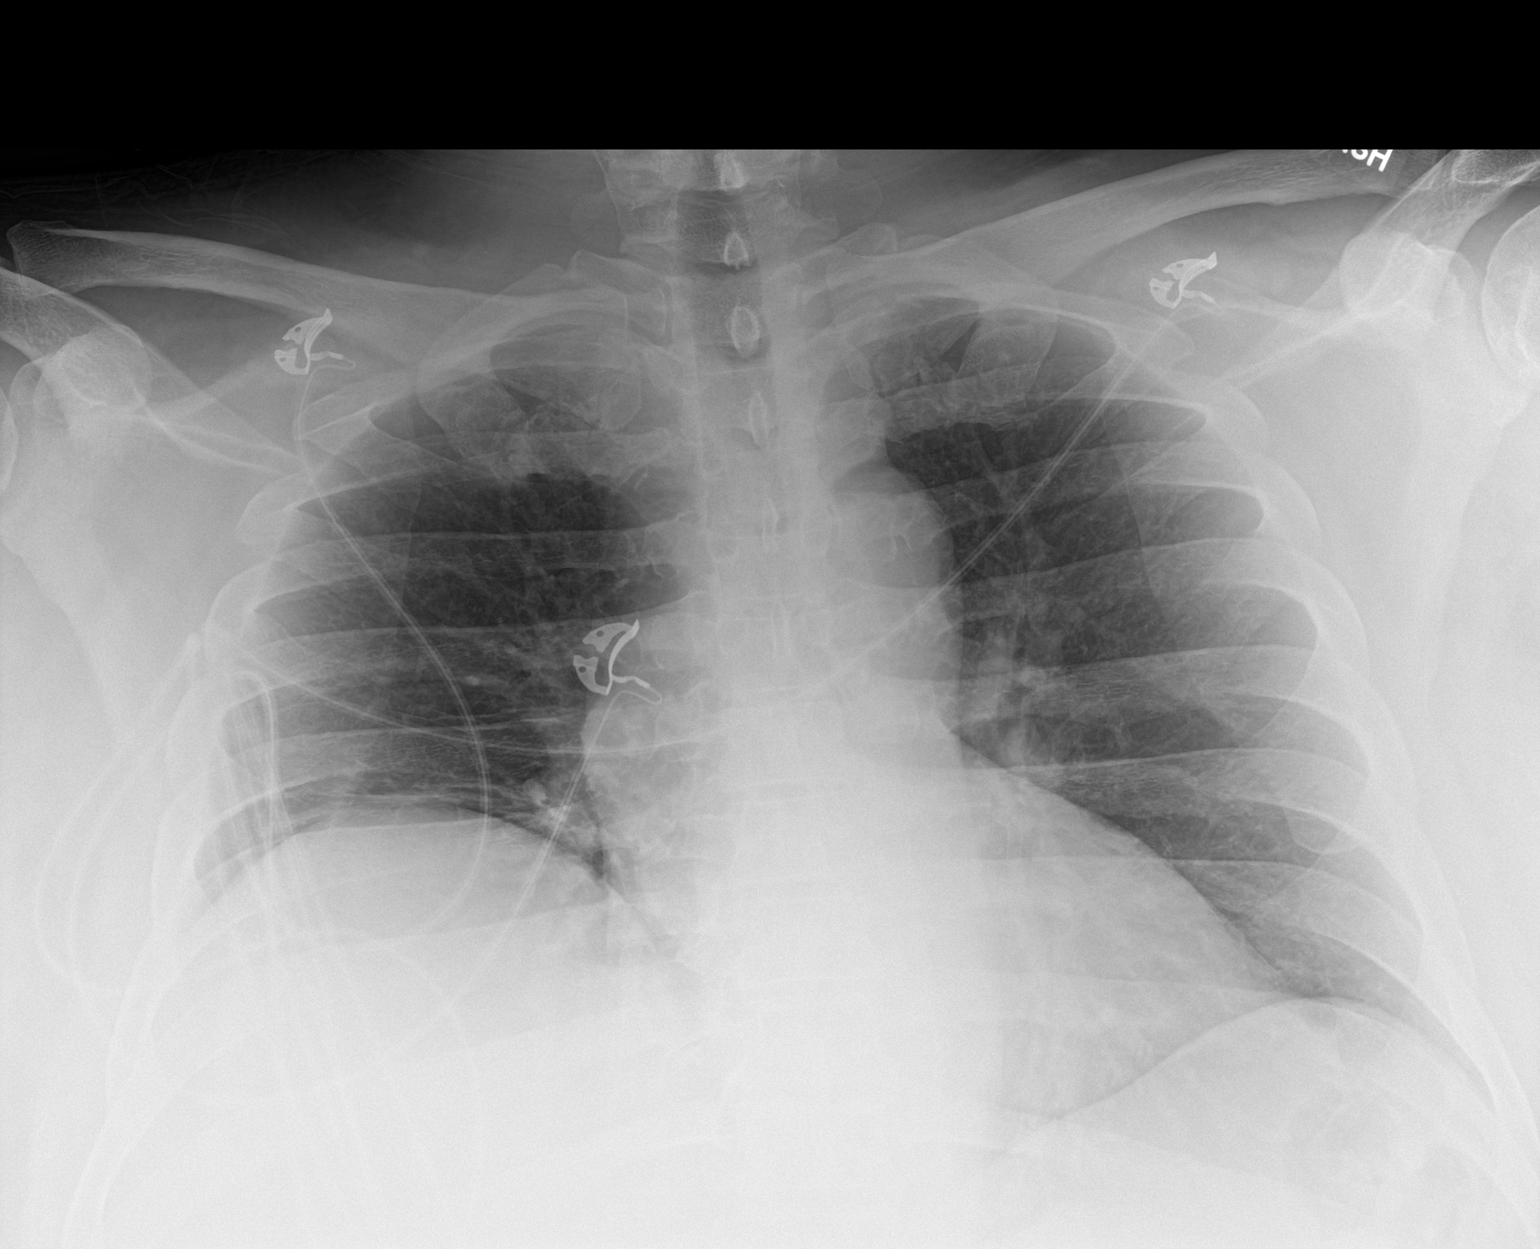

[1 of 1 positions shown; findings below may reference images not displayed]

FINDINGS: The heart size and mediastinal contours are within normal limits.
Chronic elevation of the right hemidiaphragm with right basilar
atelectasis. Left lung is clear. No pleural effusion or
pneumothorax. The visualized skeletal structures are unremarkable.
IMPRESSION: Chronic elevation of the right hemidiaphragm with right basilar
atelectasis. No acute cardiopulmonary process.

## 2021-11-27 ENCOUNTER — Encounter: Payer: Self-pay | Admitting: Cardiovascular Disease

## 2021-11-27 IMAGING — CT CT CHEST W/ CM
2 of 5 series · 13 of 36 positions shown, 16 images · IV contrast (omnipaque)
Comparison: 06/08/2020, 01/01/2020

CLINICAL DATA: Persistent cough, atrial flutter, leukocytosis

EXAM:
CT CHEST, ABDOMEN, AND PELVIS WITH CONTRAST
TECHNIQUE: Multidetector CT imaging of the chest, abdomen and pelvis was
performed following the standard protocol during bolus
administration of intravenous contrast.
CONTRAST:  100mL OMNIPAQUE IOHEXOL 300 MG/ML  SOLN

[Series 3: cap 5.0 i31f 2 · axial · 0.98mm/px · z∈[+749,+1334]mm · 10 of 144 slices shown, 13 images]
[im 14/144  mediastinal]
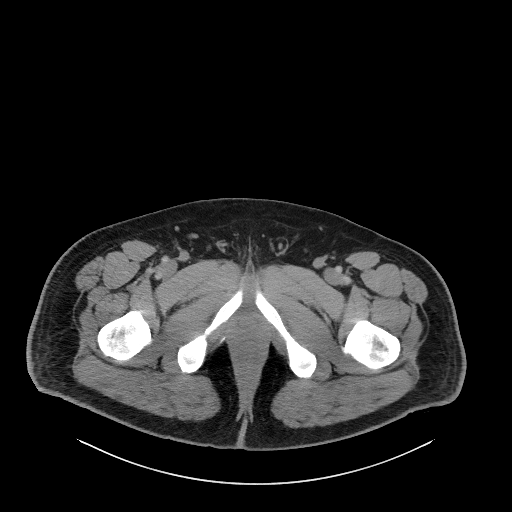
[im 14/144  lung]
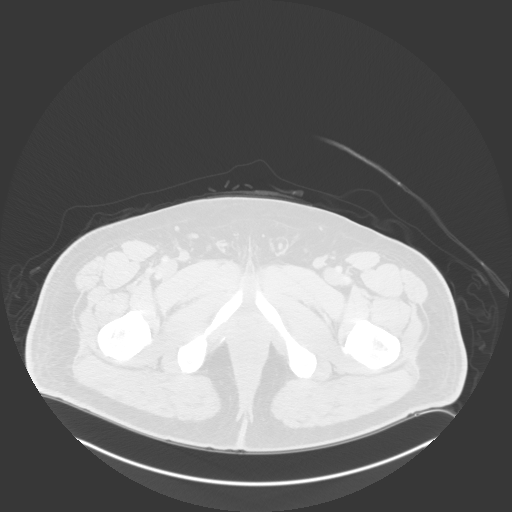
[im 27/144  lung]
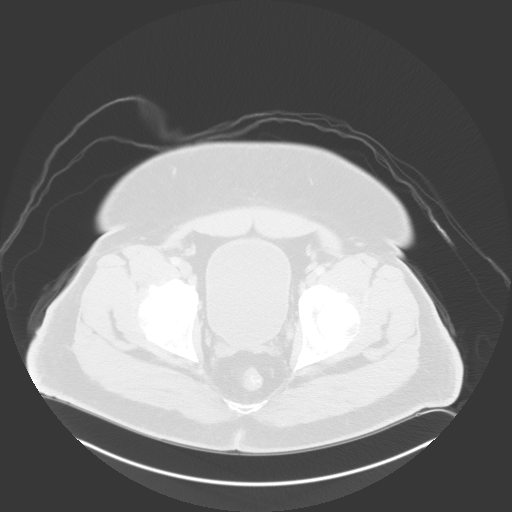
[im 40/144  lung]
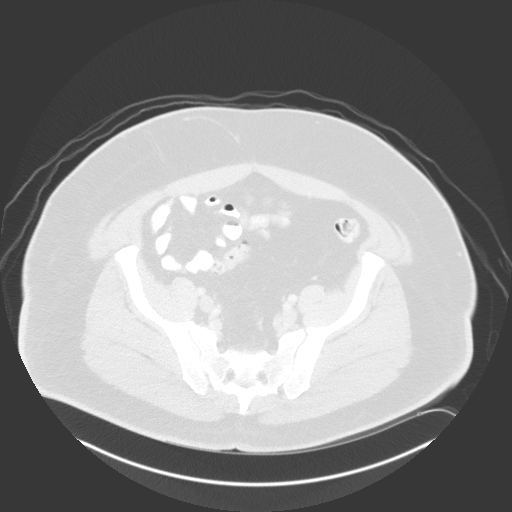
[im 53/144  lung]
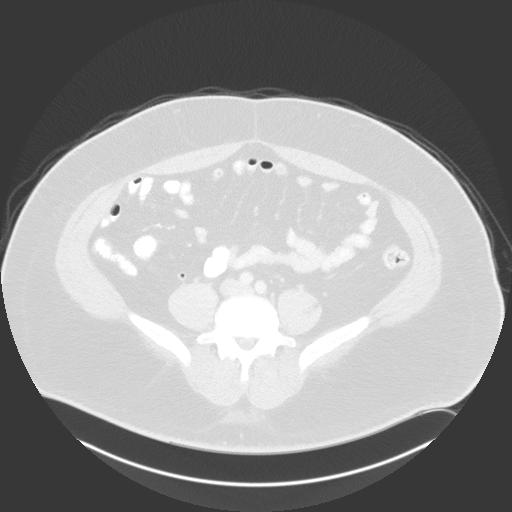
[im 66/144  mediastinal]
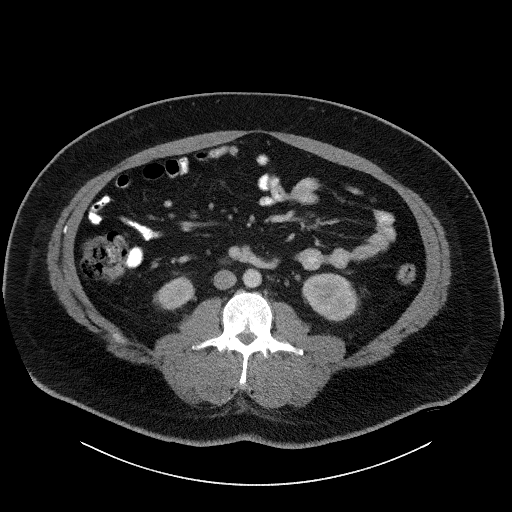
[im 66/144  lung]
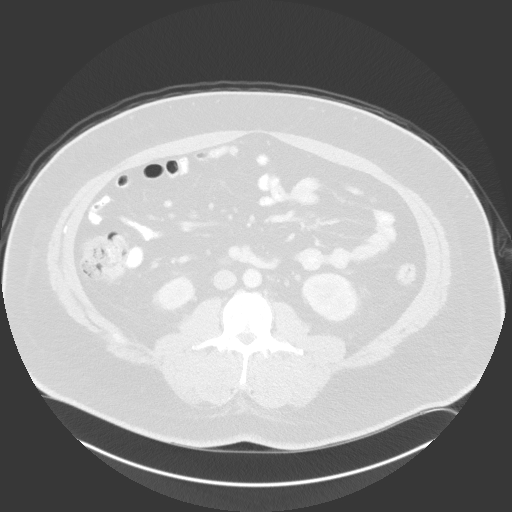
[im 79/144  lung]
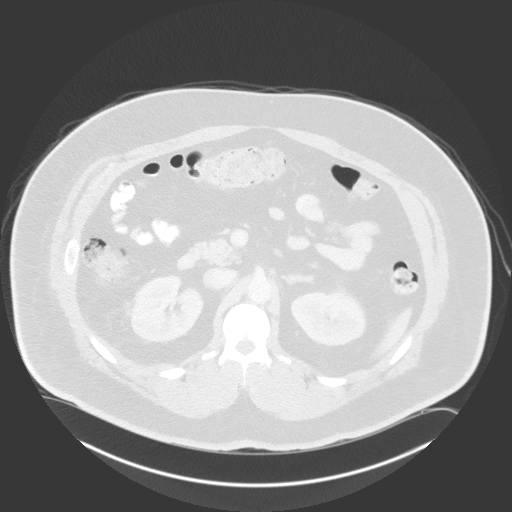
[im 92/144  lung]
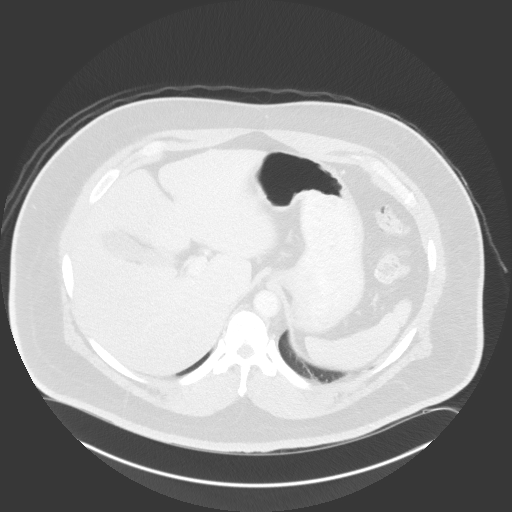
[im 105/144  lung]
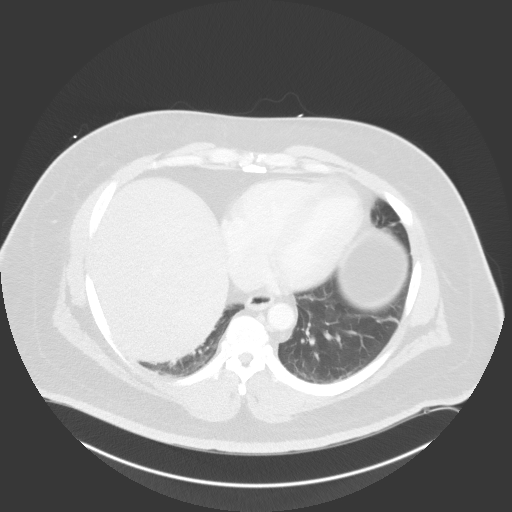
[im 118/144  mediastinal]
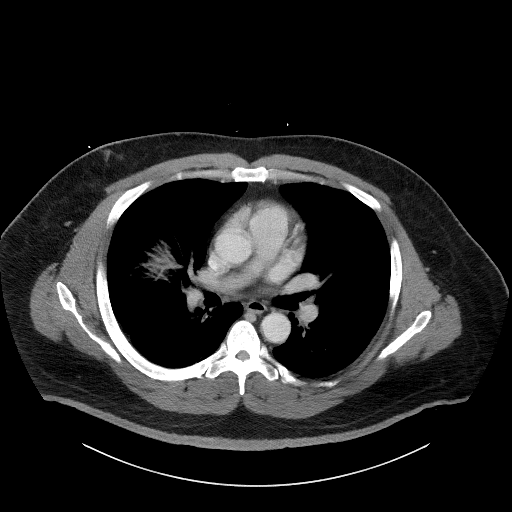
[im 118/144  lung]
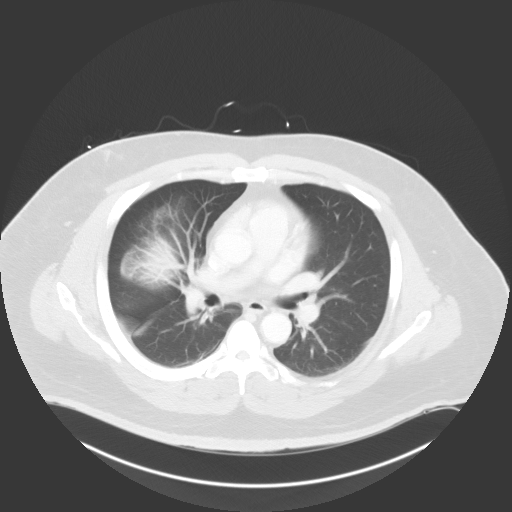
[im 131/144  lung]
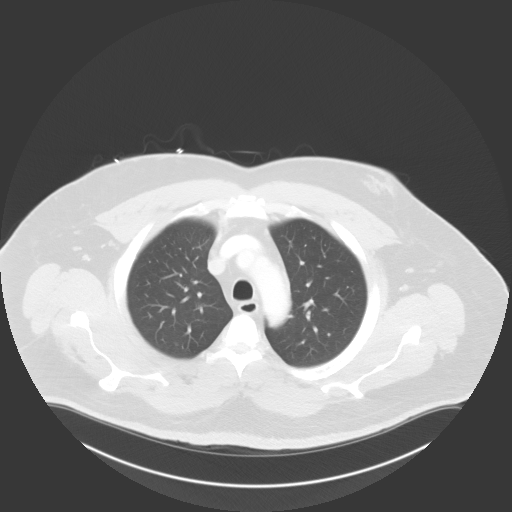

[Series 6: coronal · coronal · 1.04mm/px · 3 of 203 slices shown]
[im 41/203  lung]
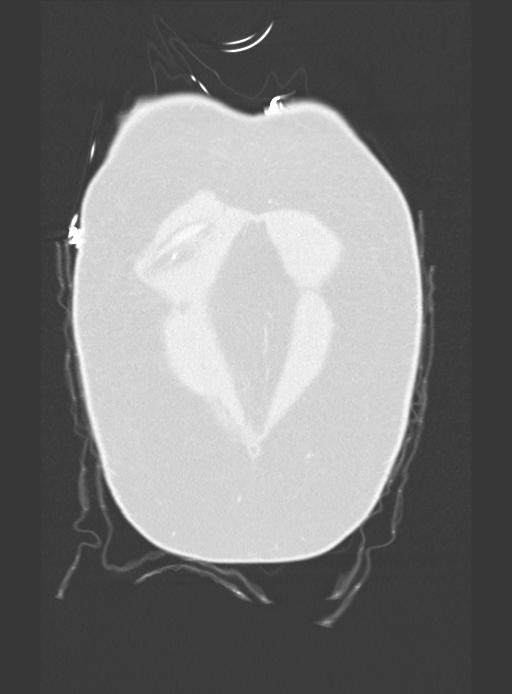
[im 81/203  lung]
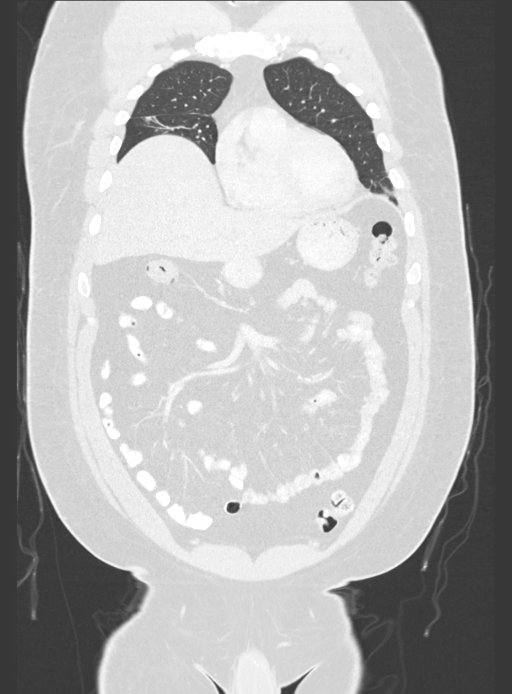
[im 122/203  lung]
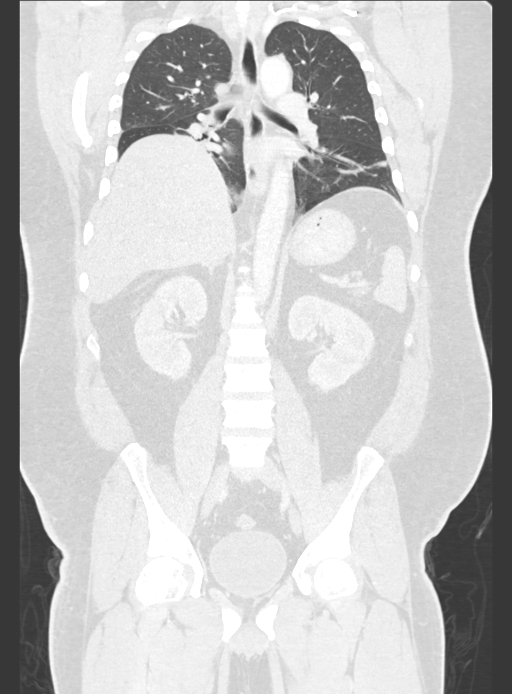

[13 of 36 positions shown; findings below may reference images not displayed]

FINDINGS: CT CHEST FINDINGS

Cardiovascular: The heart and great vessels are unremarkable without
pericardial effusion. Stent is identified within the LAD
distribution. Normal caliber of the thoracic aorta.

Mediastinum/Nodes: No enlarged mediastinal, hilar, or axillary lymph
nodes. Thyroid gland, trachea, and esophagus demonstrate no
significant findings.

Lungs/Pleura: Scattered areas of atelectasis are seen at the lung
bases. No airspace disease, effusion, or pneumothorax. Elevated
right hemidiaphragm.

Musculoskeletal: No acute or destructive bony lesions. Bilateral
gynecomastia again noted. Reconstructed images demonstrate no
additional findings.

CT ABDOMEN PELVIS FINDINGS

Hepatobiliary: Heterogeneous decreased attenuation of the liver
consistent with hepatic steatosis. The gallbladder is unremarkable.
No biliary dilation.

Pancreas: Unremarkable. No pancreatic ductal dilatation or
surrounding inflammatory changes.

Spleen: Normal in size without focal abnormality.

Adrenals/Urinary Tract: Adrenal glands are unremarkable. Kidneys are
normal, without renal calculi, focal lesion, or hydronephrosis.
Bladder is unremarkable.

Stomach/Bowel: No bowel obstruction or ileus. Normal appendix right
lower quadrant. No bowel wall thickening or inflammatory change.
Minimal sigmoid diverticulosis without diverticulitis.

Vascular/Lymphatic: No significant vascular findings are present. No
enlarged abdominal or pelvic lymph nodes. The

Reproductive: Prostate is unremarkable.

Other: No free fluid or free gas. No abdominal wall hernia.

Musculoskeletal: No acute or destructive bony lesions. Reconstructed
images demonstrate no additional findings.
IMPRESSION: 1. No acute intrathoracic, intra-abdominal, or intrapelvic process.
2. Hepatic steatosis.
3. Minimal sigmoid diverticulosis without diverticulitis.
4. Stable gynecomastia.

## 2021-11-27 IMAGING — CT CT ABD-PELV W/ CM
2 of 5 series · 13 of 36 positions shown, 16 images · IV contrast (APPLIED)
Comparison: 06/08/2020, 01/01/2020

CLINICAL DATA: Persistent cough, atrial flutter, leukocytosis

EXAM:
CT CHEST, ABDOMEN, AND PELVIS WITH CONTRAST
TECHNIQUE: Multidetector CT imaging of the chest, abdomen and pelvis was
performed following the standard protocol during bolus
administration of intravenous contrast.
CONTRAST:  100mL OMNIPAQUE IOHEXOL 300 MG/ML  SOLN

[Series 3: cap 5.0 i31f 2 · axial · 0.98mm/px · z∈[+749,+1334]mm · 10 of 144 slices shown, 13 images]
[im 14/144  mediastinal]
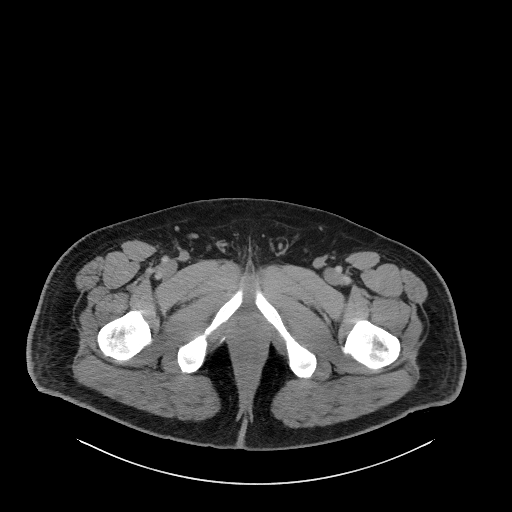
[im 14/144  lung]
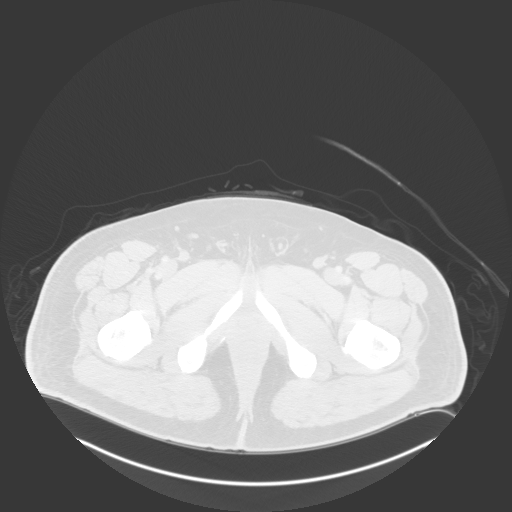
[im 27/144  lung]
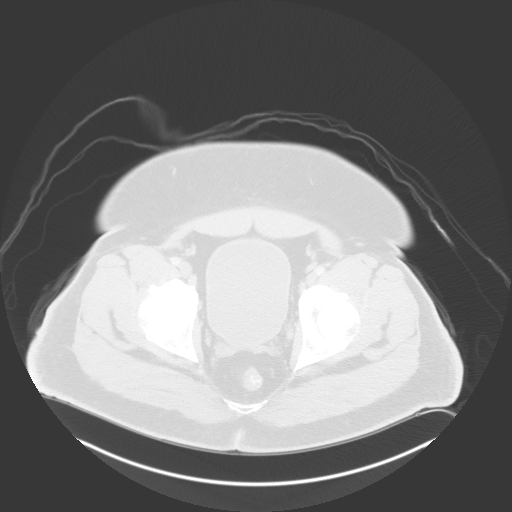
[im 40/144  lung]
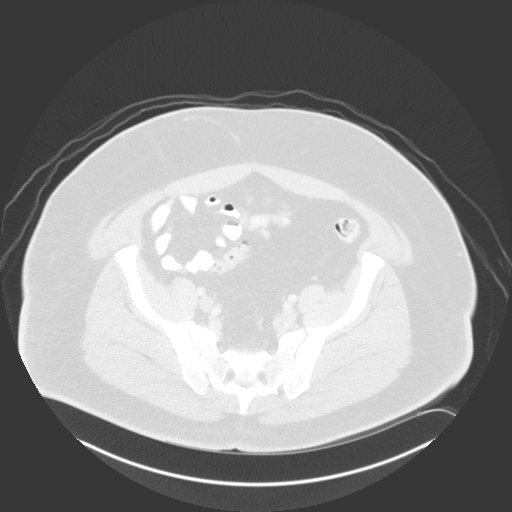
[im 53/144  lung]
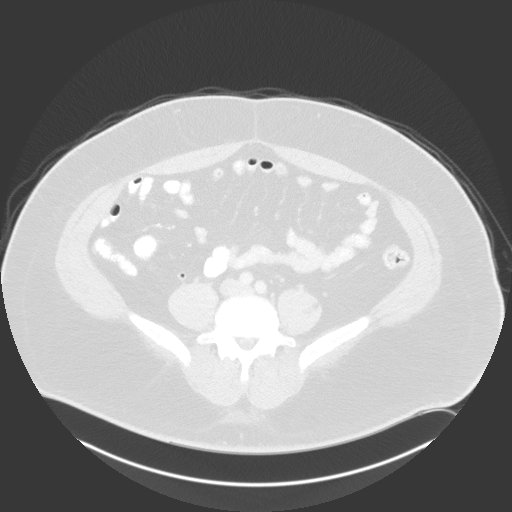
[im 66/144  mediastinal]
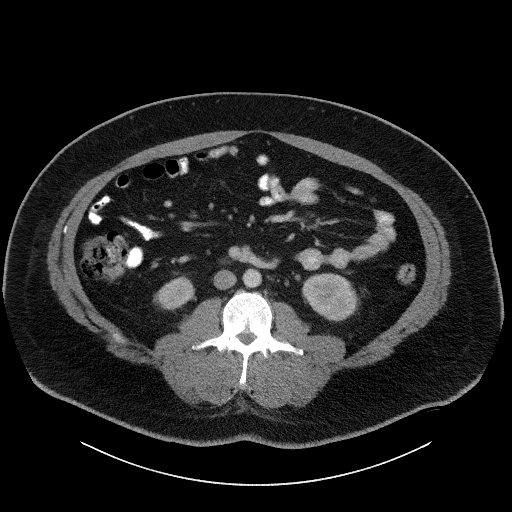
[im 66/144  lung]
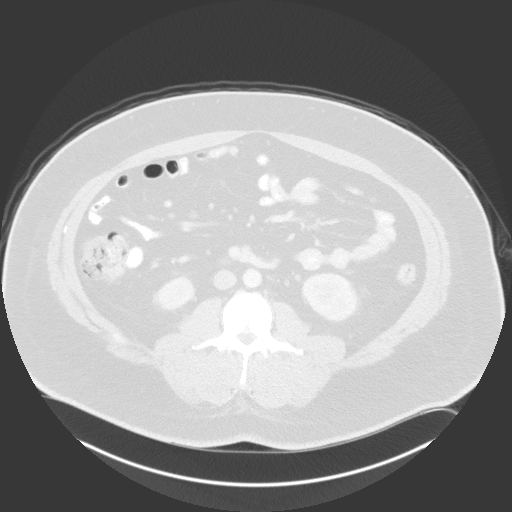
[im 79/144  lung]
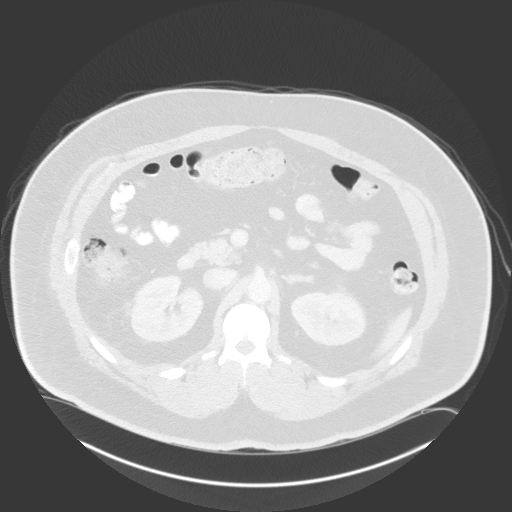
[im 92/144  lung]
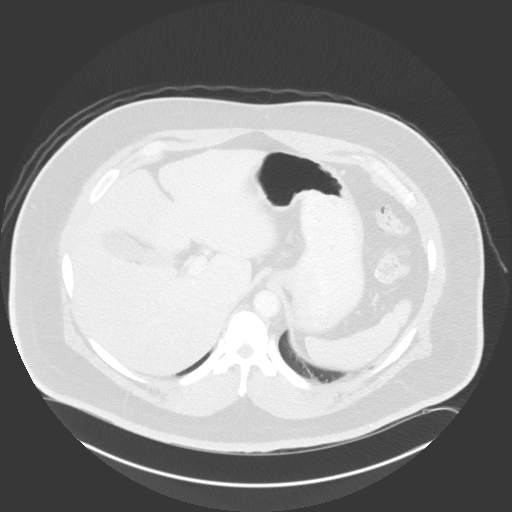
[im 105/144  lung]
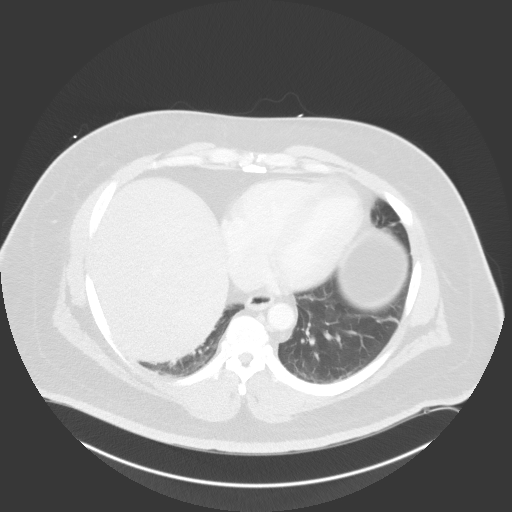
[im 118/144  mediastinal]
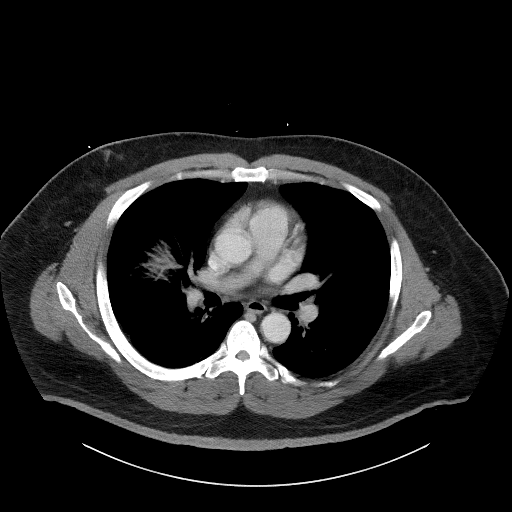
[im 118/144  lung]
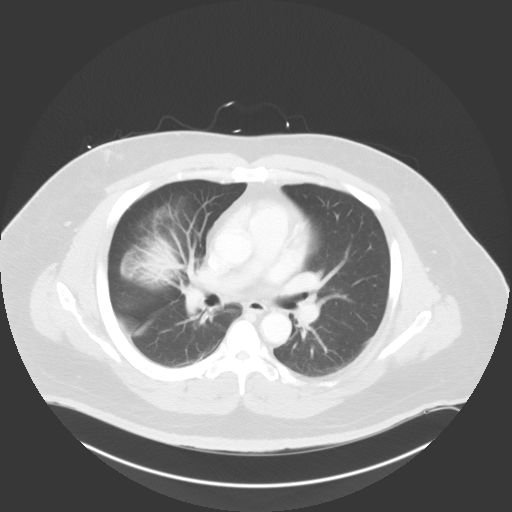
[im 131/144  lung]
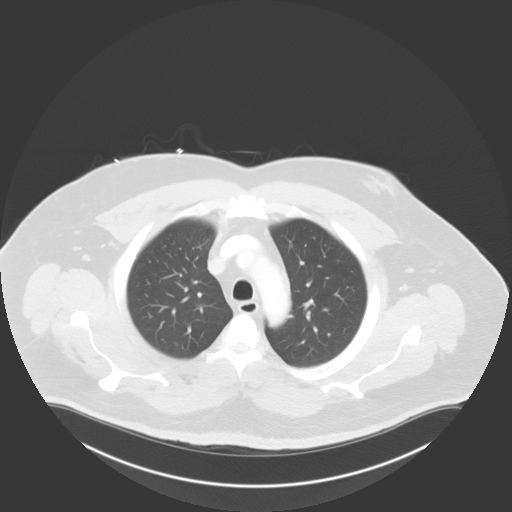

[Series 6: coronal · coronal · 1.04mm/px · 3 of 203 slices shown]
[im 41/203  lung]
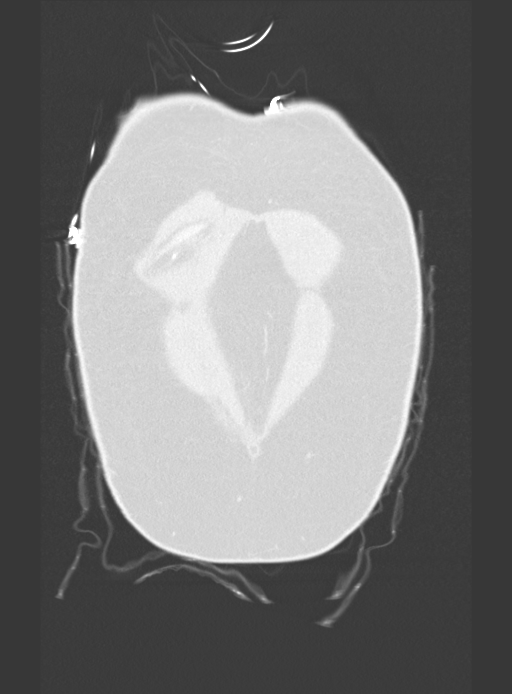
[im 81/203  lung]
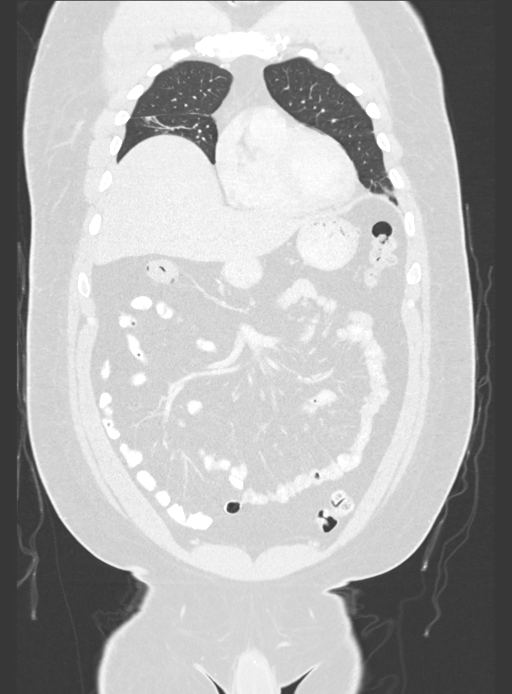
[im 122/203  lung]
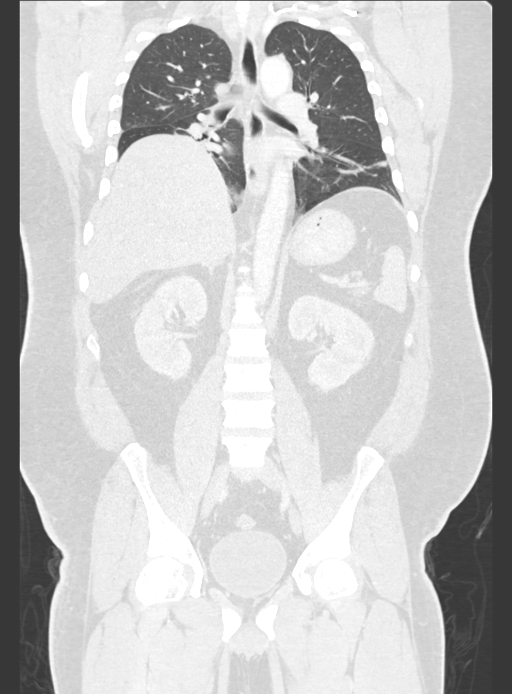

[13 of 36 positions shown; findings below may reference images not displayed]

FINDINGS: CT CHEST FINDINGS

Cardiovascular: The heart and great vessels are unremarkable without
pericardial effusion. Stent is identified within the LAD
distribution. Normal caliber of the thoracic aorta.

Mediastinum/Nodes: No enlarged mediastinal, hilar, or axillary lymph
nodes. Thyroid gland, trachea, and esophagus demonstrate no
significant findings.

Lungs/Pleura: Scattered areas of atelectasis are seen at the lung
bases. No airspace disease, effusion, or pneumothorax. Elevated
right hemidiaphragm.

Musculoskeletal: No acute or destructive bony lesions. Bilateral
gynecomastia again noted. Reconstructed images demonstrate no
additional findings.

CT ABDOMEN PELVIS FINDINGS

Hepatobiliary: Heterogeneous decreased attenuation of the liver
consistent with hepatic steatosis. The gallbladder is unremarkable.
No biliary dilation.

Pancreas: Unremarkable. No pancreatic ductal dilatation or
surrounding inflammatory changes.

Spleen: Normal in size without focal abnormality.

Adrenals/Urinary Tract: Adrenal glands are unremarkable. Kidneys are
normal, without renal calculi, focal lesion, or hydronephrosis.
Bladder is unremarkable.

Stomach/Bowel: No bowel obstruction or ileus. Normal appendix right
lower quadrant. No bowel wall thickening or inflammatory change.
Minimal sigmoid diverticulosis without diverticulitis.

Vascular/Lymphatic: No significant vascular findings are present. No
enlarged abdominal or pelvic lymph nodes. The

Reproductive: Prostate is unremarkable.

Other: No free fluid or free gas. No abdominal wall hernia.

Musculoskeletal: No acute or destructive bony lesions. Reconstructed
images demonstrate no additional findings.
IMPRESSION: 1. No acute intrathoracic, intra-abdominal, or intrapelvic process.
2. Hepatic steatosis.
3. Minimal sigmoid diverticulosis without diverticulitis.
4. Stable gynecomastia.

## 2021-11-30 ENCOUNTER — Telehealth: Payer: Self-pay

## 2021-11-30 DIAGNOSIS — Z1211 Encounter for screening for malignant neoplasm of colon: Secondary | ICD-10-CM | POA: Diagnosis not present

## 2021-11-30 DIAGNOSIS — R030 Elevated blood-pressure reading, without diagnosis of hypertension: Secondary | ICD-10-CM | POA: Diagnosis not present

## 2021-11-30 DIAGNOSIS — Z9229 Personal history of other drug therapy: Secondary | ICD-10-CM | POA: Diagnosis not present

## 2021-11-30 NOTE — Telephone Encounter (Signed)
° °  Pre-operative Risk Assessment    Patient Name: [name] Merritt  DOB: May 11, 1971 MRN: [id number]      Request for Surgical Clearance    Procedure:   Colonoscopy  Date of Surgery:  Clearance 02/23/22                                 Surgeon:  Dr. Dulce Sellar Surgeon's Group or Practice Name:  [facility] Phone number:  [phone] Fax number:  [phone]    Type of Clearance Requested:   - Medical  - Pharmacy:  Hold Clopidogrel (Plavix) and Apixaban (Eliquis)     Type of Anesthesia:  Not Indicated   Additional requests/questions:  Please advise surgeon/provider what medications should be held. Please fax a copy of Clearance to the surgeon's office.  Signed, Ivin Booty   11/30/2021, 3:55 PM

## 2021-12-07 DIAGNOSIS — Z79899 Other long term (current) drug therapy: Secondary | ICD-10-CM | POA: Diagnosis not present

## 2021-12-08 ENCOUNTER — Other Ambulatory Visit (HOSPITAL_COMMUNITY): Payer: Self-pay

## 2021-12-08 LAB — COMPREHENSIVE METABOLIC PANEL
ALT: 23 IU/L (ref 0–44)
AST: 23 IU/L (ref 0–40)
Albumin/Globulin Ratio: 1.8 (ref 1.2–2.2)
Albumin: 4.6 g/dL (ref 4.0–5.0)
Alkaline Phosphatase: 73 IU/L (ref 44–121)
BUN/Creatinine Ratio: 18 (ref 9–20)
BUN: 21 mg/dL (ref 6–24)
Bilirubin Total: 0.3 mg/dL (ref 0.0–1.2)
CO2: 24 mmol/L (ref 20–29)
Calcium: 9.6 mg/dL (ref 8.7–10.2)
Chloride: 103 mmol/L (ref 96–106)
Creatinine, Ser: 1.17 mg/dL (ref 0.76–1.27)
Globulin, Total: 2.6 g/dL (ref 1.5–4.5)
Glucose: 120 mg/dL — ABNORMAL HIGH (ref 70–99)
Potassium: 5.2 mmol/L (ref 3.5–5.2)
Sodium: 141 mmol/L (ref 134–144)
Total Protein: 7.2 g/dL (ref 6.0–8.5)
eGFR: 76 mL/min/{1.73_m2} (ref >59)

## 2021-12-12 DIAGNOSIS — G4733 Obstructive sleep apnea (adult) (pediatric): Secondary | ICD-10-CM | POA: Diagnosis not present

## 2021-12-14 DIAGNOSIS — E113511 Type 2 diabetes mellitus with proliferative diabetic retinopathy with macular edema, right eye: Secondary | ICD-10-CM | POA: Diagnosis not present

## 2021-12-15 ENCOUNTER — Other Ambulatory Visit (HOSPITAL_COMMUNITY): Payer: Self-pay

## 2021-12-20 ENCOUNTER — Other Ambulatory Visit (HOSPITAL_COMMUNITY): Payer: Self-pay

## 2021-12-20 DIAGNOSIS — H53469 Homonymous bilateral field defects, unspecified side: Secondary | ICD-10-CM | POA: Diagnosis not present

## 2021-12-20 DIAGNOSIS — G4733 Obstructive sleep apnea (adult) (pediatric): Secondary | ICD-10-CM | POA: Diagnosis not present

## 2021-12-20 DIAGNOSIS — E7800 Pure hypercholesterolemia, unspecified: Secondary | ICD-10-CM | POA: Diagnosis not present

## 2021-12-20 DIAGNOSIS — I10 Essential (primary) hypertension: Secondary | ICD-10-CM | POA: Diagnosis not present

## 2021-12-20 DIAGNOSIS — K219 Gastro-esophageal reflux disease without esophagitis: Secondary | ICD-10-CM | POA: Diagnosis not present

## 2021-12-20 DIAGNOSIS — E11319 Type 2 diabetes mellitus with unspecified diabetic retinopathy without macular edema: Secondary | ICD-10-CM | POA: Diagnosis not present

## 2021-12-20 DIAGNOSIS — I255 Ischemic cardiomyopathy: Secondary | ICD-10-CM | POA: Diagnosis not present

## 2021-12-20 DIAGNOSIS — Z7984 Long term (current) use of oral hypoglycemic drugs: Secondary | ICD-10-CM | POA: Diagnosis not present

## 2021-12-20 DIAGNOSIS — H25813 Combined forms of age-related cataract, bilateral: Secondary | ICD-10-CM | POA: Diagnosis not present

## 2021-12-20 DIAGNOSIS — G4700 Insomnia, unspecified: Secondary | ICD-10-CM | POA: Diagnosis not present

## 2021-12-20 DIAGNOSIS — I4892 Unspecified atrial flutter: Secondary | ICD-10-CM | POA: Diagnosis not present

## 2021-12-20 DIAGNOSIS — E113593 Type 2 diabetes mellitus with proliferative diabetic retinopathy without macular edema, bilateral: Secondary | ICD-10-CM | POA: Diagnosis not present

## 2021-12-20 MED ORDER — TRAZODONE HCL 100 MG PO TABS
100.0000 mg | ORAL_TABLET | Freq: Every day | ORAL | 3 refills | Status: DC
  Filled 2021-12-20: qty 90, 90d supply, fill #0
  Filled 2022-03-30: qty 90, 90d supply, fill #1
  Filled 2022-09-15: qty 90, 90d supply, fill #2

## 2021-12-20 MED ORDER — METOPROLOL SUCCINATE ER 25 MG PO TB24
50.0000 mg | ORAL_TABLET | Freq: Every day | ORAL | 3 refills | Status: DC
  Filled 2021-12-20 – 2022-03-18 (×2): qty 180, 90d supply, fill #0
  Filled 2022-06-17: qty 180, 90d supply, fill #1
  Filled 2022-09-15: qty 180, 90d supply, fill #2

## 2021-12-22 ENCOUNTER — Other Ambulatory Visit (HOSPITAL_COMMUNITY): Payer: Self-pay

## 2021-12-26 ENCOUNTER — Other Ambulatory Visit (HOSPITAL_COMMUNITY): Payer: Self-pay

## 2021-12-28 ENCOUNTER — Other Ambulatory Visit (HOSPITAL_COMMUNITY): Payer: Self-pay

## 2021-12-28 ENCOUNTER — Encounter: Payer: Self-pay | Admitting: Cardiovascular Disease

## 2021-12-28 ENCOUNTER — Other Ambulatory Visit: Payer: Self-pay

## 2021-12-28 ENCOUNTER — Ambulatory Visit (INDEPENDENT_AMBULATORY_CARE_PROVIDER_SITE_OTHER): Payer: 59 | Admitting: Cardiovascular Disease

## 2021-12-28 DIAGNOSIS — E785 Hyperlipidemia, unspecified: Secondary | ICD-10-CM | POA: Diagnosis not present

## 2021-12-28 DIAGNOSIS — G4733 Obstructive sleep apnea (adult) (pediatric): Secondary | ICD-10-CM | POA: Diagnosis not present

## 2021-12-28 DIAGNOSIS — Z79899 Other long term (current) drug therapy: Secondary | ICD-10-CM

## 2021-12-28 DIAGNOSIS — I2102 ST elevation (STEMI) myocardial infarction involving left anterior descending coronary artery: Secondary | ICD-10-CM | POA: Diagnosis not present

## 2021-12-28 DIAGNOSIS — I10 Essential (primary) hypertension: Secondary | ICD-10-CM | POA: Diagnosis not present

## 2021-12-28 MED ORDER — NITROGLYCERIN 0.4 MG SL SUBL
0.4000 mg | SUBLINGUAL_TABLET | SUBLINGUAL | 3 refills | Status: DC | PRN
  Filled 2021-12-28: qty 25, 5d supply, fill #0

## 2021-12-28 MED ORDER — AMLODIPINE BESYLATE 5 MG PO TABS
5.0000 mg | ORAL_TABLET | Freq: Every day | ORAL | 5 refills | Status: DC
  Filled 2021-12-28: qty 30, 30d supply, fill #0
  Filled 2022-01-18: qty 30, 30d supply, fill #1
  Filled 2022-02-18: qty 30, 30d supply, fill #2
  Filled 2022-03-18: qty 30, 30d supply, fill #3
  Filled 2022-05-01: qty 30, 30d supply, fill #4
  Filled 2022-05-26: qty 30, 30d supply, fill #5

## 2021-12-28 NOTE — Progress Notes (Signed)
Cardiology Office Note    Date:  12/30/2021   ID:  [name] Merritt, DOB 1971-02-11, MRN [id number]  PCP:  Renford Dills, MD  Cardiologist:  Nicki Guadalajara, MD   6 week F/u cardiology/sleep evaluation    History of Present Illness:  [name] Merritt is a 51 y.o. male who has a history of obesity, hypertension, hyperlipidemia, insulin-dependent diabetes mellitus, as well as diagnosed, but not treated sleep apnea.  On May 29, 2020 he experienced recurrent intermittent episodes of chest tightness for the majority of the day.  His symptoms ultimately resolved.  In the early morning at 5 AM on that evening he developed severe chest pain and presented to the emergency room where ECG showed acute anterolateral ST segment elevation.  A code STEMI was activated.  He was taken emergently to the catheterization laboratory and was found to have total occlusion of the mid LAD with TIMI 0 flow and absence of collaterals.  There was concomitant CAD with tortuous first diagonal vessel with 70% narrowing beyond a sharp bend, 20% narrowing beyond a sharp bend in the ramus immediate vessel, 50% focal OM 3 stenosis in a dominant left circumflex coronary artery and 70% focal stenosis in the midportion of a small PLA branch.  He had a normal small nondominant RCA.  He underwent successful PCI to the mid LAD with ultimate insertion of a Resolute Onyx 3.0 x 22 mm stent postdilated to 3.25 mm with restoration of TIMI-3 flow. I had not seen him since his initial acute catheterization and intervention.  An echo Doppler study on January 30, 2020 showed an EF of 35 to 40% with akinesis of the mid septum extending to the apex consistent with his LAD infarction.  He had grade 2 diastolic dysfunction.  He was initially placed on metoprolol which was switched to carvedilol.  He was started on Entresto and high intensity statin therapy.  He also was enrolled in the AEGIS trial.  When he presented for his infusion as part of the AEGIS   Trial on June 08, 2020 he was noted to be in atrial flutter.  Earlier he had taken Sudafed for nasal congestion.  He was subsequently sent to the emergency room.  Carvedilol was switched back to metoprolol for improved rate control, he was started on Eliquis and Brilinta was changed to Plavix.  He underwent TEE DC cardioversion by Dr. Jerene Pitch on June 10, 2020 and post procedure he was discharged on Plavix/Eliquis.  He was seen by Azalee Course, PA on July 01, 2020.  At that time, he was was stable and did not have any recurrent chest pain.  He was maintaining sinus rhythm.  With his echo Doppler showing EF of 35 to 40% Entresto was increased to 49/51 mg twice a day.  He was on insulin for diabetes mellitus.  Of note, prior to his initial MI, he had undergone a home sleep study on April 28, 2020 which demonstrated moderate overall sleep apnea with an AHI of 15.4 but he had significant oxygen desaturation to a nadir of 74%.  He spent 54.4 minutes of the test with saturation less than 89%.  Apparently there was never any follow-up of this and he never had been contacted by pulmonary for CPAP titration.    I saw him January 28, 2021 at which time he denied any recurrent chest pain.  He was on Entresto 49/51 mg twice a day, Farxiga 5 mg, metoprolol succinate 25 mg daily for his cardiomyopathy.  He is  on Plavix and Eliquis for anticoagulation.  He is on atorvastatin 80 mg daily for hyperlipidemia.  He continues to be on Lantus insulin.  During his initial office evaluation with me, he was significantly hypertensive with stage II hypertension and I recommended further titration of Entresto to 97/103 mg twice a day.  I also added spironolactone 12.5 mg daily to his medical regimen particularly with his blood pressure elevation and LV dysfunction.  I reviewed his sleep study from July 2021 which confirmed at least moderate overall sleep apnea but I was concerned with his significant oxygen desaturation to a nadir of 74%  perhaps suggesting more severe sleep apnea during REM sleep.  Unfortunately, since 9 months had elapsed since his initial evaluation it was required that he undergo a new assessment and recommended he undergo a split-night sleep study such that CPAP titration could be done at the same time.  I recommended follow-up laboratory.  I recommended a follow-up echo Doppler study following further titration of Entresto.   I last saw him on November 17, 2021.  Since his prior evaluation he felt improved with reference to not experiencing any anginal symptomatology.  His blood pressure has continued to be elevated despite taking Entresto 97/103 twice a day, Farxiga 5 mg daily, metoprolol succinate 25 mg daily and spironolactone 12.5 mg.  He is now on Repatha and atorvastatin 80 mg for hyperlipidemia.  His follow-up echo Doppler study on Mar 08, 2021 showed significant improvement in LV function now at 55 to 60% ejection fraction.  There were no wall motion abnormalities.  There was mild LVH and he had normal diastolic parameters.  There was mild aortic sclerosis without stenosis.  He underwent his split-night sleep evaluation on March 28, 2021.  This confirmed severe sleep apnea with an AHI of 35.5.  Events were very severe during REM sleep with an AHI of 91.4/h.  CPAP was initiated at 6 cm and was titrated to 14 cm water pressure AHI was 0 but this was without REM sleep.  Snoring persisted until 14 cm of water.  He initiated CPAP therapy on October 11, 2021 and received a ResMed air sense 11 AutoSet unit with [employer] as his DME company.  Initial download was obtained from October 18, 2021 through November 16, 2021 which showed extremely poor compliance with usage days and only 20%.  He essentially was not using therapy with average use at only 34 minutes.  His CPAP setting range was 12 to 18 cm and AHI was 1.0 with 95th percentile at 11.  However undoubtedly this data is unreliable due to very poor sleep efficiency and  certainly no REM sleep.  During that evaluation, I spent considerable time with him discussing the importance of sleep apnea treatment regarding his cardiovascular health and that potentially he is at risk if he is not compliant within a 90-day window that his CPAP may be taken back by his DME company.  Since I last saw him, he has felt well.  He is trying hard to use CPAP and admits that he feels better with use.  I obtained a new download from February 6 through December 27, 2021.  He is compliant with usage days at 87% but unfortunately he is still not compliant with usage greater than 4 hours which needs to be at least 70%.  His average use is 3 hours and 47 minutes per night.  However with therapy set at a minimum pressure of 12 and maximum of 18, AHI is  excellent at 2.3.  His 95th percentile pressure is 15.5 with maximum average pressure 16.  He denies any chest pain.  He has lost 6 pounds since his most recent evaluation.  He presents for evaluation.   Past Medical History:  Diagnosis Date   Anxiety    CHF (congestive heart failure) (HCC)    Coronary artery disease    Diabetes mellitus without complication (HCC)    type II   GERD (gastroesophageal reflux disease)    not currently   High cholesterol    Hypertension    Myocardial infarction (HCC) 05/30/2020   New onset atrial flutter (HCC) 06/09/2020   Overweight    Sleep apnea     Past Surgical History:  Procedure Laterality Date   CARDIOVERSION N/A 06/10/2020   Procedure: CARDIOVERSION;  Surgeon: Little Ishikawa, MD;  Location: [facility] ENDOSCOPY;  Service: Cardiovascular;  Laterality: N/A;   CORONARY STENT INTERVENTION N/A 05/30/2020   Procedure: CORONARY STENT INTERVENTION;  Surgeon: Lennette Bihari, MD;  Location: [facility] INVASIVE CV LAB;  Service: Cardiovascular;  Laterality: N/A;   CORONARY/GRAFT ACUTE MI REVASCULARIZATION N/A 05/30/2020   Procedure: Coronary/Graft Acute MI Revascularization;  Surgeon: Lennette Bihari, MD;  Location: [facility]  INVASIVE CV LAB;  Service: Cardiovascular;  Laterality: N/A;   GAS INSERTION Right 07/19/2021   Procedure: INSERTION OF GAS - C3F8;  Surgeon: Carmela Rima, MD;  Location: [facility] OR;  Service: Ophthalmology;  Laterality: Right;   GAS/FLUID EXCHANGE Right 07/19/2021   Procedure: GAS/FLUID EXCHANGE;  Surgeon: Carmela Rima, MD;  Location: [facility] OR;  Service: Ophthalmology;  Laterality: Right;   LEFT HEART CATH AND CORONARY ANGIOGRAPHY N/A 05/30/2020   Procedure: LEFT HEART CATH AND CORONARY ANGIOGRAPHY;  Surgeon: Lennette Bihari, MD;  Location: [facility] INVASIVE CV LAB;  Service: Cardiovascular;  Laterality: N/A;   MEMBRANE PEEL Right 07/19/2021   Procedure: MEMBRANE PEEL;  Surgeon: Carmela Rima, MD;  Location: [facility] OR;  Service: Ophthalmology;  Laterality: Right;   PARS PLANA VITRECTOMY 27 GAUGE Right 09/27/2021   Procedure: PARS PLANA VITRECTOMY 27 GAUGE;  Surgeon: Carmela Rima, MD;  Location: [facility] OR;  Service: Ophthalmology;  Laterality: Right;   PHOTOCOAGULATION WITH LASER Right 07/19/2021   Procedure: PHOTOCOAGULATION WITH LASER;  Surgeon: Carmela Rima, MD;  Location: [facility] OR;  Service: Ophthalmology;  Laterality: Right;   PHOTOCOAGULATION WITH LASER Right 09/27/2021   Procedure: PHOTOCOAGULATION WITH LASER;  Surgeon: Carmela Rima, MD;  Location: [facility] OR;  Service: Ophthalmology;  Laterality: Right;   REPAIR OF COMPLEX TRACTION RETINAL DETACHMENT Right 07/19/2021   Procedure: REPAIR OF COMPLEX TRACTION RETINAL DETACHMENT;  Surgeon: Carmela Rima, MD;  Location: [facility] OR;  Service: Ophthalmology;  Laterality: Right;   TEE WITHOUT CARDIOVERSION N/A 06/10/2020   Procedure: TRANSESOPHAGEAL ECHOCARDIOGRAM (TEE);  Surgeon: Little Ishikawa, MD;  Location: [facility] ENDOSCOPY;  Service: Cardiovascular;  Laterality: N/A;   torn rotator cuff  2018   2018    Current Medications: Outpatient Medications Prior to Visit  Medication Sig Dispense Refill   albuterol (VENTOLIN HFA) 108 (90 Base) MCG/ACT inhaler Inhale 2  puffs into the lungs every 6 (six) hours as needed for wheezing or shortness of breath.     ALPRAZolam (XANAX) 0.25 MG tablet Take 1 tablet (0.25 mg total) by mouth daily as needed. 20 tablet 0   apixaban (ELIQUIS) 5 MG TABS tablet TAKE 1 TABLET BY MOUTH TWO TIMES DAILY 180 tablet 1   Ascorbic Acid (VITAMIN C) 1000 MG tablet Take 1,000 mg by mouth daily.  atorvastatin (LIPITOR) 80 MG tablet Take 1 tablet (80 mg total) by mouth daily at 6 PM. 90 tablet 3   cetirizine (ZYRTEC) 10 MG tablet TAKE 1 TABLET(10 MG) BY MOUTH DAILY 30 tablet 5   CINNAMON PO Take 1,500 mg by mouth 2 (two) times daily.     clopidogrel (PLAVIX) 75 MG tablet Take 1 tablet (75 mg total) by mouth daily. 90 tablet 2   dapagliflozin propanediol (FARXIGA) 10 MG TABS tablet Take 1 tablet (10 mg total) by mouth daily. 30 tablet 11   Evolocumab (REPATHA SURECLICK) 528 MG/ML SOAJ Inject 1 Dose into the skin every 14 (fourteen) days. 2 mL 11   metFORMIN (GLUCOPHAGE) 1000 MG tablet Take 1/2 tablet by mouth daily for 1 week then increase to 1 tab daily 90 tablet 3   metoprolol succinate (TOPROL-XL) 25 MG 24 hr tablet Take 2 tablets (50 mg total) by mouth daily. 180 tablet 3   pantoprazole (PROTONIX) 40 MG tablet Take 1 tablet (40 mg total) by mouth 2 (two) times daily. 180 tablet 3   sacubitril-valsartan (ENTRESTO) 97-103 MG Take 1 tablet by mouth 2 (two) times daily. 60 tablet 6   sildenafil (REVATIO) 20 MG tablet Take 20 mg by mouth daily as needed for erectile dysfunction.     spironolactone (ALDACTONE) 25 MG tablet Take 0.5 tablets (12.5 mg total) by mouth 2 (two) times daily. 30 tablet 11   traZODone (DESYREL) 100 MG tablet Take 1 tablet (100 mg total) by mouth at bedtime. 90 tablet 3   zinc gluconate 50 MG tablet Take 50 mg by mouth daily.     nitroGLYCERIN (NITROSTAT) 0.4 MG SL tablet Place 1 tablet (0.4 mg total) under the tongue every 5 (five) minutes as needed for chest pain. 25 tablet 3   acetaminophen (TYLENOL) 650 MG CR  tablet Take 1,300 mg by mouth every 8 (eight) hours as needed for pain. (Patient not taking: Reported on 12/28/2021)     chlorpheniramine-HYDROcodone (TUSSIONEX) 10-8 MG/5ML SUER Take 5 mLs by mouth every 12 (twelve) hours as needed for 10 days (Patient not taking: Reported on 12/28/2021) 100 mL 0   fluticasone (FLONASE) 50 MCG/ACT nasal spray SHAKE LIQUID AND USE 1 SPRAY IN EACH NOSTRIL DAILY (Patient not taking: Reported on 12/28/2021) 16 g 2   insulin glargine-yfgn (SEMGLEE, YFGN,) 100 UNIT/ML Pen Inject 55 Units into the skin daily or as directed. (Patient not taking: Reported on 12/28/2021) 45 mL 3   magnesium citrate SOLN Take 1 Bottle by mouth once as needed for mild constipation or moderate constipation.  (Patient not taking: Reported on 12/28/2021)     Melatonin 10 MG TABS Take 10 mg by mouth at bedtime as needed (sleep). (Patient not taking: Reported on 12/28/2021)     ofloxacin (OCUFLOX) 0.3 % ophthalmic solution Place 1 drop into the right eye 4 (four) times daily for 1 week (Patient not taking: Reported on 12/28/2021) 5 mL 0   Polyethyl Glycol-Propyl Glycol (SYSTANE ULTRA PF OP) Place 1 drop into both eyes daily as needed (dry eyes). (Patient not taking: Reported on 12/28/2021)     No facility-administered medications prior to visit.     Allergies:   Pineapple, Tomato, and Rosuvastatin calcium   Social History   Socioeconomic History   Marital status: Married    Spouse name: Not on file   Number of children: Not on file   Years of education: Not on file   Highest education level: Not on file  Occupational History  Occupation: maintenance  Tobacco Use   Smoking status: Never   Smokeless tobacco: Former    Types: Chew    Quit date: 2017  Vaping Use   Vaping Use: Never used  Substance and Sexual Activity   Alcohol use: Not Currently   Drug use: Not Currently    Types: Marijuana    Comment: 2006- last time   Sexual activity: Not on file  Other Topics Concern   Not on file  Social  History Narrative   Not on file   Social Determinants of Health   Financial Resource Strain: Not on file  Food Insecurity: Not on file  Transportation Needs: Not on file  Physical Activity: Not on file  Stress: Not on file  Social Connections: Not on file    Socially, he is married most recently for 2 years.  He has 6 boys and a daughter.  He previously was working as a Therapist, occupational.  Family History:  The patient's family history includes Diabetes in his father, maternal grandfather, maternal grandmother, mother, paternal grandfather, and paternal grandmother; Hypertension in his father, maternal grandfather, maternal grandmother, mother, paternal grandfather, and paternal grandmother.   His father is 79 and has dementia and had a history of MI.  Mother is 4 and has history of MI.  ROS General: Negative; No fevers, chills, or night sweats;  HEENT: Negative; No changes in vision or hearing, sinus congestion, difficulty swallowing Pulmonary: Negative; No cough, wheezing, shortness of breath, hemoptysis Cardiovascular: Positive for hypertension, hyperlipidemia, CAD,. no recurrent chest pain. GI: Negative; No nausea, vomiting, diarrhea, or abdominal pain GU: Negative; No dysuria, hematuria, or difficulty voiding Musculoskeletal: Negative; no myalgias, joint pain, or weakness Hematologic/Oncology: Negative; no easy bruising, bleeding Endocrine: Negative; no heat/cold intolerance; no diabetes Neuro: Negative; no changes in balance, headaches Skin: Negative; No rashes or skin lesions Psychiatric: Negative; No behavioral problems, depression Sleep: Positive for snoring, OSA on home sleep study; no bruxism, restless legs, hypnogognic hallucinations, no cataplexy Other comprehensive 14 point system review is negative.   PHYSICAL EXAM:   VS:  BP 140/90    Pulse 62    Ht $R'6\' 1"'mB$  (1.854 m)    Wt (!) 320 lb 6.4 oz (145.3 kg)    SpO2 95%    BMI 42.27 kg/m     Repeat blood pressure by  me was 160/90.  Wt Readings from Last 3 Encounters:  12/28/21 (!) 320 lb 6.4 oz (145.3 kg)  11/17/21 (!) 328 lb 12.8 oz (149.1 kg)  09/27/21 (!) 328 lb (148.8 kg)    General: Alert, oriented, no distress.  Skin: normal turgor, no rashes, warm and dry HEENT: Normocephalic, atraumatic. Pupils equal round and reactive to light; sclera anicteric; extraocular muscles intact;  Nose without nasal septal hypertrophy Mouth/Parynx benign; Mallinpatti scale 4 Neck: Thick neck; no JVD, no carotid bruits; normal carotid upstroke Lungs: clear to ausculatation and percussion; no wheezing or rales Chest wall: without tenderness to palpitation Heart: PMI not displaced, RRR, s1 s2 normal, 1/6 systolic murmur, no diastolic murmur, no rubs, gallops, thrills, or heaves Abdomen: Central adiposity soft, nontender; no hepatosplenomehaly, BS+; abdominal aorta nontender and not dilated by palpation. Back: no CVA tenderness Pulses 2+ Musculoskeletal: full range of motion, normal strength, no joint deformities Extremities: no clubbing cyanosis or edema, Homan's sign negative  Neurologic: grossly nonfocal; Cranial nerves grossly wnl Psychologic: Normal mood and affect   Studies/Labs Reviewed:   December 28, 2021 ECG (independently read by me): NSR at 62; Inferior and  anterior Q waves; no ectopy, normal intervals  November 17, 2021 ECG (independently read by me): NSR at 65, Inferior  and anteriro Q waves ; no ectopy, normal intervals  January 28, 2021 ECG (independently read by me): Sinus bradycardia at 55; QS anteriorly c/w old Ant MI;  Recent Labs: BMP Latest Ref Rng & Units 12/07/2021 09/27/2021 09/27/2021  Glucose 70 - 99 mg/dL 120(H) 105(H) 112(H)  BUN 6 - 24 mg/dL $Remove'21 16 16  'lWLxAij$ Creatinine 0.76 - 1.27 mg/dL 1.17 0.90 0.97  BUN/Creat Ratio 9 - 20 18 - -  Sodium 134 - 144 mmol/L 141 138 134(L)  Potassium 3.5 - 5.2 mmol/L 5.2 4.4 6.1(H)  Chloride 96 - 106 mmol/L 103 101 105  CO2 20 - 29 mmol/L 24 - 23  Calcium 8.7 -  10.2 mg/dL 9.6 - 8.8(L)     Hepatic Function Latest Ref Rng & Units 12/07/2021 01/28/2021 07/29/2020  Total Protein 6.0 - 8.5 g/dL 7.2 7.0 6.9  Albumin 4.0 - 5.0 g/dL 4.6 4.2 4.3  AST 0 - 40 IU/L $Remov'23 17 15  'JEnYdy$ ALT 0 - 44 IU/L $Remov'23 18 13  'RfTJvL$ Alk Phosphatase 44 - 121 IU/L 73 83 77  Total Bilirubin 0.0 - 1.2 mg/dL 0.3 0.5 0.6  Bilirubin, Direct 0.00 - 0.40 mg/dL - - 0.18    CBC Latest Ref Rng & Units 09/27/2021 07/19/2021 01/28/2021  WBC 4.0 - 10.5 K/uL - 4.2 4.8  Hemoglobin 13.0 - 17.0 g/dL 15.6 14.2 14.2  Hematocrit 39.0 - 52.0 % 46.0 44.3 42.2  Platelets 150 - 400 K/uL - 248 259   Lab Results  Component Value Date   MCV 96.9 07/19/2021   MCV 90 01/28/2021   MCV 92 07/01/2020   Lab Results  Component Value Date   TSH 2.440 01/28/2021   Lab Results  Component Value Date   HGBA1C 6.7 (H) 01/28/2021     BNP No results found for: BNP  ProBNP    Component Value Date/Time   PROBNP 267 (H) 01/28/2021 1000     Lipid Panel     Component Value Date/Time   CHOL 179 01/28/2021 1000   TRIG 52 01/28/2021 1000   HDL 45 01/28/2021 1000   CHOLHDL 4.0 01/28/2021 1000   CHOLHDL 5.1 05/30/2020 0952   VLDL 15 05/30/2020 0952   LDLCALC 124 (H) 01/28/2021 1000   LABVLDL 10 01/28/2021 1000     RADIOLOGY: No results found.   Additional studies/ records that were reviewed today include:   EMERGENT CATH/PCI: 05/30/2020 3rd Mrg lesion is 50% stenosed. LPDA lesion is 70% stenosed. LPAV-1 lesion is 30% stenosed. LPAV-2 lesion is 30% stenosed. 1st Diag lesion is 70% stenosed. Ramus lesion is 20% stenosed. Mid LAD lesion is 100% stenosed. Post intervention, there is a 0% residual stenosis. A stent was successfully placed.   Acute anterolateral ST segment elevation myocardial infarction secondary to total occlusion of the mid LAD with TIMI 0 flow and absence of collaterals.   Concomitant CAD with tortuous 1st diagonal vessel with 70% narrowing beyond a sharp bend in the vessel, 20%  narrowing beyond a sharp bend in the ramus immediate vessel, and dominant left circumflex coronary artery with 50% focal OM 3 stenosis, distal 30% stenoses in the AV groove, and focal 70% stenosis in the midportion of a small PLA branch.  Normal small nondominant RCA.   LVEDP 12 millimeters Hg.    Successful PCI to the totally occluded mid LAD with ultimate insertion of a Resolute Onyx 3.0  x 22 mm DES stent postdilated to 3.25 mm with digital narrowing of 0% and restoration of TIMI-3 flow.   RECOMMENDATION: DAPT for minimum of 1 year.  Aggressive lipid-lowering therapy with titration of atorvastatin to 80 mg daily.  We will reinitiate ARB therapy with losartan initially at lower dose, add low-dose beta-blocker therapy, and obtain 2D echo Doppler study in a.m. optimal blood pressure control with target blood pressure less than 130/80 and ideal blood pressure less than 120/80.  We will diabetes control.  If patient has LV dysfunction, SGLT 2 inhibition can be considered.   ECHO 05/31/2020 IMPRESSIONS   1. Akinesis of the mid septum into the apex consistent with LAD  infarction. Contrast imaging shows no evidence of LV thrombus. Left  ventricular ejection fraction, by estimation, is 35 to 40%. The left  ventricle has moderately decreased function. The  left ventricle demonstrates regional wall motion abnormalities (see  scoring diagram/findings for description). Left ventricular diastolic  parameters are consistent with Grade II diastolic dysfunction  (pseudonormalization). Elevated left atrial pressure.   2. Right ventricular systolic function is normal. The right ventricular  size is normal. There is normal pulmonary artery systolic pressure. The  estimated right ventricular systolic pressure is 51.0 mmHg.   3. The mitral valve is grossly normal. Trivial mitral valve  regurgitation. No evidence of mitral stenosis.   4. The aortic valve is tricuspid. Aortic valve regurgitation is not   visualized. Mild aortic valve sclerosis is present, with no evidence of  aortic valve stenosis.   5. The inferior vena cava is normal in size with greater than 50%  respiratory variability, suggesting right atrial pressure of 3 mmHg.   Conclusion(s)/Recommendation(s): Findings consistent with ischemic  cardiomyopathy.    TEE 06/10/2020 IMPRESSIONS   1. Left ventricular ejection fraction, by estimation, is 40 to 45%. The  left ventricle has mildly decreased function. The left ventricle  demonstrates regional wall motion abnormalities. Apical hypokinesis.   2. Right ventricular systolic function is normal. The right ventricular  size is normal.   3. The mitral valve is normal in structure. Trivial mitral valve  regurgitation.   4. The aortic valve is tricuspid. Aortic valve regurgitation is not  visualized. No aortic stenosis is present.   5. No left atrial/left atrial appendage thrombus was detected.   03/06/2021 IMPRESSION 1. Apical hypokinesis with overall preserved LV systolic function.   2. Left ventricular ejection fraction, by estimation, is 55 to 60%. The  left ventricle has normal function. The left ventricle has no regional  wall motion abnormalities. There is mild left ventricular hypertrophy.  Left ventricular diastolic parameters  were normal.   3. Right ventricular systolic function is normal. The right ventricular  size is normal. There is normal pulmonary artery systolic pressure.   4. The mitral valve is normal in structure. No evidence of mitral valve  regurgitation. No evidence of mitral stenosis.   5. The aortic valve is tricuspid. Aortic valve regurgitation is not  visualized. Mild aortic valve sclerosis is present, with no evidence of  aortic valve stenosis.   6. The inferior vena cava is normal in size with greater than 50%  respiratory variability, suggesting right atrial pressure of 3 mmHg.     03/28/2021 CLINICAL INFORMATION Sleep Study Type: Split  Night CPAP   Indication for sleep study: Congestive Heart Failure, Diabetes, Hypertension, Obesity, Re-Evaluation, Snoring, Witnessed Apneas   Epworth Sleepiness Score: 12   SLEEP STUDY TECHNIQUE As per the AASM Manual for the  Scoring of Sleep and Associated Events v2.3 (April 2016) with a hypopnea requiring 4% desaturations.   The channels recorded and monitored were frontal, central and occipital EEG, electrooculogram (EOG), submentalis EMG (chin), nasal and oral airflow, thoracic and abdominal wall motion, anterior tibialis EMG, snore microphone, electrocardiogram, and pulse oximetry. Continuous positive airway pressure (CPAP) was initiated when the patient met split night criteria and was titrated according to treat sleep-disordered breathing.   MEDICATIONS acetaminophen (TYLENOL) 650 MG CR tablet albuterol (VENTOLIN HFA) 108 (90 Base) MCG/ACT inhaler ALPRAZolam (XANAX) 0.25 MG tablet apixaban (ELIQUIS) 5 MG TABS tablet atorvastatin (LIPITOR) 80 MG tablet cetirizine (ZYRTEC) 10 MG tablet clopidogrel (PLAVIX) 75 MG tablet dapagliflozin propanediol (FARXIGA) 5 MG TABS table Evolocumab (REPATHA SURECLICK) 093 MG/ML SOAJ fluticasone (FLONASE) 50 MCG/ACT nasal spray ibuprofen (ADVIL) 800 MG tablet Insulin Glargine (BASAGLAR KWIKPEN) 100 UNIT/ML Insulin Glargine-yfgn (SEMGLEE, YFGN,) 100 UNIT/ML SOPN LANTUS SOLOSTAR 100 UNIT/ML Solostar Pen magnesium citrate SOLN metoprolol succinate (TOPROL-XL) 25 MG 24 hr tablet nitroGLYCERIN (NITROSTAT) 0.4 MG SL tablet pantoprazole (PROTONIX) 40 MG tablet sacubitril-valsartan (ENTRESTO) 97-103 MG spironolactone (ALDACTONE) 25 MG tablet terbinafine (LAMISIL) 250 MG tablet   Medications self-administered by patient taken the night of the study : PANTOPRAZOLE SODIUM, ELIQUIS, ENTRESTO, TYLENOL PM, INSULIN GLARGINE   RESPIRATORY PARAMETERS Diagnostic Total AHI (/hr):            35.5     RDI (/hr):         38.7     OA Index (/hr):            0.4        CA Index (/hr):      3.2 REM AHI (/hr):            91.4     NREM AHI (/hr):          31.3     Supine AHI (/hr):         36.5     Non-supine AHI (/hr):        33.6 Min O2 Sat (%):          79.0     Mean O2 (%):  91.7     Time below 88% (min):           8           Titration Optimal Pressure (cm):           14        AHI at Optimal Pressure (/hr):            0          Min O2 at Optimal Pressure (%):       93.0 Supine % at Optimal (%):       0          Sleep % at Optimal (%):         98           SLEEP ARCHITECTURE The recording time for the entire night was 446.9 minutes.   During a baseline period of 167.4 minutes, the patient slept for 150.5 minutes in REM and nonREM, yielding a sleep efficiency of 89.9%%. Sleep onset after lights out was 2.5 minutes with a REM latency of 73.0 minutes. The patient spent 5.6%% of the night in stage N1 sleep, 87.4%% in stage N2 sleep, 0.0%% in stage N3 and 7% in REM.   During the titration period of 273.3 minutes, the patient slept for 226.5 minutes in REM  and nonREM, yielding a sleep efficiency of 82.9%%. Sleep onset after CPAP initiation was 16.4 minutes with a REM latency of 41.0 minutes. The patient spent 5.1%% of the night in stage N1 sleep, 79.0%% in stage N2 sleep, 0.0%% in stage N3 and 15.9% in REM.   CARDIAC DATA The 2 lead EKG demonstrated sinus rhythm. The mean heart rate was 100.0 beats per minute. Other EKG findings include: None.   LEG MOVEMENT DATA The total Periodic Limb Movements of Sleep (PLMS) were 0. The PLMS index was 0.0 .   IMPRESSIONS - Severe obstructive sleep apnea occurred during the diagnostic portion of the study (AHI  35.5/h; RDI 38.7/h); events were very severe during REM sleep (AHI 91.4/h).  CPAP was initiated at 6 cm and was titrated to optimal PAP pressure (14 cm of water). AHI 0 at 14 cm without REM sleep, and 0.8/h at 12 cm with minimal REM sleep. Snoring persisted until 14 cm of water. - No significant central sleep apnea  occurred during the diagnostic portion of the study (CAI 3.2/hour). - Mild oxygen desaturation was noted during the diagnostic portion of the study to a nadir of 79.0%. - The patient snored with loud snoring volume during the diagnostic portion of the study. - No cardiac abnormalities were noted during this study. - Clinically significant periodic limb movements did not occur during sleep.   DIAGNOSIS - Obstructive Sleep Apnea (G47.33)   RECOMMENDATIONS - Recommend an initial trial of CPAP Auto therapy with EPR of 3 at 12 - 18 cm of water with heated humidification. A medium Wide Cushion size [employer] Respironics Minimal Museum/gallery curator Under Nose Frame (M) mask was used for the titration. - Effort should be made to optimize nasal and oropharyngeal patency. - Avoid alcohol, sedatives and other CNS depressants that may worsen sleep apnea and disrupt normal sleep architecture. - Sleep hygiene should be reviewed to assess factors that may improve sleep quality. - Weight management (BMI 42) and regular exercise should be initiated or continued. - Recommend a download in 30 days and sleep clinic evaluation after 4 weeks of therapy.  ASSESSMENT:    1. ST elevation myocardial infarction involving left anterior descending (LAD) coronary artery [facility]): August 2021   2. OSA (obstructive sleep apnea)   3. Primary hypertension   4. Hyperlipidemia with target LDL less than 70   5. Morbid obesity ([place])   6. Medication management     PLAN:  [name] Merritt is a 51 year old gentleman who has a history of morbid obesity, hypertension, hyperlipidemia, and previously untreated obstructive sleep apnea.  He developed severe chest pain and was awakened from sleep the morning May 30, 2020 and was found to have acute anterolateral STEMI secondary to total mid occlusion of his LAD.  He was found to have concomitant CAD and his LAD was successfully stented with a 3.0 x 22 mm Resolute Onyx DES stent  postdilated to 3.25 mm with 100% occlusion being reduced to 0% and restoration of TIMI-3 flow.  He has LV dysfunction secondary to his LAD infarction with initial EF at 35 to 40% which subsequently improved several weeks later to 40 to 45%.  He had developed an episode of atrial flutter following Sudafed and ultimately was converted successfully back to sinus rhythm with DC cardioversion.  At his office visit April 2022 he had stage II hypertension and I further titrated Entresto to 97/103 mg twice a day and added spironolactone 12.5 mg.  A subsequent echo Doppler study of  May 2022 has  demonstrated normalization of LV function with EF at 55 to 60% with mild LVH and mild aortic sclerosis.  When seen in January 2023, his breathing was improved.  Blood pressure continues to be elevated and I recommended further titration of spironolactone to 12.5 mg twice a day and titration of Farxiga to 10 mg daily.  He continued to be on  metoprolol 50 mg, Entresto 97/103 mg twice a day.  He continues to be on Plavix/Eliquis for antiplatelet therapy/anticoagulation.  He is now on Repatha and atorvastatin 80 mg daily for his significant hyperlipidemia and most recent LDL cholesterol in November 2022 was 19.  Blood pressure today continues to be elevated and I am adding amlodipine 5 mg daily to his medical regimen. With reference to his sleep apnea, he required a new sleep evaluation for reassessment and this confirmed severe sleep apnea with an AHI of 35.5.  However events were very severe during REM sleep with AHI of 91.4 and he had significant oxygen desaturation to 79%.  Due to supply chain issues, he received his new ResMed air sense 11 CPAP unit on October 11, 2021.  During my November 17, 2021 evaluation I spent considerable time with him discussing adverse consequences of untreated sleep apnea with reference to his cardiovascular health and specifically discussed its effects on hypertension, nocturnal arrhythmias, increased  risk for atrial fibrillation, inflammation, increased insulin resistance, GERD, and potential for severe nocturnal hypoxemia contributing to nocturnal ischemia particularly since he had previously suffered a myocardial infarction.  His sleep hygiene was very poor and he was often not going to bed until 2 and 3:00 in the morning and waking up between 7 AM.  I discussed optimal sleep duration at 7 and 9 hours in an adult.  He has made significantly improved strides in CPAP use but is still not compliant by virtue of daily usage greater than 4 hours.  I stressed the importance of going to bed earlier which he is now doing and that when he gets up he should not be up for 2 hours before going back to his recliner to sleep but rather put his CPAP mask on which will aid him in returning to sleep much more quickly.  With the severity of his sleep apnea I stressed the importance that he meet compliance standards so that his machine is not taken away from him by his DME company.  An Epworth Sleepiness Scale score was calculated in the office today which is still increased at 13, undoubtedly reflected by his CPAP use averaging 3 hours and 47 minutes.  He has tolerated the new ResMed AirFit F30i mask.  We discussed continued weight loss and exercise.  I will see him in 4 months for follow-up evaluation or sooner as needed.     Medication Adjustments/Labs and Tests Ordered: Current medicines are reviewed at length with the patient today.  Concerns regarding medicines are outlined above.  Medication changes, Labs and Tests ordered today are listed in the Patient Instructions below. Patient Instructions  Medication Instructions:  Start amlodipine $RemoveBeforeDE'5mg'[id number]$  daily  *If you need a refill on your cardiac medications before your next appointment, please call your pharmacy*  Lab Work:   Testing/Procedures:  NONE    NONE  Special Instructions PLEASE STAY COMPLIANT WITH CPSP  Follow-Up: Your next appointment:  4 month(s) In  Person with Shelva Majestic, MD    At [facility], you and your health needs are our priority.  As part of our  continuing mission to provide you with exceptional heart care, we have created designated Provider Care Teams.  These Care Teams include your primary Cardiologist (physician) and Advanced Practice Providers (APPs -  Physician Assistants and Nurse Practitioners) who all work together to provide you with the care you need, when you need it.     Signed, Shelva Majestic, MD  12/30/2021 1:24 PM    [facility] [address], [place], [place],   [address] Phone: [phone]

## 2021-12-28 NOTE — Patient Instructions (Signed)
Medication Instructions:  ?Start amlodipine 5mg  daily ? ?*If you need a refill on your cardiac medications before your next appointment, please call your pharmacy* ? ?Lab Work:   Testing/Procedures:  ?NONE    NONE ? ?Special Instructions ?PLEASE STAY COMPLIANT WITH CPSP ? ?Follow-Up: ?Your next appointment:  4 month(s) In Person with , MD    ?At [facility], you and your health needs are our priority.  As part of our continuing mission to provide you with exceptional heart care, we have created designated Provider Care Teams.  These Care Teams include your primary Cardiologist (physician) and Advanced Practice Providers (APPs -  Physician Assistants and Nurse Practitioners) who all work together to provide you with the care you need, when you need it. ? ? ?

## 2021-12-29 ENCOUNTER — Other Ambulatory Visit (HOSPITAL_COMMUNITY): Payer: Self-pay

## 2021-12-30 ENCOUNTER — Encounter: Payer: Self-pay | Admitting: Cardiovascular Disease

## 2022-01-04 ENCOUNTER — Other Ambulatory Visit (HOSPITAL_COMMUNITY): Payer: Self-pay

## 2022-01-09 DIAGNOSIS — G4733 Obstructive sleep apnea (adult) (pediatric): Secondary | ICD-10-CM | POA: Diagnosis not present

## 2022-01-10 DIAGNOSIS — G4733 Obstructive sleep apnea (adult) (pediatric): Secondary | ICD-10-CM | POA: Diagnosis not present

## 2022-01-12 ENCOUNTER — Other Ambulatory Visit (HOSPITAL_COMMUNITY): Payer: Self-pay

## 2022-01-18 ENCOUNTER — Other Ambulatory Visit (HOSPITAL_COMMUNITY): Payer: Self-pay

## 2022-01-19 ENCOUNTER — Other Ambulatory Visit (HOSPITAL_COMMUNITY): Payer: Self-pay

## 2022-01-19 DIAGNOSIS — E113512 Type 2 diabetes mellitus with proliferative diabetic retinopathy with macular edema, left eye: Secondary | ICD-10-CM | POA: Diagnosis not present

## 2022-01-20 ENCOUNTER — Other Ambulatory Visit (HOSPITAL_COMMUNITY): Payer: Self-pay

## 2022-01-23 ENCOUNTER — Other Ambulatory Visit (HOSPITAL_COMMUNITY): Payer: Self-pay

## 2022-01-25 DIAGNOSIS — E113511 Type 2 diabetes mellitus with proliferative diabetic retinopathy with macular edema, right eye: Secondary | ICD-10-CM | POA: Diagnosis not present

## 2022-02-08 ENCOUNTER — Other Ambulatory Visit (HOSPITAL_COMMUNITY): Payer: Self-pay

## 2022-02-09 DIAGNOSIS — H25811 Combined forms of age-related cataract, right eye: Secondary | ICD-10-CM | POA: Diagnosis not present

## 2022-02-09 DIAGNOSIS — H268 Other specified cataract: Secondary | ICD-10-CM | POA: Diagnosis not present

## 2022-02-13 ENCOUNTER — Other Ambulatory Visit (HOSPITAL_COMMUNITY): Payer: Self-pay

## 2022-02-15 ENCOUNTER — Other Ambulatory Visit (HOSPITAL_COMMUNITY): Payer: Self-pay

## 2022-02-20 ENCOUNTER — Other Ambulatory Visit (HOSPITAL_COMMUNITY): Payer: Self-pay

## 2022-02-27 DIAGNOSIS — E113512 Type 2 diabetes mellitus with proliferative diabetic retinopathy with macular edema, left eye: Secondary | ICD-10-CM | POA: Diagnosis not present

## 2022-03-08 DIAGNOSIS — E113511 Type 2 diabetes mellitus with proliferative diabetic retinopathy with macular edema, right eye: Secondary | ICD-10-CM | POA: Diagnosis not present

## 2022-03-09 ENCOUNTER — Other Ambulatory Visit (HOSPITAL_COMMUNITY): Payer: Self-pay

## 2022-03-09 DIAGNOSIS — H25813 Combined forms of age-related cataract, bilateral: Secondary | ICD-10-CM | POA: Diagnosis not present

## 2022-03-09 DIAGNOSIS — Z794 Long term (current) use of insulin: Secondary | ICD-10-CM | POA: Diagnosis not present

## 2022-03-09 DIAGNOSIS — E113513 Type 2 diabetes mellitus with proliferative diabetic retinopathy with macular edema, bilateral: Secondary | ICD-10-CM | POA: Diagnosis not present

## 2022-03-09 DIAGNOSIS — E1136 Type 2 diabetes mellitus with diabetic cataract: Secondary | ICD-10-CM | POA: Diagnosis not present

## 2022-03-09 DIAGNOSIS — Z7984 Long term (current) use of oral hypoglycemic drugs: Secondary | ICD-10-CM | POA: Diagnosis not present

## 2022-03-09 DIAGNOSIS — H3581 Retinal edema: Secondary | ICD-10-CM | POA: Diagnosis not present

## 2022-03-09 DIAGNOSIS — E113553 Type 2 diabetes mellitus with stable proliferative diabetic retinopathy, bilateral: Secondary | ICD-10-CM | POA: Diagnosis not present

## 2022-03-09 DIAGNOSIS — H43391 Other vitreous opacities, right eye: Secondary | ICD-10-CM | POA: Diagnosis not present

## 2022-03-09 MED ORDER — MOXIFLOXACIN HCL 0.5 % OP SOLN
1.0000 [drp] | Freq: Four times a day (QID) | OPHTHALMIC | 2 refills | Status: DC
  Filled 2022-03-09 – 2022-03-10 (×3): qty 3, 15d supply, fill #0

## 2022-03-09 MED ORDER — BACITRACIN-POLYMYXIN B 500-10000 UNIT/GM OP OINT
TOPICAL_OINTMENT | Freq: Four times a day (QID) | OPHTHALMIC | 5 refills | Status: DC
  Filled 2022-03-09: qty 3.5, 10d supply, fill #0

## 2022-03-10 ENCOUNTER — Other Ambulatory Visit (HOSPITAL_COMMUNITY): Payer: Self-pay

## 2022-03-13 ENCOUNTER — Other Ambulatory Visit (HOSPITAL_COMMUNITY): Payer: Self-pay

## 2022-03-15 ENCOUNTER — Telehealth: Payer: Self-pay | Admitting: *Deleted

## 2022-03-15 ENCOUNTER — Telehealth: Payer: Self-pay | Admitting: Cardiovascular Disease

## 2022-03-15 NOTE — Telephone Encounter (Signed)
Patient with diagnosis of aflutter on Eliquis for anticoagulation.    Procedure:  Colonoscopy Date of procedure: 03/23/22   CHA2DS2-VASc Score = 4   This indicates a 4.8% annual risk of stroke. The patient's score is based upon: CHF History: 1 HTN History: 1 Diabetes History: 1 Stroke History: 0 Vascular Disease History: 1 Age Score: 0 Gender Score: 0      CrCl >100 ml/min  Per office protocol, patient can hold Eliquis for 1-2 days prior to procedure.

## 2022-03-15 NOTE — Telephone Encounter (Signed)
   Pre-operative Risk Assessment    Patient Name: [name] Merritt  DOB: 05/03/1971 MRN: [id number]      Request for Surgical Clearance    Procedure:   Colonoscopy  Date of Surgery:  Clearance 03/23/22                                 Surgeon:  Dr. Dulce Sellar Surgeon's Group or Practice Name: [facility]  Phone number:  [phone] Fax number:  [phone]   Type of Clearance Requested:   - Medical  - Pharmacy:  Hold Clopidogrel (Plavix) and Apixaban (Eliquis)     Type of Anesthesia:  Not Indicated   Additional requests/questions:   Please advise surgeon/provider what medications should be held. Please fax a copy of Clearance to the surgeon's office.   Signed, April Henson   03/15/2022, 8:45 AM

## 2022-03-15 NOTE — Telephone Encounter (Signed)
Pt has been scheduled for a tele pre op appt 03/21/22 @ 10 am. Med rec and consent are done.

## 2022-03-15 NOTE — Telephone Encounter (Signed)
[name] Merritt 51 year old is requesting preoperative cardiac evaluation for colonoscopy.  He was last seen in the clinic on 12/28/2021.  During that time he felt well.  He was compliant with his CPAP.  He denied chest pain.  He had lost around 6 pounds.   His PMH includes anxiety, CHF, CAD, type 2 diabetes, GERD, hyperlipidemia, HTN, and OSA on CPAP.  He also underwent cardioversion 06/10/2020.  He had a recurrent episode of chest tightness 05/29/2020.  He was diagnosed with ST elevation.  He underwent cardiac catheterization which showed a total occlusion of his mid LAD.  He was noted to have 70% first diagonal stenosis, 20% narrowing beyond sharp bend in the ramus intermedius, 50% stenosis of OM 3 and 70% stenosis in the mid PLA branch.  He received PCI with DES to his mid LAD.  His echocardiogram showed an LVEF of 35-40% and G2 DD.  May his Plavix be held prior to his procedure?  Please direct your response to CV DIV preop pool.  Thank you for your help.  Jossie Ng. Ellwood Steidle NP-C    03/15/2022, 3:15 PM [place] [address] [place] [address] Office [phone] Fax [phone]

## 2022-03-15 NOTE — Telephone Encounter (Signed)
Pt has been scheduled for a tele pre op appt 03/21/22 @ 10 am. Med rec and consent are done.     Patient Consent for Virtual Visit        [name] Merritt has provided verbal consent on 03/15/2022 for a virtual visit (video or telephone).   CONSENT FOR VIRTUAL VISIT FOR:  [name] Merritt  By participating in this virtual visit I agree to the following:  I hereby voluntarily request, consent and authorize [facility] and its employed or contracted physicians, Engineer, materials, nurse practitioners or other licensed health care professionals (the Practitioner), to provide me with telemedicine health care services (the "Services") as deemed necessary by the treating Practitioner. I acknowledge and consent to receive the Services by the Practitioner via telemedicine. I understand that the telemedicine visit will involve communicating with the Practitioner through live audiovisual communication technology and the disclosure of certain medical information by electronic transmission. I acknowledge that I have been given the opportunity to request an in-person assessment or other available alternative prior to the telemedicine visit and am voluntarily participating in the telemedicine visit.  I understand that I have the right to withhold or withdraw my consent to the use of telemedicine in the course of my care at any time, without affecting my right to future care or treatment, and that the Practitioner or I may terminate the telemedicine visit at any time. I understand that I have the right to inspect all information obtained and/or recorded in the course of the telemedicine visit and may receive copies of available information for a reasonable fee.  I understand that some of the potential risks of receiving the Services via telemedicine include:  Delay or interruption in medical evaluation due to technological equipment failure or disruption; Information transmitted may not be sufficient (e.g.  poor resolution of images) to allow for appropriate medical decision making by the Practitioner; and/or  In rare instances, security protocols could fail, causing a breach of personal health information.  Furthermore, I acknowledge that it is my responsibility to provide information about my medical history, conditions and care that is complete and accurate to the best of my ability. I acknowledge that Practitioner's advice, recommendations, and/or decision may be based on factors not within their control, such as incomplete or inaccurate data provided by me or distortions of diagnostic images or specimens that may result from electronic transmissions. I understand that the practice of medicine is not an exact science and that Practitioner makes no warranties or guarantees regarding treatment outcomes. I acknowledge that a copy of this consent can be made available to me via my patient portal ([facility]), or I can request a printed copy by calling the office of [facility].    I understand that my insurance will be billed for this visit.   I have read or had this consent read to me. I understand the contents of this consent, which adequately explains the benefits and risks of the Services being provided via telemedicine.  I have been provided ample opportunity to ask questions regarding this consent and the Services and have had my questions answered to my satisfaction. I give my informed consent for the services to be provided through the use of telemedicine in my medical care

## 2022-03-15 NOTE — Telephone Encounter (Signed)
Calling back ask bout clopidogrel (PLAVIX) 75 MG tablet. States it wasn't address in the notes. Please advise

## 2022-03-15 NOTE — Telephone Encounter (Signed)
Pharmacy has addressed Eliquis hold.  Preoperative team, please contact this patient and set up a phone call appointment for further cardiac evaluation.  Thank you for your help.  Jossie Ng. Remedios Mckone NP-C    03/15/2022, 11:16 AM [address] [place] [address] Office [phone] Fax [phone]

## 2022-03-17 DIAGNOSIS — M6289 Other specified disorders of muscle: Secondary | ICD-10-CM | POA: Diagnosis not present

## 2022-03-17 NOTE — Telephone Encounter (Signed)
Spoke with requesting office.  Due to time of follow-up visit and amount of time needed to hold dual antiplatelet therapy there was concern.  I gave him permission to hold clopidogrel and start aspirin 81 mg daily.  Patient will resume clopidogrel and discontinue 81 mg aspirin after procedure.  Jossie Ng. Iyanla Eilers NP-C    03/17/2022, 4:26 PM [facility] [place] [address] Office [phone] Fax [phone]

## 2022-03-17 NOTE — Telephone Encounter (Signed)
[name] Merritt with [name] Merritt is following up. She states 5/30 telehealth visit will not be enough time to determine whether or not patient can hold blood thinner. Procedure is scheduled for 6/01, but patient's typically need to hold blood thinners 5 days in advance  Please return call to [facility] to discuss at [phone].

## 2022-03-17 NOTE — Telephone Encounter (Signed)
Per Pre op provider: I called [facility]. I had to have them put him on 14 ASA  We will continue with plan of care at this time. Pt will have a tele preop appt 03/21/22 for final assessment for clearance.

## 2022-03-17 NOTE — Telephone Encounter (Signed)
Dr. Tresa Endo, please advise on holding Plavix prior to procedure.  Thank you for your help.  Please direct your response to CV DIV preop pool.  Patient's procedure is scheduled for 03/23/2022.  Thomasene Ripple. Mallie Giambra NP-C    03/17/2022, 2:09 PM [facility] [address] Office [phone] Fax [phone]

## 2022-03-21 ENCOUNTER — Other Ambulatory Visit (HOSPITAL_COMMUNITY): Payer: Self-pay

## 2022-03-21 ENCOUNTER — Ambulatory Visit (INDEPENDENT_AMBULATORY_CARE_PROVIDER_SITE_OTHER): Payer: 59 | Admitting: Physician Assistant

## 2022-03-21 DIAGNOSIS — Z01810 Encounter for preprocedural cardiovascular examination: Secondary | ICD-10-CM | POA: Diagnosis not present

## 2022-03-21 MED ORDER — PEG 3350-KCL-NA BICARB-NACL 420 G PO SOLR
ORAL | 0 refills | Status: AC
Start: 2021-11-30 — End: ?
  Filled 2022-03-21: qty 4000, 1d supply, fill #0

## 2022-03-21 NOTE — Progress Notes (Signed)
Virtual Visit via Telephone Note   Because of [name]'s co-morbid illnesses, he is at least at moderate risk for complications without adequate follow up.  This format is felt to be most appropriate for this patient at this time.  The patient did not have access to video technology/had technical difficulties with video requiring transitioning to audio format only (telephone).  All issues noted in this document were discussed and addressed.  No physical exam could be performed with this format.  Please refer to the patient's chart for his consent to telehealth for [name].  Evaluation Performed:  Preoperative cardiovascular risk assessment _____________   Date:  03/21/2022   Patient ID:  [name], DOB 01-02-1971, MRN [id number] Patient Location:  Home Provider location:   Office  Primary Care Provider:  Renford Dills, MD Primary Cardiologist:  Nicki Guadalajara, MD  Chief Complaint / Patient Profile   51 y.o. y/o male with a h/o hypertension, hyperlipidemia, insulin-dependent DM, OSA and a history of CAD who is pending colonoscopy and presents today for telephonic preoperative cardiovascular risk assessment.  Past Medical History    Past Medical History:  Diagnosis Date   Anxiety    CHF (congestive heart failure) (HCC)    Coronary artery disease    Diabetes mellitus without complication (HCC)    type II   GERD (gastroesophageal reflux disease)    not currently   High cholesterol    Hypertension    Myocardial infarction (HCC) 05/30/2020   New onset atrial flutter (HCC) 06/09/2020   Overweight    Sleep apnea    Past Surgical History:  Procedure Laterality Date   CARDIOVERSION N/A 06/10/2020   Procedure: CARDIOVERSION;  Surgeon: Little Ishikawa, MD;  Location: [facility];  Service: Cardiovascular;  Laterality: N/A;   CORONARY STENT INTERVENTION N/A 05/30/2020   Procedure: CORONARY STENT INTERVENTION;  Surgeon: Lennette Bihari, MD;  Location: [facility]  INVASIVE CV LAB;  Service: Cardiovascular;  Laterality: N/A;   CORONARY/GRAFT ACUTE MI REVASCULARIZATION N/A 05/30/2020   Procedure: Coronary/Graft Acute MI Revascularization;  Surgeon: Lennette Bihari, MD;  Location: [facility] INVASIVE CV LAB;  Service: Cardiovascular;  Laterality: N/A;   GAS INSERTION Right 07/19/2021   Procedure: INSERTION OF GAS - C3F8;  Surgeon: Carmela Rima, MD;  Location: [facility] OR;  Service: Ophthalmology;  Laterality: Right;   GAS/FLUID EXCHANGE Right 07/19/2021   Procedure: GAS/FLUID EXCHANGE;  Surgeon: Carmela Rima, MD;  Location: [facility] OR;  Service: Ophthalmology;  Laterality: Right;   LEFT HEART CATH AND CORONARY ANGIOGRAPHY N/A 05/30/2020   Procedure: LEFT HEART CATH AND CORONARY ANGIOGRAPHY;  Surgeon: Lennette Bihari, MD;  Location: [facility] INVASIVE CV LAB;  Service: Cardiovascular;  Laterality: N/A;   MEMBRANE PEEL Right 07/19/2021   Procedure: MEMBRANE PEEL;  Surgeon: Carmela Rima, MD;  Location: [facility] OR;  Service: Ophthalmology;  Laterality: Right;   PARS PLANA VITRECTOMY 27 GAUGE Right 09/27/2021   Procedure: PARS PLANA VITRECTOMY 27 GAUGE;  Surgeon: Carmela Rima, MD;  Location: [facility] OR;  Service: Ophthalmology;  Laterality: Right;   PHOTOCOAGULATION WITH LASER Right 07/19/2021   Procedure: PHOTOCOAGULATION WITH LASER;  Surgeon: Carmela Rima, MD;  Location: [facility] OR;  Service: Ophthalmology;  Laterality: Right;   PHOTOCOAGULATION WITH LASER Right 09/27/2021   Procedure: PHOTOCOAGULATION WITH LASER;  Surgeon: Carmela Rima, MD;  Location: [facility];  Service: Ophthalmology;  Laterality: Right;   REPAIR OF COMPLEX TRACTION RETINAL DETACHMENT Right 07/19/2021   Procedure: REPAIR OF COMPLEX TRACTION RETINAL DETACHMENT;  Surgeon: Carmela Rima, MD;  Location: [facility] OR;  Service: Ophthalmology;  Laterality: Right;   TEE WITHOUT CARDIOVERSION N/A 06/10/2020   Procedure: TRANSESOPHAGEAL ECHOCARDIOGRAM (TEE);  Surgeon: Little Ishikawa, MD;  Location: [facility] ENDOSCOPY;  Service:  Cardiovascular;  Laterality: N/A;   torn rotator cuff  2018   2018    Allergies  Allergies  Allergen Reactions   Pineapple Itching and Other (See Comments)    All-over itching (mouth, too)   Tomato Itching and Other (See Comments)    All-over itching (mouth, too) Ketchup, also   Rosuvastatin Calcium Hives and Rash    History of Present Illness    [name] is a 51 y.o. male who presents via audio/video conferencing for a telehealth visit today.  Pt was last seen in cardiology clinic on 12/28/2021 by Dr. Tresa Endo.  At that time [name] [facility] was doing well.  The patient is now pending procedure as outlined above. Since his last visit, he has been doing well without any exertional chest pain or worsening shortness of breath.   Home Medications    Prior to Admission medications   Medication Sig Start Date End Date Taking? Authorizing Provider  acetaminophen (TYLENOL) 650 MG CR tablet Take 1,300 mg by mouth every 8 (eight) hours as needed for pain.    [provider]  albuterol (VENTOLIN HFA) 108 (90 Base) MCG/ACT inhaler Inhale 2 puffs into the lungs every 6 (six) hours as needed for wheezing or shortness of breath.    [provider]  ALPRAZolam Prudy Feeler) 0.25 MG tablet Take 1 tablet (0.25 mg total) by mouth daily as needed. 10/05/21     amLODipine (NORVASC) 5 MG tablet Take 1 tablet (5 mg total) by mouth daily. 12/28/21   Lennette Bihari, MD  apixaban (ELIQUIS) 5 MG TABS tablet TAKE 1 TABLET BY MOUTH TWO TIMES DAILY 08/29/21 08/29/22  Azalee Course, PA  Ascorbic Acid (VITAMIN C) 1000 MG tablet Take 1,000 mg by mouth daily.    [provider]  atorvastatin (LIPITOR) 80 MG tablet Take 1 tablet (80 mg total) by mouth daily at 6 PM. 07/01/20   Azalee Course, PA  bacitracin-polymyxin b (POLYSPORIN) ophthalmic ointment Place into the right eye 4 (four) times daily for 10 days 03/09/22     cetirizine (ZYRTEC) 10 MG tablet TAKE 1 TABLET(10 MG) BY MOUTH DAILY 08/16/20   Charlott Holler, MD  CINNAMON PO Take 1,500 mg by mouth 2 (two) times daily.    [provider]  clopidogrel (PLAVIX) 75 MG tablet Take 1 tablet (75 mg total) by mouth daily. 06/16/21   Georgie Chard D, NP  dapagliflozin propanediol (FARXIGA) 10 MG TABS tablet Take 1 tablet (10 mg total) by mouth daily. 11/17/21   Lennette Bihari, MD  Evolocumab (REPATHA SURECLICK) 140 MG/ML SOAJ Inject 1 Dose into the skin every 14 (fourteen) days. 03/02/21   Hilty, Lisette Abu, MD  fluticasone (FLONASE) 50 MCG/ACT nasal spray SHAKE LIQUID AND USE 1 SPRAY IN EACH NOSTRIL DAILY Patient not taking: Reported on 12/28/2021 05/25/20   Charlott Holler, MD  insulin glargine-yfgn (SEMGLEE, YFGN,) 100 UNIT/ML Pen Inject 55 Units into the skin daily or as directed. Patient not taking: Reported on 12/28/2021 02/14/21     magnesium citrate SOLN Take 1 Bottle by mouth once as needed for mild constipation or moderate constipation.  Patient not taking: Reported on 12/28/2021    [provider]  Melatonin 10 MG TABS Take 10 mg by mouth at bedtime as needed (sleep). Patient  not taking: Reported on 12/28/2021    [provider]  metFORMIN (GLUCOPHAGE) 1000 MG tablet Take 1/2 tablet by mouth daily for 1 week then increase to 1 tab daily 09/23/21     metoprolol succinate (TOPROL-XL) 25 MG 24 hr tablet Take 2 tablets (50 mg total) by mouth daily. 12/20/21     moxifloxacin (VIGAMOX) 0.5 % ophthalmic solution Place 1 drop into the right eye 4 (four) times daily. 03/09/22     nitroGLYCERIN (NITROSTAT) 0.4 MG SL tablet Place 1 tablet (0.4 mg total) under the tongue every 5 (five) minutes as needed for chest pain. 12/28/21   Lennette Bihari, MD  ofloxacin (OCUFLOX) 0.3 % ophthalmic solution Place 1 drop into the right eye 4 (four) times daily for 1 week Patient not taking: Reported on 12/28/2021 09/28/21   Carmela Rima, MD  pantoprazole (PROTONIX) 40 MG tablet Take 1 tablet (40 mg total) by mouth 2 (two) times daily. 09/22/21      sacubitril-valsartan (ENTRESTO) 97-103 MG Take 1 tablet by mouth 2 (two) times daily. 11/17/21   Hilty, Lisette Abu, MD  sildenafil (REVATIO) 20 MG tablet Take 20 mg by mouth daily as needed for erectile dysfunction.    [provider]  spironolactone (ALDACTONE) 25 MG tablet Take 0.5 tablets (12.5 mg total) by mouth 2 (two) times daily. 11/17/21   Lennette Bihari, MD  traZODone (DESYREL) 100 MG tablet Take 1 tablet (100 mg total) by mouth at bedtime. 12/20/21     zinc gluconate 50 MG tablet Take 50 mg by mouth daily.    [provider]    Physical Exam    Vital Signs:  Garyn Mick does not have vital signs available for review today.  Given telephonic nature of communication, physical exam is limited. AAOx3. NAD. Normal affect.  Speech and respirations are unlabored.  Accessory Clinical Findings    None  Assessment & Plan    1.  Preoperative Cardiovascular Risk Assessment:  -Patient has upcoming colonoscopy procedure.  His last dose of Plavix and Eliquis was on Sunday 03/19/2022.  He denies any recent chest pain or worsening shortness of breath.  Colonoscopy is considered a low risk procedure, he is cleared to proceed.  Although we typically recommend Plavix holding time of 5 days and Eliquis for 2 days, patient has been holding since Sunday, I recommended continue to hold Plavix and Eliquis at this time until after the procedure.  Patient may restart both medication as soon as possible afterward at his GI doctor's discretion after the procedure.  -Patient also mentions that he may ended up needing nerve conduction study because of bilateral arm weakness, if so, he is also cleared to proceed with nerve conduction study.  We will defer to the provider who does the nerve conduction study to decide if need to hold the blood thinner.  A copy of this note will be routed to requesting surgeon.  Time:   Today, I have spent 5 minutes with the patient with telehealth technology  discussing medical history, symptoms, and management plan.     Azalee Course, [place]  03/21/2022, 9:56 AM

## 2022-03-21 NOTE — Telephone Encounter (Signed)
See note from telemetry virtual visit on 03/21/2022, patient is cleared to proceed with GI procedure.    Note, patient says he may need upcoming nerve conduction study.  If nerve conduction study is needed, he is also cleared.  We will defer to the provider who does the nerve conduction study to decide if patient will need to hold Eliquis and Plavix.  If need to hold blood thinner, we can address the duration of holding time.

## 2022-03-23 DIAGNOSIS — K621 Rectal polyp: Secondary | ICD-10-CM | POA: Diagnosis not present

## 2022-03-23 DIAGNOSIS — Z1211 Encounter for screening for malignant neoplasm of colon: Secondary | ICD-10-CM | POA: Diagnosis not present

## 2022-03-23 DIAGNOSIS — K649 Unspecified hemorrhoids: Secondary | ICD-10-CM | POA: Diagnosis not present

## 2022-03-27 ENCOUNTER — Other Ambulatory Visit (HOSPITAL_COMMUNITY): Payer: Self-pay

## 2022-03-30 ENCOUNTER — Other Ambulatory Visit: Payer: Self-pay | Admitting: Internal Medicine

## 2022-03-30 ENCOUNTER — Other Ambulatory Visit (HOSPITAL_COMMUNITY): Payer: Self-pay

## 2022-03-30 ENCOUNTER — Other Ambulatory Visit: Payer: Self-pay | Admitting: Physician Assistant

## 2022-03-30 DIAGNOSIS — E785 Hyperlipidemia, unspecified: Secondary | ICD-10-CM

## 2022-03-30 DIAGNOSIS — I2510 Atherosclerotic heart disease of native coronary artery without angina pectoris: Secondary | ICD-10-CM

## 2022-03-30 DIAGNOSIS — I4892 Unspecified atrial flutter: Secondary | ICD-10-CM

## 2022-03-30 DIAGNOSIS — I2102 ST elevation (STEMI) myocardial infarction involving left anterior descending coronary artery: Secondary | ICD-10-CM

## 2022-03-31 ENCOUNTER — Other Ambulatory Visit (HOSPITAL_COMMUNITY): Payer: Self-pay

## 2022-03-31 MED ORDER — APIXABAN 5 MG PO TABS
5.0000 mg | ORAL_TABLET | Freq: Two times a day (BID) | ORAL | 1 refills | Status: DC
  Filled 2022-03-31: qty 180, 90d supply, fill #0
  Filled 2022-08-20: qty 180, 90d supply, fill #1

## 2022-03-31 MED ORDER — REPATHA SURECLICK 140 MG/ML ~~LOC~~ SOAJ
1.0000 | SUBCUTANEOUS | 3 refills | Status: DC
  Filled 2022-03-31: qty 6, 84d supply, fill #0
  Filled 2022-07-27: qty 6, 84d supply, fill #1
  Filled 2022-10-29 – 2022-10-30 (×2): qty 6, 84d supply, fill #2
  Filled 2023-02-03 – 2023-02-05 (×3): qty 6, 84d supply, fill #3

## 2022-03-31 NOTE — Telephone Encounter (Signed)
Prescription refill request for Eliquis received. Indication: Aflutter Last office visit:03/21/22 [facility] Post)  Scr: 1.17 (12/07/21) Age: 51 Weight: 145.3kg  Appropriate dose and refill sent to requested pharmacy.

## 2022-04-10 DIAGNOSIS — E113512 Type 2 diabetes mellitus with proliferative diabetic retinopathy with macular edema, left eye: Secondary | ICD-10-CM | POA: Diagnosis not present

## 2022-04-14 ENCOUNTER — Other Ambulatory Visit (HOSPITAL_COMMUNITY): Payer: Self-pay

## 2022-04-18 DIAGNOSIS — H25813 Combined forms of age-related cataract, bilateral: Secondary | ICD-10-CM | POA: Diagnosis not present

## 2022-04-18 DIAGNOSIS — E113513 Type 2 diabetes mellitus with proliferative diabetic retinopathy with macular edema, bilateral: Secondary | ICD-10-CM | POA: Diagnosis not present

## 2022-04-18 DIAGNOSIS — H31093 Other chorioretinal scars, bilateral: Secondary | ICD-10-CM | POA: Diagnosis not present

## 2022-04-18 DIAGNOSIS — H3582 Retinal ischemia: Secondary | ICD-10-CM | POA: Diagnosis not present

## 2022-04-26 DIAGNOSIS — E113511 Type 2 diabetes mellitus with proliferative diabetic retinopathy with macular edema, right eye: Secondary | ICD-10-CM | POA: Diagnosis not present

## 2022-05-02 ENCOUNTER — Other Ambulatory Visit (HOSPITAL_COMMUNITY): Payer: Self-pay

## 2022-05-11 ENCOUNTER — Ambulatory Visit (INDEPENDENT_AMBULATORY_CARE_PROVIDER_SITE_OTHER): Payer: 59 | Admitting: Cardiovascular Disease

## 2022-05-11 ENCOUNTER — Encounter: Payer: Self-pay | Admitting: Cardiovascular Disease

## 2022-05-11 DIAGNOSIS — I2102 ST elevation (STEMI) myocardial infarction involving left anterior descending coronary artery: Secondary | ICD-10-CM | POA: Diagnosis not present

## 2022-05-11 DIAGNOSIS — G4733 Obstructive sleep apnea (adult) (pediatric): Secondary | ICD-10-CM | POA: Diagnosis not present

## 2022-05-11 DIAGNOSIS — I10 Essential (primary) hypertension: Secondary | ICD-10-CM

## 2022-05-11 DIAGNOSIS — E118 Type 2 diabetes mellitus with unspecified complications: Secondary | ICD-10-CM | POA: Diagnosis not present

## 2022-05-11 DIAGNOSIS — E785 Hyperlipidemia, unspecified: Secondary | ICD-10-CM | POA: Diagnosis not present

## 2022-05-11 NOTE — Progress Notes (Signed)
Cardiology Office Note    Date:  05/11/2022   ID:  [name] Merritt, DOB 1971/03/15, MRN [id number]  PCP:  Seward Carol, MD  Cardiologist:  Shelva Majestic, MD   4 month F/u cardiology/sleep evaluation    History of Present Illness:  [name] Merritt is a 51 y.o. male who has a history of obesity, hypertension, hyperlipidemia, insulin-dependent diabetes mellitus, as well as diagnosed, but not treated sleep apnea.  On May 29, 2020 he experienced recurrent intermittent episodes of chest tightness for the majority of the day.  His symptoms ultimately resolved.  In the early morning at 5 AM on that evening he developed severe chest pain and presented to the emergency room where ECG showed acute anterolateral ST segment elevation.  A code STEMI was activated.  He was taken emergently to the catheterization laboratory and was found to have total occlusion of the mid LAD with TIMI 0 flow and absence of collaterals.  There was concomitant CAD with tortuous first diagonal vessel with 70% narrowing beyond a sharp bend, 20% narrowing beyond a sharp bend in the ramus immediate vessel, 50% focal OM 3 stenosis in a dominant left circumflex coronary artery and 70% focal stenosis in the midportion of a small PLA branch.  He had a normal small nondominant RCA.  He underwent successful PCI to the mid LAD with ultimate insertion of a Resolute Onyx 3.0 x 22 mm stent postdilated to 3.25 mm with restoration of TIMI-3 flow. I had not seen him since his initial acute catheterization and intervention.  An echo Doppler study on January 30, 2020 showed an EF of 35 to 40% with akinesis of the mid septum extending to the apex consistent with his LAD infarction.  He had grade 2 diastolic dysfunction.  He was initially placed on metoprolol which was switched to carvedilol.  He was started on Entresto and high intensity statin therapy.  He also was enrolled in the AEGIS trial.  When he presented for his infusion as part of the AEGIS   Trial on June 08, 2020 he was noted to be in atrial flutter.  Earlier he had taken Sudafed for nasal congestion.  He was subsequently sent to the emergency room.  Carvedilol was switched back to metoprolol for improved rate control, he was started on Eliquis and Brilinta was changed to Plavix.  He underwent TEE DC cardioversion by Dr. Gilman Schmidt on June 10, 2020 and post procedure he was discharged on Plavix/Eliquis.  He was seen by Almyra Deforest, PA on July 01, 2020.  At that time, he was was stable and did not have any recurrent chest pain.  He was maintaining sinus rhythm.  With his echo Doppler showing EF of 35 to 40% Entresto was increased to 49/51 mg twice a day.  He was on insulin for diabetes mellitus.  Of note, prior to his initial MI, he had undergone a home sleep study on April 28, 2020 which demonstrated moderate overall sleep apnea with an AHI of 15.4 but he had significant oxygen desaturation to a nadir of 74%.  He spent 54.4 minutes of the test with saturation less than 89%.  Apparently there was never any follow-up of this and he never had been contacted by pulmonary for CPAP titration.    I saw him January 28, 2021 at which time he denied any recurrent chest pain.  He was on Entresto 49/51 mg twice a day, Farxiga 5 mg, metoprolol succinate 25 mg daily for his cardiomyopathy.  He is  on Plavix and Eliquis for anticoagulation.  He is on atorvastatin 80 mg daily for hyperlipidemia.  He continues to be on Lantus insulin.  During his initial office evaluation with me, he was significantly hypertensive with stage II hypertension and I recommended further titration of Entresto to 97/103 mg twice a day.  I also added spironolactone 12.5 mg daily to his medical regimen particularly with his blood pressure elevation and LV dysfunction.  I reviewed his sleep study from July 2021 which confirmed at least moderate overall sleep apnea but I was concerned with his significant oxygen desaturation to a nadir of 74%  perhaps suggesting more severe sleep apnea during REM sleep.  Unfortunately, since 9 months had elapsed since his initial evaluation it was required that he undergo a new assessment and recommended he undergo a split-night sleep study such that CPAP titration could be done at the same time.  I recommended follow-up laboratory.  I recommended a follow-up echo Doppler study following further titration of Entresto.   I last saw him on November 17, 2021.  Since his prior evaluation he felt improved with reference to not experiencing any anginal symptomatology.  His blood pressure has continued to be elevated despite taking Entresto 97/103 twice a day, Farxiga 5 mg daily, metoprolol succinate 25 mg daily and spironolactone 12.5 mg.  He is now on Repatha and atorvastatin 80 mg for hyperlipidemia.  His follow-up echo Doppler study on Mar 08, 2021 showed significant improvement in LV function now at 55 to 60% ejection fraction.  There were no wall motion abnormalities.  There was mild LVH and he had normal diastolic parameters.  There was mild aortic sclerosis without stenosis.  He underwent his split-night sleep evaluation on March 28, 2021.  This confirmed severe sleep apnea with an AHI of 35.5.  Events were very severe during REM sleep with an AHI of 91.4/h.  CPAP was initiated at 6 cm and was titrated to 14 cm water pressure AHI was 0 but this was without REM sleep.  Snoring persisted until 14 cm of water.  He initiated CPAP therapy on October 11, 2021 and received a ResMed air sense 11 AutoSet unit with [employer] as his DME company.  Initial download was obtained from October 18, 2021 through November 16, 2021 which showed extremely poor compliance with usage days and only 20%.  He essentially was not using therapy with average use at only 34 minutes.  His CPAP setting range was 12 to 18 cm and AHI was 1.0 with 95th percentile at 11.  However undoubtedly this data is unreliable due to very poor sleep efficiency and  certainly no REM sleep.  During that evaluation, I spent considerable time with him discussing the importance of sleep apnea treatment regarding his cardiovascular health and that potentially he is at risk if he is not compliant within a 90-day window that his CPAP may be taken back by his DME company.  I last saw him on December 28, 2021.  Since his prior evaluation he felt well.  He was  trying hard to use CPAP and admits that he feels better with use.  I obtained a new download from February 6 through December 27, 2021.  He is compliant with usage days at 87% but unfortunately he is still not compliant with usage greater than 4 hours which needs to be at least 70%.  His average use is 3 hours and 47 minutes per night.  However with therapy set at a minimum pressure  of 12 and maximum of 18, AHI is excellent at 2.3.  His 95th percentile pressure is 15.5 with maximum average pressure 16.  He denied any chest pain.  He has lost 6 pounds since his most recent evaluation.    Since I last saw him, he underwent colonoscopy and was found to have 2 small polyps.  He had continue to use CPAP.  However he recently has had several eye surgeries and has been getting injections into his eye every 2 weeks.  As a result of his full facemask, this is led to leak with irritation to his eyes and he also had had recent corneal abrasion.  Apparently he had gone to [employer].  It was suggested that he change to a nasal type mask.  As result of his issues, over the past month his use of CPAP has been minimal with only 1 day use.  He also has had issues with hand weakness and numbness of his lateral aspect of his third fourth and fifth fingers bilaterally suggesting ulnar nerve issues.  Apparently he is scheduled to undergo neurologic testing in the upcoming month.  He denies any chest pain.  He has been sleeping in a recliner.  He continues to be on amlodipine 5 mg, Entresto 97/103 mg twice a day, Farxiga 10 mg daily, metoprolol succinate 50 mg  and spironolactone 25 mg with his previous LV dysfunction which had improved.  He is on metformin in addition to [place] for diabetes.  He continues to be on atorvastatin 80 mg and Repatha for aggressive lipid-lowering therapy.  LDL cholesterol in February 2023 was 79.  He presents for evaluation.   Past Medical History:  Diagnosis Date   Anxiety    CHF (congestive heart failure) ([place])    Coronary artery disease    Diabetes mellitus without complication ([place])    type II   GERD (gastroesophageal reflux disease)    not currently   High cholesterol    Hypertension    Myocardial infarction ([place]) 05/30/2020   New onset atrial flutter ([place]) 06/09/2020   Overweight    Sleep apnea     Past Surgical History:  Procedure Laterality Date   CARDIOVERSION N/A 06/10/2020   Procedure: CARDIOVERSION;  Surgeon: Donato Heinz, MD;  Location: [facility] ENDOSCOPY;  Service: Cardiovascular;  Laterality: N/A;   CORONARY STENT INTERVENTION N/A 05/30/2020   Procedure: CORONARY STENT INTERVENTION;  Surgeon: Troy Sine, MD;  Location: [place] CV LAB;  Service: Cardiovascular;  Laterality: N/A;   CORONARY/GRAFT ACUTE MI REVASCULARIZATION N/A 05/30/2020   Procedure: Coronary/Graft Acute MI Revascularization;  Surgeon: Troy Sine, MD;  Location: [place] CV LAB;  Service: Cardiovascular;  Laterality: N/A;   GAS INSERTION Right 07/19/2021   Procedure: INSERTION OF GAS - C3F8;  Surgeon: Jalene Mullet, MD;  Location: [place];  Service: Ophthalmology;  Laterality: Right;   GAS/FLUID EXCHANGE Right 07/19/2021   Procedure: GAS/FLUID EXCHANGE;  Surgeon: Jalene Mullet, MD;  Location: [place];  Service: Ophthalmology;  Laterality: Right;   LEFT HEART CATH AND CORONARY ANGIOGRAPHY N/A 05/30/2020   Procedure: LEFT HEART CATH AND CORONARY ANGIOGRAPHY;  Surgeon: Troy Sine, MD;  Location: [place] CV LAB;  Service: Cardiovascular;  Laterality: N/A;   MEMBRANE PEEL Right 07/19/2021   Procedure: MEMBRANE  PEEL;  Surgeon: Jalene Mullet, MD;  Location: [place];  Service: Ophthalmology;  Laterality: Right;   PARS PLANA VITRECTOMY 27 GAUGE Right 09/27/2021   Procedure: PARS PLANA VITRECTOMY 27 GAUGE;  Surgeon:  Jalene Mullet, MD;  Location: [place];  Service: Ophthalmology;  Laterality: Right;   PHOTOCOAGULATION WITH LASER Right 07/19/2021   Procedure: PHOTOCOAGULATION WITH LASER;  Surgeon: Jalene Mullet, MD;  Location: [place];  Service: Ophthalmology;  Laterality: Right;   PHOTOCOAGULATION WITH LASER Right 09/27/2021   Procedure: PHOTOCOAGULATION WITH LASER;  Surgeon: Jalene Mullet, MD;  Location: [place];  Service: Ophthalmology;  Laterality: Right;   REPAIR OF COMPLEX TRACTION RETINAL DETACHMENT Right 07/19/2021   Procedure: REPAIR OF COMPLEX TRACTION RETINAL DETACHMENT;  Surgeon: Jalene Mullet, MD;  Location: [place];  Service: Ophthalmology;  Laterality: Right;   TEE WITHOUT CARDIOVERSION N/A 06/10/2020   Procedure: TRANSESOPHAGEAL ECHOCARDIOGRAM (TEE);  Surgeon: Donato Heinz, MD;  Location: [facility] ENDOSCOPY;  Service: Cardiovascular;  Laterality: N/A;   torn rotator cuff  2018   2018    Current Medications: Outpatient Medications Prior to Visit  Medication Sig Dispense Refill   acetaminophen (TYLENOL) 650 MG CR tablet Take 1,300 mg by mouth every 8 (eight) hours as needed for pain.     albuterol (VENTOLIN HFA) 108 (90 Base) MCG/ACT inhaler Inhale 2 puffs into the lungs every 6 (six) hours as needed for wheezing or shortness of breath.     ALPRAZolam (XANAX) 0.25 MG tablet Take 1 tablet (0.25 mg total) by mouth daily as needed. 20 tablet 0   amLODipine (NORVASC) 5 MG tablet Take 1 tablet (5 mg total) by mouth daily. 30 tablet 5   apixaban (ELIQUIS) 5 MG TABS tablet Take 1 tablet (5 mg total) by mouth 2 (two) times daily. 180 tablet 1   Ascorbic Acid (VITAMIN C) 1000 MG tablet Take 1,000 mg by mouth daily.     atorvastatin (LIPITOR) 80 MG tablet Take 1 tablet (80 mg total) by mouth daily at  6 PM. 90 tablet 3   bacitracin-polymyxin b (POLYSPORIN) ophthalmic ointment Place into the right eye 4 (four) times daily for 10 days 3.5 g 5   cetirizine (ZYRTEC) 10 MG tablet TAKE 1 TABLET(10 MG) BY MOUTH DAILY 30 tablet 5   CINNAMON PO Take 1,500 mg by mouth 2 (two) times daily.     clopidogrel (PLAVIX) 75 MG tablet Take 1 tablet (75 mg total) by mouth daily. 90 tablet 2   dapagliflozin propanediol (FARXIGA) 10 MG TABS tablet Take 1 tablet (10 mg total) by mouth daily. 30 tablet 11   Evolocumab (REPATHA SURECLICK) 601 MG/ML SOAJ Inject 1 Dose into the skin every 14 (fourteen) days. 6 mL 3   fluticasone (FLONASE) 50 MCG/ACT nasal spray SHAKE LIQUID AND USE 1 SPRAY IN EACH NOSTRIL DAILY 16 g 2   insulin glargine-yfgn (SEMGLEE, YFGN,) 100 UNIT/ML Pen Inject 55 Units into the skin daily or as directed. 45 mL 3   magnesium citrate SOLN Take 1 Bottle by mouth once as needed for mild constipation or moderate constipation.     Melatonin 10 MG TABS Take 10 mg by mouth at bedtime as needed (sleep).     metFORMIN (GLUCOPHAGE) 1000 MG tablet Take 1/2 tablet by mouth daily for 1 week then increase to 1 tab daily 90 tablet 3   metoprolol succinate (TOPROL-XL) 25 MG 24 hr tablet Take 2 tablets (50 mg total) by mouth daily. 180 tablet 3   moxifloxacin (VIGAMOX) 0.5 % ophthalmic solution Place 1 drop into the right eye 4 (four) times daily. 3 mL 2   nitroGLYCERIN (NITROSTAT) 0.4 MG SL tablet Place 1 tablet (0.4 mg total) under the tongue every 5 (five) minutes  as needed for chest pain. 25 tablet 3   ofloxacin (OCUFLOX) 0.3 % ophthalmic solution Place 1 drop into the right eye 4 (four) times daily for 1 week 5 mL 0   pantoprazole (PROTONIX) 40 MG tablet Take 1 tablet (40 mg total) by mouth 2 (two) times daily. 180 tablet 3   polyethylene glycol-electrolytes (NULYTELY) 420 g solution Use as directed 4000 mL 0   sacubitril-valsartan (ENTRESTO) 97-103 MG Take 1 tablet by mouth 2 (two) times daily. 60 tablet 6    sildenafil (REVATIO) 20 MG tablet Take 20 mg by mouth daily as needed for erectile dysfunction.     spironolactone (ALDACTONE) 25 MG tablet Take 0.5 tablets (12.5 mg total) by mouth 2 (two) times daily. 30 tablet 11   traZODone (DESYREL) 100 MG tablet Take 1 tablet (100 mg total) by mouth at bedtime. 90 tablet 3   zinc gluconate 50 MG tablet Take 50 mg by mouth daily.     No facility-administered medications prior to visit.     Allergies:   Pineapple, Tomato, and Rosuvastatin calcium   Social History   Socioeconomic History   Marital status: Married    Spouse name: Not on file   Number of children: Not on file   Years of education: Not on file   Highest education level: Not on file  Occupational History   Occupation: maintenance  Tobacco Use   Smoking status: Never   Smokeless tobacco: Former    Types: Chew    Quit date: 2017  Vaping Use   Vaping Use: Never used  Substance and Sexual Activity   Alcohol use: Not Currently   Drug use: Not Currently    Types: Marijuana    Comment: 2006- last time   Sexual activity: Not on file  Other Topics Concern   Not on file  Social History Narrative   Not on file   Social Determinants of Health   Financial Resource Strain: Not on file  Food Insecurity: Not on file  Transportation Needs: Not on file  Physical Activity: Not on file  Stress: Not on file  Social Connections: Not on file    Socially, he is married most recently for 2 years.  He has 6 boys and a daughter.  He previously was working as a Therapist, occupational.  Family History:  The patient's family history includes Diabetes in his father, maternal grandfather, maternal grandmother, mother, paternal grandfather, and paternal grandmother; Hypertension in his father, maternal grandfather, maternal grandmother, mother, paternal grandfather, and paternal grandmother.   His father is 43 and has dementia and had a history of MI.  Mother is 71 and has history of  MI.  ROS General: Negative; No fevers, chills, or night sweats;  HEENT: Negative; No changes in vision or hearing, sinus congestion, difficulty swallowing Pulmonary: Negative; No cough, wheezing, shortness of breath, hemoptysis Cardiovascular: Positive for hypertension, hyperlipidemia, CAD,. no recurrent chest pain. GI: Negative; No nausea, vomiting, diarrhea, or abdominal pain GU: Negative; No dysuria, hematuria, or difficulty voiding Musculoskeletal: Negative; no myalgias, joint pain, or weakness Hematologic/Oncology: Negative; no easy bruising, bleeding Endocrine: Negative; no heat/cold intolerance; no diabetes Neuro: Negative; no changes in balance, headaches Skin: Negative; No rashes or skin lesions Psychiatric: Negative; No behavioral problems, depression Sleep: Positive for snoring, OSA on home sleep study; no bruxism, restless legs, hypnogognic hallucinations, no cataplexy Other comprehensive 14 point system review is negative.   PHYSICAL EXAM:   VS:  BP 134/88 (BP Location: Right Arm, Patient Position: Sitting,  Cuff Size: Large)   Pulse (!) 52   Ht $R'6\' 1"'Yb$  (1.854 m)   Wt (!) 304 lb (137.9 kg)   BMI 40.11 kg/m     Repeat blood pressure by me was 126/82  Wt Readings from Last 3 Encounters:  05/11/22 (!) 304 lb (137.9 kg)  12/28/21 (!) 320 lb 6.4 oz (145.3 kg)  11/17/21 (!) 328 lb 12.8 oz (149.1 kg)    General: Alert, oriented, no distress.  He has had purposeful weight loss of approximately 20 pounds over the past 4 months Skin: normal turgor, no rashes, warm and dry HEENT: Normocephalic, atraumatic. Pupils equal round and reactive to light; sclera anicteric; extraocular muscles intact; Fundi ** Nose without nasal septal hypertrophy Mouth/Parynx benign; Mallinpatti scale 4 Neck: Thick neck no JVD, no carotid bruits; normal carotid upstroke Lungs: clear to ausculatation and percussion; no wheezing or rales Chest wall: without tenderness to palpitation Heart: PMI not  displaced, RRR, s1 s2 normal, 1/6 systolic murmur, no diastolic murmur, no rubs, gallops, thrills, or heaves Abdomen: soft, nontender; no hepatosplenomehaly, BS+; abdominal aorta nontender and not dilated by palpation. Back: no CVA tenderness Pulses 2+ Musculoskeletal: full range of motion, normal strength, no joint deformities Extremities: no clubbing cyanosis or edema, Homan's sign negative  Neurologic: grossly nonfocal; Cranial nerves grossly wnl Psychologic: Normal mood and affect    Studies/Labs Reviewed:   May 11, 2022 ECG (independently read by me): Sinus bradycardia at 52 PRWP anteriorly, Q III aVF  December 28, 2021 ECG (independently read by me): NSR at 62; Inferior and anterior Q waves; no ectopy, normal intervals  November 17, 2021 ECG (independently read by me): NSR at 65, Inferior  and anteriro Q waves ; no ectopy, normal intervals  January 28, 2021 ECG (independently read by me): Sinus bradycardia at 55; QS anteriorly c/w old Ant MI;  Recent Labs:    Latest Ref Rng & Units 12/07/2021    4:19 PM 09/27/2021    1:48 PM 09/27/2021   12:14 PM  BMP  Glucose 70 - 99 mg/dL 120  105  112   BUN 6 - 24 mg/dL $Remove'21  16  16   'peUHjwD$ Creatinine 0.76 - 1.27 mg/dL 1.17  0.90  0.97   BUN/Creat Ratio 9 - 20 18     Sodium 134 - 144 mmol/L 141  138  134   Potassium 3.5 - 5.2 mmol/L 5.2  4.4  6.1   Chloride 96 - 106 mmol/L 103  101  105   CO2 20 - 29 mmol/L 24   23   Calcium 8.7 - 10.2 mg/dL 9.6   8.8         Latest Ref Rng & Units 12/07/2021    4:19 PM 01/28/2021   10:00 AM 07/29/2020   10:24 AM  Hepatic Function  Total Protein 6.0 - 8.5 g/dL 7.2  7.0  6.9   Albumin 4.0 - 5.0 g/dL 4.6  4.2  4.3   AST 0 - 40 IU/L $Remov'23  17  15   'tMWoBm$ ALT 0 - 44 IU/L $Remov'23  18  13   'ffXImw$ Alk Phosphatase 44 - 121 IU/L 73  83  77   Total Bilirubin 0.0 - 1.2 mg/dL 0.3  0.5  0.6   Bilirubin, Direct 0.00 - 0.40 mg/dL   0.18        Latest Ref Rng & Units 09/27/2021    1:48 PM 07/19/2021    1:05 PM 01/28/2021   10:00 AM  CBC  WBC  4.0 - 10.5 K/uL  4.2  4.8   Hemoglobin 13.0 - 17.0 g/dL 15.6  14.2  14.2   Hematocrit 39.0 - 52.0 % 46.0  44.3  42.2   Platelets 150 - 400 K/uL  248  259    Lab Results  Component Value Date   MCV 96.9 07/19/2021   MCV 90 01/28/2021   MCV 92 07/01/2020   Lab Results  Component Value Date   TSH 2.440 01/28/2021   Lab Results  Component Value Date   HGBA1C 6.7 (H) 01/28/2021     BNP No results found for: "BNP"  ProBNP    Component Value Date/Time   PROBNP 267 (H) 01/28/2021 1000     Lipid Panel     Component Value Date/Time   CHOL 179 01/28/2021 1000   TRIG 52 01/28/2021 1000   HDL 45 01/28/2021 1000   CHOLHDL 4.0 01/28/2021 1000   CHOLHDL 5.1 05/30/2020 0952   VLDL 15 05/30/2020 0952   LDLCALC 124 (H) 01/28/2021 1000   LABVLDL 10 01/28/2021 1000     RADIOLOGY: No results found.   Additional studies/ records that were reviewed today include:   EMERGENT CATH/PCI: 05/30/2020 3rd Mrg lesion is 50% stenosed. LPDA lesion is 70% stenosed. LPAV-1 lesion is 30% stenosed. LPAV-2 lesion is 30% stenosed. 1st Diag lesion is 70% stenosed. Ramus lesion is 20% stenosed. Mid LAD lesion is 100% stenosed. Post intervention, there is a 0% residual stenosis. A stent was successfully placed.   Acute anterolateral ST segment elevation myocardial infarction secondary to total occlusion of the mid LAD with TIMI 0 flow and absence of collaterals.   Concomitant CAD with tortuous 1st diagonal vessel with 70% narrowing beyond a sharp bend in the vessel, 20% narrowing beyond a sharp bend in the ramus immediate vessel, and dominant left circumflex coronary artery with 50% focal OM 3 stenosis, distal 30% stenoses in the AV groove, and focal 70% stenosis in the midportion of a small PLA branch.  Normal small nondominant RCA.   LVEDP 12 millimeters Hg.    Successful PCI to the totally occluded mid LAD with ultimate insertion of a Resolute Onyx 3.0 x 22 mm DES stent postdilated to 3.25  mm with digital narrowing of 0% and restoration of TIMI-3 flow.   RECOMMENDATION: DAPT for minimum of 1 year.  Aggressive lipid-lowering therapy with titration of atorvastatin to 80 mg daily.  We will reinitiate ARB therapy with losartan initially at lower dose, add low-dose beta-blocker therapy, and obtain 2D echo Doppler study in a.m. optimal blood pressure control with target blood pressure less than 130/80 and ideal blood pressure less than 120/80.  We will diabetes control.  If patient has LV dysfunction, SGLT 2 inhibition can be considered.   ECHO 05/31/2020 IMPRESSIONS   1. Akinesis of the mid septum into the apex consistent with LAD  infarction. Contrast imaging shows no evidence of LV thrombus. Left  ventricular ejection fraction, by estimation, is 35 to 40%. The left  ventricle has moderately decreased function. The  left ventricle demonstrates regional wall motion abnormalities (see  scoring diagram/findings for description). Left ventricular diastolic  parameters are consistent with Grade II diastolic dysfunction  (pseudonormalization). Elevated left atrial pressure.   2. Right ventricular systolic function is normal. The right ventricular  size is normal. There is normal pulmonary artery systolic pressure. The  estimated right ventricular systolic pressure is 68.0 mmHg.   3. The mitral valve is grossly normal. Trivial mitral valve  regurgitation. No  evidence of mitral stenosis.   4. The aortic valve is tricuspid. Aortic valve regurgitation is not  visualized. Mild aortic valve sclerosis is present, with no evidence of  aortic valve stenosis.   5. The inferior vena cava is normal in size with greater than 50%  respiratory variability, suggesting right atrial pressure of 3 mmHg.   Conclusion(s)/Recommendation(s): Findings consistent with ischemic  cardiomyopathy.    TEE 06/10/2020 IMPRESSIONS   1. Left ventricular ejection fraction, by estimation, is 40 to 45%. The  left  ventricle has mildly decreased function. The left ventricle  demonstrates regional wall motion abnormalities. Apical hypokinesis.   2. Right ventricular systolic function is normal. The right ventricular  size is normal.   3. The mitral valve is normal in structure. Trivial mitral valve  regurgitation.   4. The aortic valve is tricuspid. Aortic valve regurgitation is not  visualized. No aortic stenosis is present.   5. No left atrial/left atrial appendage thrombus was detected.   03/06/2021 IMPRESSION 1. Apical hypokinesis with overall preserved LV systolic function.   2. Left ventricular ejection fraction, by estimation, is 55 to 60%. The  left ventricle has normal function. The left ventricle has no regional  wall motion abnormalities. There is mild left ventricular hypertrophy.  Left ventricular diastolic parameters  were normal.   3. Right ventricular systolic function is normal. The right ventricular  size is normal. There is normal pulmonary artery systolic pressure.   4. The mitral valve is normal in structure. No evidence of mitral valve  regurgitation. No evidence of mitral stenosis.   5. The aortic valve is tricuspid. Aortic valve regurgitation is not  visualized. Mild aortic valve sclerosis is present, with no evidence of  aortic valve stenosis.   6. The inferior vena cava is normal in size with greater than 50%  respiratory variability, suggesting right atrial pressure of 3 mmHg.     03/28/2021 CLINICAL INFORMATION Sleep Study Type: Split Night CPAP   Indication for sleep study: Congestive Heart Failure, Diabetes, Hypertension, Obesity, Re-Evaluation, Snoring, Witnessed Apneas   Epworth Sleepiness Score: 12   SLEEP STUDY TECHNIQUE As per the AASM Manual for the Scoring of Sleep and Associated Events v2.3 (April 2016) with a hypopnea requiring 4% desaturations.   The channels recorded and monitored were frontal, central and occipital EEG, electrooculogram (EOG),  submentalis EMG (chin), nasal and oral airflow, thoracic and abdominal wall motion, anterior tibialis EMG, snore microphone, electrocardiogram, and pulse oximetry. Continuous positive airway pressure (CPAP) was initiated when the patient met split night criteria and was titrated according to treat sleep-disordered breathing.   MEDICATIONS acetaminophen (TYLENOL) 650 MG CR tablet albuterol (VENTOLIN HFA) 108 (90 Base) MCG/ACT inhaler ALPRAZolam (XANAX) 0.25 MG tablet apixaban (ELIQUIS) 5 MG TABS tablet atorvastatin (LIPITOR) 80 MG tablet cetirizine (ZYRTEC) 10 MG tablet clopidogrel (PLAVIX) 75 MG tablet dapagliflozin propanediol (FARXIGA) 5 MG TABS table Evolocumab (REPATHA SURECLICK) 768 MG/ML SOAJ fluticasone (FLONASE) 50 MCG/ACT nasal spray ibuprofen (ADVIL) 800 MG tablet Insulin Glargine (BASAGLAR KWIKPEN) 100 UNIT/ML Insulin Glargine-yfgn (SEMGLEE, YFGN,) 100 UNIT/ML SOPN LANTUS SOLOSTAR 100 UNIT/ML Solostar Pen magnesium citrate SOLN metoprolol succinate (TOPROL-XL) 25 MG 24 hr tablet nitroGLYCERIN (NITROSTAT) 0.4 MG SL tablet pantoprazole (PROTONIX) 40 MG tablet sacubitril-valsartan (ENTRESTO) 97-103 MG spironolactone (ALDACTONE) 25 MG tablet terbinafine (LAMISIL) 250 MG tablet   Medications self-administered by patient taken the night of the study : PANTOPRAZOLE SODIUM, ELIQUIS, ENTRESTO, TYLENOL PM, INSULIN GLARGINE   RESPIRATORY PARAMETERS Diagnostic Total AHI (/hr):  35.5     RDI (/hr):         38.7     OA Index (/hr):            0.4       CA Index (/hr):      3.2 REM AHI (/hr):            91.4     NREM AHI (/hr):          31.3     Supine AHI (/hr):         36.5     Non-supine AHI (/hr):        33.6 Min O2 Sat (%):          79.0     Mean O2 (%):  91.7     Time below 88% (min):           8           Titration Optimal Pressure (cm):           14        AHI at Optimal Pressure (/hr):            0          Min O2 at Optimal Pressure (%):       93.0 Supine % at  Optimal (%):       0          Sleep % at Optimal (%):         98           SLEEP ARCHITECTURE The recording time for the entire night was 446.9 minutes.   During a baseline period of 167.4 minutes, the patient slept for 150.5 minutes in REM and nonREM, yielding a sleep efficiency of 89.9%%. Sleep onset after lights out was 2.5 minutes with a REM latency of 73.0 minutes. The patient spent 5.6%% of the night in stage N1 sleep, 87.4%% in stage N2 sleep, 0.0%% in stage N3 and 7% in REM.   During the titration period of 273.3 minutes, the patient slept for 226.5 minutes in REM and nonREM, yielding a sleep efficiency of 82.9%%. Sleep onset after CPAP initiation was 16.4 minutes with a REM latency of 41.0 minutes. The patient spent 5.1%% of the night in stage N1 sleep, 79.0%% in stage N2 sleep, 0.0%% in stage N3 and 15.9% in REM.   CARDIAC DATA The 2 lead EKG demonstrated sinus rhythm. The mean heart rate was 100.0 beats per minute. Other EKG findings include: None.   LEG MOVEMENT DATA The total Periodic Limb Movements of Sleep (PLMS) were 0. The PLMS index was 0.0 .   IMPRESSIONS - Severe obstructive sleep apnea occurred during the diagnostic portion of the study (AHI  35.5/h; RDI 38.7/h); events were very severe during REM sleep (AHI 91.4/h).  CPAP was initiated at 6 cm and was titrated to optimal PAP pressure (14 cm of water). AHI 0 at 14 cm without REM sleep, and 0.8/h at 12 cm with minimal REM sleep. Snoring persisted until 14 cm of water. - No significant central sleep apnea occurred during the diagnostic portion of the study (CAI 3.2/hour). - Mild oxygen desaturation was noted during the diagnostic portion of the study to a nadir of 79.0%. - The patient snored with loud snoring volume during the diagnostic portion of the study. - No cardiac abnormalities were noted during this study. - Clinically significant periodic limb movements did not occur during sleep.  DIAGNOSIS - Obstructive Sleep  Apnea (G47.33)   RECOMMENDATIONS - Recommend an initial trial of CPAP Auto therapy with EPR of 3 at 12 - 18 cm of water with heated humidification. A medium Wide Cushion size [employer] Respironics Minimal Museum/gallery curator Under Nose Frame (M) mask was used for the titration. - Effort should be made to optimize nasal and oropharyngeal patency. - Avoid alcohol, sedatives and other CNS depressants that may worsen sleep apnea and disrupt normal sleep architecture. - Sleep hygiene should be reviewed to assess factors that may improve sleep quality. - Weight management (BMI 42) and regular exercise should be initiated or continued. - Recommend a download in 30 days and sleep clinic evaluation after 4 weeks of therapy.  ASSESSMENT:    1. ST elevation myocardial infarction involving left anterior descending (LAD) coronary artery [facility]): August 2021   2. OSA (obstructive sleep apnea)   3. Primary hypertension   4. Hyperlipidemia with target LDL less than 70   5. Morbid obesity ([place])   6. Type 2 diabetes mellitus with complication, without long-term current use of insulin (HCC)     PLAN:  [name] Merritt is a 51 year old gentleman who has a history of morbid obesity, hypertension, hyperlipidemia, and previously untreated obstructive sleep apnea.  He developed severe chest pain and was awakened from sleep the morning May 30, 2020 and was found to have acute anterolateral STEMI secondary to total mid occlusion of his LAD.  He was found to have concomitant CAD and his LAD was successfully stented with a 3.0 x 22 mm Resolute Onyx DES stent postdilated to 3.25 mm with 100% occlusion being reduced to 0% and restoration of TIMI-3 flow.  He has LV dysfunction secondary to his LAD infarction with initial EF at 35 to 40% which subsequently improved several weeks later to 40 to 45%.  He had developed an episode of atrial flutter following Sudafed and ultimately was converted successfully back to sinus rhythm  with DC cardioversion.  At his office visit April 2022 he had stage II hypertension and I further titrated Entresto to 97/103 mg twice a day and added spironolactone 12.5 mg.  A subsequent echo Doppler study of May 2022 has  demonstrated normalization of LV function with EF at 55 to 60% with mild LVH and mild aortic sclerosis.  When seen in January 2023, his breathing was improved.  Blood pressure continues to be elevated and I recommended further titration of spironolactone to 12.5 mg twice a day and titration of Farxiga to 10 mg daily.  He continued to be on  metoprolol 50 mg, Entresto 97/103 mg twice a day.  He continues to be on Plavix/Eliquis for antiplatelet therapy/anticoagulation.  He is now on Repatha and atorvastatin 80 mg daily for his significant hyperlipidemia and most recent LDL cholesterol in November 2022 was 31.  Blood pressure today continues to be elevated and I am adding amlodipine 5 mg daily to his medical regimen. With reference to his sleep apnea, he required a new sleep evaluation for reassessment and this confirmed severe sleep apnea with an AHI of 35.5.  However events were very severe during REM sleep with AHI of 91.4 and he had significant oxygen desaturation to 79%.  Due to supply chain issues, he received his new ResMed air sense 11 CPAP unit on October 11, 2021.  During my November 17, 2021 evaluation I spent considerable time with him discussing adverse consequences of untreated sleep apnea with reference to his cardiovascular health and specifically  discussed its effects on hypertension, nocturnal arrhythmias, increased risk for atrial fibrillation, inflammation, increased insulin resistance, GERD, and potential for severe nocturnal hypoxemia contributing to nocturnal ischemia particularly since he had previously suffered a myocardial infarction.  His sleep hygiene was very poor and he was often not going to bed until 2 and 3:00 in the morning and waking up between 7 AM.  I  discussed optimal sleep duration at 7 and 9 hours in an adult.  He has made significantly improved strides in CPAP use but is still not compliant by virtue of daily usage greater than 4 hours.  I stressed the importance of going to bed earlier which he is now doing and that when he gets up he should not be up for 2 hours before going back to his recliner to sleep but rather put his CPAP mask on which will aid him in returning to sleep much more quickly.  With the severity of his sleep apnea I stressed the importance that he meet compliance standards so that his machine is not taken away from him by his DME company.  Since he subsequently met compliance standards, he continues to have his CPAP unit.  Unfortunately, over the past month use has been minimal with only 1 day.  He states he has had recent cataract surgery, surgery for detached retina, as well as corneal abrasion.  As result of this he has had eye irritation which has been exacerbated by his full facemask.  He did recently was seen at [facility] and it was recommended he try a nasal mask.  We do not have any samples presently in our office.  I have recommended that he have a ResMed AirFit N30i mask and will send a message to our sleep coordinator so that [facility] can supply him with this new mask.  I again stressed the importance of resumption of use.  His blood pressure today is stable on his guideline directed medical therapy.  He is not having any recurrent anginal symptomatology regarding his CAD.  He continues to be on atorvastatin 80 mg and Repatha for hyperlipidemia.  He has noticed tingling of his lateral aspect of his third finger fourth and fifth fingers bilaterally suggesting R ulnar nerve distribution.  He will be undergoing neurologic evaluation with nerve conduction velocities in the upcoming future.  He has lost approximately 20 pounds of the past 4 months.  I discussed the importance of continued weight loss since he still is morbidly obese with a  BMI at 40.1.  I will see him in 6 months for reevaluation or sooner as needed.      Medication Adjustments/Labs and Tests Ordered: Current medicines are reviewed at length with the patient today.  Concerns regarding medicines are outlined above.  Medication changes, Labs and Tests ordered today are listed in the Patient Instructions below. Patient Instructions  Medication Instructions:  Your Physician recommend you continue on your current medication as directed.    *If you need a refill on your cardiac medications before your next appointment, please call your pharmacy*   Lab Work: None ordered today   Testing/Procedures: None ordered today   Follow-Up: At [facility], you and your health needs are our priority.  As part of our continuing mission to provide you with exceptional heart care, we have created designated Provider Care Teams.  These Care Teams include your primary Cardiologist (physician) and Advanced Practice Providers (APPs -  Physician Assistants and Nurse Practitioners) who all work together to provide you  with the care you need, when you need it.  We recommend signing up for the patient portal called "MyChart".  Sign up information is provided on this After Visit Summary.  MyChart is used to connect with patients for Virtual Visits (Telemedicine).  Patients are able to view lab/test results, encounter notes, upcoming appointments, etc.  Non-urgent messages can be sent to your provider as well.   To learn more about what you can do with MyChart, go to [email or web address].    Your next appointment:   6 month(s)  The format for your next appointment:   In Person  Provider:   Shelva Majestic, MD {         Signed, Shelva Majestic, MD  05/11/2022 12:36 PM    [facility] [address] [place], [place], [place]  [address] Phone: [phone]

## 2022-05-11 NOTE — Patient Instructions (Signed)

## 2022-05-12 DIAGNOSIS — G4733 Obstructive sleep apnea (adult) (pediatric): Secondary | ICD-10-CM | POA: Diagnosis not present

## 2022-05-17 DIAGNOSIS — E113512 Type 2 diabetes mellitus with proliferative diabetic retinopathy with macular edema, left eye: Secondary | ICD-10-CM | POA: Diagnosis not present

## 2022-05-22 ENCOUNTER — Other Ambulatory Visit (HOSPITAL_COMMUNITY): Payer: Self-pay

## 2022-05-23 ENCOUNTER — Other Ambulatory Visit: Payer: Self-pay | Admitting: *Deleted

## 2022-05-23 DIAGNOSIS — E785 Hyperlipidemia, unspecified: Secondary | ICD-10-CM

## 2022-05-26 ENCOUNTER — Other Ambulatory Visit: Payer: Self-pay | Admitting: Cardiology

## 2022-05-29 ENCOUNTER — Other Ambulatory Visit: Payer: Self-pay | Admitting: Cardiology

## 2022-05-29 ENCOUNTER — Other Ambulatory Visit (HOSPITAL_COMMUNITY): Payer: Self-pay

## 2022-05-29 MED ORDER — CLOPIDOGREL BISULFATE 75 MG PO TABS
75.0000 mg | ORAL_TABLET | Freq: Every day | ORAL | 3 refills | Status: DC
  Filled 2022-05-29: qty 90, 90d supply, fill #0
  Filled 2022-09-15: qty 90, 90d supply, fill #1
  Filled 2023-01-08 (×2): qty 90, 90d supply, fill #2
  Filled 2023-03-20: qty 90, 90d supply, fill #3

## 2022-06-05 ENCOUNTER — Other Ambulatory Visit (HOSPITAL_COMMUNITY): Payer: Self-pay

## 2022-06-12 DIAGNOSIS — G5603 Carpal tunnel syndrome, bilateral upper limbs: Secondary | ICD-10-CM | POA: Diagnosis not present

## 2022-06-14 DIAGNOSIS — E113511 Type 2 diabetes mellitus with proliferative diabetic retinopathy with macular edema, right eye: Secondary | ICD-10-CM | POA: Diagnosis not present

## 2022-06-17 ENCOUNTER — Other Ambulatory Visit (HOSPITAL_COMMUNITY): Payer: Self-pay

## 2022-06-17 ENCOUNTER — Other Ambulatory Visit: Payer: Self-pay | Admitting: Internal Medicine

## 2022-06-19 ENCOUNTER — Other Ambulatory Visit (HOSPITAL_COMMUNITY): Payer: Self-pay

## 2022-06-19 DIAGNOSIS — Z961 Presence of intraocular lens: Secondary | ICD-10-CM | POA: Diagnosis not present

## 2022-06-19 DIAGNOSIS — H25812 Combined forms of age-related cataract, left eye: Secondary | ICD-10-CM | POA: Diagnosis not present

## 2022-06-19 DIAGNOSIS — E11319 Type 2 diabetes mellitus with unspecified diabetic retinopathy without macular edema: Secondary | ICD-10-CM | POA: Diagnosis not present

## 2022-06-19 DIAGNOSIS — H53469 Homonymous bilateral field defects, unspecified side: Secondary | ICD-10-CM | POA: Diagnosis not present

## 2022-06-19 DIAGNOSIS — H26491 Other secondary cataract, right eye: Secondary | ICD-10-CM | POA: Diagnosis not present

## 2022-06-19 MED ORDER — ENTRESTO 97-103 MG PO TABS
1.0000 | ORAL_TABLET | Freq: Two times a day (BID) | ORAL | 11 refills | Status: DC
  Filled 2022-06-19: qty 60, 30d supply, fill #0
  Filled 2022-07-22: qty 60, 30d supply, fill #1
  Filled 2022-08-24: qty 60, 30d supply, fill #2
  Filled 2022-09-25: qty 60, 30d supply, fill #3
  Filled 2022-10-29 – 2022-10-30 (×2): qty 60, 30d supply, fill #4
  Filled 2022-11-27: qty 60, 30d supply, fill #5
  Filled 2023-01-01: qty 60, 30d supply, fill #6
  Filled 2023-02-03 – 2023-02-05 (×2): qty 60, 30d supply, fill #7
  Filled 2023-03-12: qty 60, 30d supply, fill #8
  Filled 2023-04-09: qty 60, 30d supply, fill #9
  Filled 2023-05-14: qty 60, 30d supply, fill #10
  Filled 2023-06-11: qty 60, 30d supply, fill #11

## 2022-06-19 MED ORDER — METFORMIN HCL 1000 MG PO TABS
1000.0000 mg | ORAL_TABLET | Freq: Every day | ORAL | 3 refills | Status: DC
  Filled 2022-06-19: qty 90, 90d supply, fill #0

## 2022-06-20 ENCOUNTER — Other Ambulatory Visit (HOSPITAL_COMMUNITY): Payer: Self-pay

## 2022-06-20 ENCOUNTER — Telehealth: Payer: Self-pay | Admitting: Cardiovascular Disease

## 2022-06-20 ENCOUNTER — Other Ambulatory Visit: Payer: Self-pay | Admitting: Orthopedic Surgery

## 2022-06-20 ENCOUNTER — Other Ambulatory Visit: Payer: Self-pay | Admitting: Cardiovascular Disease

## 2022-06-20 DIAGNOSIS — G5603 Carpal tunnel syndrome, bilateral upper limbs: Secondary | ICD-10-CM | POA: Diagnosis not present

## 2022-06-20 DIAGNOSIS — G4733 Obstructive sleep apnea (adult) (pediatric): Secondary | ICD-10-CM

## 2022-06-20 DIAGNOSIS — G5623 Lesion of ulnar nerve, bilateral upper limbs: Secondary | ICD-10-CM | POA: Diagnosis not present

## 2022-06-20 NOTE — Telephone Encounter (Signed)
Will route to PharmD for rec's re: holding anticoagulation. [name], PA-C    06/20/2022 3:43 PM

## 2022-06-20 NOTE — Telephone Encounter (Signed)
   Pre-operative Risk Assessment    Patient Name: [name] Merritt  DOB: August 16, 1971 MRN: [id number]      Request for Surgical Clearance    Procedure:   Right Carpatunel Release Right Ulnar Nerve Decompression Cubical Tunnel with Possible Transposition  Date of Surgery:  Clearance 07/11/22                                 Surgeon:  Dr. Betha Loa Surgeon's Group or Practice Name:  [facility] Phone number:  [phone] Fax number:  [phone]   Type of Clearance Requested:   - Pharmacy:  Hold Apixaban (Eliquis) How many days does medication need to be held prior to surgery   Type of Anesthesia:   Choice   Additional requests/questions:    Signed, Belisicia Alroy Bailiff   06/20/2022, 3:23 PM

## 2022-06-21 ENCOUNTER — Other Ambulatory Visit (HOSPITAL_COMMUNITY): Payer: Self-pay

## 2022-06-21 DIAGNOSIS — E113512 Type 2 diabetes mellitus with proliferative diabetic retinopathy with macular edema, left eye: Secondary | ICD-10-CM | POA: Diagnosis not present

## 2022-06-21 MED ORDER — METFORMIN HCL 1000 MG PO TABS
ORAL_TABLET | ORAL | 3 refills | Status: DC
  Filled 2022-06-21: qty 135, 90d supply, fill #0
  Filled 2022-09-15: qty 135, 90d supply, fill #1
  Filled 2023-01-08 (×2): qty 135, 90d supply, fill #2
  Filled 2023-04-09: qty 135, 90d supply, fill #3

## 2022-06-22 NOTE — Telephone Encounter (Signed)
[facility] called back stating they need medical clearance as well.

## 2022-06-23 NOTE — Telephone Encounter (Signed)
Patient with diagnosis of afib on Eliquis for anticoagulation.    Procedure: Right Carpatunel Release Right Ulnar Nerve Decompression Cubical Tunnel with Possible Transposition  Date of procedure: 07/11/22   CHA2DS2-VASc Score = 4   This indicates a 4.8% annual risk of stroke. The patient's score is based upon: CHF History: 1 HTN History: 1 Diabetes History: 1 Stroke History: 0 Vascular Disease History: 1 Age Score: 0 Gender Score: 0      CrCl 110 ml/min  Per office protocol, patient can hold Eliquis for 2 days prior to procedure.    **This guidance is not considered finalized until pre-operative APP has relayed final recommendations.**

## 2022-06-27 ENCOUNTER — Other Ambulatory Visit (HOSPITAL_COMMUNITY): Payer: Self-pay

## 2022-06-27 NOTE — Telephone Encounter (Signed)
Primary Cardiologist:Thomas Tresa Endo, MD   Preoperative team, please contact this patient and set up a phone call appointment for further preoperative risk assessment. Please obtain consent and complete medication review. Thank you for your help.   I confirm that guidance regarding antiplatelet and oral anticoagulation therapy has been completed and, if necessary, noted below.   Levi Aland, NP-C    06/27/2022, 12:31 PM [address], [address] Office [phone] Fax [phone]

## 2022-06-27 NOTE — Telephone Encounter (Signed)
Left message for the pt to call back for tele pre op appt.  ?

## 2022-06-28 ENCOUNTER — Telehealth: Payer: Self-pay | Admitting: *Deleted

## 2022-06-28 DIAGNOSIS — E113511 Type 2 diabetes mellitus with proliferative diabetic retinopathy with macular edema, right eye: Secondary | ICD-10-CM | POA: Diagnosis not present

## 2022-06-28 NOTE — Telephone Encounter (Signed)
I s/w the pt and he is agreeable to plan of care for tele pre op appt 07/07/22 @ 2:40. Med rec not done as pt was just waking up. Consent has been given though. Meds will need to be review during tele appt.

## 2022-06-28 NOTE — Telephone Encounter (Signed)
I s/w the pt and he is agreeable to plan of care for tele pre op appt 07/07/22 @ 2:40. Med rec not done as pt was just waking up. Consent has been given though. Meds will need to be review during tele appt.     Patient Consent for Virtual Visit        [name] Merritt has provided verbal consent on 06/28/2022 for a virtual visit (video or telephone).   CONSENT FOR VIRTUAL VISIT FOR:  [name] Merritt  By participating in this virtual visit I agree to the following:  I hereby voluntarily request, consent and authorize [facility] and its employed or contracted physicians, physician assistants, nurse practitioners or other licensed health care professionals (the Practitioner), to provide me with telemedicine health care services (the "Services") as deemed necessary by the treating Practitioner. I acknowledge and consent to receive the Services by the Practitioner via telemedicine. I understand that the telemedicine visit will involve communicating with the Practitioner through live audiovisual communication technology and the disclosure of certain medical information by electronic transmission. I acknowledge that I have been given the opportunity to request an in-person assessment or other available alternative prior to the telemedicine visit and am voluntarily participating in the telemedicine visit.  I understand that I have the right to withhold or withdraw my consent to the use of telemedicine in the course of my care at any time, without affecting my right to future care or treatment, and that the Practitioner or I may terminate the telemedicine visit at any time. I understand that I have the right to inspect all information obtained and/or recorded in the course of the telemedicine visit and may receive copies of available information for a reasonable fee.  I understand that some of the potential risks of receiving the Services via telemedicine include:  Delay or interruption in medical  evaluation due to technological equipment failure or disruption; Information transmitted may not be sufficient (e.g. poor resolution of images) to allow for appropriate medical decision making by the Practitioner; and/or  In rare instances, security protocols could fail, causing a breach of personal health information.  Furthermore, I acknowledge that it is my responsibility to provide information about my medical history, conditions and care that is complete and accurate to the best of my ability. I acknowledge that Practitioner's advice, recommendations, and/or decision may be based on factors not within their control, such as incomplete or inaccurate data provided by me or distortions of diagnostic images or specimens that may result from electronic transmissions. I understand that the practice of medicine is not an exact science and that Practitioner makes no warranties or guarantees regarding treatment outcomes. I acknowledge that a copy of this consent can be made available to me via my patient portal [facility] MyChart), or I can request a printed copy by calling the office of [facility].    I understand that my insurance will be billed for this visit.   I have read or had this consent read to me. I understand the contents of this consent, which adequately explains the benefits and risks of the Services being provided via telemedicine.  I have been provided ample opportunity to ask questions regarding this consent and the Services and have had my questions answered to my satisfaction. I give my informed consent for the services to be provided through the use of telemedicine in my medical care

## 2022-06-30 ENCOUNTER — Telehealth: Payer: Self-pay | Admitting: Cardiovascular Disease

## 2022-06-30 NOTE — Telephone Encounter (Signed)
Caller stated patient's Eliquis may need to be held prior to his Pre-Op clearance visit on 07/07/22.  Caller would like to confirm when the patient's Eliquis should be stopped.

## 2022-06-30 NOTE — Telephone Encounter (Signed)
I s/w Steward Drone at the [facility] and explained that we are going to be holding eliquis x 2 per pharm-d. Once tele appt is done we will send over clearance notes and final recommendations. [facility] Drone thanked me for the call .

## 2022-07-01 ENCOUNTER — Other Ambulatory Visit: Payer: Self-pay | Admitting: Cardiovascular Disease

## 2022-07-03 ENCOUNTER — Other Ambulatory Visit (HOSPITAL_COMMUNITY): Payer: Self-pay

## 2022-07-03 MED ORDER — AMLODIPINE BESYLATE 5 MG PO TABS
5.0000 mg | ORAL_TABLET | Freq: Every day | ORAL | 5 refills | Status: DC
  Filled 2022-07-03: qty 30, 30d supply, fill #0
  Filled 2022-07-27: qty 30, 30d supply, fill #1
  Filled 2022-08-24: qty 30, 30d supply, fill #2
  Filled 2022-09-25: qty 30, 30d supply, fill #3
  Filled 2022-10-29 – 2022-10-30 (×2): qty 30, 30d supply, fill #4
  Filled 2022-11-27: qty 30, 30d supply, fill #5

## 2022-07-04 ENCOUNTER — Encounter (HOSPITAL_BASED_OUTPATIENT_CLINIC_OR_DEPARTMENT_OTHER): Payer: Self-pay | Admitting: Orthopedic Surgery

## 2022-07-04 ENCOUNTER — Other Ambulatory Visit: Payer: Self-pay

## 2022-07-05 NOTE — Progress Notes (Addendum)
Virtual Visit via Telephone Note   Because of [name] Merritt's co-morbid illnesses, he is at least at moderate risk for complications without adequate follow up.  This format is felt to be most appropriate for this patient at this time.  The patient did not have access to video technology/had technical difficulties with video requiring transitioning to audio format only (telephone).  All issues noted in this document were discussed and addressed.  No physical exam could be performed with this format.  Please refer to the patient's chart for his consent to telehealth for [name] Merritt.  Evaluation Performed:  Preoperative cardiovascular risk assessment _____________   Date:  07/05/2022   Patient ID:  [name] Merr[facility], DOB November 24, 1970, MRN [id number] Patient Location:  Home Provider location:   Office  Primary Care Provider:  Renford Dills, MD Primary Cardiologist:  Nicki Guadalajara, MD  Chief Complaint / Patient Profile   51 y.o. y/o male with a h/o CAD s/p STEMI with PCI/DES to LAD, ICM, diastolic dysfunction, atrial flutter on chronic anticoagulation, OSA, obesity, insulin dependent diabetes, hypertension, and hyperlipidemia who is pending right carpal tunnel release, right ulnar nerve decompression cubical tunnel and possible transposition and presents today for telephonic preoperative cardiovascular risk assessment.  Past Medical History    Past Medical History:  Diagnosis Date   Anxiety    CHF (congestive heart failure) (HCC)    Coronary artery disease    Diabetes mellitus without complication (HCC)    type II   GERD (gastroesophageal reflux disease)    not currently   High cholesterol    Hypertension    Myocardial infarction (HCC) 05/30/2020   New onset atrial flutter (HCC) 06/09/2020   Overweight    Sleep apnea    Past Surgical History:  Procedure Laterality Date   CARDIOVERSION N/A 06/10/2020   Procedure: CARDIOVERSION;  Surgeon: Little Ishikawa, MD;   Location: [facility] ENDOSCOPY;  Service: Cardiovascular;  Laterality: N/A;   CORONARY STENT INTERVENTION N/A 05/30/2020   Procedure: CORONARY STENT INTERVENTION;  Surgeon: Lennette Bihari, MD;  Location: [facility] INVASIVE CV LAB;  Service: Cardiovascular;  Laterality: N/A;   CORONARY/GRAFT ACUTE MI REVASCULARIZATION N/A 05/30/2020   Procedure: Coronary/Graft Acute MI Revascularization;  Surgeon: Lennette Bihari, MD;  Location: [facility] INVASIVE CV LAB;  Service: Cardiovascular;  Laterality: N/A;   GAS INSERTION Right 07/19/2021   Procedure: INSERTION OF GAS - C3F8;  Surgeon: Carmela Rima, MD;  Location: [facility] OR;  Service: Ophthalmology;  Laterality: Right;   GAS/FLUID EXCHANGE Right 07/19/2021   Procedure: GAS/FLUID EXCHANGE;  Surgeon: Carmela Rima, MD;  Location: [facility] OR;  Service: Ophthalmology;  Laterality: Right;   LEFT HEART CATH AND CORONARY ANGIOGRAPHY N/A 05/30/2020   Procedure: LEFT HEART CATH AND CORONARY ANGIOGRAPHY;  Surgeon: Lennette Bihari, MD;  Location: [facility] INVASIVE CV LAB;  Service: Cardiovascular;  Laterality: N/A;   MEMBRANE PEEL Right 07/19/2021   Procedure: MEMBRANE PEEL;  Surgeon: Carmela Rima, MD;  Location: [facility] OR;  Service: Ophthalmology;  Laterality: Right;   PARS PLANA VITRECTOMY 27 GAUGE Right 09/27/2021   Procedure: PARS PLANA VITRECTOMY 27 GAUGE;  Surgeon: Carmela Rima, MD;  Location: [facility] OR;  Service: Ophthalmology;  Laterality: Right;   PHOTOCOAGULATION WITH LASER Right 07/19/2021   Procedure: PHOTOCOAGULATION WITH LASER;  Surgeon: Carmela Rima, MD;  Location: [facility] OR;  Service: Ophthalmology;  Laterality: Right;   PHOTOCOAGULATION WITH LASER Right 09/27/2021   Procedure: PHOTOCOAGULATION WITH LASER;  Surgeon: Carmela Rima, MD;  Location: [facility] OR;  Service: Ophthalmology;  Laterality: Right;   REPAIR OF COMPLEX TRACTION RETINAL DETACHMENT Right 07/19/2021   Procedure: REPAIR OF COMPLEX TRACTION RETINAL DETACHMENT;  Surgeon: Carmela Rima, MD;  Location: [facility] OR;  Service: Ophthalmology;   Laterality: Right;   TEE WITHOUT CARDIOVERSION N/A 06/10/2020   Procedure: TRANSESOPHAGEAL ECHOCARDIOGRAM (TEE);  Surgeon: Little Ishikawa, MD;  Location: [facility] ENDOSCOPY;  Service: Cardiovascular;  Laterality: N/A;   torn rotator cuff  2018   2018    Allergies  Allergies  Allergen Reactions   Pineapple Itching and Other (See Comments)    All-over itching (mouth, too)   Tomato Itching and Other (See Comments)    All-over itching (mouth, too) Ketchup, also   Rosuvastatin Calcium Hives and Rash    History of Present Illness    [name] Merr[facility] is a 51 y.o. male who presents via audio/video conferencing for a telehealth visit today.  Pt was last seen in cardiology clinic on 05/11/22 by Dr. Tresa Endo.  At that time [name] Merr[facility] was doing well.  The patient is now pending procedure as outlined above. Since his last visit, he  denies chest pain, shortness of breath, lower extremity edema, fatigue, palpitations, melena, hematuria, hemoptysis, diaphoresis, weakness, presyncope, syncope, orthopnea, and PND.  Home Medications    Prior to Admission medications   Medication Sig Start Date End Date Taking? Authorizing Provider  acetaminophen (TYLENOL) 650 MG CR tablet Take 1,300 mg by mouth every 8 (eight) hours as needed for pain.    [provider]  albuterol (VENTOLIN HFA) 108 (90 Base) MCG/ACT inhaler Inhale 2 puffs into the lungs every 6 (six) hours as needed for wheezing or shortness of breath.    [provider]  ALPRAZolam Prudy Feeler) 0.25 MG tablet Take 1 tablet (0.25 mg total) by mouth daily as needed. 10/05/21     amLODipine (NORVASC) 5 MG tablet Take 1 tablet (5 mg total) by mouth daily. 07/03/22   Lennette Bihari, MD  apixaban (ELIQUIS) 5 MG TABS tablet Take 1 tablet (5 mg total) by mouth 2 (two) times daily. 03/31/22 03/31/23  Lennette Bihari, MD  Ascorbic Acid (VITAMIN C) 1000 MG tablet Take 1,000 mg by mouth daily.    [provider]  atorvastatin (LIPITOR) 80  MG tablet Take 1 tablet (80 mg total) by mouth daily at 6 PM. 07/01/20   Azalee Course, PA  bacitracin-polymyxin b (POLYSPORIN) ophthalmic ointment Place into the right eye 4 (four) times daily for 10 days 03/09/22     cetirizine (ZYRTEC) 10 MG tablet TAKE 1 TABLET(10 MG) BY MOUTH DAILY 08/16/20   Charlott Holler, MD  CINNAMON PO Take 1,500 mg by mouth 2 (two) times daily.    [provider]  clopidogrel (PLAVIX) 75 MG tablet Take 1 tablet (75 mg total) by mouth daily. 05/29/22   Georgie Chard D, NP  dapagliflozin propanediol (FARXIGA) 10 MG TABS tablet Take 1 tablet (10 mg total) by mouth daily. 11/17/21   Lennette Bihari, MD  Evolocumab (REPATHA SURECLICK) 140 MG/ML SOAJ Inject 1 Dose into the skin every 14 (fourteen) days. 03/31/22   Hilty, Lisette Abu, MD  fluticasone (FLONASE) 50 MCG/ACT nasal spray SHAKE LIQUID AND USE 1 SPRAY IN EACH NOSTRIL DAILY 05/25/20   Charlott Holler, MD  insulin glargine-yfgn (SEMGLEE, YFGN,) 100 UNIT/ML Pen Inject 55 Units into the skin daily or as directed. 02/14/21     magnesium citrate SOLN Take 1 Bottle by mouth once as needed for mild constipation or moderate constipation.  [provider]  Melatonin 10 MG TABS Take 10 mg by mouth at bedtime as needed (sleep).    [provider]  metFORMIN (GLUCOPHAGE) 1000 MG tablet Take 1 tablet (1,000 mg total) by mouth in the morning AND 0.5 tablets (500 mg total) every evening with meals 06/21/22     metoprolol succinate (TOPROL-XL) 25 MG 24 hr tablet Take 2 tablets (50 mg total) by mouth daily. 12/20/21     moxifloxacin (VIGAMOX) 0.5 % ophthalmic solution Place 1 drop into the right eye 4 (four) times daily. 03/09/22     nitroGLYCERIN (NITROSTAT) 0.4 MG SL tablet Place 1 tablet (0.4 mg total) under the tongue every 5 (five) minutes as needed for chest pain. 12/28/21   Lennette Bihari, MD  ofloxacin (OCUFLOX) 0.3 % ophthalmic solution Place 1 drop into the right eye 4 (four) times daily for 1 week 09/28/21   Carmela Rima, MD  pantoprazole (PROTONIX) 40 MG tablet Take 1 tablet (40 mg total) by mouth 2 (two) times daily. 09/22/21     polyethylene glycol-electrolytes (NULYTELY) 420 g solution Use as directed 11/30/21     sacubitril-valsartan (ENTRESTO) 97-103 MG Take 1 tablet by mouth 2 (two) times daily. 06/19/22   Lennette Bihari, MD  sildenafil (REVATIO) 20 MG tablet Take 20 mg by mouth daily as needed for erectile dysfunction.    [provider]  spironolactone (ALDACTONE) 25 MG tablet Take 0.5 tablets (12.5 mg total) by mouth 2 (two) times daily. 11/17/21   Lennette Bihari, MD  traZODone (DESYREL) 100 MG tablet Take 1 tablet (100 mg total) by mouth at bedtime. 12/20/21     zinc gluconate 50 MG tablet Take 50 mg by mouth daily.    [provider]    Physical Exam    Vital Signs:  [name] [facility] does not have vital signs available for review today.  Given telephonic nature of communication, physical exam is limited. AAOx3. NAD. Normal affect.  Speech and respirations are unlabored.  Accessory Clinical Findings    None  Assessment & Plan    1.  Preoperative Cardiovascular Risk Assessment:The patient is doing well from a cardiac perspective. Therefore, based on ACC/AHA guidelines, the patient would be at acceptable risk for the planned procedure without further cardiovascular testing. The patient was advised that if he develops new symptoms prior to surgery to contact our office to arrange for a follow-up visit, and he verbalized understanding. According to the Revised Cardiac Risk Index (RCRI), his Perioperative Risk of Major Cardiac Event is (%): 6.6. His Functional Capacity in METs is: 7.59 according to the [place] Activity Status Index (DASI).  The patient was advised that if he develops new symptoms prior to surgery to contact our office to arrange for a follow-up visit, and he verbalized understanding.   Addendum: received a late request after the virtual visit for patient to hold  Plavix as well. Per office protocol, he may hold Plavix for 5 days prior to procedure.  Per office protocol, patient can hold Eliquis for 2 days prior to procedure.      A copy of this note will be routed to requesting surgeon.  Time:   Today, I have spent 10 minutes with the patient with telehealth technology discussing medical history, symptoms, and management plan.    Levi Aland, NP-C  07/07/2022, 2:46 PM [address] Office [phone] Fax [phone]

## 2022-07-07 ENCOUNTER — Ambulatory Visit: Payer: 59 | Attending: Cardiovascular Disease | Admitting: Nurse Practitioner

## 2022-07-07 ENCOUNTER — Encounter: Payer: Self-pay | Admitting: Nurse Practitioner

## 2022-07-07 ENCOUNTER — Encounter (HOSPITAL_BASED_OUTPATIENT_CLINIC_OR_DEPARTMENT_OTHER)
Admission: RE | Admit: 2022-07-07 | Discharge: 2022-07-07 | Disposition: A | Discharge status: Home / Self Care | Payer: 59 | Source: Ambulatory Visit | Attending: Orthopedic Surgery | Admitting: Orthopedic Surgery

## 2022-07-07 DIAGNOSIS — Z87891 Personal history of nicotine dependence: Secondary | ICD-10-CM | POA: Diagnosis not present

## 2022-07-07 DIAGNOSIS — I509 Heart failure, unspecified: Secondary | ICD-10-CM | POA: Diagnosis not present

## 2022-07-07 DIAGNOSIS — G5601 Carpal tunnel syndrome, right upper limb: Secondary | ICD-10-CM | POA: Diagnosis not present

## 2022-07-07 DIAGNOSIS — Z794 Long term (current) use of insulin: Secondary | ICD-10-CM | POA: Diagnosis not present

## 2022-07-07 DIAGNOSIS — G5621 Lesion of ulnar nerve, right upper limb: Secondary | ICD-10-CM | POA: Diagnosis not present

## 2022-07-07 DIAGNOSIS — I110 Hypertensive heart disease with heart failure: Secondary | ICD-10-CM | POA: Diagnosis not present

## 2022-07-07 DIAGNOSIS — Z01810 Encounter for preprocedural cardiovascular examination: Secondary | ICD-10-CM

## 2022-07-07 DIAGNOSIS — E119 Type 2 diabetes mellitus without complications: Secondary | ICD-10-CM | POA: Diagnosis not present

## 2022-07-07 DIAGNOSIS — Z955 Presence of coronary angioplasty implant and graft: Secondary | ICD-10-CM | POA: Diagnosis not present

## 2022-07-07 DIAGNOSIS — I2510 Atherosclerotic heart disease of native coronary artery without angina pectoris: Secondary | ICD-10-CM | POA: Diagnosis not present

## 2022-07-07 DIAGNOSIS — I252 Old myocardial infarction: Secondary | ICD-10-CM | POA: Diagnosis not present

## 2022-07-07 DIAGNOSIS — Z7984 Long term (current) use of oral hypoglycemic drugs: Secondary | ICD-10-CM | POA: Diagnosis not present

## 2022-07-07 DIAGNOSIS — Z6839 Body mass index (BMI) 39.0-39.9, adult: Secondary | ICD-10-CM | POA: Diagnosis not present

## 2022-07-07 LAB — BASIC METABOLIC PANEL
Anion gap: 8 (ref 5–15)
BUN: 15 mg/dL (ref 6–20)
CO2: 23 mmol/L (ref 22–32)
Calcium: 8.8 mg/dL — ABNORMAL LOW (ref 8.9–10.3)
Chloride: 108 mmol/L (ref 98–111)
Creatinine, Ser: 1.08 mg/dL (ref 0.61–1.24)
GFR, Estimated: >60 mL/min (ref >60)
Glucose, Bld: 118 mg/dL — ABNORMAL HIGH (ref 70–99)
Potassium: 4.7 mmol/L (ref 3.5–5.1)
Sodium: 139 mmol/L (ref 135–145)

## 2022-07-07 NOTE — Telephone Encounter (Signed)
Patient's wife is calling requesting to speak with Marcelino Duster regarding the medication that needs to be held for this procedure. Please advise.

## 2022-07-07 NOTE — Telephone Encounter (Signed)
Left message for Steward Drone with [facility] to confirm that they need to hold Plavix as well. This was confirmed

## 2022-07-07 NOTE — Progress Notes (Signed)

## 2022-07-07 NOTE — Telephone Encounter (Signed)
I s/w the pt's wife  (DPR) and she has been informed of meds to be held for pt's surgery 07/11/22.   Wife verbalized x 2 Plavix hold as of today; though this will not be a 5 day hold it will be a 3 day prior to surgery date.   Eliquis hold x 2 days prior to surgery date.  Resume both Eliquis and Plavix once surgeon feels it is safe.   Pt's wife thanked me for the help.   Surgery clearance was not noted if the Plavix needed to be held.

## 2022-07-07 NOTE — Telephone Encounter (Signed)
After patient's virtual visit today, we received a request for patient to hold Plavix for upcoming procedure.  Per office protocol patient may hold Plavix for 5 days prior to procedure.

## 2022-07-07 NOTE — Addendum Note (Signed)
Addended by: Levi Aland on: 07/07/2022 04:56 PM   Modules accepted: Orders

## 2022-07-07 NOTE — Telephone Encounter (Signed)
Error

## 2022-07-10 ENCOUNTER — Other Ambulatory Visit (HOSPITAL_COMMUNITY): Payer: Self-pay

## 2022-07-11 ENCOUNTER — Encounter (HOSPITAL_BASED_OUTPATIENT_CLINIC_OR_DEPARTMENT_OTHER)
Admission: RE | Disposition: A | Discharge status: Home / Self Care | Payer: Self-pay | Source: Home / Self Care | Attending: Orthopedic Surgery

## 2022-07-11 ENCOUNTER — Encounter (HOSPITAL_BASED_OUTPATIENT_CLINIC_OR_DEPARTMENT_OTHER): Payer: Self-pay | Admitting: Orthopedic Surgery

## 2022-07-11 ENCOUNTER — Ambulatory Visit (HOSPITAL_BASED_OUTPATIENT_CLINIC_OR_DEPARTMENT_OTHER): Payer: 59 | Admitting: Anesthesiology

## 2022-07-11 ENCOUNTER — Other Ambulatory Visit: Payer: Self-pay

## 2022-07-11 ENCOUNTER — Ambulatory Visit (HOSPITAL_BASED_OUTPATIENT_CLINIC_OR_DEPARTMENT_OTHER)
Admission: RE | Admit: 2022-07-11 | Discharge: 2022-07-11 | Disposition: A | Discharge status: Home / Self Care | Payer: 59 | Attending: Orthopedic Surgery | Admitting: Orthopedic Surgery

## 2022-07-11 ENCOUNTER — Other Ambulatory Visit (HOSPITAL_COMMUNITY): Payer: Self-pay

## 2022-07-11 DIAGNOSIS — Z794 Long term (current) use of insulin: Secondary | ICD-10-CM | POA: Diagnosis not present

## 2022-07-11 DIAGNOSIS — I2510 Atherosclerotic heart disease of native coronary artery without angina pectoris: Secondary | ICD-10-CM | POA: Insufficient documentation

## 2022-07-11 DIAGNOSIS — Z955 Presence of coronary angioplasty implant and graft: Secondary | ICD-10-CM | POA: Insufficient documentation

## 2022-07-11 DIAGNOSIS — G5601 Carpal tunnel syndrome, right upper limb: Secondary | ICD-10-CM | POA: Diagnosis not present

## 2022-07-11 DIAGNOSIS — Z87891 Personal history of nicotine dependence: Secondary | ICD-10-CM | POA: Insufficient documentation

## 2022-07-11 DIAGNOSIS — Z6839 Body mass index (BMI) 39.0-39.9, adult: Secondary | ICD-10-CM | POA: Insufficient documentation

## 2022-07-11 DIAGNOSIS — I509 Heart failure, unspecified: Secondary | ICD-10-CM | POA: Diagnosis not present

## 2022-07-11 DIAGNOSIS — I252 Old myocardial infarction: Secondary | ICD-10-CM | POA: Insufficient documentation

## 2022-07-11 DIAGNOSIS — G5621 Lesion of ulnar nerve, right upper limb: Secondary | ICD-10-CM | POA: Insufficient documentation

## 2022-07-11 DIAGNOSIS — I110 Hypertensive heart disease with heart failure: Secondary | ICD-10-CM | POA: Insufficient documentation

## 2022-07-11 DIAGNOSIS — I10 Essential (primary) hypertension: Secondary | ICD-10-CM | POA: Diagnosis not present

## 2022-07-11 DIAGNOSIS — Z01818 Encounter for other preprocedural examination: Secondary | ICD-10-CM

## 2022-07-11 DIAGNOSIS — G8918 Other acute postprocedural pain: Secondary | ICD-10-CM | POA: Diagnosis not present

## 2022-07-11 DIAGNOSIS — E119 Type 2 diabetes mellitus without complications: Secondary | ICD-10-CM | POA: Insufficient documentation

## 2022-07-11 DIAGNOSIS — Z7984 Long term (current) use of oral hypoglycemic drugs: Secondary | ICD-10-CM | POA: Insufficient documentation

## 2022-07-11 HISTORY — PX: ULNAR NERVE TRANSPOSITION: SHX2595

## 2022-07-11 HISTORY — PX: CARPAL TUNNEL RELEASE: SHX101

## 2022-07-11 LAB — GLUCOSE, CAPILLARY
Glucose-Capillary: 132 mg/dL — ABNORMAL HIGH (ref 70–99)
Glucose-Capillary: 86 mg/dL (ref 70–99)

## 2022-07-11 SURGERY — CARPAL TUNNEL RELEASE
Anesthesia: General | Site: Wrist | Laterality: Right

## 2022-07-11 MED ORDER — OXYCODONE HCL 5 MG/5ML PO SOLN: 5.0000 mg | Freq: Once | ORAL | Status: DC | PRN

## 2022-07-11 MED ORDER — 0.9 % SODIUM CHLORIDE (POUR BTL) OPTIME
TOPICAL | Status: DC | PRN
  Administered 2022-07-11: 100 mL

## 2022-07-11 MED ORDER — FENTANYL CITRATE (PF) 100 MCG/2ML IJ SOLN
INTRAMUSCULAR | Status: DC | PRN
  Administered 2022-07-11: 50 ug via INTRAVENOUS

## 2022-07-11 MED ORDER — MIDAZOLAM HCL 2 MG/2ML IJ SOLN: INTRAMUSCULAR | Status: DC | PRN

## 2022-07-11 MED ORDER — ONDANSETRON HCL 4 MG/2ML IJ SOLN
INTRAMUSCULAR | Status: DC | PRN
  Administered 2022-07-11: 4 mg via INTRAVENOUS

## 2022-07-11 MED ORDER — MIDAZOLAM HCL 2 MG/2ML IJ SOLN
INTRAMUSCULAR | Status: AC
  Filled 2022-07-11: qty 2

## 2022-07-11 MED ORDER — FENTANYL CITRATE (PF) 100 MCG/2ML IJ SOLN
INTRAMUSCULAR | Status: AC
  Filled 2022-07-11: qty 2

## 2022-07-11 MED ORDER — DEXAMETHASONE SODIUM PHOSPHATE 10 MG/ML IJ SOLN
INTRAMUSCULAR | Status: DC | PRN
  Administered 2022-07-11: 5 mg via INTRAVENOUS

## 2022-07-11 MED ORDER — CEFAZOLIN SODIUM-DEXTROSE 2-4 GM/100ML-% IV SOLN: 2.0000 g | INTRAVENOUS | Status: DC

## 2022-07-11 MED ORDER — GLYCOPYRROLATE 0.2 MG/ML IJ SOLN
INTRAMUSCULAR | Status: DC | PRN
  Administered 2022-07-11: .2 mg via INTRAVENOUS

## 2022-07-11 MED ORDER — ROPIVACAINE HCL 5 MG/ML IJ SOLN
INTRAMUSCULAR | Status: DC | PRN
  Administered 2022-07-11: 40 mL via PERINEURAL

## 2022-07-11 MED ORDER — CEFAZOLIN IN SODIUM CHLORIDE 3-0.9 GM/100ML-% IV SOLN
INTRAVENOUS | Status: AC
  Filled 2022-07-11: qty 100

## 2022-07-11 MED ORDER — FENTANYL CITRATE (PF) 100 MCG/2ML IJ SOLN
100.0000 ug | Freq: Once | INTRAMUSCULAR | Status: AC
  Administered 2022-07-11: 100 ug via INTRAVENOUS

## 2022-07-11 MED ORDER — LIDOCAINE 2% (20 MG/ML) 5 ML SYRINGE
INTRAMUSCULAR | Status: AC
  Filled 2022-07-11: qty 5

## 2022-07-11 MED ORDER — ONDANSETRON HCL 4 MG/2ML IJ SOLN: 4.0000 mg | Freq: Once | INTRAMUSCULAR | Status: DC | PRN

## 2022-07-11 MED ORDER — PROPOFOL 10 MG/ML IV BOLUS
INTRAVENOUS | Status: DC | PRN
  Administered 2022-07-11: 300 mg via INTRAVENOUS

## 2022-07-11 MED ORDER — CEFAZOLIN IN SODIUM CHLORIDE 3-0.9 GM/100ML-% IV SOLN
3.0000 g | Freq: Once | INTRAVENOUS | Status: AC
  Administered 2022-07-11: 3 g via INTRAVENOUS

## 2022-07-11 MED ORDER — PROPOFOL 10 MG/ML IV BOLUS
INTRAVENOUS | Status: AC
  Filled 2022-07-11: qty 20

## 2022-07-11 MED ORDER — MIDAZOLAM HCL 2 MG/2ML IJ SOLN
2.0000 mg | Freq: Once | INTRAMUSCULAR | Status: AC
  Administered 2022-07-11: 2 mg via INTRAVENOUS

## 2022-07-11 MED ORDER — DEXAMETHASONE SODIUM PHOSPHATE 10 MG/ML IJ SOLN
INTRAMUSCULAR | Status: AC
  Filled 2022-07-11: qty 1

## 2022-07-11 MED ORDER — OXYCODONE HCL 5 MG PO TABS: 5.0000 mg | ORAL_TABLET | Freq: Once | ORAL | Status: DC | PRN

## 2022-07-11 MED ORDER — LIDOCAINE HCL (CARDIAC) PF 100 MG/5ML IV SOSY
PREFILLED_SYRINGE | INTRAVENOUS | Status: DC | PRN
  Administered 2022-07-11: 100 mg via INTRAVENOUS

## 2022-07-11 MED ORDER — PHENYLEPHRINE HCL (PRESSORS) 10 MG/ML IV SOLN
INTRAVENOUS | Status: DC | PRN
  Administered 2022-07-11 (×3): 80 ug via INTRAVENOUS

## 2022-07-11 MED ORDER — ONDANSETRON HCL 4 MG/2ML IJ SOLN
INTRAMUSCULAR | Status: AC
  Filled 2022-07-11: qty 2

## 2022-07-11 MED ORDER — HYDROCODONE-ACETAMINOPHEN 5-325 MG PO TABS
1.0000 | ORAL_TABLET | Freq: Four times a day (QID) | ORAL | 0 refills | Status: DC | PRN
  Filled 2022-07-11: qty 20, 3d supply, fill #0

## 2022-07-11 MED ORDER — LACTATED RINGERS IV SOLN: INTRAVENOUS | Status: DC

## 2022-07-11 MED ORDER — FENTANYL CITRATE (PF) 100 MCG/2ML IJ SOLN: 25.0000 ug | INTRAMUSCULAR | Status: DC | PRN

## 2022-07-11 MED ORDER — EPHEDRINE SULFATE (PRESSORS) 50 MG/ML IJ SOLN
INTRAMUSCULAR | Status: DC | PRN
  Administered 2022-07-11 (×3): 10 mg via INTRAVENOUS

## 2022-07-11 MED ORDER — KETOROLAC TROMETHAMINE 30 MG/ML IJ SOLN: 30.0000 mg | Freq: Once | INTRAMUSCULAR | Status: DC | PRN

## 2022-07-11 SURGICAL SUPPLY — 51 items
APL PRP STRL LF DISP 70% ISPRP (MISCELLANEOUS) ×1
BLADE MINI RND TIP GREEN BEAV (BLADE) IMPLANT
BLADE SURG 15 STRL LF DISP TIS (BLADE) ×2 IMPLANT
BLADE SURG 15 STRL SS (BLADE) ×2
BNDG CMPR 9X4 STRL LF SNTH (GAUZE/BANDAGES/DRESSINGS) ×1
BNDG ELASTIC 3X5.8 VLCR STR LF (GAUZE/BANDAGES/DRESSINGS) ×2 IMPLANT
BNDG ELASTIC 4X5.8 VLCR STR LF (GAUZE/BANDAGES/DRESSINGS) ×1 IMPLANT
BNDG ESMARK 4X9 LF (GAUZE/BANDAGES/DRESSINGS) ×1 IMPLANT
BNDG GAUZE DERMACEA FLUFF 4 (GAUZE/BANDAGES/DRESSINGS) ×1 IMPLANT
BNDG GZE DERMACEA 4 6PLY (GAUZE/BANDAGES/DRESSINGS) ×1
CHLORAPREP W/TINT 26 (MISCELLANEOUS) ×1 IMPLANT
CORD BIPOLAR FORCEPS 12FT (ELECTRODE) ×1 IMPLANT
COVER BACK TABLE 60X90IN (DRAPES) ×1 IMPLANT
COVER MAYO STAND STRL (DRAPES) ×1 IMPLANT
CUFF TOURN SGL QUICK 18X3 (MISCELLANEOUS) ×1 IMPLANT
CUFF TOURN SGL QUICK 18X4 (TOURNIQUET CUFF) ×1 IMPLANT
DRAPE EXTREMITY T 121X128X90 (DISPOSABLE) ×1 IMPLANT
DRAPE SURG 17X23 STRL (DRAPES) ×1 IMPLANT
GAUZE 4X4 16PLY ~~LOC~~+RFID DBL (SPONGE) IMPLANT
GAUZE PAD ABD 8X10 STRL (GAUZE/BANDAGES/DRESSINGS) ×1 IMPLANT
GAUZE SPONGE 4X4 12PLY STRL (GAUZE/BANDAGES/DRESSINGS) ×1 IMPLANT
GAUZE XEROFORM 1X8 LF (GAUZE/BANDAGES/DRESSINGS) ×1 IMPLANT
GLOVE BIO SURGEON STRL SZ7.5 (GLOVE) ×1 IMPLANT
GLOVE BIOGEL PI IND STRL 8 (GLOVE) ×1 IMPLANT
GLOVE BIOGEL PI IND STRL 8.5 (GLOVE) ×1 IMPLANT
GLOVE SURG ORTHO 8.0 STRL STRW (GLOVE) ×1 IMPLANT
GOWN STRL REUS W/ TWL LRG LVL3 (GOWN DISPOSABLE) ×1 IMPLANT
GOWN STRL REUS W/TWL LRG LVL3 (GOWN DISPOSABLE) ×1
GOWN STRL REUS W/TWL XL LVL3 (GOWN DISPOSABLE) ×2 IMPLANT
NDL HYPO 25X1 1.5 SAFETY (NEEDLE) ×1 IMPLANT
NEEDLE HYPO 25X1 1.5 SAFETY (NEEDLE) ×1 IMPLANT
NS IRRIG 1000ML POUR BTL (IV SOLUTION) ×1 IMPLANT
PACK BASIN DAY SURGERY FS (CUSTOM PROCEDURE TRAY) ×1 IMPLANT
PAD CAST 3X4 CTTN HI CHSV (CAST SUPPLIES) ×1 IMPLANT
PAD CAST 4YDX4 CTTN HI CHSV (CAST SUPPLIES) ×1 IMPLANT
PADDING CAST ABS COTTON 4X4 ST (CAST SUPPLIES) ×1 IMPLANT
PADDING CAST COTTON 3X4 STRL (CAST SUPPLIES) ×1
PADDING CAST COTTON 4X4 STRL (CAST SUPPLIES) ×1
SLEEVE SCD COMPRESS KNEE MED (STOCKING) ×1 IMPLANT
SPIKE FLUID TRANSFER (MISCELLANEOUS) IMPLANT
SPLINT PLASTER CAST FAST 5X30 (CAST SUPPLIES) IMPLANT
SPLINT PLASTER CAST XFAST 3X15 (CAST SUPPLIES) IMPLANT
STOCKINETTE 4X48 STRL (DRAPES) ×1 IMPLANT
SUT ETHILON 4 0 PS 2 18 (SUTURE) ×1 IMPLANT
SUT VIC AB 2-0 SH 27 (SUTURE) ×1
SUT VIC AB 2-0 SH 27XBRD (SUTURE) ×1 IMPLANT
SUT VICRYL 4-0 PS2 18IN ABS (SUTURE) IMPLANT
SYR BULB EAR ULCER 3OZ GRN STR (SYRINGE) ×1 IMPLANT
SYR CONTROL 10ML LL (SYRINGE) ×1 IMPLANT
TOWEL GREEN STERILE FF (TOWEL DISPOSABLE) ×2 IMPLANT
UNDERPAD 30X36 HEAVY ABSORB (UNDERPADS AND DIAPERS) ×1 IMPLANT

## 2022-07-11 NOTE — Anesthesia Procedure Notes (Signed)
Anesthesia Regional Block: Axillary brachial plexus block   Pre-Anesthetic Checklist: , timeout performed,  Correct Patient, Correct Site, Correct Laterality,  Correct Procedure, Correct Position, site marked,  Risks and benefits discussed,  Surgical consent,  Pre-op evaluation,  At surgeon's request and post-op pain management  Laterality: Right  Prep: chloraprep       Needles:  Injection technique: Single-shot  Needle Type: Echogenic Needle     Needle Length: 9cm      Additional Needles:   Procedures:,,,, ultrasound used (permanent image in chart),,    Narrative:  Start time: 07/11/2022 12:20 PM End time: 07/11/2022 12:29 PM Injection made incrementally with aspirations every 5 mL.  Performed by: Personally  Anesthesiologist: Myrtie Soman, MD  Additional Notes: Patient tolerated the procedure well without complications

## 2022-07-11 NOTE — Progress Notes (Signed)
Assisted Dr. Rose with right, axillary, ultrasound guided block. Side rails up, monitors on throughout procedure. See vital signs in flow sheet. Tolerated Procedure well. 

## 2022-07-11 NOTE — Anesthesia Postprocedure Evaluation (Signed)
Anesthesia Post Note  Patient: [name] [facility]  Procedure(s) Performed: RIGHT CARPAL TUNNEL RELEASE (Right: Wrist) RIGHT ULNAR NERVE DECOMPRESSION CUBITAL TUNNEL WITH POSSIBLE TRANSPOSITION (Right: Wrist)     Patient location during evaluation: PACU Anesthesia Type: General Level of consciousness: awake and alert Pain management: pain level controlled Vital Signs Assessment: post-procedure vital signs reviewed and stable Respiratory status: spontaneous breathing, nonlabored ventilation, respiratory function stable and patient connected to nasal cannula oxygen Cardiovascular status: blood pressure returned to baseline and stable Postop Assessment: no apparent nausea or vomiting Anesthetic complications: no   No notable events documented.  Last Vitals:  Vitals:   07/11/22 1500 07/11/22 1515  BP: 123/82   Pulse: (!) 48   Resp: 18 16  Temp: 36.5 C   SpO2: 95% 97%    Last Pain:  Vitals:   07/11/22 1515  TempSrc:   PainSc: 0-No pain                 Zeev Deakins S

## 2022-07-11 NOTE — H&P (Signed)
[name] [facility] is an 51 y.o. male.   Chief Complaint: numbness HPI: 51 yo male with numbness and tingling right hand.  Nocturnal symptoms.  Positive nerve conduction studies.  He wishes to have right carpal tunnel release and ulnar nerve decompression with possible transposition for management of the symptoms.  Allergies:  Allergies  Allergen Reactions   Pineapple Itching and Other (See Comments)    All-over itching (mouth, too)   Tomato Itching and Other (See Comments)    All-over itching (mouth, too) Ketchup, also   Rosuvastatin Calcium Hives and Rash    Past Medical History:  Diagnosis Date   Anxiety    CHF (congestive heart failure) ([place])    Coronary artery disease    Diabetes mellitus without complication ([place])    type II   GERD (gastroesophageal reflux disease)    not currently   High cholesterol    Hypertension    Myocardial infarction ([place]) 05/30/2020   New onset atrial flutter ([place]) 06/09/2020   Overweight    Sleep apnea     Past Surgical History:  Procedure Laterality Date   CARDIOVERSION N/A 06/10/2020   Procedure: CARDIOVERSION;  Surgeon: Donato Heinz, MD;  Location: [facility] ENDOSCOPY;  Service: Cardiovascular;  Laterality: N/A;   CORONARY STENT INTERVENTION N/A 05/30/2020   Procedure: CORONARY STENT INTERVENTION;  Surgeon: Troy Sine, MD;  Location: [place] CV LAB;  Service: Cardiovascular;  Laterality: N/A;   CORONARY/GRAFT ACUTE MI REVASCULARIZATION N/A 05/30/2020   Procedure: Coronary/Graft Acute MI Revascularization;  Surgeon: Troy Sine, MD;  Location: [place] CV LAB;  Service: Cardiovascular;  Laterality: N/A;   GAS INSERTION Right 07/19/2021   Procedure: INSERTION OF GAS - C3F8;  Surgeon: Jalene Mullet, MD;  Location: [place];  Service: Ophthalmology;  Laterality: Right;   GAS/FLUID EXCHANGE Right 07/19/2021   Procedure: GAS/FLUID EXCHANGE;  Surgeon: Jalene Mullet, MD;  Location: [place];  Service: Ophthalmology;  Laterality: Right;    LEFT HEART CATH AND CORONARY ANGIOGRAPHY N/A 05/30/2020   Procedure: LEFT HEART CATH AND CORONARY ANGIOGRAPHY;  Surgeon: Troy Sine, MD;  Location: [place] CV LAB;  Service: Cardiovascular;  Laterality: N/A;   MEMBRANE PEEL Right 07/19/2021   Procedure: MEMBRANE PEEL;  Surgeon: Jalene Mullet, MD;  Location: [place];  Service: Ophthalmology;  Laterality: Right;   PARS PLANA VITRECTOMY 27 GAUGE Right 09/27/2021   Procedure: PARS PLANA VITRECTOMY 27 GAUGE;  Surgeon: Jalene Mullet, MD;  Location: [place];  Service: Ophthalmology;  Laterality: Right;   PHOTOCOAGULATION WITH LASER Right 07/19/2021   Procedure: PHOTOCOAGULATION WITH LASER;  Surgeon: Jalene Mullet, MD;  Location: [place];  Service: Ophthalmology;  Laterality: Right;   PHOTOCOAGULATION WITH LASER Right 09/27/2021   Procedure: PHOTOCOAGULATION WITH LASER;  Surgeon: Jalene Mullet, MD;  Location: [facility];  Service: Ophthalmology;  Laterality: Right;   REPAIR OF COMPLEX TRACTION RETINAL DETACHMENT Right 07/19/2021   Procedure: REPAIR OF COMPLEX TRACTION RETINAL DETACHMENT;  Surgeon: Jalene Mullet, MD;  Location: [place];  Service: Ophthalmology;  Laterality: Right;   TEE WITHOUT CARDIOVERSION N/A 06/10/2020   Procedure: TRANSESOPHAGEAL ECHOCARDIOGRAM (TEE);  Surgeon: Donato Heinz, MD;  Location: [facility] ENDOSCOPY;  Service: Cardiovascular;  Laterality: N/A;   torn rotator cuff  2018   2018    Family History: Family History  Problem Relation Age of Onset   Hypertension Mother    Diabetes Mother    Hypertension Father    Diabetes Father    Diabetes Maternal Grandmother    Hypertension  Maternal Grandmother    Diabetes Maternal Grandfather    Hypertension Maternal Grandfather    Diabetes Paternal Grandmother    Hypertension Paternal Grandmother    Diabetes Paternal Grandfather    Hypertension Paternal Grandfather    Asthma Neg Hx     Social History:   reports that he has never smoked. He quit smokeless tobacco use about  6 years ago.  His smokeless tobacco use included chew. He reports that he does not currently use alcohol. He reports that he does not currently use drugs after having used the following drugs: Marijuana.  Medications: Medications Prior to Admission  Medication Sig Dispense Refill   acetaminophen (TYLENOL) 650 MG CR tablet Take 1,300 mg by mouth every 8 (eight) hours as needed for pain.     amLODipine (NORVASC) 5 MG tablet Take 1 tablet (5 mg total) by mouth daily. 30 tablet 5   apixaban (ELIQUIS) 5 MG TABS tablet Take 1 tablet (5 mg total) by mouth 2 (two) times daily. 180 tablet 1   Ascorbic Acid (VITAMIN C) 1000 MG tablet Take 1,000 mg by mouth daily.     atorvastatin (LIPITOR) 80 MG tablet Take 1 tablet (80 mg total) by mouth daily at 6 PM. 90 tablet 3   cetirizine (ZYRTEC) 10 MG tablet TAKE 1 TABLET(10 MG) BY MOUTH DAILY 30 tablet 5   CINNAMON PO Take 1,500 mg by mouth 2 (two) times daily.     clopidogrel (PLAVIX) 75 MG tablet Take 1 tablet (75 mg total) by mouth daily. 90 tablet 3   Evolocumab (REPATHA SURECLICK) XX123456 MG/ML SOAJ Inject 1 Dose into the skin every 14 (fourteen) days. 6 mL 3   metFORMIN (GLUCOPHAGE) 1000 MG tablet Take 1 tablet (1,000 mg total) by mouth in the morning AND 0.5 tablets (500 mg total) every evening with meals 135 tablet 3   metoprolol succinate (TOPROL-XL) 25 MG 24 hr tablet Take 2 tablets (50 mg total) by mouth daily. 180 tablet 3   pantoprazole (PROTONIX) 40 MG tablet Take 1 tablet (40 mg total) by mouth 2 (two) times daily. 180 tablet 3   sacubitril-valsartan (ENTRESTO) 97-103 MG Take 1 tablet by mouth 2 (two) times daily. 60 tablet 11   sildenafil (REVATIO) 20 MG tablet Take 20 mg by mouth daily as needed for erectile dysfunction.     spironolactone (ALDACTONE) 25 MG tablet Take 0.5 tablets (12.5 mg total) by mouth 2 (two) times daily. 30 tablet 11   traZODone (DESYREL) 100 MG tablet Take 1 tablet (100 mg total) by mouth at bedtime. 90 tablet 3   zinc  gluconate 50 MG tablet Take 50 mg by mouth daily.     albuterol (VENTOLIN HFA) 108 (90 Base) MCG/ACT inhaler Inhale 2 puffs into the lungs every 6 (six) hours as needed for wheezing or shortness of breath.     bacitracin-polymyxin b (POLYSPORIN) ophthalmic ointment Place into the right eye 4 (four) times daily for 10 days 3.5 g 5   dapagliflozin propanediol (FARXIGA) 10 MG TABS tablet Take 1 tablet (10 mg total) by mouth daily. 30 tablet 11   fluticasone (FLONASE) 50 MCG/ACT nasal spray SHAKE LIQUID AND USE 1 SPRAY IN EACH NOSTRIL DAILY 16 g 2   insulin glargine-yfgn (SEMGLEE, YFGN,) 100 UNIT/ML Pen Inject 55 Units into the skin daily or as directed. 45 mL 3   magnesium citrate SOLN Take 1 Bottle by mouth once as needed for mild constipation or moderate constipation.     Melatonin 10 MG  TABS Take 10 mg by mouth at bedtime as needed (sleep).     moxifloxacin (VIGAMOX) 0.5 % ophthalmic solution Place 1 drop into the right eye 4 (four) times daily. 3 mL 2   nitroGLYCERIN (NITROSTAT) 0.4 MG SL tablet Place 1 tablet (0.4 mg total) under the tongue every 5 (five) minutes as needed for chest pain. 25 tablet 3   ofloxacin (OCUFLOX) 0.3 % ophthalmic solution Place 1 drop into the right eye 4 (four) times daily for 1 week 5 mL 0   polyethylene glycol-electrolytes (NULYTELY) 420 g solution Use as directed 4000 mL 0    Results for orders placed or performed during the hospital encounter of 07/11/22 (from the past 48 hour(s))  Glucose, capillary     Status: Abnormal   Collection Time: 07/11/22 12:06 PM  Result Value Ref Range   Glucose-Capillary 132 (H) 70 - 99 mg/dL    Comment: Glucose reference range applies only to samples taken after fasting for at least 8 hours.    No results found.    Blood pressure (!) 106/94, pulse (!) 51, temperature (!) 97.5 F (36.4 C), temperature source Oral, resp. rate 16, height 6\' 1"  (1.854 m), weight (!) 137.4 kg, SpO2 96 %.  General appearance: alert, cooperative,  and appears stated age Head: Normocephalic, without obvious abnormality, atraumatic Neck: supple, symmetrical, trachea midline Extremities: Intact sensation and capillary refill all digits.  +epl/fpl/io.  No wounds.  Pulses: 2+ and symmetric Skin: Skin color, texture, turgor normal. No rashes or lesions Neurologic: Grossly normal Incision/Wound: none  Assessment/Plan Right carpal tunnel syndrome and ulnar nerve compression at the elbow.  Non operative and operative treatment options have been discussed with the patient and patient wishes to proceed with operative treatment. Risks, benefits, and alternatives of surgery have been discussed and the patient agrees with the plan of care.   Leanora Cover 07/11/2022, 1:06 PM

## 2022-07-11 NOTE — Op Note (Signed)
07/11/2022 [facility]                              OPERATIVE REPORT   PREOPERATIVE DIAGNOSIS:   Right carpal tunnel syndrome Right ulnar nerve compression at elbow  POSTOPERATIVE DIAGNOSIS:   1. Right carpal tunnel syndrome 2. Right ulnar nerve compression at elbow  PROCEDURE:   Right carpal tunnel release. Right ulnar nerve decompression at elbow  SURGEON:  Leanora Cover, MD  ASSISTANT:  Daryll Brod, MD  ANESTHESIA: General with regional  IV FLUIDS:  Per anesthesia flow sheet.  ESTIMATED BLOOD LOSS:  Minimal.  COMPLICATIONS:  None.  SPECIMENS:  None.  TOURNIQUET TIME:    Total Tourniquet Time Documented: Forearm (Right) - 36 minutes Total: Forearm (Right) - 36 minutes   DISPOSITION:  Stable to PACU.  LOCATION: [facility]  INDICATIONS: 51 year old male with numbness and tingling right hand.  Nerve conduction studies confirm carpal tunnel syndrome and ulnar nerve compression at the elbow.  He wishes to have right carpal tunnel release and ulnar nerve decompression at the elbow with possible transposition.  Risks, benefits and alternatives of surgery were discussed including the risk of blood loss; infection; damage to nerves, vessels, tendons, ligaments, bone; failure of surgery; need for additional surgery; complications with wound healing; continued pain; recurrence of carpal tunnel syndrome; and damage to motor branch. He voiced understanding of these risks and elected to proceed.   OPERATIVE COURSE:  After being identified preoperatively by myself, the patient and I agreed upon the procedure and site of procedure.  The surgical site was marked.  Surgical consent had been signed.  He was given IV Ancef as preoperative antibiotic prophylaxis.  He was transferred to the operating room and placed on the operating room table in supine position with the right upper extremity on an armboard.  General anesthesia was induced by the anesthesiologist.  and A regional block had been performed by anesthesia in preoperative holding.    Right upper extremity was prepped and draped in normal sterile orthopaedic fashion.  A surgical pause was performed between the surgeons, anesthesia, and operating room staff, and all were in agreement as to the patient, procedure, and site of procedure.  Tourniquet at the proximal aspect of the extremity was inflated to 250 mmHg after exsanguination of the arm with an Esmarch bandage  Incision was made over the transverse carpal ligament and carried into the subcutaneous tissues by spreading technique.  Bipolar electrocautery was used to obtain hemostasis.  The palmar fascia was sharply incised.  The transverse carpal ligament was identified.  The fascia distal to the ligament was opened.  Retractor was placed and the flexor tendons were identified.  The flexor tendon to the ring finger was identified and retracted radially.  The transverse carpal ligament was then incised from distal to proximal under direct visualization.  Scissors were used to split the distal aspect of the volar antebrachial fascia.  A finger was placed into the wound to ensure complete decompression, which was the case.  The nerve was examined.  It was adherent to the radial leaflet.  The motor branch was identified and was intact.  The wound was copiously irrigated with sterile saline.  It was then closed with 4-0 nylon in a horizontal mattress fashion.  It was injected with 0.25% plain Marcaine to aid in postoperative analgesia.  Incision was then made at the medial side of the elbow.  This was carried in subcutaneous tissues by spreading technique.  Bipolar electrocautery is used to obtain hemostasis.  The ulnar nerve was identified proximal to Osborne's ligament.  Osborne's ligament was then sharply incised from proximal to distal under direct visualization while protecting the ulnar nerve.  The muscular fascia over the FCU muscle was released.  The muscle  bellies were spread.  The investing fascia over the nerve was released under direct visualization while protecting the nerve with a nerve slide.  The nerve was then decompressed proximally.  Again the muscular fascia was released and any investing fascia over the nerve was released.  The elbow was flexed.  The nerve did not subluxate of the groove.  The wound was copiously irrigated with sterile saline.  The anterior leaflet of Osborne's ligament was repaired to the posterior subcutaneous tissues to provide a bolster.  There was no compression created over the nerve.  Inverted interrupted 4-0 Vicryl suture was used in subcutaneous tissues and skin was closed with 4-0 nylon in a horizontal mattress fashion.  The wounds were then dressed with sterile Xeroform and 4 x 4's and ABD and wrapped with Kerlix and Ace bandage.  Tourniquet was deflated at 36 minutes.  Fingertips were pink with brisk capillary refill after deflation of the tourniquet.  Operative drapes were broken down.  The patient was awoken from anesthesia safely.  He was transferred back to stretcher and taken to the PACU in stable condition.  I will see him back in the office in 1 week for postoperative followup.  I will give him a prescription for Norco 5/325 1-2 tabs PO q6 hours prn pain, dispense # 20.    Betha Loa, MD Electronically signed, 07/11/22

## 2022-07-11 NOTE — Anesthesia Preprocedure Evaluation (Signed)
Anesthesia Evaluation  Patient identified by MRN, date of birth, ID band Patient awake    Reviewed: Allergy & Precautions, NPO status , Patient's Chart, lab work & pertinent test results  Airway Mallampati: II  TM Distance: >3 FB Neck ROM: Full    Dental no notable dental hx.    Pulmonary neg pulmonary ROS,    Pulmonary exam normal breath sounds clear to auscultation       Cardiovascular hypertension, + CAD, + Past MI and + Cardiac Stents  Normal cardiovascular exam Rhythm:Regular Rate:Normal     Neuro/Psych negative neurological ROS  negative psych ROS   GI/Hepatic negative GI ROS, Neg liver ROS,   Endo/Other  diabetes, Type 2Morbid obesity  Renal/GU negative Renal ROS  negative genitourinary   Musculoskeletal negative musculoskeletal ROS (+)   Abdominal   Peds negative pediatric ROS (+)  Hematology negative hematology ROS (+)   Anesthesia Other Findings   Reproductive/Obstetrics negative OB ROS                             Anesthesia Physical Anesthesia Plan  ASA: 3  Anesthesia Plan: General   Post-op Pain Management: Regional block*   Induction: Intravenous  PONV Risk Score and Plan: 2 and Ondansetron, Dexamethasone and Treatment may vary due to age or medical condition  Airway Management Planned: LMA  Additional Equipment:   Intra-op Plan:   Post-operative Plan: Extubation in OR  Informed Consent: I have reviewed the patients History and Physical, chart, labs and discussed the procedure including the risks, benefits and alternatives for the proposed anesthesia with the patient or authorized representative who has indicated his/her understanding and acceptance.     Dental advisory given  Plan Discussed with: CRNA and Surgeon  Anesthesia Plan Comments:         Anesthesia Quick Evaluation

## 2022-07-11 NOTE — Discharge Instructions (Addendum)
  Post Anesthesia Home Care Instructions  Activity: Get plenty of rest for the remainder of the day. A responsible individual must stay with you for 24 hours following the procedure.  For the next 24 hours, DO NOT: -Drive a car -Paediatric nurse -Drink alcoholic beverages -Take any medication unless instructed by your physician -Make any legal decisions or sign important papers.  Meals: Start with liquid foods such as gelatin or soup. Progress to regular foods as tolerated. Avoid greasy, spicy, heavy foods. If nausea and/or vomiting occur, drink only clear liquids until the nausea and/or vomiting subsides. Call your physician if vomiting continues.  Special Instructions/Symptoms: Your throat may feel dry or sore from the anesthesia or the breathing tube placed in your throat during surgery. If this causes discomfort, gargle with warm salt water. The discomfort should disappear within 24 hours.  If you had a scopolamine patch placed behind your ear for the management of post- operative nausea and/or vomiting:  1. The medication in the patch is effective for 72 hours, after which it should be removed.  Wrap patch in a tissue and discard in the trash. Wash hands thoroughly with soap and water. 2. You may remove the patch earlier than 72 hours if you experience unpleasant side effects which may include dry mouth, dizziness or visual disturbances. 3. Avoid touching the patch. Wash your hands with soap and water after contact with the patch.    Regional Anesthesia Blocks  1. Numbness or the inability to move the "blocked" extremity may last from 3-48 hours after placement. The length of time depends on the medication injected and your individual response to the medication. If the numbness is not going away after 48 hours, call your surgeon.  2. The extremity that is blocked will need to be protected until the numbness is gone and the  Strength has returned. Because you cannot feel it, you will  need to take extra care to avoid injury. Because it may be weak, you may have difficulty moving it or using it. You may not know what position it is in without looking at it while the block is in effect.  3. For blocks in the legs and feet, returning to weight bearing and walking needs to be done carefully. You will need to wait until the numbness is entirely gone and the strength has returned. You should be able to move your leg and foot normally before you try and bear weight or walk. You will need someone to be with you when you first try to ensure you do not fall and possibly risk injury.  4. Bruising and tenderness at the needle site are common side effects and will resolve in a few days.  5. Persistent numbness or new problems with movement should be communicated to the surgeon or the [facility] [phone] [place] [phone]).   Stop instructions

## 2022-07-11 NOTE — Anesthesia Procedure Notes (Signed)
Anesthesia Procedure Image    

## 2022-07-11 NOTE — Transfer of Care (Signed)
Immediate Anesthesia Transfer of Care Note  Patient: [name] Merritt  Procedure(s) Performed: RIGHT CARPAL TUNNEL RELEASE (Right: Wrist) RIGHT ULNAR NERVE DECOMPRESSION CUBITAL TUNNEL WITH POSSIBLE TRANSPOSITION (Right: Wrist)  Patient Location: PACU  Anesthesia Type:General and Regional  Level of Consciousness: awake, alert  and oriented  Airway & Oxygen Therapy: Patient Spontanous Breathing and Patient connected to face mask oxygen  Post-op Assessment: Report given to RN and Post -op Vital signs reviewed and stable  Post vital signs: Reviewed and stable  Last Vitals:  Vitals Value Taken Time  BP 123/82 07/11/22 1500  Temp    Pulse 46 07/11/22 1502  Resp 19 07/11/22 1502  SpO2 97 % 07/11/22 1502  Vitals shown include unvalidated device data.  Last Pain:  Vitals:   07/11/22 1202  TempSrc: Oral  PainSc: 4       Patients Stated Pain Goal: 6 (35/67/01 4103)  Complications: No notable events documented.

## 2022-07-11 NOTE — Op Note (Signed)
I assisted Surgeon(s) and Role:    * Leanora Cover, MD - Primary    * Daryll Brod, MD - Assisting on the Procedure(s): RIGHT CARPAL TUNNEL RELEASE RIGHT ULNAR NERVE DECOMPRESSION CUBITAL TUNNEL WITH POSSIBLE TRANSPOSITION on 07/11/2022.  I provided assistance on this case as follows: Set up, approach, incision for the carpal tunnel release with retraction identification and release of the median nerve followed by closure of the wound. Positioning, approach, action for identification of the ulnar nerve at the elbow, action for release of the nerve proximally and distally, [place] of the wound and application of the dressing.  Electronically signed by: Daryll Brod, MD Date: 07/11/2022 Time: 2:54 PM

## 2022-07-12 ENCOUNTER — Encounter (HOSPITAL_BASED_OUTPATIENT_CLINIC_OR_DEPARTMENT_OTHER): Payer: Self-pay | Admitting: Orthopedic Surgery

## 2022-07-16 DIAGNOSIS — H2512 Age-related nuclear cataract, left eye: Secondary | ICD-10-CM | POA: Diagnosis not present

## 2022-07-20 DIAGNOSIS — H25812 Combined forms of age-related cataract, left eye: Secondary | ICD-10-CM | POA: Diagnosis not present

## 2022-07-20 DIAGNOSIS — H268 Other specified cataract: Secondary | ICD-10-CM | POA: Diagnosis not present

## 2022-07-24 ENCOUNTER — Other Ambulatory Visit (HOSPITAL_COMMUNITY): Payer: Self-pay

## 2022-07-26 DIAGNOSIS — E113512 Type 2 diabetes mellitus with proliferative diabetic retinopathy with macular edema, left eye: Secondary | ICD-10-CM | POA: Diagnosis not present

## 2022-07-28 ENCOUNTER — Other Ambulatory Visit (HOSPITAL_COMMUNITY): Payer: Self-pay

## 2022-07-28 DIAGNOSIS — E113593 Type 2 diabetes mellitus with proliferative diabetic retinopathy without macular edema, bilateral: Secondary | ICD-10-CM | POA: Diagnosis not present

## 2022-07-28 DIAGNOSIS — G4733 Obstructive sleep apnea (adult) (pediatric): Secondary | ICD-10-CM | POA: Diagnosis not present

## 2022-07-28 DIAGNOSIS — E7800 Pure hypercholesterolemia, unspecified: Secondary | ICD-10-CM | POA: Diagnosis not present

## 2022-07-28 DIAGNOSIS — Z125 Encounter for screening for malignant neoplasm of prostate: Secondary | ICD-10-CM | POA: Diagnosis not present

## 2022-07-28 DIAGNOSIS — I10 Essential (primary) hypertension: Secondary | ICD-10-CM | POA: Diagnosis not present

## 2022-07-28 DIAGNOSIS — Z23 Encounter for immunization: Secondary | ICD-10-CM | POA: Diagnosis not present

## 2022-07-28 DIAGNOSIS — I2510 Atherosclerotic heart disease of native coronary artery without angina pectoris: Secondary | ICD-10-CM | POA: Diagnosis not present

## 2022-07-28 DIAGNOSIS — Z0000 Encounter for general adult medical examination without abnormal findings: Secondary | ICD-10-CM | POA: Diagnosis not present

## 2022-07-28 DIAGNOSIS — I255 Ischemic cardiomyopathy: Secondary | ICD-10-CM | POA: Diagnosis not present

## 2022-07-28 MED ORDER — OZEMPIC (0.25 OR 0.5 MG/DOSE) 2 MG/3ML ~~LOC~~ SOPN
0.2500 mg | PEN_INJECTOR | SUBCUTANEOUS | 5 refills | Status: AC
  Filled 2022-07-28: qty 3, 56d supply, fill #0
  Filled 2022-10-16: qty 3, 56d supply, fill #1
  Filled 2022-12-10: qty 3, 56d supply, fill #2
  Filled 2023-02-03 – 2023-02-05 (×3): qty 3, 56d supply, fill #3

## 2022-07-31 ENCOUNTER — Other Ambulatory Visit: Payer: Self-pay | Admitting: Orthopedic Surgery

## 2022-08-01 ENCOUNTER — Telehealth: Payer: Self-pay | Admitting: Cardiovascular Disease

## 2022-08-01 NOTE — Telephone Encounter (Signed)
   [facility] Pre-operative Risk Assessment    Request for surgical clearance:  What type of surgery is being performed?  Left Carpatunel Release Right Ulnar Nerve Decompression Cubical Tunnel with Possible Transposition  When is this surgery scheduled?  08/17/22   What type of clearance is required (medical clearance vs. Pharmacy clearance to hold med vs. Both)?  Both   Are there any medications that need to be held prior to surgery and how long? Plavix/Eliquis, their office is requesting our recommendation regarding these medications   Practice name and name of physician performing surgery?  [facility]  Dr. Leanora Cover   What is your office phone number? [phone]   7.   What is your office fax number? [phone]  8.   Anesthesia type (None, local, MAC, general) ?  Axillary Block    [name] Merritt 08/01/2022, 9:47 AM  _________________________________________________________________   (provider comments below)

## 2022-08-01 NOTE — Telephone Encounter (Signed)
   Primary Cardiologist: Shelva Majestic, MD  Chart reviewed as part of pre-operative protocol coverage. Given past medical history and time since last visit, based on ACC/AHA guidelines, [name] Merritt would be at acceptable risk for the planned procedure without further cardiovascular testing.   Called and spoke with patient. He has had no concerning symptoms since preoperative clearance was granted on 07/07/22. He was advised that if he develops new symptoms prior to surgery to contact our office to arrange a follow-up appointment. He verbalized understanding.  Per office protocol, patient can hold Eliquis for 2 days prior to procedure. He may hold Plavix for 5 days prior to procedure. He should resume both medications as soon as hemodynamically stable following the procedure.   I will route this recommendation to the requesting party via Epic fax function and remove from pre-op pool.  Please call with questions.  Emmaline Life, NP-C  08/01/2022, 11:52 AM [address], [address] Office [phone] Fax [phone]

## 2022-08-08 ENCOUNTER — Encounter (HOSPITAL_BASED_OUTPATIENT_CLINIC_OR_DEPARTMENT_OTHER): Payer: Self-pay | Admitting: Orthopedic Surgery

## 2022-08-08 ENCOUNTER — Other Ambulatory Visit: Payer: Self-pay

## 2022-08-08 NOTE — Progress Notes (Signed)
   08/08/22 1343  Pre-op Phone Call  Surgery Date Verified 08/17/22  Arrival Time Verified 0845  Surgery Location Verified [facility]  Medical History Reviewed Yes  Does the patient have diabetes? Type II  Does the patient use a Continuous Blood Glucose Monitor? No  Is the patient on an insulin pump? No  Has the diabetes coordinator been notified? No  Do you have a history of heart problems? Yes;Cardiologist  Antiarrhythmic device type  (n/a)  Patient educated on enhanced recovery. Yes  Patient educated about smoking cessation 24 hours prior to surgery. N/A Non-Smoker  Patient verbalizes understanding of bowel prep? N/A  Med Rec Completed Yes  Take the Following Meds the Morning of Surgery amlodipine, zyrtec, metoprolol, protonix ok day of surgery; hold ozempic 7 days prior to surgery; hold vitamins/supplements 5 days prior to surgery; hold plavix 5 days prior to surgery per cardiology note; hold eliquis 2 days prior to surgery per cardiology note  Recent  Lab Work, EKG, CXR? Yes  NPO (Including gum & candy) After midnight  Patient instructed to stop clear liquids including Carb loading drink at: 0645  Stop Solids, Milk, Candy, and Gum STARTING AT MIDNIGHT  Did patient view EMMI videos? No  Responsible adult to drive and be with you for 24 hours? Yes  Name & Phone Number for Ride/Caregiver Renee Buntin (significant other)  No Jewelry, money, nail polish or make-up.  No lotions, powders, perfumes. No shaving  48 hrs. prior to surgery. Yes  Contacts, Dentures & Glasses Will Have to be Removed Before OR. Yes  Please bring your ID and Insurance Card the morning of your surgery. (Surgery Centers Only) Yes  Bring any papers or x-rays with you that your surgeon gave you. Yes  Instructed to contact the location of procedure/ provider if they or anyone in their household develops symptoms or tests positive for COVID-19, has close contact with someone who tests positive for COVID, or has known  exposure to any contagious illness. Yes  Call this number the morning of surgery  with any problems that may cancel your surgery. [phone]  Covid-19 Assessment  Have you had a positive COVID-19 test within the previous 90 days? No  COVID Testing Guidance Proceed with the additional questions.  Patient's surgery required a COVID-19 test (cardiothoracic, complex ENT, and bronchoscopies/EBUS) No

## 2022-08-08 NOTE — Progress Notes (Signed)
   08/08/22 1343  PAT Phone Screen  Do You Have Diabetes? Yes  Do You Have Hypertension? Yes  Have You Ever Been to the ER for Asthma? No  Have You Taken Oral Steroids in the Past 3 Months? No  Do you Take Phenteramine or any Other Diet Drugs? No  Recent  Lab Work, EKG, CXR? Yes  Where was this test performed? EKG 03/08/22 EF 55-60%  Do you have a history of heart problems? Yes;Cardiologist  Have You Ever Had Tests on Your Heart? Yes  Where? ECHO EF 55-60%  Any Recent Hospitalizations? No  Height 6\' 1"  (1.854 m)  Weight (!) 137.4 kg  Pat Appointment Scheduled Yes

## 2022-08-09 DIAGNOSIS — E113511 Type 2 diabetes mellitus with proliferative diabetic retinopathy with macular edema, right eye: Secondary | ICD-10-CM | POA: Diagnosis not present

## 2022-08-14 DIAGNOSIS — H26491 Other secondary cataract, right eye: Secondary | ICD-10-CM | POA: Diagnosis not present

## 2022-08-17 ENCOUNTER — Ambulatory Visit (HOSPITAL_BASED_OUTPATIENT_CLINIC_OR_DEPARTMENT_OTHER): Admission: RE | Admit: 2022-08-17 | Payer: 59 | Source: Home / Self Care | Admitting: Orthopedic Surgery

## 2022-08-17 DIAGNOSIS — E119 Type 2 diabetes mellitus without complications: Secondary | ICD-10-CM

## 2022-08-17 SURGERY — CARPAL TUNNEL RELEASE
Anesthesia: Regional | Laterality: Left

## 2022-08-21 ENCOUNTER — Encounter (HOSPITAL_BASED_OUTPATIENT_CLINIC_OR_DEPARTMENT_OTHER)
Admission: RE | Admit: 2022-08-21 | Discharge: 2022-08-21 | Disposition: A | Discharge status: Home / Self Care | Payer: 59 | Source: Ambulatory Visit | Attending: Orthopedic Surgery | Admitting: Orthopedic Surgery

## 2022-08-21 ENCOUNTER — Other Ambulatory Visit (HOSPITAL_COMMUNITY): Payer: Self-pay

## 2022-08-21 DIAGNOSIS — E119 Type 2 diabetes mellitus without complications: Secondary | ICD-10-CM | POA: Diagnosis not present

## 2022-08-21 DIAGNOSIS — Z01812 Encounter for preprocedural laboratory examination: Secondary | ICD-10-CM | POA: Insufficient documentation

## 2022-08-21 LAB — BASIC METABOLIC PANEL
Anion gap: 12 (ref 5–15)
BUN: 18 mg/dL (ref 6–20)
CO2: 23 mmol/L (ref 22–32)
Calcium: 9.3 mg/dL (ref 8.9–10.3)
Chloride: 104 mmol/L (ref 98–111)
Creatinine, Ser: 1.1 mg/dL (ref 0.61–1.24)
GFR, Estimated: >60 mL/min (ref >60)
Glucose, Bld: 178 mg/dL — ABNORMAL HIGH (ref 70–99)
Potassium: 5 mmol/L (ref 3.5–5.1)
Sodium: 139 mmol/L (ref 135–145)

## 2022-08-21 NOTE — Progress Notes (Signed)

## 2022-08-24 ENCOUNTER — Encounter (HOSPITAL_BASED_OUTPATIENT_CLINIC_OR_DEPARTMENT_OTHER)
Admission: RE | Disposition: A | Discharge status: Home / Self Care | Payer: Self-pay | Source: Home / Self Care | Attending: Orthopedic Surgery

## 2022-08-24 ENCOUNTER — Other Ambulatory Visit: Payer: Self-pay

## 2022-08-24 ENCOUNTER — Ambulatory Visit (HOSPITAL_BASED_OUTPATIENT_CLINIC_OR_DEPARTMENT_OTHER): Payer: 59 | Admitting: Anesthesiology

## 2022-08-24 ENCOUNTER — Ambulatory Visit (HOSPITAL_BASED_OUTPATIENT_CLINIC_OR_DEPARTMENT_OTHER)
Admission: RE | Admit: 2022-08-24 | Discharge: 2022-08-24 | Disposition: A | Discharge status: Home / Self Care | Payer: 59 | Attending: Orthopedic Surgery | Admitting: Orthopedic Surgery

## 2022-08-24 ENCOUNTER — Encounter (HOSPITAL_BASED_OUTPATIENT_CLINIC_OR_DEPARTMENT_OTHER): Payer: Self-pay | Admitting: Orthopedic Surgery

## 2022-08-24 ENCOUNTER — Other Ambulatory Visit (HOSPITAL_COMMUNITY): Payer: Self-pay

## 2022-08-24 DIAGNOSIS — F419 Anxiety disorder, unspecified: Secondary | ICD-10-CM | POA: Insufficient documentation

## 2022-08-24 DIAGNOSIS — G5602 Carpal tunnel syndrome, left upper limb: Secondary | ICD-10-CM

## 2022-08-24 DIAGNOSIS — G4730 Sleep apnea, unspecified: Secondary | ICD-10-CM | POA: Insufficient documentation

## 2022-08-24 DIAGNOSIS — G5622 Lesion of ulnar nerve, left upper limb: Secondary | ICD-10-CM | POA: Diagnosis not present

## 2022-08-24 DIAGNOSIS — E119 Type 2 diabetes mellitus without complications: Secondary | ICD-10-CM | POA: Insufficient documentation

## 2022-08-24 DIAGNOSIS — Z7984 Long term (current) use of oral hypoglycemic drugs: Secondary | ICD-10-CM | POA: Diagnosis not present

## 2022-08-24 DIAGNOSIS — I252 Old myocardial infarction: Secondary | ICD-10-CM | POA: Insufficient documentation

## 2022-08-24 DIAGNOSIS — K219 Gastro-esophageal reflux disease without esophagitis: Secondary | ICD-10-CM | POA: Insufficient documentation

## 2022-08-24 DIAGNOSIS — G8918 Other acute postprocedural pain: Secondary | ICD-10-CM | POA: Diagnosis not present

## 2022-08-24 DIAGNOSIS — I2510 Atherosclerotic heart disease of native coronary artery without angina pectoris: Secondary | ICD-10-CM

## 2022-08-24 DIAGNOSIS — I110 Hypertensive heart disease with heart failure: Secondary | ICD-10-CM

## 2022-08-24 DIAGNOSIS — I509 Heart failure, unspecified: Secondary | ICD-10-CM | POA: Insufficient documentation

## 2022-08-24 HISTORY — PX: ULNAR NERVE TRANSPOSITION: SHX2595

## 2022-08-24 HISTORY — PX: CARPAL TUNNEL RELEASE: SHX101

## 2022-08-24 LAB — GLUCOSE, CAPILLARY
Glucose-Capillary: 135 mg/dL — ABNORMAL HIGH (ref 70–99)
Glucose-Capillary: 116 mg/dL — ABNORMAL HIGH (ref 70–99)

## 2022-08-24 SURGERY — CARPAL TUNNEL RELEASE
Anesthesia: General | Site: Wrist | Laterality: Left

## 2022-08-24 MED ORDER — OXYCODONE HCL 5 MG PO TABS: 5.0000 mg | ORAL_TABLET | Freq: Once | ORAL | Status: DC | PRN

## 2022-08-24 MED ORDER — BUPIVACAINE-EPINEPHRINE (PF) 0.5% -1:200000 IJ SOLN
INTRAMUSCULAR | Status: DC | PRN
  Administered 2022-08-24: 30 mL via PERINEURAL

## 2022-08-24 MED ORDER — AMISULPRIDE (ANTIEMETIC) 5 MG/2ML IV SOLN: 10.0000 mg | Freq: Once | INTRAVENOUS | Status: DC | PRN

## 2022-08-24 MED ORDER — LACTATED RINGERS IV SOLN: INTRAVENOUS | Status: DC

## 2022-08-24 MED ORDER — PROMETHAZINE HCL 25 MG/ML IJ SOLN: 6.2500 mg | INTRAMUSCULAR | Status: DC | PRN

## 2022-08-24 MED ORDER — ONDANSETRON HCL 4 MG/2ML IJ SOLN
INTRAMUSCULAR | Status: DC | PRN
  Administered 2022-08-24: 4 mg via INTRAVENOUS

## 2022-08-24 MED ORDER — DEXTROSE 5 % IV SOLN
INTRAVENOUS | Status: DC | PRN
  Administered 2022-08-24: 3 g via INTRAVENOUS

## 2022-08-24 MED ORDER — MIDAZOLAM HCL 2 MG/2ML IJ SOLN
2.0000 mg | Freq: Once | INTRAMUSCULAR | Status: AC
  Administered 2022-08-24: 2 mg via INTRAVENOUS

## 2022-08-24 MED ORDER — DEXAMETHASONE SODIUM PHOSPHATE 10 MG/ML IJ SOLN
INTRAMUSCULAR | Status: DC | PRN
  Administered 2022-08-24: 5 mg via INTRAVENOUS

## 2022-08-24 MED ORDER — FENTANYL CITRATE (PF) 100 MCG/2ML IJ SOLN
INTRAMUSCULAR | Status: AC
  Filled 2022-08-24: qty 2

## 2022-08-24 MED ORDER — HYDROCODONE-ACETAMINOPHEN 5-325 MG PO TABS
1.0000 | ORAL_TABLET | Freq: Four times a day (QID) | ORAL | 0 refills | Status: DC | PRN
  Filled 2022-08-24: qty 20, 3d supply, fill #0

## 2022-08-24 MED ORDER — ACETAMINOPHEN 10 MG/ML IV SOLN: 1000.0000 mg | Freq: Once | INTRAVENOUS | Status: DC | PRN

## 2022-08-24 MED ORDER — ACETAMINOPHEN 325 MG PO TABS: 325.0000 mg | ORAL_TABLET | ORAL | Status: DC | PRN

## 2022-08-24 MED ORDER — PROPOFOL 10 MG/ML IV BOLUS
INTRAVENOUS | Status: DC | PRN
  Administered 2022-08-24: 280 mg via INTRAVENOUS

## 2022-08-24 MED ORDER — HYDROCODONE-ACETAMINOPHEN 5-325 MG PO TABS
ORAL_TABLET | ORAL | 0 refills | Status: DC
  Filled 2022-08-24: qty 15, fill #0
  Filled 2022-08-24: qty 15, 2d supply, fill #0

## 2022-08-24 MED ORDER — FENTANYL CITRATE (PF) 100 MCG/2ML IJ SOLN
100.0000 ug | Freq: Once | INTRAMUSCULAR | Status: AC
  Administered 2022-08-24: 100 ug via INTRAVENOUS

## 2022-08-24 MED ORDER — CEFAZOLIN IN SODIUM CHLORIDE 3-0.9 GM/100ML-% IV SOLN
INTRAVENOUS | Status: AC
  Filled 2022-08-24: qty 100

## 2022-08-24 MED ORDER — BUPIVACAINE HCL (PF) 0.25 % IJ SOLN
INTRAMUSCULAR | Status: AC
  Filled 2022-08-24: qty 30

## 2022-08-24 MED ORDER — LIDOCAINE HCL (CARDIAC) PF 100 MG/5ML IV SOSY
PREFILLED_SYRINGE | INTRAVENOUS | Status: DC | PRN
  Administered 2022-08-24: 40 mg via INTRAVENOUS

## 2022-08-24 MED ORDER — FENTANYL CITRATE (PF) 100 MCG/2ML IJ SOLN: 25.0000 ug | INTRAMUSCULAR | Status: DC | PRN

## 2022-08-24 MED ORDER — CEFAZOLIN IN SODIUM CHLORIDE 3-0.9 GM/100ML-% IV SOLN: 3.0000 g | INTRAVENOUS | Status: DC

## 2022-08-24 MED ORDER — OXYCODONE HCL 5 MG/5ML PO SOLN: 5.0000 mg | Freq: Once | ORAL | Status: DC | PRN

## 2022-08-24 MED ORDER — ACETAMINOPHEN 160 MG/5ML PO SOLN: 325.0000 mg | ORAL | Status: DC | PRN

## 2022-08-24 MED ORDER — EPHEDRINE SULFATE (PRESSORS) 50 MG/ML IJ SOLN
INTRAMUSCULAR | Status: DC | PRN
  Administered 2022-08-24 (×2): 10 mg via INTRAVENOUS

## 2022-08-24 MED ORDER — MIDAZOLAM HCL 2 MG/2ML IJ SOLN
INTRAMUSCULAR | Status: AC
  Filled 2022-08-24: qty 2

## 2022-08-24 SURGICAL SUPPLY — 54 items
APL PRP STRL LF DISP 70% ISPRP (MISCELLANEOUS) ×2
BLADE MINI RND TIP GREEN BEAV (BLADE) IMPLANT
BLADE SURG 15 STRL LF DISP TIS (BLADE) ×4 IMPLANT
BLADE SURG 15 STRL SS (BLADE) ×4
BNDG CMPR 9X4 STRL LF SNTH (GAUZE/BANDAGES/DRESSINGS) ×2
BNDG ELASTIC 3X5.8 VLCR STR LF (GAUZE/BANDAGES/DRESSINGS) ×4 IMPLANT
BNDG ELASTIC 4X5.8 VLCR STR LF (GAUZE/BANDAGES/DRESSINGS) ×2 IMPLANT
BNDG ESMARK 4X9 LF (GAUZE/BANDAGES/DRESSINGS) ×2 IMPLANT
BNDG GAUZE DERMACEA FLUFF 4 (GAUZE/BANDAGES/DRESSINGS) ×2 IMPLANT
BNDG GZE DERMACEA 4 6PLY (GAUZE/BANDAGES/DRESSINGS) ×2
CHLORAPREP W/TINT 26 (MISCELLANEOUS) ×2 IMPLANT
CORD BIPOLAR FORCEPS 12FT (ELECTRODE) ×2 IMPLANT
COVER BACK TABLE 60X90IN (DRAPES) ×2 IMPLANT
COVER MAYO STAND STRL (DRAPES) ×2 IMPLANT
CUFF TOURN SGL QUICK 18X3 (MISCELLANEOUS) ×2 IMPLANT
CUFF TOURN SGL QUICK 18X4 (TOURNIQUET CUFF) ×2 IMPLANT
CUFF TOURN SGL QUICK 24 (TOURNIQUET CUFF) ×2
CUFF TRNQT CYL 24X4X16.5-23 (TOURNIQUET CUFF) IMPLANT
DRAPE EXTREMITY T 121X128X90 (DISPOSABLE) ×2 IMPLANT
DRAPE SURG 17X23 STRL (DRAPES) ×2 IMPLANT
GAUZE 4X4 16PLY ~~LOC~~+RFID DBL (SPONGE) IMPLANT
GAUZE PAD ABD 8X10 STRL (GAUZE/BANDAGES/DRESSINGS) ×2 IMPLANT
GAUZE SPONGE 4X4 12PLY STRL (GAUZE/BANDAGES/DRESSINGS) ×2 IMPLANT
GAUZE XEROFORM 1X8 LF (GAUZE/BANDAGES/DRESSINGS) ×2 IMPLANT
GLOVE BIO SURGEON STRL SZ7.5 (GLOVE) ×2 IMPLANT
GLOVE BIOGEL PI IND STRL 8 (GLOVE) ×2 IMPLANT
GLOVE BIOGEL PI IND STRL 8.5 (GLOVE) ×2 IMPLANT
GLOVE SURG ORTHO 8.0 STRL STRW (GLOVE) ×2 IMPLANT
GOWN STRL REUS W/ TWL LRG LVL3 (GOWN DISPOSABLE) ×2 IMPLANT
GOWN STRL REUS W/TWL LRG LVL3 (GOWN DISPOSABLE) ×2
GOWN STRL REUS W/TWL XL LVL3 (GOWN DISPOSABLE) ×4 IMPLANT
NDL HYPO 25X1 1.5 SAFETY (NEEDLE) ×2 IMPLANT
NEEDLE HYPO 25X1 1.5 SAFETY (NEEDLE) ×2 IMPLANT
NS IRRIG 1000ML POUR BTL (IV SOLUTION) ×2 IMPLANT
PACK BASIN DAY SURGERY FS (CUSTOM PROCEDURE TRAY) ×2 IMPLANT
PAD CAST 3X4 CTTN HI CHSV (CAST SUPPLIES) ×2 IMPLANT
PAD CAST 4YDX4 CTTN HI CHSV (CAST SUPPLIES) ×2 IMPLANT
PADDING CAST ABS COTTON 4X4 ST (CAST SUPPLIES) ×2 IMPLANT
PADDING CAST COTTON 3X4 STRL (CAST SUPPLIES) ×2
PADDING CAST COTTON 4X4 STRL (CAST SUPPLIES) ×2
SLEEVE SCD COMPRESS KNEE MED (STOCKING) ×2 IMPLANT
SLING ARM FOAM STRAP XLG (SOFTGOODS) IMPLANT
SPIKE FLUID TRANSFER (MISCELLANEOUS) IMPLANT
SPLINT PLASTER CAST FAST 5X30 (CAST SUPPLIES) IMPLANT
SPLINT PLASTER CAST XFAST 3X15 (CAST SUPPLIES) IMPLANT
STOCKINETTE 4X48 STRL (DRAPES) ×2 IMPLANT
SUT ETHILON 4 0 PS 2 18 (SUTURE) ×2 IMPLANT
SUT VIC AB 2-0 SH 27 (SUTURE) ×2
SUT VIC AB 2-0 SH 27XBRD (SUTURE) ×2 IMPLANT
SUT VICRYL 4-0 PS2 18IN ABS (SUTURE) IMPLANT
SYR BULB EAR ULCER 3OZ GRN STR (SYRINGE) ×2 IMPLANT
SYR CONTROL 10ML LL (SYRINGE) ×2 IMPLANT
TOWEL GREEN STERILE FF (TOWEL DISPOSABLE) ×4 IMPLANT
UNDERPAD 30X36 HEAVY ABSORB (UNDERPADS AND DIAPERS) ×2 IMPLANT

## 2022-08-24 NOTE — Op Note (Signed)
I assisted Surgeon(s) and Role:    * Leanora Cover, MD - Primary    Daryll Brod, MD - Assisting on the Procedure(s): LEFT CARPAL [place] on 08/24/2022.  I provided assistance on this case as follows: Set up, approach, identification and release of the median nerve at the wrist. Approach identification isolation and retraction for decompression to the ulnar nerve at the elbow, closure of the wound and application of the dressing.  Electronically signed by: Daryll Brod, MD Date: 08/24/2022 Time: 2:08 PM

## 2022-08-24 NOTE — Progress Notes (Signed)
Assisted Dr. Hollis with left, interscalene , ultrasound guided block. Side rails up, monitors on throughout procedure. See vital signs in flow sheet. Tolerated Procedure well. 

## 2022-08-24 NOTE — H&P (Signed)
[name] Merritt is an 51 y.o. male.   Chief Complaint: hand numbness HPI: 51 yo male with numbness and tingling left hand.  Positive nerve conduction studies.  He wishes to have left carpal tunnel release and ulnar nerve decompression at elbow with transposition as necessary.  Allergies:  Allergies  Allergen Reactions   Pineapple Itching and Other (See Comments)    All-over itching (mouth, too)   Tomato Itching and Other (See Comments)    All-over itching (mouth, too) Ketchup, also   Rosuvastatin Calcium Hives and Rash    Past Medical History:  Diagnosis Date   Anxiety    CHF (congestive heart failure) (HCC)    Coronary artery disease    Diabetes mellitus without complication (HCC)    type II   GERD (gastroesophageal reflux disease)    not currently   High cholesterol    Hypertension    Myocardial infarction (HCC) 05/30/2020   New onset atrial flutter (HCC) 06/09/2020   Overweight    Sleep apnea     Past Surgical History:  Procedure Laterality Date   CARDIOVERSION N/A 06/10/2020   Procedure: CARDIOVERSION;  Surgeon: Little Ishikawa, MD;  Location: [facility] ENDOSCOPY;  Service: Cardiovascular;  Laterality: N/A;   CARPAL TUNNEL RELEASE Right 07/11/2022   Procedure: RIGHT CARPAL TUNNEL RELEASE;  Surgeon: Betha Loa, MD;  Location: [facility];  Service: Orthopedics;  Laterality: Right;  AXILLARY BLOCK   CORONARY STENT INTERVENTION N/A 05/30/2020   Procedure: CORONARY STENT INTERVENTION;  Surgeon: Lennette Bihari, MD;  Location: [facility] INVASIVE CV LAB;  Service: Cardiovascular;  Laterality: N/A;   CORONARY/GRAFT ACUTE MI REVASCULARIZATION N/A 05/30/2020   Procedure: Coronary/Graft Acute MI Revascularization;  Surgeon: Lennette Bihari, MD;  Location: [facility] INVASIVE CV LAB;  Service: Cardiovascular;  Laterality: N/A;   GAS INSERTION Right 07/19/2021   Procedure: INSERTION OF GAS - C3F8;  Surgeon: Carmela Rima, MD;  Location: [facility] OR;  Service: Ophthalmology;  Laterality:  Right;   GAS/FLUID EXCHANGE Right 07/19/2021   Procedure: GAS/FLUID EXCHANGE;  Surgeon: Carmela Rima, MD;  Location: [facility] OR;  Service: Ophthalmology;  Laterality: Right;   LEFT HEART CATH AND CORONARY ANGIOGRAPHY N/A 05/30/2020   Procedure: LEFT HEART CATH AND CORONARY ANGIOGRAPHY;  Surgeon: Lennette Bihari, MD;  Location: [facility] INVASIVE CV LAB;  Service: Cardiovascular;  Laterality: N/A;   MEMBRANE PEEL Right 07/19/2021   Procedure: MEMBRANE PEEL;  Surgeon: Carmela Rima, MD;  Location: [facility] OR;  Service: Ophthalmology;  Laterality: Right;   PARS PLANA VITRECTOMY 27 GAUGE Right 09/27/2021   Procedure: PARS PLANA VITRECTOMY 27 GAUGE;  Surgeon: Carmela Rima, MD;  Location: [facility] OR;  Service: Ophthalmology;  Laterality: Right;   PHOTOCOAGULATION WITH LASER Right 07/19/2021   Procedure: PHOTOCOAGULATION WITH LASER;  Surgeon: Carmela Rima, MD;  Location: [facility] OR;  Service: Ophthalmology;  Laterality: Right;   PHOTOCOAGULATION WITH LASER Right 09/27/2021   Procedure: PHOTOCOAGULATION WITH LASER;  Surgeon: Carmela Rima, MD;  Location: [facility] OR;  Service: Ophthalmology;  Laterality: Right;   REPAIR OF COMPLEX TRACTION RETINAL DETACHMENT Right 07/19/2021   Procedure: REPAIR OF COMPLEX TRACTION RETINAL DETACHMENT;  Surgeon: Carmela Rima, MD;  Location: [facility] OR;  Service: Ophthalmology;  Laterality: Right;   TEE WITHOUT CARDIOVERSION N/A 06/10/2020   Procedure: TRANSESOPHAGEAL ECHOCARDIOGRAM (TEE);  Surgeon: Little Ishikawa, MD;  Location: [facility] ENDOSCOPY;  Service: Cardiovascular;  Laterality: N/A;   torn rotator cuff  2018   2018   ULNAR NERVE TRANSPOSITION Right 07/11/2022   Procedure: RIGHT ULNAR  NERVE DECOMPRESSION CUBITAL TUNNEL WITH POSSIBLE TRANSPOSITION;  Surgeon: Betha Loa, MD;  Location: [facility];  Service: Orthopedics;  Laterality: Right;  AXILLARY BLOCK    Family History: Family History  Problem Relation Age of Onset   Hypertension Mother    Diabetes Mother     Hypertension Father    Diabetes Father    Diabetes Maternal Grandmother    Hypertension Maternal Grandmother    Diabetes Maternal Grandfather    Hypertension Maternal Grandfather    Diabetes Paternal Grandmother    Hypertension Paternal Grandmother    Diabetes Paternal Grandfather    Hypertension Paternal Grandfather    Asthma Neg Hx     Social History:   reports that he has never smoked. He quit smokeless tobacco use about 6 years ago.  His smokeless tobacco use included chew. He reports that he does not currently use alcohol. He reports that he does not currently use drugs after having used the following drugs: Marijuana.  Medications: Medications Prior to Admission  Medication Sig Dispense Refill   acetaminophen (TYLENOL) 650 MG CR tablet Take 1,300 mg by mouth every 8 (eight) hours as needed for pain.     amLODipine (NORVASC) 5 MG tablet Take 1 tablet (5 mg total) by mouth daily. 30 tablet 5   Ascorbic Acid (VITAMIN C) 1000 MG tablet Take 1,000 mg by mouth daily.     cetirizine (ZYRTEC) 10 MG tablet TAKE 1 TABLET(10 MG) BY MOUTH DAILY 30 tablet 5   CINNAMON PO Take 1,500 mg by mouth 2 (two) times daily.     dapagliflozin propanediol (FARXIGA) 10 MG TABS tablet Take 1 tablet (10 mg total) by mouth daily. 30 tablet 11   Evolocumab (REPATHA SURECLICK) 140 MG/ML SOAJ Inject 1 Dose into the skin every 14 (fourteen) days. 6 mL 3   HYDROcodone-acetaminophen (NORCO) 5-325 MG tablet Take 1-2 tablets by mouth every 6 (six) hours as needed for pain. 20 tablet 0   metFORMIN (GLUCOPHAGE) 1000 MG tablet Take 1 tablet (1,000 mg total) by mouth in the morning AND 0.5 tablets (500 mg total) every evening with meals 135 tablet 3   metoprolol succinate (TOPROL-XL) 25 MG 24 hr tablet Take 2 tablets (50 mg total) by mouth daily. 180 tablet 3   pantoprazole (PROTONIX) 40 MG tablet Take 1 tablet (40 mg total) by mouth 2 (two) times daily. 180 tablet 3   sacubitril-valsartan (ENTRESTO) 97-103 MG Take 1  tablet by mouth 2 (two) times daily. 60 tablet 11   spironolactone (ALDACTONE) 25 MG tablet Take 0.5 tablets (12.5 mg total) by mouth 2 (two) times daily. 30 tablet 11   traZODone (DESYREL) 100 MG tablet Take 1 tablet (100 mg total) by mouth at bedtime. 90 tablet 3   zinc gluconate 50 MG tablet Take 50 mg by mouth daily.     albuterol (VENTOLIN HFA) 108 (90 Base) MCG/ACT inhaler Inhale 2 puffs into the lungs every 6 (six) hours as needed for wheezing or shortness of breath.     apixaban (ELIQUIS) 5 MG TABS tablet Take 1 tablet (5 mg total) by mouth 2 (two) times daily. 180 tablet 1   atorvastatin (LIPITOR) 80 MG tablet Take 1 tablet (80 mg total) by mouth daily at 6 PM. 90 tablet 3   clopidogrel (PLAVIX) 75 MG tablet Take 1 tablet (75 mg total) by mouth daily. 90 tablet 3   nitroGLYCERIN (NITROSTAT) 0.4 MG SL tablet Place 1 tablet (0.4 mg total) under the tongue every 5 (  five) minutes as needed for chest pain. 25 tablet 3   polyethylene glycol-electrolytes (NULYTELY) 420 g solution Use as directed 4000 mL 0   Semaglutide,0.25 or 0.5MG /DOS, (OZEMPIC, 0.25 OR 0.5 MG/DOSE,) 2 MG/3ML SOPN Inject 0.25 mg into the skin once a week. 3 mL 5   sildenafil (REVATIO) 20 MG tablet Take 20 mg by mouth daily as needed for erectile dysfunction.      Results for orders placed or performed during the hospital encounter of 08/24/22 (from the past 48 hour(s))  Glucose, capillary     Status: Abnormal   Collection Time: 08/24/22 10:37 AM  Result Value Ref Range   Glucose-Capillary 135 (H) 70 - 99 mg/dL    Comment: Glucose reference range applies only to samples taken after fasting for at least 8 hours.    No results found.    Blood pressure (!) 150/85, pulse (!) 56, temperature 98.2 F (36.8 C), temperature source Oral, resp. rate 12, height 6\' 1"  (1.854 m), weight (!) 139.9 kg, SpO2 97 %.  General appearance: alert, cooperative, and appears stated age Head: Normocephalic, without obvious abnormality,  atraumatic Neck: supple, symmetrical, trachea midline Extremities: Intact sensation and capillary refill all digits.  +epl/fpl/io.  No wounds.  Pulses: 2+ and symmetric Skin: Skin color, texture, turgor normal. No rashes or lesions Neurologic: Grossly normal Incision/Wound: none  Assessment/Plan Left carpal tunnel and ulnar nerve compression at elbow.  Non operative and operative treatment options have been discussed with the patient and patient wishes to proceed with operative treatment. Risks, benefits, and alternatives of surgery have been discussed and the patient agrees with the plan of care.   Leanora Cover 08/24/2022, 11:14 AM

## 2022-08-24 NOTE — Op Note (Signed)
NAME: [name] Merritt MEDICAL RECORD NO: [id number] DATE OF BIRTH: [birth year] FACILITY: [facility] LOCATION:  S[facility]ERY CENTER PHYSICIAN: Tennis Must, MD   OPERATIVE REPORT   DATE OF PROCEDURE: 08/24/22    PREOPERATIVE DIAGNOSIS: Left carpal tunnel syndrome and ulnar nerve compression at the elbow   POSTOPERATIVE DIAGNOSIS: Left carpal tunnel syndrome and ulnar nerve compression at the elbow   PROCEDURE: 1.  Left carpal tunnel release 2.  Left ulnar nerve decompression at the elbow   SURGEON:  Leanora Cover, M.D.   ASSISTANT: Daryll Brod, MD   ANESTHESIA:  General with regional   INTRAVENOUS FLUIDS:  Per anesthesia flow sheet.   ESTIMATED BLOOD LOSS:  Minimal.   COMPLICATIONS:  None.   SPECIMENS:  none   TOURNIQUET TIME:   Left arm: 35 minutes at 250 mmHg   DISPOSITION:  Stable to PACU.   INDICATIONS: 51 year old male with numbness and tingling left hand.  Positive nerve conduction studies.  He wishes to have left carpal tunnel release and ulnar nerve decompression at the elbow with possible transposition.  Risks, benefits and alternatives of surgery were discussed including the risks of blood loss, infection, damage to nerves, vessels, tendons, ligaments, bone for surgery, need for additional surgery, complications with wound healing, continued pain, stiffness, , recurrence.  He voiced understanding of these risks and elected to proceed.  OPERATIVE COURSE:  After being identified preoperatively by myself,  the patient and I agreed on the procedure and site of the procedure.  The surgical site was marked.  Surgical consent had been signed. Preoperative IV antibiotic prophylaxis was given. He was transferred to the operating room and placed on the operating table in supine position with the left upper extremity on an arm board.  General anesthesia was induced by the anesthesiologist. A regional block had been performed by anesthesia in preoperative holding.    Left upper  extremity was prepped and draped in normal sterile orthopedic fashion.  A surgical pause was performed between the surgeons, anesthesia, and operating room staff and all were in agreement as to the patient, procedure, and site of procedure.  Tourniquet at the proximal aspect of the extremity was inflated to 250 mmHg after exsanguination of the arm with an Esmarch bandage.  Incision was made over the transverse carpal ligament.  This was carried in subcutaneous tissues by spreading technique.  Bipolar electrocautery was used to obtain hemostasis.  The flexor tendons were identified distal to the transverse carpal ligament.  The tendon to the ring finger was identified.  This was retracted radially.  The ligament was then incised sharply from distal to proximal under direct visualization.  The distal aspect of the volar antebrachial fascia was divided with scissors under direct visualization.  The nerve was inspected.  It was adherent to the radial leaflet.  The motor branch was identified and was intact.  The wound was copiously irrigated with sterile saline and closed with 4-0 nylon in a horizontal mattress fashion.  An incision was then made at the medial side of the elbow.  This was carried in subcutaneous tissues by spreading technique.  Bipolar electrocautery was used to obtain hemostasis.  The ulnar nerve was identified proximal to Osborne's ligament.  It was carefully protected throughout the case.  Osborne's ligament was incised under direct visualization from proximal to distal toward its dorsal aspect.  The muscular fascia over the FCU muscle was released and the muscle bellies of the FCU spread.  The investing fascia over the nerve  was released under direct visualization.  Motor branch to the FCU muscle was protected.  The nerve was then decompressed proximally again releasing muscular fascia and investing fascia over the nerve while protecting the nerve with a sled.  The elbow was flexed.  The nerve did  not subluxate.  The wound was copiously irrigated with sterile saline.  The anterior leaflet of Osborne's ligament was repaired to the posterior subcutaneous tissues to provide a bolster.  There was no compression created.  Inverted interrupted 4-0 Vicryl sutures were placed in subcutaneous tissues and skin was closed with 4-0 nylon in a horizontal mattress fashion.  The wounds were all dressed with sterile Xeroform 4 x 4's and ABD and wrapped with Kerlix and Ace bandage.  The tourniquet was deflated at 35 minutes.  Fingertips were pink with brisk capillary refill after deflation of tourniquet.  The operative  drapes were broken down.  The patient was awoken from anesthesia safely.  He was transferred back to the stretcher and taken to PACU in stable condition.  I will see him back in the office in 1 week for postoperative followup.  I will give him a prescription for Norco 5/325 1-2 tabs PO q6 hours prn pain, dispense # 15.   Betha Loa, MD Electronically signed, 08/24/22

## 2022-08-24 NOTE — Discharge Instructions (Addendum)
[facility] Instructions °Hand Surgery ° °Wound Care: °Keep your hand elevated above the level of your heart.  Do not allow it to dangle by your side.  Keep the dressing dry and do not remove it unless your doctor advises you to do so.  He will usually change it at the time of your post-op visit.  Moving your fingers is advised to stimulate circulation but will depend on the site of your surgery.  If you have a splint applied, your doctor will advise you regarding movement. ° °Activity: °Do not drive or operate machinery today.  Rest today and then you may return to your normal activity and work as indicated by your physician. ° °Diet:  °Drink liquids today or eat a light diet.  You may resume a regular diet tomorrow.   ° °General expectations: °Pain for two to three days. °Fingers may become slightly swollen. ° °Call your doctor if any of the following occur: °Severe pain not relieved by pain medication. °Elevated temperature. °Dressing soaked with blood. °Inability to move fingers. °White or bluish color to fingers. ° ° °Post Anesthesia Home Care Instructions ° °Activity: °Get plenty of rest for the remainder of the day. A responsible individual must stay with you for 24 hours following the procedure.  °For the next 24 hours, DO NOT: °-Drive a car °-Operate machinery °-Drink alcoholic beverages °-Take any medication unless instructed by your physician °-Make any legal decisions or sign important papers. ° °Meals: °Start with liquid foods such as gelatin or soup. Progress to regular foods as tolerated. Avoid greasy, spicy, heavy foods. If nausea and/or vomiting occur, drink only clear liquids until the nausea and/or vomiting subsides. Call your physician if vomiting continues. ° °Special Instructions/Symptoms: °Your throat may feel dry or sore from the anesthesia or the breathing tube placed in your throat during surgery. If this causes discomfort, gargle with warm salt water. The discomfort should disappear within  24 hours. ° °If you had a scopolamine patch placed behind your ear for the management of post- operative nausea and/or vomiting: ° °1. The medication in the patch is effective for 72 hours, after which it should be removed.  Wrap patch in a tissue and discard in the trash. Wash hands thoroughly with soap and water. °2. You may remove the patch earlier than 72 hours if you experience unpleasant side effects which may include dry mouth, dizziness or visual disturbances. °3. Avoid touching the patch. Wash your hands with soap and water after contact with the patch. °  ° °Post Anesthesia Home Care Instructions ° °Activity: °Get plenty of rest for the remainder of the day. A responsible individual must stay with you for 24 hours following the procedure.  °For the next 24 hours, DO NOT: °-Drive a car °-Operate machinery °-Drink alcoholic beverages °-Take any medication unless instructed by your physician °-Make any legal decisions or sign important papers. ° °Meals: °Start with liquid foods such as gelatin or soup. Progress to regular foods as tolerated. Avoid greasy, spicy, heavy foods. If nausea and/or vomiting occur, drink only clear liquids until the nausea and/or vomiting subsides. Call your physician if vomiting continues. ° °Special Instructions/Symptoms: °Your throat may feel dry or sore from the anesthesia or the breathing tube placed in your throat during surgery. If this causes discomfort, gargle with warm salt water. The discomfort should disappear within 24 hours. ° °If you had a scopolamine patch placed behind your ear for the management of post- operative nausea and/or vomiting: ° °  1. The medication in the patch is effective for 72 hours, after which it should be removed.  Wrap patch in a tissue and discard in the trash. Wash hands thoroughly with soap and water. °2. You may remove the patch earlier than 72 hours if you experience unpleasant side effects which may include dry mouth, dizziness or visual  disturbances. °3. Avoid touching the patch. Wash your hands with soap and water after contact with the patch. °  ° °

## 2022-08-24 NOTE — Anesthesia Preprocedure Evaluation (Addendum)
Anesthesia Evaluation  Patient identified by MRN, date of birth, ID band Patient awake    Reviewed: Allergy & Precautions, NPO status , Patient's Chart, lab work & pertinent test results  Airway Mallampati: I  TM Distance: >3 FB Neck ROM: Full    Dental  (+) Teeth Intact, Dental Advisory Given   Pulmonary sleep apnea    breath sounds clear to auscultation       Cardiovascular hypertension, + CAD, + Past MI and +CHF  + dysrhythmias Atrial Fibrillation  Rhythm:Regular Rate:Normal     Neuro/Psych   Anxiety     negative neurological ROS     GI/Hepatic Neg liver ROS,GERD  ,,  Endo/Other  diabetes    Renal/GU negative Renal ROS     Musculoskeletal negative musculoskeletal ROS (+)    Abdominal  (+) + obese  Peds  Hematology negative hematology ROS (+)   Anesthesia Other Findings   Reproductive/Obstetrics                             Anesthesia Physical Anesthesia Plan  ASA: 3  Anesthesia Plan: General   Post-op Pain Management: Regional block*   Induction: Intravenous  PONV Risk Score and Plan: 3 and Ondansetron, Dexamethasone and Midazolam  Airway Management Planned: LMA  Additional Equipment: None  Intra-op Plan:   Post-operative Plan: Extubation in OR  Informed Consent: I have reviewed the patients History and Physical, chart, labs and discussed the procedure including the risks, benefits and alternatives for the proposed anesthesia with the patient or authorized representative who has indicated his/her understanding and acceptance.     Dental advisory given  Plan Discussed with: CRNA  Anesthesia Plan Comments:        Anesthesia Quick Evaluation

## 2022-08-24 NOTE — Anesthesia Procedure Notes (Signed)
Anesthesia Regional Block: Supraclavicular block   Pre-Anesthetic Checklist: , timeout performed,  Correct Patient, Correct Site, Correct Laterality,  Correct Procedure, Correct Position, site marked,  Risks and benefits discussed,  Surgical consent,  Pre-op evaluation,  At surgeon's request and post-op pain management  Laterality: Left  Prep: chloraprep       Needles:  Injection technique: Single-shot  Needle Type: Echogenic Stimulator Needle     Needle Length: 9cm  Needle Gauge: 21     Additional Needles:   Procedures:,,,, ultrasound used (permanent image in chart),,    Narrative:  Start time: 08/24/2022 11:40 AM End time: 08/24/2022 11:45 AM Injection made incrementally with aspirations every 5 mL.  Performed by: Personally  Anesthesiologist: Effie Berkshire, MD  Additional Notes: Discussed risks and benefits of the nerve block in detail, including but not limited vascular injury, permanent nerve damage and infection.   Patient tolerated the procedure well. Local anesthetic introduced in an incremental fashion under minimal resistance after negative aspirations. No paresthesias were elicited. After completion of the procedure, no acute issues were identified and patient continued to be monitored by RN.

## 2022-08-24 NOTE — Transfer of Care (Signed)
Immediate Anesthesia Transfer of Care Note  Patient: [name] Merritt  Procedure(s) Performed: LEFT CARPAL TUNNEL RELEASE (Left: Wrist) LEFT ULNAR NERVE DECOMPRESSION (Left: Elbow)  Patient Location: PACU  Anesthesia Type:General  Level of Consciousness: awake, alert , and oriented  Airway & Oxygen Therapy: Patient Spontanous Breathing and Patient connected to face mask oxygen  Post-op Assessment: Report given to RN  Post vital signs: Reviewed and stable  Last Vitals:  Vitals Value Taken Time  BP    Temp    Pulse 50 08/24/22 1419  Resp 0 08/24/22 1419  SpO2 97 % 08/24/22 1419  Vitals shown include unvalidated device data.  Last Pain:  Vitals:   08/24/22 1035  TempSrc: Oral  PainSc: 0-No pain         Complications: No notable events documented.

## 2022-08-24 NOTE — Anesthesia Procedure Notes (Addendum)
Procedure Name: LMA Insertion Date/Time: 08/24/2022 1:03 PM  Performed by: Maryella Shivers, CRNAPre-anesthesia Checklist: Patient identified, Emergency Drugs available, Suction available and Patient being monitored Patient Re-evaluated:Patient Re-evaluated prior to induction Oxygen Delivery Method: Circle system utilized Preoxygenation: Pre-oxygenation with 100% oxygen Induction Type: IV induction Ventilation: Mask ventilation without difficulty LMA: LMA inserted LMA Size: 5.0 Number of attempts: 1 Airway Equipment and Method: Bite block Placement Confirmation: positive ETCO2 Tube secured with: Tape Dental Injury: Teeth and Oropharynx as per pre-operative assessment

## 2022-08-25 ENCOUNTER — Encounter (HOSPITAL_BASED_OUTPATIENT_CLINIC_OR_DEPARTMENT_OTHER): Payer: Self-pay | Admitting: Orthopedic Surgery

## 2022-08-25 ENCOUNTER — Other Ambulatory Visit (HOSPITAL_COMMUNITY): Payer: Self-pay

## 2022-08-25 NOTE — Anesthesia Postprocedure Evaluation (Signed)
Anesthesia Post Note  Patient: [name]  Procedure(s) Performed: LEFT CARPAL TUNNEL RELEASE (Left: Wrist) LEFT ULNAR NERVE DECOMPRESSION (Left: Elbow)     Patient location during evaluation: PACU Anesthesia Type: General Level of consciousness: awake and alert Pain management: pain level controlled Vital Signs Assessment: post-procedure vital signs reviewed and stable Respiratory status: spontaneous breathing, nonlabored ventilation, respiratory function stable and patient connected to nasal cannula oxygen Cardiovascular status: blood pressure returned to baseline and stable Postop Assessment: no apparent nausea or vomiting Anesthetic complications: no   No notable events documented.  Last Vitals:  Vitals:   08/24/22 1437 08/24/22 1442  BP:  (!) 110/93  Pulse: (!) 52 (!) 53  Resp: 18 14  Temp:  36.5 C  SpO2: 94% 94%    Last Pain:  Vitals:   08/24/22 1442  TempSrc: Oral  PainSc: 0-No pain                 Effie Berkshire

## 2022-08-29 DIAGNOSIS — G4733 Obstructive sleep apnea (adult) (pediatric): Secondary | ICD-10-CM | POA: Diagnosis not present

## 2022-08-30 DIAGNOSIS — E113512 Type 2 diabetes mellitus with proliferative diabetic retinopathy with macular edema, left eye: Secondary | ICD-10-CM | POA: Diagnosis not present

## 2022-09-15 ENCOUNTER — Other Ambulatory Visit (HOSPITAL_COMMUNITY): Payer: Self-pay

## 2022-09-19 DIAGNOSIS — E113511 Type 2 diabetes mellitus with proliferative diabetic retinopathy with macular edema, right eye: Secondary | ICD-10-CM | POA: Diagnosis not present

## 2022-09-26 ENCOUNTER — Other Ambulatory Visit (HOSPITAL_COMMUNITY): Payer: Self-pay

## 2022-10-03 ENCOUNTER — Other Ambulatory Visit: Payer: Self-pay

## 2022-10-04 DIAGNOSIS — E113512 Type 2 diabetes mellitus with proliferative diabetic retinopathy with macular edema, left eye: Secondary | ICD-10-CM | POA: Diagnosis not present

## 2022-10-09 ENCOUNTER — Other Ambulatory Visit (HOSPITAL_COMMUNITY): Payer: Self-pay

## 2022-10-10 ENCOUNTER — Other Ambulatory Visit (HOSPITAL_COMMUNITY): Payer: Self-pay

## 2022-10-10 MED ORDER — PANTOPRAZOLE SODIUM 40 MG PO TBEC
40.0000 mg | DELAYED_RELEASE_TABLET | Freq: Two times a day (BID) | ORAL | 3 refills | Status: DC
  Filled 2022-10-10: qty 180, 90d supply, fill #0
  Filled 2023-01-08 (×2): qty 180, 90d supply, fill #1
  Filled 2023-04-23: qty 180, 90d supply, fill #2
  Filled 2023-07-30: qty 180, 90d supply, fill #3

## 2022-10-30 ENCOUNTER — Other Ambulatory Visit (HOSPITAL_COMMUNITY): Payer: Self-pay

## 2022-11-07 DIAGNOSIS — E113511 Type 2 diabetes mellitus with proliferative diabetic retinopathy with macular edema, right eye: Secondary | ICD-10-CM | POA: Diagnosis not present

## 2022-11-08 ENCOUNTER — Other Ambulatory Visit (HOSPITAL_COMMUNITY): Payer: Self-pay

## 2022-11-08 MED ORDER — SILDENAFIL CITRATE 20 MG PO TABS
20.0000 mg | ORAL_TABLET | Freq: Every day | ORAL | 1 refills | Status: AC | PRN
  Filled 2022-11-08: qty 100, 40d supply, fill #0

## 2022-11-09 ENCOUNTER — Other Ambulatory Visit (HOSPITAL_COMMUNITY): Payer: Self-pay

## 2022-11-17 DIAGNOSIS — E113512 Type 2 diabetes mellitus with proliferative diabetic retinopathy with macular edema, left eye: Secondary | ICD-10-CM | POA: Diagnosis not present

## 2022-11-23 ENCOUNTER — Other Ambulatory Visit (HOSPITAL_COMMUNITY): Payer: Self-pay

## 2022-11-27 ENCOUNTER — Other Ambulatory Visit: Payer: Self-pay | Admitting: Cardiovascular Disease

## 2022-11-27 ENCOUNTER — Other Ambulatory Visit (HOSPITAL_COMMUNITY): Payer: Self-pay

## 2022-11-27 DIAGNOSIS — I4892 Unspecified atrial flutter: Secondary | ICD-10-CM

## 2022-11-27 MED ORDER — SPIRONOLACTONE 25 MG PO TABS
12.5000 mg | ORAL_TABLET | Freq: Two times a day (BID) | ORAL | 11 refills | Status: DC
  Filled 2022-11-27: qty 30, 30d supply, fill #0
  Filled 2023-01-06 – 2023-01-08 (×2): qty 30, 30d supply, fill #1
  Filled 2023-02-03 – 2023-02-05 (×2): qty 30, 30d supply, fill #2
  Filled 2023-03-07: qty 90, 90d supply, fill #3
  Filled 2023-06-11: qty 90, 90d supply, fill #4
  Filled 2023-08-20 – 2023-08-23 (×2): qty 90, 90d supply, fill #5

## 2022-11-27 MED ORDER — APIXABAN 5 MG PO TABS
5.0000 mg | ORAL_TABLET | Freq: Two times a day (BID) | ORAL | 1 refills | Status: DC
  Filled 2022-11-27: qty 180, 90d supply, fill #0
  Filled 2023-03-07: qty 180, 90d supply, fill #1

## 2022-11-27 MED ORDER — DAPAGLIFLOZIN PROPANEDIOL 10 MG PO TABS
10.0000 mg | ORAL_TABLET | Freq: Every day | ORAL | 11 refills | Status: DC
  Filled 2022-11-27: qty 30, 30d supply, fill #0
  Filled 2023-01-06 – 2023-01-08 (×2): qty 30, 30d supply, fill #1
  Filled 2023-02-03 – 2023-02-05 (×2): qty 30, 30d supply, fill #2
  Filled 2023-03-07: qty 90, 90d supply, fill #3
  Filled 2023-06-04: qty 30, 30d supply, fill #4
  Filled 2023-07-09: qty 30, 30d supply, fill #5
  Filled 2023-08-06: qty 30, 30d supply, fill #6
  Filled 2023-09-10: qty 30, 30d supply, fill #7
  Filled 2023-10-15: qty 30, 30d supply, fill #8
  Filled 2023-11-12: qty 30, 30d supply, fill #9

## 2022-11-27 NOTE — Telephone Encounter (Signed)
Prescription refill request for Eliquis received. Indication:aflutter Last office visit:9/23 Scr:1.0  9/23 Age: 52 Weight:139.9  kg  Prescription refilled

## 2022-11-28 DIAGNOSIS — G4733 Obstructive sleep apnea (adult) (pediatric): Secondary | ICD-10-CM | POA: Diagnosis not present

## 2022-12-05 ENCOUNTER — Ambulatory Visit: Payer: 59 | Attending: Cardiovascular Disease | Admitting: Cardiovascular Disease

## 2022-12-11 ENCOUNTER — Other Ambulatory Visit (HOSPITAL_COMMUNITY): Payer: Self-pay

## 2022-12-12 ENCOUNTER — Other Ambulatory Visit (HOSPITAL_COMMUNITY): Payer: Self-pay

## 2022-12-13 DIAGNOSIS — E113511 Type 2 diabetes mellitus with proliferative diabetic retinopathy with macular edema, right eye: Secondary | ICD-10-CM | POA: Diagnosis not present

## 2022-12-27 DIAGNOSIS — G4733 Obstructive sleep apnea (adult) (pediatric): Secondary | ICD-10-CM | POA: Diagnosis not present

## 2023-01-01 ENCOUNTER — Other Ambulatory Visit: Payer: Self-pay | Admitting: Cardiovascular Disease

## 2023-01-01 ENCOUNTER — Other Ambulatory Visit (HOSPITAL_COMMUNITY): Payer: Self-pay

## 2023-01-01 MED ORDER — METOPROLOL SUCCINATE ER 25 MG PO TB24
50.0000 mg | ORAL_TABLET | Freq: Every day | ORAL | 3 refills | Status: DC
  Filled 2023-01-01: qty 180, 90d supply, fill #0
  Filled 2023-03-07 – 2023-04-09 (×2): qty 180, 90d supply, fill #1
  Filled 2023-07-09: qty 180, 90d supply, fill #2
  Filled 2023-11-12: qty 180, 90d supply, fill #3

## 2023-01-02 ENCOUNTER — Other Ambulatory Visit (HOSPITAL_COMMUNITY): Payer: Self-pay

## 2023-01-02 MED ORDER — AMLODIPINE BESYLATE 5 MG PO TABS
5.0000 mg | ORAL_TABLET | Freq: Every day | ORAL | 0 refills | Status: DC
  Filled 2023-01-02: qty 30, 30d supply, fill #0

## 2023-01-05 DIAGNOSIS — E113512 Type 2 diabetes mellitus with proliferative diabetic retinopathy with macular edema, left eye: Secondary | ICD-10-CM | POA: Diagnosis not present

## 2023-01-08 ENCOUNTER — Other Ambulatory Visit: Payer: Self-pay

## 2023-01-08 ENCOUNTER — Other Ambulatory Visit: Payer: Self-pay | Admitting: Cardiovascular Disease

## 2023-01-08 ENCOUNTER — Other Ambulatory Visit (HOSPITAL_COMMUNITY): Payer: Self-pay

## 2023-01-08 MED ORDER — TRAZODONE HCL 100 MG PO TABS
100.0000 mg | ORAL_TABLET | Freq: Every day | ORAL | 3 refills | Status: AC
  Filled 2023-01-08 (×2): qty 90, 90d supply, fill #0
  Filled 2023-08-23: qty 90, 90d supply, fill #1

## 2023-01-11 ENCOUNTER — Other Ambulatory Visit: Payer: Self-pay

## 2023-01-12 ENCOUNTER — Other Ambulatory Visit (HOSPITAL_COMMUNITY): Payer: Self-pay

## 2023-01-15 ENCOUNTER — Other Ambulatory Visit (HOSPITAL_COMMUNITY): Payer: Self-pay

## 2023-01-26 DIAGNOSIS — E113511 Type 2 diabetes mellitus with proliferative diabetic retinopathy with macular edema, right eye: Secondary | ICD-10-CM | POA: Diagnosis not present

## 2023-01-27 DIAGNOSIS — G4733 Obstructive sleep apnea (adult) (pediatric): Secondary | ICD-10-CM | POA: Diagnosis not present

## 2023-02-01 ENCOUNTER — Telehealth: Payer: Self-pay | Admitting: Cardiovascular Disease

## 2023-02-01 ENCOUNTER — Other Ambulatory Visit (HOSPITAL_COMMUNITY): Payer: Self-pay

## 2023-02-01 NOTE — Telephone Encounter (Signed)
*  STAT* If patient is at the pharmacy, call can be transferred to refill team.   1. Which medications need to be refilled? (please list name of each medication and dose if known)   amLODipine (NORVASC) 5 MG tablet    2. Which pharmacy/location (including street and city if local pharmacy) is medication to be sent to?  [place] [facility]    3. Do they need a 30 day or 90 day supply?  30 day

## 2023-02-02 ENCOUNTER — Other Ambulatory Visit (HOSPITAL_COMMUNITY): Payer: Self-pay

## 2023-02-02 MED ORDER — AMLODIPINE BESYLATE 5 MG PO TABS
5.0000 mg | ORAL_TABLET | Freq: Every day | ORAL | 5 refills | Status: DC
  Filled 2023-02-02: qty 30, 30d supply, fill #0
  Filled 2023-03-07: qty 90, 90d supply, fill #1
  Filled 2023-06-04: qty 30, 30d supply, fill #2
  Filled 2023-07-09: qty 30, 30d supply, fill #3

## 2023-02-02 NOTE — Telephone Encounter (Signed)
Pt's medication was sent to pt's pharmacy as requested. Confirmation received.  °

## 2023-02-05 ENCOUNTER — Other Ambulatory Visit (HOSPITAL_COMMUNITY): Payer: Self-pay

## 2023-02-14 NOTE — Progress Notes (Signed)
Cardiology Clinic Note   Patient Name: [name] Merritt Date of Encounter: 02/16/2023  Primary Care Provider:  Renford Dills, MD Primary Cardiologist:  Nicki Guadalajara, MD  Patient Profile    [name] Merritt 52 year old male presents to the clinic today for follow-up evaluation of his ischemic cardiomyopathy, atrial flutter, and coronary artery disease.  Past Medical History    Past Medical History:  Diagnosis Date   Anxiety    CHF (congestive heart failure) (HCC)    Coronary artery disease    Diabetes mellitus without complication (HCC)    type II   GERD (gastroesophageal reflux disease)    not currently   High cholesterol    Hypertension    Myocardial infarction (HCC) 05/30/2020   New onset atrial flutter (HCC) 06/09/2020   Overweight    Sleep apnea    Past Surgical History:  Procedure Laterality Date   CARDIOVERSION N/A 06/10/2020   Procedure: CARDIOVERSION;  Surgeon: Little Ishikawa, MD;  Location: [facility] ENDOSCOPY;  Service: Cardiovascular;  Laterality: N/A;   CARPAL TUNNEL RELEASE Right 07/11/2022   Procedure: RIGHT CARPAL TUNNEL RELEASE;  Surgeon: Betha Loa, MD;  Location: [facility];  Service: Orthopedics;  Laterality: Right;  AXILLARY BLOCK   CARPAL TUNNEL RELEASE Left 08/24/2022   Procedure: LEFT CARPAL TUNNEL RELEASE;  Surgeon: Betha Loa, MD;  Location: [facility];  Service: Orthopedics;  Laterality: Left;  60 MIN   CORONARY STENT INTERVENTION N/A 05/30/2020   Procedure: CORONARY STENT INTERVENTION;  Surgeon: Lennette Bihari, MD;  Location: [facility] INVASIVE CV LAB;  Service: Cardiovascular;  Laterality: N/A;   CORONARY/GRAFT ACUTE MI REVASCULARIZATION N/A 05/30/2020   Procedure: Coronary/Graft Acute MI Revascularization;  Surgeon: Lennette Bihari, MD;  Location: [facility] INVASIVE CV LAB;  Service: Cardiovascular;  Laterality: N/A;   GAS INSERTION Right 07/19/2021   Procedure: INSERTION OF GAS - C3F8;  Surgeon: Carmela Rima, MD;   Location: [facility] OR;  Service: Ophthalmology;  Laterality: Right;   GAS/FLUID EXCHANGE Right 07/19/2021   Procedure: GAS/FLUID EXCHANGE;  Surgeon: Carmela Rima, MD;  Location: [facility] OR;  Service: Ophthalmology;  Laterality: Right;   LEFT HEART CATH AND CORONARY ANGIOGRAPHY N/A 05/30/2020   Procedure: LEFT HEART CATH AND CORONARY ANGIOGRAPHY;  Surgeon: Lennette Bihari, MD;  Location: [facility] INVASIVE CV LAB;  Service: Cardiovascular;  Laterality: N/A;   MEMBRANE PEEL Right 07/19/2021   Procedure: MEMBRANE PEEL;  Surgeon: Carmela Rima, MD;  Location: [facility] OR;  Service: Ophthalmology;  Laterality: Right;   PARS PLANA VITRECTOMY 27 GAUGE Right 09/27/2021   Procedure: PARS PLANA VITRECTOMY 27 GAUGE;  Surgeon: Carmela Rima, MD;  Location: [facility] OR;  Service: Ophthalmology;  Laterality: Right;   PHOTOCOAGULATION WITH LASER Right 07/19/2021   Procedure: PHOTOCOAGULATION WITH LASER;  Surgeon: Carmela Rima, MD;  Location: [facility] OR;  Service: Ophthalmology;  Laterality: Right;   PHOTOCOAGULATION WITH LASER Right 09/27/2021   Procedure: PHOTOCOAGULATION WITH LASER;  Surgeon: Carmela Rima, MD;  Location: [facility] OR;  Service: Ophthalmology;  Laterality: Right;   REPAIR OF COMPLEX TRACTION RETINAL DETACHMENT Right 07/19/2021   Procedure: REPAIR OF COMPLEX TRACTION RETINAL DETACHMENT;  Surgeon: Carmela Rima, MD;  Location: [facility] OR;  Service: Ophthalmology;  Laterality: Right;   TEE WITHOUT CARDIOVERSION N/A 06/10/2020   Procedure: TRANSESOPHAGEAL ECHOCARDIOGRAM (TEE);  Surgeon: Little Ishikawa, MD;  Location: [facility] ENDOSCOPY;  Service: Cardiovascular;  Laterality: N/A;   torn rotator cuff  2018   2018   ULNAR NERVE TRANSPOSITION Right 07/11/2022   Procedure: RIGHT  ULNAR NERVE DECOMPRESSION CUBITAL TUNNEL WITH POSSIBLE TRANSPOSITION;  Surgeon: Betha Loa, MD;  Location: [facility];  Service: Orthopedics;  Laterality: Right;  AXILLARY BLOCK   ULNAR NERVE TRANSPOSITION Left 08/24/2022   Procedure: LEFT ULNAR  NERVE DECOMPRESSION;  Surgeon: Betha Loa, MD;  Location: [facility];  Service: Orthopedics;  Laterality: Left;    Allergies  Allergies  Allergen Reactions   Pineapple Itching and Other (See Comments)    All-over itching (mouth, too)   Tomato Itching and Other (See Comments)    All-over itching (mouth, too) Ketchup, also   Rosuvastatin Calcium Hives and Rash    History of Present Illness    [name] is a PMH of HTN, HLD, morbid obesity, type 2 diabetes, coronary artery disease status post STEMI 8/21 with PCI and DES to his mid LAD.  His echocardiogram 01/30/2020 showed an EF of 35-40%, akinesis of his mid septum extending to the apex consistent with his infarct, G2 DD.  He developed atrial fibrillation and underwent TEE DCCV by Dr. Bjorn Pippin 06/10/2020.  After his procedure he was placed on Plavix and apixaban.  He followed up with Azalee Course PA-C on 9/21.  During that time he was stable from a cardiac standpoint.  His Entresto was increased to 4951 twice daily.  He underwent sleep study which showed moderate sleep apnea.  He was instructed to follow-up with pulmonology for CPAP titration.  He was seen by Dr. Tresa Endo on 01/28/2021.  He denied recurrent chest pain.  He was compliant with his medications.  His Entresto was further titrated to 97/103 and spironolactone was added to his medication regimen.  He was again in follow-up by Dr. Tresa Endo 11/17/2021.  His blood pressure was noted to be elevated.  His echocardiogram 03/08/2021 showed an LV function of 55-60% with no wall motion abnormalities and mild aortic sclerosis without stenosis.  He underwent split-night sleep study evaluation 03/28/2021.  He was confirmed to have severe sleep apnea.  CPAP use was encouraged.  He was seen by Dr. Tresa Endo on 05/11/2022.  During that time he reported that he had undergone colonoscopy and was noted to have 2 small polyps.  He continued CPAP use.  He also reported several eye surgeries.  His  CPAP mask was irritating his eyes.  It was recommended that he switch to nasal mask.  He also noted hand weakness and numbness in his third and fourth fingers.  This was felt to be related to ulnar nerve damage.  His medications were continued.  Follow-up was planned for 6 months.  He presents to the clinic today for follow-up evaluation and states he is working with his PCP to lower his cholesterol.  He does occasionally note chest discomfort.  He reports that it occasionally happens at rest and dissipates on its own without intervention.  He also notes chest discomfort with cutting his grass.  He reports he is able to continue walking and his pain dissipates.  We will renew his sublingual nitroglycerin.  I encouraged continued lowering of his cholesterol.  He and his wife expressed understanding.  I have asked them to resume atorvastatin 3 days/week and continue Repatha.  We reviewed his CPAP use.  He is not been compliant with CPAP due to eye injections.  We reviewed the benefit of CPAP.  He expressed understanding.  I will order fasting lipids and LFTs for 2 months, refills nitroglycerin and plan for follow-up in 9 to 12 months.  Today he denies  shortness of breath, lower extremity edema, fatigue, palpitations, melena, hematuria, hemoptysis, diaphoresis, weakness, presyncope, syncope, orthopnea, and PND.    Home Medications    Prior to Admission medications   Medication Sig Start Date End Date Taking? Authorizing Provider  acetaminophen (TYLENOL) 650 MG CR tablet Take 1,300 mg by mouth every 8 (eight) hours as needed for pain.    [provider]  albuterol (VENTOLIN HFA) 108 (90 Base) MCG/ACT inhaler Inhale 2 puffs into the lungs every 6 (six) hours as needed for wheezing or shortness of breath.    [provider]  amLODipine (NORVASC) 5 MG tablet Take 1 tablet (5 mg total) by mouth daily. 02/02/23   Swinyer, Zachary George, NP  apixaban (ELIQUIS) 5 MG TABS tablet Take 1 tablet (5 mg  total) by mouth 2 (two) times daily. 11/27/22   Lennette Bihari, MD  Ascorbic Acid (VITAMIN C) 1000 MG tablet Take 1,000 mg by mouth daily.    [provider]  atorvastatin (LIPITOR) 80 MG tablet Take 1 tablet (80 mg total) by mouth daily at 6 PM. 07/01/20   Azalee Course, PA  cetirizine (ZYRTEC) 10 MG tablet TAKE 1 TABLET(10 MG) BY MOUTH DAILY 08/16/20   Charlott Holler, MD  CINNAMON PO Take 1,500 mg by mouth 2 (two) times daily.    [provider]  clopidogrel (PLAVIX) 75 MG tablet Take 1 tablet (75 mg total) by mouth daily. 05/29/22   Georgie Chard D, NP  dapagliflozin propanediol (FARXIGA) 10 MG TABS tablet Take 1 tablet (10 mg total) by mouth daily. 11/27/22   Swinyer, Zachary George, NP  Evolocumab (REPATHA SURECLICK) 140 MG/ML SOAJ Inject 1 Dose into the skin every 14 (fourteen) days. 03/31/22   Hilty, Lisette Abu, MD  HYDROcodone-acetaminophen (NORCO) 5-325 MG tablet Take 1-2 tablets by mouth every 6 (six) hours as needed forpain 08/24/22   Betha Loa, MD  metFORMIN (GLUCOPHAGE) 1000 MG tablet Take 1 tablet (1,000 mg total) by mouth in the morning AND 0.5 tablets (500 mg total) every evening with meals 06/21/22     metoprolol succinate (TOPROL-XL) 25 MG 24 hr tablet Take 2 tablets (50 mg total) by mouth daily. 01/01/23     nitroGLYCERIN (NITROSTAT) 0.4 MG SL tablet Place 1 tablet (0.4 mg total) under the tongue every 5 (five) minutes as needed for chest pain. 12/28/21   Lennette Bihari, MD  pantoprazole (PROTONIX) 40 MG tablet Take 1 tablet (40 mg total) by mouth 2 (two) times daily. 10/10/22     polyethylene glycol-electrolytes (NULYTELY) 420 g solution Use as directed 11/30/21     sacubitril-valsartan (ENTRESTO) 97-103 MG Take 1 tablet by mouth 2 (two) times daily. 06/19/22   Lennette Bihari, MD  Semaglutide,0.25 or 0.5MG /DOS, (OZEMPIC, 0.25 OR 0.5 MG/DOSE,) 2 MG/3ML SOPN Inject 0.25 mg into the skin once a week. 07/28/22     sildenafil (REVATIO) 20 MG tablet Take 20 mg by mouth daily as needed for  erectile dysfunction.    [provider]  sildenafil (REVATIO) 20 MG tablet Take 1-5 tablets (20-100 mg total) by mouth daily as needed. 11/08/22     spironolactone (ALDACTONE) 25 MG tablet Take 1/2 tablet (12.5 mg total) by mouth 2 (two) times daily. 11/27/22   Swinyer, Zachary George, NP  traZODone (DESYREL) 100 MG tablet Take 1 tablet (100 mg total) by mouth at bedtime. 01/08/23     zinc gluconate 50 MG tablet Take 50 mg by mouth daily.    [provider]  Family History    Family History  Problem Relation Age of Onset   Hypertension Mother    Diabetes Mother    Hypertension Father    Diabetes Father    Diabetes Maternal Grandmother    Hypertension Maternal Grandmother    Diabetes Maternal Grandfather    Hypertension Maternal Grandfather    Diabetes Paternal Grandmother    Hypertension Paternal Grandmother    Diabetes Paternal Grandfather    Hypertension Paternal Grandfather    Asthma Neg Hx    He indicated that his mother is alive. He indicated that his father is alive. He indicated that his maternal grandmother is deceased. He indicated that his maternal grandfather is deceased. He indicated that his paternal grandmother is deceased. He indicated that his paternal grandfather is deceased. He indicated that the status of his neg hx is unknown.  Social History    Social History   Socioeconomic History   Marital status: Married    Spouse name: Not on file   Number of children: Not on file   Years of education: Not on file   Highest education level: Not on file  Occupational History   Occupation: maintenance  Tobacco Use   Smoking status: Never   Smokeless tobacco: Former    Types: Chew    Quit date: 2017  Vaping Use   Vaping Use: Never used  Substance and Sexual Activity   Alcohol use: Not Currently   Drug use: Not Currently    Types: Marijuana    Comment: 2006- last time   Sexual activity: Not on file  Other Topics Concern   Not on file  Social  History Narrative   Not on file   Social Determinants of Health   Financial Resource Strain: Not on file  Food Insecurity: Not on file  Transportation Needs: Not on file  Physical Activity: Not on file  Stress: Not on file  Social Connections: Not on file  Intimate Partner Violence: Not on file     Review of Systems    General:  No chills, fever, night sweats or weight changes.  Cardiovascular:  No chest pain, dyspnea on exertion, edema, orthopnea, palpitations, paroxysmal nocturnal dyspnea. Dermatological: No rash, lesions/masses Respiratory: No cough, dyspnea Urologic: No hematuria, dysuria Abdominal:   No nausea, vomiting, diarrhea, bright red blood per rectum, melena, or hematemesis Neurologic:  No visual changes, wkns, changes in mental status. All other systems reviewed and are otherwise negative except as noted above.  Physical Exam    VS:  BP 132/78   Pulse 60   Ht 6\' 1"  (1.854 m)   Wt 299 lb 9.6 oz (135.9 kg)   SpO2 96%   BMI 39.53 kg/m  , BMI Body mass index is 39.53 kg/m. GEN: Well nourished, well developed, in no acute distress. HEENT: normal. Neck: Supple, no JVD, carotid bruits, or masses. Cardiac: RRR, no murmurs, rubs, or gallops. No clubbing, cyanosis, edema.  Radials/DP/PT 2+ and equal bilaterally.  Respiratory:  Respirations regular and unlabored, clear to auscultation bilaterally. GI: Soft, nontender, nondistended, BS + x 4. MS: no deformity or atrophy. Skin: warm and dry, no rash. Neuro:  Strength and sensation are intact. Psych: Normal affect.  Accessory Clinical Findings    Recent Labs: 08/21/2022: BUN 18; Creatinine, Ser 1.10; Potassium 5.0; Sodium 139   Recent Lipid Panel    Component Value Date/Time   CHOL 179 01/28/2021 1000   TRIG 52 01/28/2021 1000   HDL 45 01/28/2021 1000   CHOLHDL 4.0  01/28/2021 1000   CHOLHDL 5.1 05/30/2020 0952   VLDL 15 05/30/2020 0952   LDLCALC 124 (H) 01/28/2021 1000         ECG personally reviewed  by me today-normal sinus rhythm low voltage QRS inferior infarct undetermined age 28 bpm- No acute changes  Echocardiogram 03/08/2021  IMPRESSIONS     1. Apical hypokinesis with overall preserved LV systolic function.   2. Left ventricular ejection fraction, by estimation, is 55 to 60%. The  left ventricle has normal function. The left ventricle has no regional  wall motion abnormalities. There is mild left ventricular hypertrophy.  Left ventricular diastolic parameters  were normal.   3. Right ventricular systolic function is normal. The right ventricular  size is normal. There is normal pulmonary artery systolic pressure.   4. The mitral valve is normal in structure. No evidence of mitral valve  regurgitation. No evidence of mitral stenosis.   5. The aortic valve is tricuspid. Aortic valve regurgitation is not  visualized. Mild aortic valve sclerosis is present, with no evidence of  aortic valve stenosis.   6. The inferior vena cava is normal in size with greater than 50%  respiratory variability, suggesting right atrial pressure of 3 mmHg.   FINDINGS   Left Ventricle: Left ventricular ejection fraction, by estimation, is 55  to 60%. The left ventricle has normal function. The left ventricle has no  regional wall motion abnormalities. Definity contrast agent was given IV  to delineate the left ventricular   endocardial borders. The left ventricular internal cavity size was normal  in size. There is mild left ventricular hypertrophy. Left ventricular  diastolic parameters were normal.   Right Ventricle: The right ventricular size is normal.Right ventricular  systolic function is normal. There is normal pulmonary artery systolic  pressure. The tricuspid regurgitant velocity is 2.56 m/s, and with an  assumed right atrial pressure of 3 mmHg,  the estimated right ventricular systolic pressure is 29.2 mmHg.   Left Atrium: Left atrial size was normal in size.   Right Atrium: Right  atrial size was normal in size.   Pericardium: There is no evidence of pericardial effusion.   Mitral Valve: The mitral valve is normal in structure. Mild mitral annular  calcification. No evidence of mitral valve regurgitation. No evidence of  mitral valve stenosis.   Tricuspid Valve: The tricuspid valve is normal in structure. Tricuspid  valve regurgitation is trivial. No evidence of tricuspid stenosis.   Aortic Valve: The aortic valve is tricuspid. Aortic valve regurgitation is  not visualized. Mild aortic valve sclerosis is present, with no evidence  of aortic valve stenosis.   Pulmonic Valve: The pulmonic valve was normal in structure. Pulmonic valve  regurgitation is not visualized. No evidence of pulmonic stenosis.   Aorta: The aortic root is normal in size and structure.   Venous: The inferior vena cava is normal in size with greater than 50%  respiratory variability, suggesting right atrial pressure of 3 mmHg.   IAS/Shunts: No atrial level shunt detected by color flow Doppler.   Additional Comments: Apical hypokinesis with overall preserved LV systolic  function.   Cardiac catheterization 05/30/2020  3rd Mrg lesion is 50% stenosed. LPDA lesion is 70% stenosed. LPAV-1 lesion is 30% stenosed. LPAV-2 lesion is 30% stenosed. 1st Diag lesion is 70% stenosed. Ramus lesion is 20% stenosed. Mid LAD lesion is 100% stenosed. Post intervention, there is a 0% residual stenosis. A stent was successfully placed.   Acute anterolateral ST segment elevation  myocardial infarction secondary to total occlusion of the mid LAD with TIMI 0 flow and absence of collaterals.   Concomitant CAD with tortuous 1st diagonal vessel with 70% narrowing beyond a sharp bend in the vessel, 20% narrowing beyond a sharp bend in the ramus immediate vessel, and dominant left circumflex coronary artery with 50% focal OM 3 stenosis, distal 30% stenoses in the AV groove, and focal 70% stenosis in the midportion  of a small PLA branch.  Normal small nondominant RCA.   LVEDP 12 millimeters Hg.    Successful PCI to the totally occluded mid LAD with ultimate insertion of a Resolute Onyx 3.0 x 22 mm DES stent postdilated to 3.25 mm with digital narrowing of 0% and restoration of TIMI-3 flow.   RECOMMENDATION: DAPT for minimum of 1 year.  Aggressive lipid-lowering therapy with titration of atorvastatin to 80 mg daily.  We will reinitiate ARB therapy with losartan initially at lower dose, add low-dose beta-blocker therapy, and obtain 2D echo Doppler study in a.m. optimal blood pressure control with target blood pressure less than 130/80 and ideal blood pressure less than 120/80.  We will diabetes control.  If patient has LV dysfunction, SGLT 2 inhibition can be considered.   Diagnostic Dominance: Left  Intervention      Assessment & Plan   1.  Coronary artery disease-no chest pain today.  Status post cardiac catheterization with PCI and DES to his LAD 8/21. Continue amlodipine, atorvastatin, Plavix, metoprolol Heart healthy low-sodium diet-salty 6 given Increase physical activity as tolerated  Hyperlipidemia-LDL 148 on 07/28/2022.  Follows with PCP.  Was instructed to restart atorvastatin today.  Reports myalgias with atorvastatin.  I instructed him to take it 3 times a week to trial Repeat fasting lipids and LFTs in 2 months Continue current medical therapy Heart healthy low-sodium high-fiber diet  OSA-not compliance with CPAP.  Used 3 out of 30 days for a total of 17 minutes.  Reports difficulty with facemasks due to eye injections. Encourage sleep abuse Avoid supine sleeping  Essential hypertension-BP today 132/78. Continue amlodipine, metoprolol, spironolactone Maintain blood pressure log  Morbid obesity-weight today 299.6. Continue weight loss Reduce calorie diet  Type 2 diabetes-glucose Continue metformin Follows with PCP  Disposition: Follow-up with Dr. Tresa Endo or me in 9-12  months.   Thomasene Ripple. Jeremie Abdelaziz NP-C     02/16/2023, 3:03 PM [facility] [address] Office [phone] Fax [phone]    I spent14 minutes examining this patient, reviewing medications, and using patient centered shared decision making involving her cardiac care.  Prior to her visit I spent greater than 20 minutes reviewing her past medical history,  medications, and prior cardiac tests.

## 2023-02-15 ENCOUNTER — Other Ambulatory Visit (HOSPITAL_COMMUNITY): Payer: Self-pay

## 2023-02-16 ENCOUNTER — Other Ambulatory Visit (HOSPITAL_COMMUNITY): Payer: Self-pay

## 2023-02-16 ENCOUNTER — Encounter: Payer: Self-pay | Admitting: General Practice

## 2023-02-16 ENCOUNTER — Ambulatory Visit: Payer: 59 | Attending: General Practice | Admitting: General Practice

## 2023-02-16 VITALS — BP 132/78 | HR 60 | Ht 73.0 in | Wt 299.6 lb

## 2023-02-16 DIAGNOSIS — E7800 Pure hypercholesterolemia, unspecified: Secondary | ICD-10-CM | POA: Diagnosis not present

## 2023-02-16 DIAGNOSIS — E113512 Type 2 diabetes mellitus with proliferative diabetic retinopathy with macular edema, left eye: Secondary | ICD-10-CM | POA: Diagnosis not present

## 2023-02-16 DIAGNOSIS — R5383 Other fatigue: Secondary | ICD-10-CM | POA: Diagnosis not present

## 2023-02-16 DIAGNOSIS — I10 Essential (primary) hypertension: Secondary | ICD-10-CM | POA: Diagnosis not present

## 2023-02-16 DIAGNOSIS — G4733 Obstructive sleep apnea (adult) (pediatric): Secondary | ICD-10-CM | POA: Diagnosis not present

## 2023-02-16 DIAGNOSIS — N529 Male erectile dysfunction, unspecified: Secondary | ICD-10-CM | POA: Diagnosis not present

## 2023-02-16 DIAGNOSIS — E118 Type 2 diabetes mellitus with unspecified complications: Secondary | ICD-10-CM | POA: Diagnosis not present

## 2023-02-16 DIAGNOSIS — I2510 Atherosclerotic heart disease of native coronary artery without angina pectoris: Secondary | ICD-10-CM | POA: Diagnosis not present

## 2023-02-16 DIAGNOSIS — E785 Hyperlipidemia, unspecified: Secondary | ICD-10-CM | POA: Diagnosis not present

## 2023-02-16 DIAGNOSIS — E113593 Type 2 diabetes mellitus with proliferative diabetic retinopathy without macular edema, bilateral: Secondary | ICD-10-CM | POA: Diagnosis not present

## 2023-02-16 DIAGNOSIS — I255 Ischemic cardiomyopathy: Secondary | ICD-10-CM | POA: Diagnosis not present

## 2023-02-16 MED ORDER — OZEMPIC (0.25 OR 0.5 MG/DOSE) 2 MG/3ML ~~LOC~~ SOPN
0.5000 mg | PEN_INJECTOR | SUBCUTANEOUS | 5 refills | Status: DC
  Filled 2023-03-26: qty 3, 28d supply, fill #0
  Filled 2023-04-23: qty 3, 28d supply, fill #1
  Filled 2023-05-18: qty 3, 28d supply, fill #2
  Filled 2023-06-15: qty 3, 28d supply, fill #3
  Filled 2023-07-13: qty 3, 28d supply, fill #4
  Filled 2023-08-06: qty 3, 28d supply, fill #5

## 2023-02-16 MED ORDER — NITROGLYCERIN 0.4 MG SL SUBL
0.4000 mg | SUBLINGUAL_TABLET | SUBLINGUAL | 5 refills | Status: DC | PRN
  Filled 2023-02-16: qty 25, 5d supply, fill #0

## 2023-02-16 MED ORDER — NITROGLYCERIN 0.4 MG SL SUBL
0.4000 mg | SUBLINGUAL_TABLET | SUBLINGUAL | 3 refills | Status: AC | PRN
  Filled 2023-02-16: qty 25, 5d supply, fill #0

## 2023-02-16 MED ORDER — ATORVASTATIN CALCIUM 80 MG PO TABS
80.0000 mg | ORAL_TABLET | ORAL | 3 refills | Status: AC
  Filled 2023-02-16: qty 36, 84d supply, fill #0

## 2023-02-16 NOTE — Patient Instructions (Addendum)
Medication Instructions:  TAKE YOUR ATORVASTATIN 3 TIMES WEEKLY *If you need a refill on your cardiac medications before your next appointment, please call your pharmacy*  Lab Work: FASTING LIPID AND LFT IN 2 MONTHS If you have labs (blood work) drawn today and your tests are completely normal, you will receive your results only by:  MyChart Message (if you have MyChart) OR  A paper copy in the mail If you have any lab test that is abnormal or we need to change your treatment, we will call you to review the results.  Other Instructions MAKE SURE YOUR USE YOUR CPAP MACHINE DAILY.  MAINTAIN YOUR PHYSICAL ACTIVITY  PLEASE READ AND FOLLOW ATTACHED  SALTY 6    Follow-Up: At [facility], you and your health needs are our priority.  As part of our continuing mission to provide you with exceptional heart care, we have created designated Provider Care Teams.  These Care Teams include your primary Cardiologist (physician) and Advanced Practice Providers (APPs -  Physician Assistants and Nurse Practitioners) who all work together to provide you with the care you need, when you need it.  Your next appointment:   9-12 month(s)  Provider:   Nicki Guadalajara, MD

## 2023-03-07 ENCOUNTER — Other Ambulatory Visit (HOSPITAL_COMMUNITY): Payer: Self-pay

## 2023-03-07 DIAGNOSIS — E113511 Type 2 diabetes mellitus with proliferative diabetic retinopathy with macular edema, right eye: Secondary | ICD-10-CM | POA: Diagnosis not present

## 2023-03-12 ENCOUNTER — Other Ambulatory Visit (HOSPITAL_COMMUNITY): Payer: Self-pay

## 2023-03-20 ENCOUNTER — Other Ambulatory Visit (HOSPITAL_COMMUNITY): Payer: Self-pay

## 2023-03-26 ENCOUNTER — Other Ambulatory Visit (HOSPITAL_COMMUNITY): Payer: Self-pay

## 2023-04-06 DIAGNOSIS — E113512 Type 2 diabetes mellitus with proliferative diabetic retinopathy with macular edema, left eye: Secondary | ICD-10-CM | POA: Diagnosis not present

## 2023-04-09 ENCOUNTER — Other Ambulatory Visit (HOSPITAL_COMMUNITY): Payer: Self-pay

## 2023-04-20 DIAGNOSIS — E113511 Type 2 diabetes mellitus with proliferative diabetic retinopathy with macular edema, right eye: Secondary | ICD-10-CM | POA: Diagnosis not present

## 2023-04-23 ENCOUNTER — Other Ambulatory Visit (HOSPITAL_COMMUNITY): Payer: Self-pay

## 2023-05-14 ENCOUNTER — Other Ambulatory Visit (HOSPITAL_COMMUNITY): Payer: Self-pay

## 2023-05-18 ENCOUNTER — Other Ambulatory Visit (HOSPITAL_COMMUNITY): Payer: Self-pay

## 2023-05-28 ENCOUNTER — Other Ambulatory Visit: Payer: Self-pay | Admitting: Internal Medicine

## 2023-05-28 ENCOUNTER — Other Ambulatory Visit (HOSPITAL_COMMUNITY): Payer: Self-pay

## 2023-05-28 DIAGNOSIS — I2102 ST elevation (STEMI) myocardial infarction involving left anterior descending coronary artery: Secondary | ICD-10-CM

## 2023-05-28 DIAGNOSIS — I2510 Atherosclerotic heart disease of native coronary artery without angina pectoris: Secondary | ICD-10-CM

## 2023-05-28 DIAGNOSIS — E785 Hyperlipidemia, unspecified: Secondary | ICD-10-CM

## 2023-05-28 MED ORDER — REPATHA SURECLICK 140 MG/ML ~~LOC~~ SOAJ
SUBCUTANEOUS | 11 refills | Status: DC
  Filled 2023-05-28: qty 6, 84d supply, fill #0
  Filled 2023-08-13: qty 6, 84d supply, fill #1
  Filled 2023-11-26: qty 6, 84d supply, fill #2
  Filled 2024-03-18: qty 6, 84d supply, fill #3

## 2023-05-30 DIAGNOSIS — M25511 Pain in right shoulder: Secondary | ICD-10-CM | POA: Diagnosis not present

## 2023-06-01 ENCOUNTER — Other Ambulatory Visit (HOSPITAL_COMMUNITY): Payer: Self-pay

## 2023-06-01 DIAGNOSIS — M25511 Pain in right shoulder: Secondary | ICD-10-CM | POA: Diagnosis not present

## 2023-06-01 DIAGNOSIS — E113511 Type 2 diabetes mellitus with proliferative diabetic retinopathy with macular edema, right eye: Secondary | ICD-10-CM | POA: Diagnosis not present

## 2023-06-01 MED ORDER — DIAZEPAM 5 MG PO TABS
5.0000 mg | ORAL_TABLET | ORAL | 0 refills | Status: AC
  Filled 2023-06-01: qty 2, 1d supply, fill #0

## 2023-06-04 ENCOUNTER — Other Ambulatory Visit (HOSPITAL_COMMUNITY): Payer: Self-pay

## 2023-06-11 ENCOUNTER — Other Ambulatory Visit (HOSPITAL_COMMUNITY): Payer: Self-pay

## 2023-06-11 DIAGNOSIS — H31091 Other chorioretinal scars, right eye: Secondary | ICD-10-CM | POA: Diagnosis not present

## 2023-06-11 DIAGNOSIS — E113512 Type 2 diabetes mellitus with proliferative diabetic retinopathy with macular edema, left eye: Secondary | ICD-10-CM | POA: Diagnosis not present

## 2023-06-11 DIAGNOSIS — E113511 Type 2 diabetes mellitus with proliferative diabetic retinopathy with macular edema, right eye: Secondary | ICD-10-CM | POA: Diagnosis not present

## 2023-06-11 DIAGNOSIS — H26491 Other secondary cataract, right eye: Secondary | ICD-10-CM | POA: Diagnosis not present

## 2023-06-11 DIAGNOSIS — H3582 Retinal ischemia: Secondary | ICD-10-CM | POA: Diagnosis not present

## 2023-06-15 ENCOUNTER — Other Ambulatory Visit (HOSPITAL_COMMUNITY): Payer: Self-pay

## 2023-06-26 ENCOUNTER — Other Ambulatory Visit: Payer: Self-pay | Admitting: Orthopedic Surgery

## 2023-06-26 DIAGNOSIS — M75100 Unspecified rotator cuff tear or rupture of unspecified shoulder, not specified as traumatic: Secondary | ICD-10-CM

## 2023-06-27 DIAGNOSIS — E11319 Type 2 diabetes mellitus with unspecified diabetic retinopathy without macular edema: Secondary | ICD-10-CM | POA: Diagnosis not present

## 2023-06-27 DIAGNOSIS — H26491 Other secondary cataract, right eye: Secondary | ICD-10-CM | POA: Diagnosis not present

## 2023-06-27 DIAGNOSIS — Z961 Presence of intraocular lens: Secondary | ICD-10-CM | POA: Diagnosis not present

## 2023-06-27 DIAGNOSIS — H53469 Homonymous bilateral field defects, unspecified side: Secondary | ICD-10-CM | POA: Diagnosis not present

## 2023-07-06 DIAGNOSIS — Z961 Presence of intraocular lens: Secondary | ICD-10-CM | POA: Diagnosis not present

## 2023-07-06 DIAGNOSIS — H3582 Retinal ischemia: Secondary | ICD-10-CM | POA: Diagnosis not present

## 2023-07-06 DIAGNOSIS — E113513 Type 2 diabetes mellitus with proliferative diabetic retinopathy with macular edema, bilateral: Secondary | ICD-10-CM | POA: Diagnosis not present

## 2023-07-06 DIAGNOSIS — H43392 Other vitreous opacities, left eye: Secondary | ICD-10-CM | POA: Diagnosis not present

## 2023-07-06 DIAGNOSIS — H53142 Visual discomfort, left eye: Secondary | ICD-10-CM | POA: Diagnosis not present

## 2023-07-09 ENCOUNTER — Other Ambulatory Visit (HOSPITAL_COMMUNITY): Payer: Self-pay

## 2023-07-13 ENCOUNTER — Other Ambulatory Visit (HOSPITAL_COMMUNITY): Payer: Self-pay

## 2023-07-16 ENCOUNTER — Other Ambulatory Visit: Payer: Self-pay | Admitting: Cardiovascular Disease

## 2023-07-16 ENCOUNTER — Other Ambulatory Visit: Payer: Self-pay

## 2023-07-16 ENCOUNTER — Other Ambulatory Visit (HOSPITAL_COMMUNITY): Payer: Self-pay

## 2023-07-16 MED ORDER — ENTRESTO 97-103 MG PO TABS
1.0000 | ORAL_TABLET | Freq: Two times a day (BID) | ORAL | 11 refills | Status: DC
  Filled 2023-07-16: qty 60, 30d supply, fill #0
  Filled 2023-08-13: qty 60, 30d supply, fill #1
  Filled 2023-10-08: qty 60, 30d supply, fill #2
  Filled 2023-11-12: qty 60, 30d supply, fill #3
  Filled 2023-12-10: qty 60, 30d supply, fill #4
  Filled 2024-01-18: qty 60, 30d supply, fill #5
  Filled 2024-02-12: qty 60, 30d supply, fill #6
  Filled 2024-03-11: qty 60, 30d supply, fill #7
  Filled 2024-04-14: qty 60, 30d supply, fill #8
  Filled 2024-04-17 – 2024-05-09 (×2): qty 60, 30d supply, fill #9
  Filled 2024-06-09: qty 60, 30d supply, fill #10
  Filled 2024-07-08: qty 60, 30d supply, fill #11

## 2023-07-16 MED ORDER — METFORMIN HCL 1000 MG PO TABS
ORAL_TABLET | ORAL | 3 refills | Status: DC
  Filled 2023-07-16: qty 135, 90d supply, fill #0
  Filled 2023-10-29: qty 135, 90d supply, fill #1
  Filled 2024-02-04: qty 135, 90d supply, fill #2
  Filled 2024-04-14 – 2024-04-16 (×2): qty 135, 90d supply, fill #3

## 2023-07-19 DIAGNOSIS — E113511 Type 2 diabetes mellitus with proliferative diabetic retinopathy with macular edema, right eye: Secondary | ICD-10-CM | POA: Diagnosis not present

## 2023-07-20 ENCOUNTER — Ambulatory Visit
Admission: RE | Admit: 2023-07-20 | Discharge: 2023-07-20 | Disposition: A | Discharge status: Home / Self Care | Payer: 59 | Source: Ambulatory Visit | Attending: Orthopedic Surgery | Admitting: Orthopedic Surgery

## 2023-07-20 DIAGNOSIS — M75100 Unspecified rotator cuff tear or rupture of unspecified shoulder, not specified as traumatic: Secondary | ICD-10-CM

## 2023-07-30 ENCOUNTER — Other Ambulatory Visit: Payer: Self-pay | Admitting: Cardiology

## 2023-07-30 ENCOUNTER — Other Ambulatory Visit: Payer: Self-pay

## 2023-07-30 ENCOUNTER — Other Ambulatory Visit (HOSPITAL_COMMUNITY): Payer: Self-pay

## 2023-08-01 ENCOUNTER — Other Ambulatory Visit (HOSPITAL_COMMUNITY): Payer: Self-pay

## 2023-08-01 MED ORDER — CLOPIDOGREL BISULFATE 75 MG PO TABS
75.0000 mg | ORAL_TABLET | Freq: Every day | ORAL | 1 refills | Status: DC
  Filled 2023-08-01: qty 90, 90d supply, fill #0
  Filled 2024-03-24 – 2024-03-26 (×2): qty 90, 90d supply, fill #1

## 2023-08-06 ENCOUNTER — Other Ambulatory Visit: Payer: Self-pay

## 2023-08-06 ENCOUNTER — Other Ambulatory Visit (HOSPITAL_COMMUNITY): Payer: Self-pay

## 2023-08-06 MED ORDER — AMLODIPINE BESYLATE 5 MG PO TABS
5.0000 mg | ORAL_TABLET | Freq: Every day | ORAL | 3 refills | Status: DC
  Filled 2023-08-06: qty 90, 90d supply, fill #0
  Filled 2023-11-12: qty 90, 90d supply, fill #1
  Filled 2024-02-04: qty 90, 90d supply, fill #2
  Filled 2024-04-14 – 2024-04-16 (×2): qty 90, 90d supply, fill #3

## 2023-08-07 ENCOUNTER — Other Ambulatory Visit (HOSPITAL_COMMUNITY): Payer: Self-pay

## 2023-08-13 DIAGNOSIS — E113512 Type 2 diabetes mellitus with proliferative diabetic retinopathy with macular edema, left eye: Secondary | ICD-10-CM | POA: Diagnosis not present

## 2023-08-16 ENCOUNTER — Other Ambulatory Visit (HOSPITAL_COMMUNITY): Payer: Self-pay

## 2023-08-20 ENCOUNTER — Other Ambulatory Visit (HOSPITAL_COMMUNITY): Payer: Self-pay

## 2023-08-23 ENCOUNTER — Other Ambulatory Visit: Payer: Self-pay

## 2023-08-23 ENCOUNTER — Other Ambulatory Visit: Payer: Self-pay | Admitting: Cardiovascular Disease

## 2023-08-23 ENCOUNTER — Other Ambulatory Visit (HOSPITAL_COMMUNITY): Payer: Self-pay

## 2023-08-23 DIAGNOSIS — I4892 Unspecified atrial flutter: Secondary | ICD-10-CM

## 2023-08-23 MED ORDER — APIXABAN 5 MG PO TABS
5.0000 mg | ORAL_TABLET | Freq: Two times a day (BID) | ORAL | 1 refills | Status: DC
  Filled 2023-08-23: qty 180, 90d supply, fill #0
  Filled 2024-03-18: qty 180, 90d supply, fill #1

## 2023-08-23 NOTE — Telephone Encounter (Signed)
Prescription refill request for Eliquis received. Indication: Aflutter Last office visit: 02/16/23 Molli Hazard)  Scr:  1.06 (02/16/23 via PCP)  Age: 52 Weight: 135.9kg   Labs overdue. Called PCP and spoke with Dewayne Hatch in medical records. Scr was drawn by PCP in April. Requested labs be faxed to our office. Refill sent.

## 2023-08-28 DIAGNOSIS — I255 Ischemic cardiomyopathy: Secondary | ICD-10-CM | POA: Diagnosis not present

## 2023-08-28 DIAGNOSIS — E113593 Type 2 diabetes mellitus with proliferative diabetic retinopathy without macular edema, bilateral: Secondary | ICD-10-CM | POA: Diagnosis not present

## 2023-08-28 DIAGNOSIS — Z0000 Encounter for general adult medical examination without abnormal findings: Secondary | ICD-10-CM | POA: Diagnosis not present

## 2023-08-28 DIAGNOSIS — E7800 Pure hypercholesterolemia, unspecified: Secondary | ICD-10-CM | POA: Diagnosis not present

## 2023-08-28 DIAGNOSIS — I10 Essential (primary) hypertension: Secondary | ICD-10-CM | POA: Diagnosis not present

## 2023-08-28 DIAGNOSIS — Z23 Encounter for immunization: Secondary | ICD-10-CM | POA: Diagnosis not present

## 2023-08-28 DIAGNOSIS — G4733 Obstructive sleep apnea (adult) (pediatric): Secondary | ICD-10-CM | POA: Diagnosis not present

## 2023-08-28 DIAGNOSIS — Z125 Encounter for screening for malignant neoplasm of prostate: Secondary | ICD-10-CM | POA: Diagnosis not present

## 2023-08-28 DIAGNOSIS — Z794 Long term (current) use of insulin: Secondary | ICD-10-CM | POA: Diagnosis not present

## 2023-08-28 DIAGNOSIS — I2510 Atherosclerotic heart disease of native coronary artery without angina pectoris: Secondary | ICD-10-CM | POA: Diagnosis not present

## 2023-09-10 ENCOUNTER — Other Ambulatory Visit (HOSPITAL_COMMUNITY): Payer: Self-pay

## 2023-09-12 ENCOUNTER — Ambulatory Visit: Payer: 59 | Attending: Cardiovascular Disease | Admitting: Cardiovascular Disease

## 2023-09-12 ENCOUNTER — Encounter: Payer: Self-pay | Admitting: Cardiovascular Disease

## 2023-09-12 DIAGNOSIS — E785 Hyperlipidemia, unspecified: Secondary | ICD-10-CM

## 2023-09-12 DIAGNOSIS — I2102 ST elevation (STEMI) myocardial infarction involving left anterior descending coronary artery: Secondary | ICD-10-CM | POA: Diagnosis not present

## 2023-09-12 DIAGNOSIS — I255 Ischemic cardiomyopathy: Secondary | ICD-10-CM

## 2023-09-12 DIAGNOSIS — I10 Essential (primary) hypertension: Secondary | ICD-10-CM | POA: Diagnosis not present

## 2023-09-12 DIAGNOSIS — H3320 Serous retinal detachment, unspecified eye: Secondary | ICD-10-CM | POA: Diagnosis not present

## 2023-09-12 DIAGNOSIS — G4733 Obstructive sleep apnea (adult) (pediatric): Secondary | ICD-10-CM | POA: Diagnosis not present

## 2023-09-12 DIAGNOSIS — E119 Type 2 diabetes mellitus without complications: Secondary | ICD-10-CM | POA: Diagnosis not present

## 2023-09-12 NOTE — Patient Instructions (Signed)
Medication Instructions:  No medication changes *If you need a refill on your cardiac medications before your next appointment, please call your pharmacy*   Lab Work: No labs ordered If you have labs (blood work) drawn today and your tests are completely normal, you will receive your results only by: MyChart Message (if you have MyChart) OR A paper copy in the mail If you have any lab test that is abnormal or we need to change your treatment, we will call you to review the results.   Testing/Procedures: No testing ordered   Follow-Up: At [facility], you and your health needs are our priority.  As part of our continuing mission to provide you with exceptional heart care, we have created designated Provider Care Teams.  These Care Teams include your primary Cardiologist (physician) and Advanced Practice Providers (APPs -  Physician Assistants and Nurse Practitioners) who all work together to provide you with the care you need, when you need it.  We recommend signing up for the patient portal called "MyChart".  Sign up information is provided on this After Visit Summary.  MyChart is used to connect with patients for Virtual Visits (Telemedicine).  Patients are able to view lab/test results, encounter notes, upcoming appointments, etc.  Non-urgent messages can be sent to your provider as well.   To learn more about what you can do with MyChart, go to [email or web address].    Your next appointment:   4 month(s)  Provider:   Nicki Guadalajara, MD     Other Instructions If you have any questions or concerns regarding your c-pap, bi-pap or sleep accessories, please contact Brandie Rorie at [phone].

## 2023-09-12 NOTE — Progress Notes (Signed)
Cardiology Office Note    Date:  09/21/2023   ID:  [name] Merritt, DOB Nov 19, 1970, MRN [id number]  PCP:  Renford Dills, MD  Cardiologist:  Nicki Guadalajara, MD   16 month F/u cardiology/sleep evaluation    History of Present Illness:  [name] Merritt is a 52 y.o. male who has a history of obesity, hypertension, hyperlipidemia, insulin-dependent diabetes mellitus, as well as diagnosed, but not treated sleep apnea.  On May 29, 2020 he experienced recurrent intermittent episodes of chest tightness for the majority of the day.  His symptoms ultimately resolved.  In the early morning at 5 AM on that evening he developed severe chest pain and presented to the emergency room where ECG showed acute anterolateral ST segment elevation.  A code STEMI was activated.  He was taken emergently to the catheterization laboratory and was found to have total occlusion of the mid LAD with TIMI 0 flow and absence of collaterals.  There was concomitant CAD with tortuous first diagonal vessel with 70% narrowing beyond a sharp bend, 20% narrowing beyond a sharp bend in the ramus immediate vessel, 50% focal OM 3 stenosis in a dominant left circumflex coronary artery and 70% focal stenosis in the midportion of a small PLA branch.  He had a normal small nondominant RCA.  He underwent successful PCI to the mid LAD with ultimate insertion of a Resolute Onyx 3.0 x 22 mm stent postdilated to 3.25 mm with restoration of TIMI-3 flow. I had not seen him since his initial acute catheterization and intervention.  An echo Doppler study on January 30, 2020 showed an EF of 35 to 40% with akinesis of the mid septum extending to the apex consistent with his LAD infarction.  He had grade 2 diastolic dysfunction.  He was initially placed on metoprolol which was switched to carvedilol.  He was started on Entresto and high intensity statin therapy.  He also was enrolled in the AEGIS trial.  When he presented for his infusion as part of the  AEGIS  Trial on June 08, 2020 he was noted to be in atrial flutter.  Earlier he had taken Sudafed for nasal congestion.  He was subsequently sent to the emergency room.  Carvedilol was switched back to metoprolol for improved rate control, he was started on Eliquis and Brilinta was changed to Plavix.  He underwent TEE DC cardioversion by Dr. Jerene Pitch on June 10, 2020 and post procedure he was discharged on Plavix/Eliquis.  He was seen by Azalee Course, PA on July 01, 2020.  At that time, he was was stable and did not have any recurrent chest pain.  He was maintaining sinus rhythm.  With his echo Doppler showing EF of 35 to 40% Entresto was increased to 49/51 mg twice a day.  He was on insulin for diabetes mellitus.  Of note, prior to his initial MI, he had undergone a home sleep study on April 28, 2020 which demonstrated moderate overall sleep apnea with an AHI of 15.4 but he had significant oxygen desaturation to a nadir of 74%.  He spent 54.4 minutes of the test with saturation less than 89%.  Apparently there was never any follow-up of this and he never had been contacted by pulmonary for CPAP titration.    I saw him January 28, 2021 at which time he denied any recurrent chest pain.  He was on Entresto 49/51 mg twice a day, Farxiga 5 mg, metoprolol succinate 25 mg daily for his  cardiomyopathy.  He is on Plavix and Eliquis for anticoagulation.  He is on atorvastatin 80 mg daily for hyperlipidemia.  He continues to be on Lantus insulin.  During his initial office evaluation with me, he was significantly hypertensive with stage II hypertension and I recommended further titration of Entresto to 97/103 mg twice a day.  I also added spironolactone 12.5 mg daily to his medical regimen particularly with his blood pressure elevation and LV dysfunction.  I reviewed his sleep study from July 2021 which confirmed at least moderate overall sleep apnea but I was concerned with his significant oxygen desaturation to a nadir  of 74% perhaps suggesting more severe sleep apnea during REM sleep.  Unfortunately, since 9 months had elapsed since his initial evaluation it was required that he undergo a new assessment and recommended he undergo a split-night sleep study such that CPAP titration could be done at the same time.  I recommended follow-up laboratory.  I recommended a follow-up echo Doppler study following further titration of Entresto.   I last saw him on November 17, 2021.  Since his prior evaluation he felt improved with reference to not experiencing any anginal symptomatology.  His blood pressure has continued to be elevated despite taking Entresto 97/103 twice a day, Farxiga 5 mg daily, metoprolol succinate 25 mg daily and spironolactone 12.5 mg.  He is now on Repatha and atorvastatin 80 mg for hyperlipidemia.  His follow-up echo Doppler study on Mar 08, 2021 showed significant improvement in LV function now at 55 to 60% ejection fraction.  There were no wall motion abnormalities.  There was mild LVH and he had normal diastolic parameters.  There was mild aortic sclerosis without stenosis.  He underwent his split-night sleep evaluation on March 28, 2021.  This confirmed severe sleep apnea with an AHI of 35.5.  Events were very severe during REM sleep with an AHI of 91.4/h.  CPAP was initiated at 6 cm and was titrated to 14 cm water pressure AHI was 0 but this was without REM sleep.  Snoring persisted until 14 cm of water.  He initiated CPAP therapy on October 11, 2021 and received a ResMed air sense 11 AutoSet unit with [employer] as his DME company.  Initial download was obtained from October 18, 2021 through November 16, 2021 which showed extremely poor compliance with usage days and only 20%.  He essentially was not using therapy with average use at only 34 minutes.  His CPAP setting range was 12 to 18 cm and AHI was 1.0 with 95th percentile at 11.  However undoubtedly this data is unreliable due to very poor sleep efficiency and  certainly no REM sleep.  During that evaluation, I spent considerable time with him discussing the importance of sleep apnea treatment regarding his cardiovascular health and that potentially he is at risk if he is not compliant within a 90-day window that his CPAP may be taken back by his DME company.  I saw him on December 28, 2021.  Since his prior evaluation he felt well.  He was trying hard to use CPAP and admits that he feels better with use.  I obtained a new download from February 6 through December 27, 2021.  He is compliant with usage days at 87% but unfortunately he is still not compliant with usage greater than 4 hours which needs to be at least 70%.  His average use is 3 hours and 47 minutes per night.  However with therapy set at a  minimum pressure of 12 and maximum of 18, AHI is excellent at 2.3.  His 95th percentile pressure is 15.5 with maximum average pressure 16.  He denied any chest pain.  He has lost 6 pounds since his most recent evaluation.    I last saw him on May 11, 2022.  He had undergone colonoscopy and was found to have 2 small polyps.  He ontinued to use CPAP.  However he recently has had several eye surgeries and has been getting injections into his eye every 2 weeks.  As a result of his full facemask, this is led to leak with irritation to his eyes and he also had had recent corneal abrasion.  His DME was changed to [employer].  It was suggested that he change to a nasal type mask.  As result of his issues, over the past month his use of CPAP has been minimal with only 1 day use.  He also has had issues with hand weakness and numbness of his lateral aspect of his third fourth and fifth fingers bilaterally suggesting ulnar nerve issues.  Apparently he is scheduled to undergo neurologic testing in the upcoming month.  He denies any chest pain.  He has been sleeping in a recliner.  He continues to be on amlodipine 5 mg, Entresto 97/103 mg twice a day, Farxiga 10 mg daily, metoprolol succinate 50  mg and spironolactone 25 mg with his previous LV dysfunction which had improved.  He is on metformin in addition to [place] for diabetes.  He continues to be on atorvastatin 80 mg and Repatha for aggressive lipid-lowering therapy.  LDL cholesterol in February 2023 was 79.    He saw Edd Fabian, NP on February 16, 2023 in follow-up of his ischemic cardiomyopathy, atrial flutter and CAD.  He had only been taking atorvastatin 3 days/week and was on Repatha.  He was noncompliant with CPAP due to eye injections.  Repeat laboratory was recommended.  Since I last saw him, he continues to be followed by Dr. Nehemiah Settle.  He had retinal detachment, continues to undergo every 3-week injections in his eye with [facility].  He last used CPAP approximately 2 months ago.  He had been sleeping upright in the recliner.  For the last 2 to 3 weeks he is now been trying to sleep back in bed.  He had issues with his prior mask.  Typically he goes to bed between 10 PM and midnight wakes up between 8 and 9 AM.  He continues to be on amlodipine 5 mg, Entresto 97/103 mg twice a day, Farxiga 10 mg daily, metoprolol succinate 50 mg daily and spironolactone 12.5 mg twice a day for guideline directed medical therapy.  He continues to be on Eliquis for anticoagulation.  He is on atorvastatin 80 mg and Repatha injections.  He is diabetic on metformin and has been undergoing Ozempic injections weekly.  He presents for evaluation.   Past Medical History:  Diagnosis Date   Anxiety    CHF (congestive heart failure) (HCC)    Coronary artery disease    Diabetes mellitus without complication (HCC)    type II   GERD (gastroesophageal reflux disease)    not currently   High cholesterol    Hypertension    Myocardial infarction (HCC) 05/30/2020   New onset atrial flutter (HCC) 06/09/2020   Overweight    Sleep apnea     Past Surgical History:  Procedure Laterality Date   CARDIOVERSION N/A 06/10/2020   Procedure: CARDIOVERSION;  Surgeon:  Little Ishikawa, MD;  Location: [facility] ENDOSCOPY;  Service: Cardiovascular;  Laterality: N/A;   CARPAL TUNNEL RELEASE Right 07/11/2022   Procedure: RIGHT CARPAL TUNNEL RELEASE;  Surgeon: Betha Loa, MD;  Location: [facility];  Service: Orthopedics;  Laterality: Right;  AXILLARY BLOCK   CARPAL TUNNEL RELEASE Left 08/24/2022   Procedure: LEFT CARPAL TUNNEL RELEASE;  Surgeon: Betha Loa, MD;  Location: [facility];  Service: Orthopedics;  Laterality: Left;  60 MIN   CORONARY STENT INTERVENTION N/A 05/30/2020   Procedure: CORONARY STENT INTERVENTION;  Surgeon: Lennette Bihari, MD;  Location: [facility] INVASIVE CV LAB;  Service: Cardiovascular;  Laterality: N/A;   CORONARY/GRAFT ACUTE MI REVASCULARIZATION N/A 05/30/2020   Procedure: Coronary/Graft Acute MI Revascularization;  Surgeon: Lennette Bihari, MD;  Location: [facility] INVASIVE CV LAB;  Service: Cardiovascular;  Laterality: N/A;   GAS INSERTION Right 07/19/2021   Procedure: INSERTION OF GAS - C3F8;  Surgeon: Carmela Rima, MD;  Location: [facility] OR;  Service: Ophthalmology;  Laterality: Right;   GAS/FLUID EXCHANGE Right 07/19/2021   Procedure: GAS/FLUID EXCHANGE;  Surgeon: Carmela Rima, MD;  Location: [facility] OR;  Service: Ophthalmology;  Laterality: Right;   LEFT HEART CATH AND CORONARY ANGIOGRAPHY N/A 05/30/2020   Procedure: LEFT HEART CATH AND CORONARY ANGIOGRAPHY;  Surgeon: Lennette Bihari, MD;  Location: [facility] INVASIVE CV LAB;  Service: Cardiovascular;  Laterality: N/A;   MEMBRANE PEEL Right 07/19/2021   Procedure: MEMBRANE PEEL;  Surgeon: Carmela Rima, MD;  Location: [facility] OR;  Service: Ophthalmology;  Laterality: Right;   PARS PLANA VITRECTOMY 27 GAUGE Right 09/27/2021   Procedure: PARS PLANA VITRECTOMY 27 GAUGE;  Surgeon: Carmela Rima, MD;  Location: [facility] OR;  Service: Ophthalmology;  Laterality: Right;   PHOTOCOAGULATION WITH LASER Right 07/19/2021   Procedure: PHOTOCOAGULATION WITH LASER;  Surgeon: Carmela Rima, MD;  Location:  [facility] OR;  Service: Ophthalmology;  Laterality: Right;   PHOTOCOAGULATION WITH LASER Right 09/27/2021   Procedure: PHOTOCOAGULATION WITH LASER;  Surgeon: Carmela Rima, MD;  Location: [facility] OR;  Service: Ophthalmology;  Laterality: Right;   REPAIR OF COMPLEX TRACTION RETINAL DETACHMENT Right 07/19/2021   Procedure: REPAIR OF COMPLEX TRACTION RETINAL DETACHMENT;  Surgeon: Carmela Rima, MD;  Location: [facility] OR;  Service: Ophthalmology;  Laterality: Right;   TEE WITHOUT CARDIOVERSION N/A 06/10/2020   Procedure: TRANSESOPHAGEAL ECHOCARDIOGRAM (TEE);  Surgeon: Little Ishikawa, MD;  Location: [facility] ENDOSCOPY;  Service: Cardiovascular;  Laterality: N/A;   torn rotator cuff  2018   2018   ULNAR NERVE TRANSPOSITION Right 07/11/2022   Procedure: RIGHT ULNAR NERVE DECOMPRESSION CUBITAL TUNNEL WITH POSSIBLE TRANSPOSITION;  Surgeon: Betha Loa, MD;  Location: [facility];  Service: Orthopedics;  Laterality: Right;  AXILLARY BLOCK   ULNAR NERVE TRANSPOSITION Left 08/24/2022   Procedure: LEFT ULNAR NERVE DECOMPRESSION;  Surgeon: Betha Loa, MD;  Location:  SURGERY CENTER;  Service: Orthopedics;  Laterality: Left;    Current Medications: Outpatient Medications Prior to Visit  Medication Sig Dispense Refill   acetaminophen (TYLENOL) 650 MG CR tablet Take 1,300 mg by mouth every 8 (eight) hours as needed for pain.     albuterol (VENTOLIN HFA) 108 (90 Base) MCG/ACT inhaler Inhale 2 puffs into the lungs every 6 (six) hours as needed for wheezing or shortness of breath.     amLODipine (NORVASC) 5 MG tablet Take 1 tablet (5 mg total) by mouth daily. 90 tablet 3   apixaban (ELIQUIS) 5 MG TABS tablet Take 1 tablet (5 mg total) by mouth 2 (  two) times daily. 180 tablet 1   Ascorbic Acid (VITAMIN C) 1000 MG tablet Take 1,000 mg by mouth daily.     atorvastatin (LIPITOR) 80 MG tablet Take 1 tablet (80 mg total) by mouth 3 (three) times a week. 90 tablet 3   cetirizine (ZYRTEC) 10 MG tablet TAKE 1  TABLET(10 MG) BY MOUTH DAILY 30 tablet 5   CINNAMON PO Take 1,500 mg by mouth 2 (two) times daily.     clopidogrel (PLAVIX) 75 MG tablet Take 1 tablet (75 mg total) by mouth daily. 90 tablet 1   dapagliflozin propanediol (FARXIGA) 10 MG TABS tablet Take 1 tablet (10 mg total) by mouth daily. 30 tablet 11   diazepam (VALIUM) 5 MG tablet Take 1 tablet (5 mg total) by mouth once for 1 dose 30 minutes prior to procedure, may repeat if needed. 2 tablet 0   Evolocumab (REPATHA SURECLICK) 140 MG/ML SOAJ Inject 1 Dose into the skin every 14 (fourteen) days. 2 mL 11   HYDROcodone-acetaminophen (NORCO) 5-325 MG tablet Take 1-2 tablets by mouth every 6 (six) hours as needed forpain 20 tablet 0   metFORMIN (GLUCOPHAGE) 1000 MG tablet Take 1 tablet (1,000 mg total) in the morning AND 1/2 tablet (500 mg total) every evening with meals 135 tablet 3   metoprolol succinate (TOPROL-XL) 25 MG 24 hr tablet Take 2 tablets (50 mg total) by mouth daily. 180 tablet 3   nitroGLYCERIN (NITROSTAT) 0.4 MG SL tablet Place 1 tablet (0.4 mg total) under the tongue every 5 (five) minutes as needed for chest pain. 25 tablet 3   nitroGLYCERIN (NITROSTAT) 0.4 MG SL tablet Take 1 tablet under the tongue as needed for chest pain every 5 mins up to three times; if no relief, seek medical attention 25 tablet 3   pantoprazole (PROTONIX) 40 MG tablet Take 1 tablet (40 mg total) by mouth 2 (two) times daily. 180 tablet 3   polyethylene glycol-electrolytes (NULYTELY) 420 g solution Use as directed 4000 mL 0   sacubitril-valsartan (ENTRESTO) 97-103 MG Take 1 tablet by mouth 2 (two) times daily. 60 tablet 11   Semaglutide,0.25 or 0.5MG /DOS, (OZEMPIC, 0.25 OR 0.5 MG/DOSE,) 2 MG/3ML SOPN Inject 0.25 mg into the skin once a week. 3 mL 5   Semaglutide,0.25 or 0.5MG /DOS, (OZEMPIC, 0.25 OR 0.5 MG/DOSE,) 2 MG/3ML SOPN Inject 0.5 mg into the skin once a week. 3 mL 5   sildenafil (REVATIO) 20 MG tablet Take 20 mg by mouth daily as needed for erectile  dysfunction.     sildenafil (REVATIO) 20 MG tablet Take 1-5 tablets (20-100 mg total) by mouth daily as needed. 100 tablet 1   spironolactone (ALDACTONE) 25 MG tablet Take 1/2 tablet (12.5 mg total) by mouth 2 (two) times daily. 30 tablet 11   traZODone (DESYREL) 100 MG tablet Take 1 tablet (100 mg total) by mouth at bedtime. 90 tablet 3   zinc gluconate 50 MG tablet Take 50 mg by mouth daily.     No facility-administered medications prior to visit.     Allergies:   Pineapple, Tomato, and Rosuvastatin calcium   Social History   Socioeconomic History   Marital status: Married    Spouse name: Not on file   Number of children: Not on file   Years of education: Not on file   Highest education level: Not on file  Occupational History   Occupation: maintenance  Tobacco Use   Smoking status: Never   Smokeless tobacco: Former    Types:  Dorna Bloom    Quit date: 2017  Vaping Use   Vaping status: Never Used  Substance and Sexual Activity   Alcohol use: Not Currently   Drug use: Not Currently    Types: Marijuana    Comment: 2006- last time   Sexual activity: Not on file  Other Topics Concern   Not on file  Social History Narrative   Not on file   Social Determinants of Health   Financial Resource Strain: Not on file  Food Insecurity: Not on file  Transportation Needs: Not on file  Physical Activity: Not on file  Stress: Not on file  Social Connections: Not on file    Socially, he is married most recently for 2 years.  He has 6 boys and a daughter.  He previously was working as a Data processing manager.  Family History:  The patient's family history includes Diabetes in his father, maternal grandfather, maternal grandmother, mother, paternal grandfather, and paternal grandmother; Hypertension in his father, maternal grandfather, maternal grandmother, mother, paternal grandfather, and paternal grandmother.   His father is 58 and has dementia and had a history of MI.  Mother is 57 and  has history of MI.  ROS General: Negative; No fevers, chills, or night sweats;  HEENT: Retinal detachment, undergoing every 3 to 4-week injections; no change in hearing, sinus congestion, difficulty swallowing Pulmonary: Negative; No cough, wheezing, shortness of breath, hemoptysis Cardiovascular: Positive for hypertension, hyperlipidemia, CAD,. no recurrent chest pain. GI: Negative; No nausea, vomiting, diarrhea, or abdominal pain GU: Negative; No dysuria, hematuria, or difficulty voiding Musculoskeletal: Negative; no myalgias, joint pain, or weakness Hematologic/Oncology: Negative; no easy bruising, bleeding Endocrine: Positive for diabetes Neuro: Negative; no changes in balance, headaches Skin: Negative; No rashes or skin lesions Psychiatric: Negative; No behavioral problems, depression Sleep: OSA, with CPAP, currently noncompliant ;positive for snoring, OSA on home sleep study; no bruxism, restless legs, hypnogognic hallucinations, no cataplexy Other comprehensive 14 point system review is negative.   PHYSICAL EXAM:   VS:  BP 136/76   Pulse 62   Ht 6\' 1"  (1.854 m)   Wt 288 lb (130.6 kg)   SpO2 95%   BMI 38.00 kg/m     Repeat blood pressure by me was 136/80  Wt Readings from Last 3 Encounters:  09/12/23 288 lb (130.6 kg)  02/16/23 299 lb 9.6 oz (135.9 kg)  08/24/22 (!) 308 lb 6.8 oz (139.9 kg)    General: Alert, oriented, no distress.  He has had purposeful weight loss of approximately 20 pounds over the past 4 months Skin: normal turgor, no rashes, warm and dry HEENT: Normocephalic, atraumatic. Pupils equal round and reactive to light; sclera anicteric; extraocular muscles intact;  Nose without nasal septal hypertrophy Mouth/Parynx: Large tongue Mallinpatti scale 4 Neck: Thick neck; no JVD, no carotid bruits; normal carotid upstroke Lungs: clear to ausculatation and percussion; no wheezing or rales Chest wall: without tenderness to palpitation Heart: PMI not displaced,  RRR, s1 s2 normal, 1/6 systolic murmur, no diastolic murmur, no rubs, gallops, thrills, or heaves Abdomen: soft, nontender; no hepatosplenomehaly, BS+; abdominal aorta nontender and not dilated by palpation. Back: no CVA tenderness Pulses 2+ Musculoskeletal: full range of motion, normal strength, no joint deformities Extremities: no clubbing cyanosis or edema, Homan's sign negative  Neurologic: grossly nonfocal; Cranial nerves grossly wnl Psychologic: Normal mood and affect    Studies/Labs Reviewed:   EKG Interpretation Date/Time:  Wednesday September 12 2023 15:49:46 EST Ventricular Rate:  57 PR Interval:  184 QRS Duration:  100 QT Interval:  402 QTC Calculation: 391 R Axis:   -23  Text Interpretation: Sinus bradycardia Inferior infarct (cited on or before 10-Jun-2020) Anterior infarct (cited on or before 10-Jun-2020) When compared with ECG of 11-Jun-2020 05:04, Questionable change in initial forces of Lateral leads Non-specific change in ST segment in Inferior leads ST no longer elevated in Anterior leads T wave inversion no longer evident in Anterolateral leads QT has shortened Confirmed by Nicki Guadalajara ([id number]) on 09/12/2023 4:55:24 PM    May 11, 2022 ECG (independently read by me): Sinus bradycardia at 52 PRWP anteriorly, Q III aVF  December 28, 2021 ECG (independently read by me): NSR at 62; Inferior and anterior Q waves; no ectopy, normal intervals  November 17, 2021 ECG (independently read by me): NSR at 65, Inferior  and anteriro Q waves ; no ectopy, normal intervals  January 28, 2021 ECG (independently read by me): Sinus bradycardia at 55; QS anteriorly c/w old Ant MI;  Recent Labs:    Latest Ref Rng & Units 08/21/2022    3:13 PM 07/07/2022    2:00 PM 12/07/2021    4:19 PM  BMP  Glucose 70 - 99 mg/dL 604  540  981   BUN 6 - 20 mg/dL 18  15  21    Creatinine 0.61 - 1.24 mg/dL 1.91  4.78  2.95   BUN/Creat Ratio 9 - 20   18   Sodium 135 - 145 mmol/L 139  139  141   Potassium  3.5 - 5.1 mmol/L 5.0  4.7  5.2   Chloride 98 - 111 mmol/L 104  108  103   CO2 22 - 32 mmol/L 23  23  24    Calcium 8.9 - 10.3 mg/dL 9.3  8.8  9.6         Latest Ref Rng & Units 12/07/2021    4:19 PM 01/28/2021   10:00 AM 07/29/2020   10:24 AM  Hepatic Function  Total Protein 6.0 - 8.5 g/dL 7.2  7.0  6.9   Albumin 4.0 - 5.0 g/dL 4.6  4.2  4.3   AST 0 - 40 IU/L 23  17  15    ALT 0 - 44 IU/L 23  18  13    Alk Phosphatase 44 - 121 IU/L 73  83  77   Total Bilirubin 0.0 - 1.2 mg/dL 0.3  0.5  0.6   Bilirubin, Direct 0.00 - 0.40 mg/dL   6.21        Latest Ref Rng & Units 09/27/2021    1:48 PM 07/19/2021    1:05 PM 01/28/2021   10:00 AM  CBC  WBC 4.0 - 10.5 K/uL  4.2  4.8   Hemoglobin 13.0 - 17.0 g/dL 30.8  65.7  84.6   Hematocrit 39.0 - 52.0 % 46.0  44.3  42.2   Platelets 150 - 400 K/uL  248  259    Lab Results  Component Value Date   MCV 96.9 07/19/2021   MCV 90 01/28/2021   MCV 92 07/01/2020   Lab Results  Component Value Date   TSH 2.440 01/28/2021   Lab Results  Component Value Date   HGBA1C 6.7 (H) 01/28/2021     BNP No results found for: "BNP"  ProBNP    Component Value Date/Time   PROBNP 267 (H) 01/28/2021 1000     Lipid Panel     Component Value Date/Time   CHOL 179 01/28/2021 1000   TRIG 52 01/28/2021 1000   HDL  45 01/28/2021 1000   CHOLHDL 4.0 01/28/2021 1000   CHOLHDL 5.1 05/30/2020 0952   VLDL 15 05/30/2020 0952   LDLCALC 124 (H) 01/28/2021 1000   LABVLDL 10 01/28/2021 1000     RADIOLOGY: No results found.   Additional studies/ records that were reviewed today include:   EMERGENT CATH/PCI: 05/30/2020 3rd Mrg lesion is 50% stenosed. LPDA lesion is 70% stenosed. LPAV-1 lesion is 30% stenosed. LPAV-2 lesion is 30% stenosed. 1st Diag lesion is 70% stenosed. Ramus lesion is 20% stenosed. Mid LAD lesion is 100% stenosed. Post intervention, there is a 0% residual stenosis. A stent was successfully placed.   Acute anterolateral ST segment  elevation myocardial infarction secondary to total occlusion of the mid LAD with TIMI 0 flow and absence of collaterals.   Concomitant CAD with tortuous 1st diagonal vessel with 70% narrowing beyond a sharp bend in the vessel, 20% narrowing beyond a sharp bend in the ramus immediate vessel, and dominant left circumflex coronary artery with 50% focal OM 3 stenosis, distal 30% stenoses in the AV groove, and focal 70% stenosis in the midportion of a small PLA branch.  Normal small nondominant RCA.   LVEDP 12 millimeters Hg.    Successful PCI to the totally occluded mid LAD with ultimate insertion of a Resolute Onyx 3.0 x 22 mm DES stent postdilated to 3.25 mm with digital narrowing of 0% and restoration of TIMI-3 flow.   RECOMMENDATION: DAPT for minimum of 1 year.  Aggressive lipid-lowering therapy with titration of atorvastatin to 80 mg daily.  We will reinitiate ARB therapy with losartan initially at lower dose, add low-dose beta-blocker therapy, and obtain 2D echo Doppler study in a.m. optimal blood pressure control with target blood pressure less than 130/80 and ideal blood pressure less than 120/80.  We will diabetes control.  If patient has LV dysfunction, SGLT 2 inhibition can be considered.   ECHO 05/31/2020 IMPRESSIONS   1. Akinesis of the mid septum into the apex consistent with LAD  infarction. Contrast imaging shows no evidence of LV thrombus. Left  ventricular ejection fraction, by estimation, is 35 to 40%. The left  ventricle has moderately decreased function. The  left ventricle demonstrates regional wall motion abnormalities (see  scoring diagram/findings for description). Left ventricular diastolic  parameters are consistent with Grade II diastolic dysfunction  (pseudonormalization). Elevated left atrial pressure.   2. Right ventricular systolic function is normal. The right ventricular  size is normal. There is normal pulmonary artery systolic pressure. The  estimated right  ventricular systolic pressure is 19.3 mmHg.   3. The mitral valve is grossly normal. Trivial mitral valve  regurgitation. No evidence of mitral stenosis.   4. The aortic valve is tricuspid. Aortic valve regurgitation is not  visualized. Mild aortic valve sclerosis is present, with no evidence of  aortic valve stenosis.   5. The inferior vena cava is normal in size with greater than 50%  respiratory variability, suggesting right atrial pressure of 3 mmHg.   Conclusion(s)/Recommendation(s): Findings consistent with ischemic  cardiomyopathy.    TEE 06/10/2020 IMPRESSIONS   1. Left ventricular ejection fraction, by estimation, is 40 to 45%. The  left ventricle has mildly decreased function. The left ventricle  demonstrates regional wall motion abnormalities. Apical hypokinesis.   2. Right ventricular systolic function is normal. The right ventricular  size is normal.   3. The mitral valve is normal in structure. Trivial mitral valve  regurgitation.   4. The aortic valve is tricuspid. Aortic valve  regurgitation is not  visualized. No aortic stenosis is present.   5. No left atrial/left atrial appendage thrombus was detected.   03/06/2021 IMPRESSION 1. Apical hypokinesis with overall preserved LV systolic function.   2. Left ventricular ejection fraction, by estimation, is 55 to 60%. The  left ventricle has normal function. The left ventricle has no regional  wall motion abnormalities. There is mild left ventricular hypertrophy.  Left ventricular diastolic parameters  were normal.   3. Right ventricular systolic function is normal. The right ventricular  size is normal. There is normal pulmonary artery systolic pressure.   4. The mitral valve is normal in structure. No evidence of mitral valve  regurgitation. No evidence of mitral stenosis.   5. The aortic valve is tricuspid. Aortic valve regurgitation is not  visualized. Mild aortic valve sclerosis is present, with no evidence of   aortic valve stenosis.   6. The inferior vena cava is normal in size with greater than 50%  respiratory variability, suggesting right atrial pressure of 3 mmHg.     03/28/2021 CLINICAL INFORMATION Sleep Study Type: Split Night CPAP   Indication for sleep study: Congestive Heart Failure, Diabetes, Hypertension, Obesity, Re-Evaluation, Snoring, Witnessed Apneas   Epworth Sleepiness Score: 12   SLEEP STUDY TECHNIQUE As per the AASM Manual for the Scoring of Sleep and Associated Events v2.3 (April 2016) with a hypopnea requiring 4% desaturations.   The channels recorded and monitored were frontal, central and occipital EEG, electrooculogram (EOG), submentalis EMG (chin), nasal and oral airflow, thoracic and abdominal wall motion, anterior tibialis EMG, snore microphone, electrocardiogram, and pulse oximetry. Continuous positive airway pressure (CPAP) was initiated when the patient met split night criteria and was titrated according to treat sleep-disordered breathing.   MEDICATIONS acetaminophen (TYLENOL) 650 MG CR tablet albuterol (VENTOLIN HFA) 108 (90 Base) MCG/ACT inhaler ALPRAZolam (XANAX) 0.25 MG tablet apixaban (ELIQUIS) 5 MG TABS tablet atorvastatin (LIPITOR) 80 MG tablet cetirizine (ZYRTEC) 10 MG tablet clopidogrel (PLAVIX) 75 MG tablet dapagliflozin propanediol (FARXIGA) 5 MG TABS table Evolocumab (REPATHA SURECLICK) 140 MG/ML SOAJ fluticasone (FLONASE) 50 MCG/ACT nasal spray ibuprofen (ADVIL) 800 MG tablet Insulin Glargine (BASAGLAR KWIKPEN) 100 UNIT/ML Insulin Glargine-yfgn (SEMGLEE, YFGN,) 100 UNIT/ML SOPN LANTUS SOLOSTAR 100 UNIT/ML Solostar Pen magnesium citrate SOLN metoprolol succinate (TOPROL-XL) 25 MG 24 hr tablet nitroGLYCERIN (NITROSTAT) 0.4 MG SL tablet pantoprazole (PROTONIX) 40 MG tablet sacubitril-valsartan (ENTRESTO) 97-103 MG spironolactone (ALDACTONE) 25 MG tablet terbinafine (LAMISIL) 250 MG tablet   Medications self-administered by patient taken  the night of the study : PANTOPRAZOLE SODIUM, ELIQUIS, ENTRESTO, TYLENOL PM, INSULIN GLARGINE   RESPIRATORY PARAMETERS Diagnostic Total AHI (/hr):            35.5     RDI (/hr):         38.7     OA Index (/hr):            0.4       CA Index (/hr):      3.2 REM AHI (/hr):            91.4     NREM AHI (/hr):          31.3     Supine AHI (/hr):         36.5     Non-supine AHI (/hr):        33.6 Min O2 Sat (%):          79.0     Mean O2 (%):  91.7  Time below 88% (min):           8           Titration Optimal Pressure (cm):           14        AHI at Optimal Pressure (/hr):            0          Min O2 at Optimal Pressure (%):       93.0 Supine % at Optimal (%):       0          Sleep % at Optimal (%):         98           SLEEP ARCHITECTURE The recording time for the entire night was 446.9 minutes.   During a baseline period of 167.4 minutes, the patient slept for 150.5 minutes in REM and nonREM, yielding a sleep efficiency of 89.9%%. Sleep onset after lights out was 2.5 minutes with a REM latency of 73.0 minutes. The patient spent 5.6%% of the night in stage N1 sleep, 87.4%% in stage N2 sleep, 0.0%% in stage N3 and 7% in REM.   During the titration period of 273.3 minutes, the patient slept for 226.5 minutes in REM and nonREM, yielding a sleep efficiency of 82.9%%. Sleep onset after CPAP initiation was 16.4 minutes with a REM latency of 41.0 minutes. The patient spent 5.1%% of the night in stage N1 sleep, 79.0%% in stage N2 sleep, 0.0%% in stage N3 and 15.9% in REM.   CARDIAC DATA The 2 lead EKG demonstrated sinus rhythm. The mean heart rate was 100.0 beats per minute. Other EKG findings include: None.   LEG MOVEMENT DATA The total Periodic Limb Movements of Sleep (PLMS) were 0. The PLMS index was 0.0 .   IMPRESSIONS - Severe obstructive sleep apnea occurred during the diagnostic portion of the study (AHI  35.5/h; RDI 38.7/h); events were very severe during REM sleep (AHI 91.4/h).  CPAP was  initiated at 6 cm and was titrated to optimal PAP pressure (14 cm of water). AHI 0 at 14 cm without REM sleep, and 0.8/h at 12 cm with minimal REM sleep. Snoring persisted until 14 cm of water. - No significant central sleep apnea occurred during the diagnostic portion of the study (CAI 3.2/hour). - Mild oxygen desaturation was noted during the diagnostic portion of the study to a nadir of 79.0%. - The patient snored with loud snoring volume during the diagnostic portion of the study. - No cardiac abnormalities were noted during this study. - Clinically significant periodic limb movements did not occur during sleep.   DIAGNOSIS - Obstructive Sleep Apnea (G47.33)   RECOMMENDATIONS - Recommend an initial trial of CPAP Auto therapy with EPR of 3 at 12 - 18 cm of water with heated humidification. A medium Wide Cushion size [employer] Minimal Catering manager Under Nose Frame (M) mask was used for the titration. - Effort should be made to optimize nasal and oropharyngeal patency. - Avoid alcohol, sedatives and other CNS depressants that may worsen sleep apnea and disrupt normal sleep architecture. - Sleep hygiene should be reviewed to assess factors that may improve sleep quality. - Weight management (BMI 42) and regular exercise should be initiated or continued. - Recommend a download in 30 days and sleep clinic evaluation after 4 weeks of therapy.  ASSESSMENT:    1. ST elevation myocardial infarction involving left anterior descending (LAD)  coronary artery [facility]): August 2021   2. Ischemic cardiomyopathy: Resolved on most recent echo May 2022   3. Essential hypertension   4. OSA (obstructive sleep apnea)   5. Hyperlipidemia with target LDL less than 55   6. Morbid obesity (HCC): improved with BMI today at 38.   7. Controlled type 2 diabetes mellitus without complication, without long-term current use of insulin (HCC)   8. Retinal detachment, unspecified laterality     PLAN:  Mr.  Osher Merritt is a 52 year old gentleman who has a history of morbid obesity, hypertension, hyperlipidemia, and previously untreated obstructive sleep apnea.  He developed severe chest pain and was awakened from sleep the morning May 30, 2020 and was found to have acute anterolateral STEMI secondary to total mid occlusion of his LAD.  He was found to have concomitant CAD and his LAD was successfully stented with a 3.0 x 22 mm Resolute Onyx DES stent postdilated to 3.25 mm with 100% occlusion being reduced to 0% and restoration of TIMI-3 flow.  He has LV dysfunction secondary to his LAD infarction with initial EF at 35 to 40% which subsequently improved several weeks later to 40 to 45%.  He had developed an episode of atrial flutter following Sudafed and ultimately was converted successfully back to sinus rhythm with DC cardioversion.  He has been on guideline directed medical therapy with his LV dysfunction.  At his office visit April 2022 he had stage II hypertension and I further titrated Entresto to 97/103 mg twice a day and added spironolactone 12.5 mg.  A subsequent echo Doppler study of May 2022 has  demonstrated normalization of LV function with EF at 55 to 60% with mild LVH and mild aortic sclerosis.  When seen in January 2023, his breathing was improved.  Blood pressure continues to be elevated and I recommended further titration of spironolactone to 12.5 mg twice a day and titration of Farxiga to 10 mg daily.  He continued to be on  metoprolol 50 mg, Entresto 97/103 mg twice a day.  He continues to be on Plavix/Eliquis for antiplatelet therapy/anticoagulation.  He is now on Repatha and atorvastatin 80 mg daily for his significant hyperlipidemia and most recent LDL cholesterol in November 2022 was 65.  At subsequent evaluation, blood pressure today continues to be elevated and I am amlodipine 5 mg was added to his medical regimen.  His blood pressure today is stable but minimally increased on his  regimen of amlodipine 5 mg, Entresto 97/103 twice a day, Farxiga 10 mg, metoprolol succinate 50 mg daily and spironolactone 12.5 mg twice a day.  His ECG shows sinus bradycardia 57 bpm with evidence for prior infarction.  He has been undergoing eye injections every 3 or 4 weeks for retinal detachment.  He has subsequently sleep reevaluation confirms severe sleep apnea with an AHI of 35.5.  However events were very severe during REM sleep with AHI of 91.4 with significant oxygen desaturation to nadir of 79%.  Previously I had a very lengthy discussion with him concerning untreated sleep apnea and its adverse cardiovascular consequences.  I reiterated these points today particularly with reference to effects on blood pressure, potential for nocturnal arrhythmias, increased risk for atrial fibrillation, increased insulin resistance, increased inflammatory markers, nocturnal GERD, and the potential for nocturnal hypoxemia contributing to ischemia both in the cardiac as well as cerebrovascular circulation.  He has not used CPAP over the last 2 months.  He had been sleeping upright in a recliner.  However  he is now back in bed.  Last usage was on August 24.  He has a new ResMed AirSense 11 AutoSet unit with pressure range of 12 to 18 cm.  I again discussed optimal sleep duration with CPAP at 7 at 8 hours if at all possible.  Previously he was only using treatment for 2 hours and 14 minutes.  I am providing him with a new ResMed F30 mask today.  I strongly encouraged improved compliance.  He sees Dr. Renford Dills for primary care.  I have recommended follow-up cardiology and sleep evaluation in 4 months.    Medication Adjustments/Labs and Tests Ordered: Current medicines are reviewed at length with the patient today.  Concerns regarding medicines are outlined above.  Medication changes, Labs and Tests ordered today are listed in the Patient Instructions below. Patient Instructions  Medication Instructions:  No  medication changes *If you need a refill on your cardiac medications before your next appointment, please call your pharmacy*   Lab Work: No labs ordered If you have labs (blood work) drawn today and your tests are completely normal, you will receive your results only by: MyChart Message (if you have MyChart) OR A paper copy in the mail If you have any lab test that is abnormal or we need to change your treatment, we will call you to review the results.   Testing/Procedures: No testing ordered   Follow-Up: At [facility], you and your health needs are our priority.  As part of our continuing mission to provide you with exceptional heart care, we have created designated Provider Care Teams.  These Care Teams include your primary Cardiologist (physician) and Advanced Practice Providers (APPs -  Physician Assistants and Nurse Practitioners) who all work together to provide you with the care you need, when you need it.  We recommend signing up for the patient portal called "MyChart".  Sign up information is provided on this After Visit Summary.  MyChart is used to connect with patients for Virtual Visits (Telemedicine).  Patients are able to view lab/test results, encounter notes, upcoming appointments, etc.  Non-urgent messages can be sent to your provider as well.   To learn more about what you can do with MyChart, go to [email or web address].    Your next appointment:   4 month(s)  Provider:   Nicki Guadalajara, MD     Other Instructions If you have any questions or concerns regarding your c-pap, bi-pap or sleep accessories, please contact Brandie Rorie at [phone].       Signed, Nicki Guadalajara, MD, [facility], ABSM Diplomate, American Board of Sleep Medicine  09/21/2023 5:37 PM    [facility] [address], [place], [place]  [address] Phone: [phone]

## 2023-09-21 ENCOUNTER — Encounter: Payer: Self-pay | Admitting: Cardiovascular Disease

## 2023-10-01 ENCOUNTER — Other Ambulatory Visit (HOSPITAL_COMMUNITY): Payer: Self-pay

## 2023-10-01 DIAGNOSIS — M25511 Pain in right shoulder: Secondary | ICD-10-CM | POA: Diagnosis not present

## 2023-10-01 MED ORDER — OZEMPIC (0.25 OR 0.5 MG/DOSE) 2 MG/3ML ~~LOC~~ SOPN
0.5000 mg | PEN_INJECTOR | SUBCUTANEOUS | 11 refills | Status: DC
  Filled 2023-10-01: qty 3, 28d supply, fill #0
  Filled 2023-10-29: qty 3, 28d supply, fill #1
  Filled 2023-12-24: qty 3, 28d supply, fill #2
  Filled 2024-01-14 – 2024-01-16 (×2): qty 3, 28d supply, fill #3
  Filled 2024-02-04 – 2024-02-08 (×2): qty 3, 28d supply, fill #4
  Filled 2024-03-11: qty 3, 28d supply, fill #5

## 2023-10-08 ENCOUNTER — Other Ambulatory Visit (HOSPITAL_COMMUNITY): Payer: Self-pay

## 2023-10-15 ENCOUNTER — Other Ambulatory Visit: Payer: Self-pay

## 2023-10-15 ENCOUNTER — Other Ambulatory Visit (HOSPITAL_COMMUNITY): Payer: Self-pay

## 2023-10-29 ENCOUNTER — Other Ambulatory Visit (HOSPITAL_COMMUNITY): Payer: Self-pay

## 2023-11-12 ENCOUNTER — Other Ambulatory Visit (HOSPITAL_COMMUNITY): Payer: Self-pay

## 2023-11-13 ENCOUNTER — Other Ambulatory Visit (HOSPITAL_COMMUNITY): Payer: Self-pay

## 2023-11-13 ENCOUNTER — Other Ambulatory Visit: Payer: Self-pay

## 2023-11-13 MED ORDER — PANTOPRAZOLE SODIUM 40 MG PO TBEC
40.0000 mg | DELAYED_RELEASE_TABLET | Freq: Two times a day (BID) | ORAL | 2 refills | Status: DC
  Filled 2023-11-13: qty 180, 90d supply, fill #0
  Filled 2024-02-04: qty 180, 90d supply, fill #1
  Filled 2024-04-16: qty 180, 90d supply, fill #2

## 2023-11-15 ENCOUNTER — Other Ambulatory Visit: Payer: Self-pay | Admitting: Orthopedic Surgery

## 2023-11-15 DIAGNOSIS — M25511 Pain in right shoulder: Secondary | ICD-10-CM

## 2023-11-19 DIAGNOSIS — E113512 Type 2 diabetes mellitus with proliferative diabetic retinopathy with macular edema, left eye: Secondary | ICD-10-CM | POA: Diagnosis not present

## 2023-11-26 ENCOUNTER — Encounter: Payer: Self-pay | Admitting: Orthopedic Surgery

## 2023-11-27 ENCOUNTER — Other Ambulatory Visit (HOSPITAL_COMMUNITY): Payer: Self-pay

## 2023-11-29 ENCOUNTER — Ambulatory Visit
Admission: RE | Admit: 2023-11-29 | Discharge: 2023-11-29 | Disposition: A | Discharge status: Home / Self Care | Payer: 59 | Source: Ambulatory Visit | Attending: Orthopedic Surgery | Admitting: Orthopedic Surgery

## 2023-11-29 DIAGNOSIS — M25511 Pain in right shoulder: Secondary | ICD-10-CM | POA: Diagnosis not present

## 2023-11-29 MED ORDER — IOPAMIDOL (ISOVUE-M 200) INJECTION 41%
10.0000 mL | Freq: Once | INTRAMUSCULAR | Status: AC
  Administered 2023-11-29: 10 mL via INTRA_ARTICULAR

## 2023-12-10 ENCOUNTER — Other Ambulatory Visit (HOSPITAL_COMMUNITY): Payer: Self-pay

## 2023-12-10 ENCOUNTER — Other Ambulatory Visit: Payer: Self-pay | Admitting: Nurse Practitioner

## 2023-12-10 DIAGNOSIS — S46011D Strain of muscle(s) and tendon(s) of the rotator cuff of right shoulder, subsequent encounter: Secondary | ICD-10-CM | POA: Diagnosis not present

## 2023-12-11 ENCOUNTER — Other Ambulatory Visit (HOSPITAL_COMMUNITY): Payer: Self-pay

## 2023-12-12 ENCOUNTER — Other Ambulatory Visit: Payer: Self-pay | Admitting: Cardiovascular Disease

## 2023-12-12 ENCOUNTER — Other Ambulatory Visit (HOSPITAL_COMMUNITY): Payer: Self-pay

## 2023-12-12 ENCOUNTER — Other Ambulatory Visit (HOSPITAL_BASED_OUTPATIENT_CLINIC_OR_DEPARTMENT_OTHER): Payer: Self-pay

## 2023-12-12 MED ORDER — DAPAGLIFLOZIN PROPANEDIOL 10 MG PO TABS
10.0000 mg | ORAL_TABLET | Freq: Every day | ORAL | 11 refills | Status: AC
  Filled 2023-12-12: qty 30, 30d supply, fill #0
  Filled 2024-01-14: qty 30, 30d supply, fill #1
  Filled 2024-02-11: qty 30, 30d supply, fill #2
  Filled 2024-03-11: qty 30, 30d supply, fill #3
  Filled 2024-04-10: qty 30, 30d supply, fill #4
  Filled 2024-04-17 – 2024-05-06 (×2): qty 30, 30d supply, fill #5
  Filled 2024-06-04: qty 30, 30d supply, fill #6
  Filled 2024-07-04: qty 30, 30d supply, fill #7
  Filled 2024-07-30: qty 30, 30d supply, fill #8
  Filled 2024-08-28: qty 30, 30d supply, fill #9
  Filled 2024-10-01: qty 30, 30d supply, fill #0
  Filled 2024-10-01: qty 30, 30d supply, fill #10
  Filled 2024-10-27: qty 30, 30d supply, fill #1

## 2023-12-13 ENCOUNTER — Other Ambulatory Visit: Payer: Self-pay | Admitting: Nurse Practitioner

## 2023-12-13 ENCOUNTER — Other Ambulatory Visit (HOSPITAL_COMMUNITY): Payer: Self-pay

## 2023-12-13 MED ORDER — SPIRONOLACTONE 25 MG PO TABS
12.5000 mg | ORAL_TABLET | Freq: Two times a day (BID) | ORAL | 9 refills | Status: DC
  Filled 2023-12-13: qty 30, 30d supply, fill #0
  Filled 2024-01-14: qty 30, 30d supply, fill #1
  Filled 2024-02-22: qty 30, 30d supply, fill #2
  Filled 2024-03-18: qty 30, 30d supply, fill #3
  Filled 2024-04-17 – 2024-04-18 (×2): qty 30, 30d supply, fill #4
  Filled 2024-05-20: qty 30, 30d supply, fill #5
  Filled 2024-06-16: qty 30, 30d supply, fill #6
  Filled 2024-07-08 – 2024-07-14 (×2): qty 30, 30d supply, fill #7
  Filled 2024-08-13: qty 30, 30d supply, fill #8
  Filled 2024-09-25: qty 30, 30d supply, fill #9

## 2023-12-14 ENCOUNTER — Telehealth: Payer: Self-pay | Admitting: Cardiovascular Disease

## 2023-12-14 ENCOUNTER — Telehealth: Payer: Self-pay

## 2023-12-14 DIAGNOSIS — Z01818 Encounter for other preprocedural examination: Secondary | ICD-10-CM

## 2023-12-14 DIAGNOSIS — S46011D Strain of muscle(s) and tendon(s) of the rotator cuff of right shoulder, subsequent encounter: Secondary | ICD-10-CM | POA: Diagnosis not present

## 2023-12-14 NOTE — Telephone Encounter (Signed)
Pharmacy please advise on holding Eliquis prior to right shoulder rotator cuff repair scheduled for TBD.   Thank you! Samara Deist

## 2023-12-14 NOTE — Telephone Encounter (Signed)
Called patient to set up an Tele pre-op clearance appt on 12/27/23 @ 9:20, Meds rec and consent done.      Patient Consent for Virtual Visit        [name] Merritt has provided verbal consent on 12/14/2023 for a virtual visit (video or telephone).   CONSENT FOR VIRTUAL VISIT FOR:  [name] Merritt  By participating in this virtual visit I agree to the following:  I hereby voluntarily request, consent and authorize [facility] and its employed or contracted physicians, physician assistants, nurse practitioners or other licensed health care professionals (the Practitioner), to provide me with telemedicine health care services (the "Services") as deemed necessary by the treating Practitioner. I acknowledge and consent to receive the Services by the Practitioner via telemedicine. I understand that the telemedicine visit will involve communicating with the Practitioner through live audiovisual communication technology and the disclosure of certain medical information by electronic transmission. I acknowledge that I have been given the opportunity to request an in-person assessment or other available alternative prior to the telemedicine visit and am voluntarily participating in the telemedicine visit.  I understand that I have the right to withhold or withdraw my consent to the use of telemedicine in the course of my care at any time, without affecting my right to future care or treatment, and that the Practitioner or I may terminate the telemedicine visit at any time. I understand that I have the right to inspect all information obtained and/or recorded in the course of the telemedicine visit and may receive copies of available information for a reasonable fee.  I understand that some of the potential risks of receiving the Services via telemedicine include:  Delay or interruption in medical evaluation due to technological equipment failure or disruption; Information transmitted may not be  sufficient (e.g. poor resolution of images) to allow for appropriate medical decision making by the Practitioner; and/or  In rare instances, security protocols could fail, causing a breach of personal health information.  Furthermore, I acknowledge that it is my responsibility to provide information about my medical history, conditions and care that is complete and accurate to the best of my ability. I acknowledge that Practitioner's advice, recommendations, and/or decision may be based on factors not within their control, such as incomplete or inaccurate data provided by me or distortions of diagnostic images or specimens that may result from electronic transmissions. I understand that the practice of medicine is not an exact science and that Practitioner makes no warranties or guarantees regarding treatment outcomes. I acknowledge that a copy of this consent can be made available to me via my patient portal [facility] MyChart), or I can request a printed copy by calling the office of [facility].    I understand that my insurance will be billed for this visit.   I have read or had this consent read to me. I understand the contents of this consent, which adequately explains the benefits and risks of the Services being provided via telemedicine.  I have been provided ample opportunity to ask questions regarding this consent and the Services and have had my questions answered to my satisfaction. I give my informed consent for the services to be provided through the use of telemedicine in my medical care

## 2023-12-14 NOTE — Telephone Encounter (Signed)
   Name: [name] Merritt  DOB: Jan 17, 1971  MRN: [id number]  Primary Cardiologist: Nicki Guadalajara, MD  Chart reviewed as part of pre-operative protocol coverage. Because of [name] Merritt's past medical history and time since last visit, he will require a follow-up telephone visit in order to better assess preoperative cardiovascular risk.  Pre-op covering staff: - Please schedule appointment and call patient to inform them. If patient already had an upcoming appointment within acceptable timeframe, please add "pre-op clearance" to the appointment notes so provider is aware. - Please contact requesting surgeon's office via preferred method (i.e, phone, fax) to inform them of need for appointment prior to surgery  Per pharmD will need labs before holding recs can be made for Eliquis.  Sharlene Dory, PA-C  12/14/2023, 2:20 PM

## 2023-12-14 NOTE — Telephone Encounter (Signed)
Called patient to set up an Tele pre-op clearance appt on 12/27/23 @ 9:20, Meds rec and consent done.

## 2023-12-14 NOTE — Telephone Encounter (Signed)
   Pre-operative Risk Assessment    Patient Name: [name] Merritt  DOB: Jun 29, 1971 MRN: [id number]   Date of last office visit: 09/12/2023 Date of next office visit: none   Request for Surgical Clearance    Procedure:   rightshoulder scope rotator cuff repair  Date of Surgery:  Clearance TBD                                Surgeon:  Dr. Ramond Marrow Surgeon's Group or Practice Name:  [facility] Phone number:  [phone] [facility] Fax number:  [phone]   Type of Clearance Requested:   - Medical  - Pharmacy:  Hold Clopidogrel (Plavix) and Apixaban (Eliquis) instructions to hold   Type of Anesthesia:  General  with interscalene block   Additional requests/questions:    Sharen Hones   12/14/2023, 12:29 PM

## 2023-12-14 NOTE — Telephone Encounter (Signed)
Patient with diagnosis of atrial fibrillation on Eliquis for anticoagulation.    Procedure:   rightshoulder scope rotator cuff repair   Date of Surgery:  Clearance TBD   CHA2DS2-VASc Score = 4   This indicates a 4.8% annual risk of stroke. The patient's score is based upon: CHF History: 1 HTN History: 1 Diabetes History: 1 Stroke History: 0 Vascular Disease History: 1 Age Score: 0 Gender Score: 0     No labs since 2023.  Please get BMET and CBC for authorization   **This guidance is not considered finalized until pre-operative APP has relayed final recommendations.**

## 2023-12-17 ENCOUNTER — Other Ambulatory Visit (HOSPITAL_COMMUNITY): Payer: Self-pay

## 2023-12-19 DIAGNOSIS — E113511 Type 2 diabetes mellitus with proliferative diabetic retinopathy with macular edema, right eye: Secondary | ICD-10-CM | POA: Diagnosis not present

## 2023-12-24 ENCOUNTER — Other Ambulatory Visit (HOSPITAL_COMMUNITY): Payer: Self-pay

## 2023-12-26 NOTE — Telephone Encounter (Addendum)
 I called the pt and informed him per preop APP he was supposed to have labs done for preop clearance, though this was not done. I did not see in the notes from CMA that she had informed the pt that he needed labs and tele.   Pt was not aware needing labs. Pt agreeable to labs and move tele preop appt. Pt will have labs done tomorrow are [facility]. Tele apppt has been moved to 01/01/24 ok per Robin Searing, NP to add pt on to provider slot as we forgot to tell the pt he needed labs.   Lab orders are placed and released to [facility].   I will update all parties involved.

## 2023-12-26 NOTE — Addendum Note (Signed)
 Addended by: Tarri Fuller on: 12/26/2023 02:51 PM   Modules accepted: Orders

## 2023-12-26 NOTE — Telephone Encounter (Signed)
    View All  12/26/2023 - Orders: You  12/26/2023 - Cosign - Clinic Orders: You and Rosalee Kaufman, RPH-CPP  Patient Communications All Conversations: Telehealth Consent (Newest Message First)December 26, 2023 Me     12/26/23  2:51 PM Note Addended by: Tarri Fuller on: 12/26/2023 02:51 PM     Modules accepted: Orders           12/26/23  2:50 PM You routed this conversation to Order Transmittal Error Pool        12/26/23  2:50 PM You routed this conversation to Rosalee Kaufman, RPH-CPP   Me     12/26/23  2:49 PM Note I called the pt and informed him per preop APP he was supposed to have labs done for preop clearance, though this was not done. I did not see in the notes from CMA that she had informed the pt that he needed labs and tele.    Pt was not aware needing labs. Pt agreeable to labs and move tele preop appt. Pt will have labs done tomorrow are [facility] Tele apppt has been moved to 01/01/24 ok per Robin Searing, NP to add pt on to provider slot as we forgot to tell the pt he needed labs.    Lab orders are placed and released to [facility].    I will update all parties involved.

## 2023-12-26 NOTE — Progress Notes (Signed)
 error

## 2023-12-27 ENCOUNTER — Ambulatory Visit: Payer: 59

## 2023-12-27 DIAGNOSIS — E113512 Type 2 diabetes mellitus with proliferative diabetic retinopathy with macular edema, left eye: Secondary | ICD-10-CM | POA: Diagnosis not present

## 2023-12-28 DIAGNOSIS — Z01818 Encounter for other preprocedural examination: Secondary | ICD-10-CM | POA: Diagnosis not present

## 2023-12-29 LAB — BASIC METABOLIC PANEL
BUN/Creatinine Ratio: 14 (ref 9–20)
BUN: 15 mg/dL (ref 6–24)
CO2: 22 mmol/L (ref 20–29)
Calcium: 9.7 mg/dL (ref 8.7–10.2)
Chloride: 104 mmol/L (ref 96–106)
Creatinine, Ser: 1.08 mg/dL (ref 0.76–1.27)
Glucose: 82 mg/dL (ref 70–99)
Potassium: 4.5 mmol/L (ref 3.5–5.2)
Sodium: 142 mmol/L (ref 134–144)
eGFR: 83 mL/min/{1.73_m2} (ref >59)

## 2023-12-29 LAB — CBC
Hematocrit: 45.1 % (ref 37.5–51.0)
Hemoglobin: 14.7 g/dL (ref 13.0–17.7)
MCH: 31.8 pg (ref 26.6–33.0)
MCHC: 32.6 g/dL (ref 31.5–35.7)
MCV: 98 fL — ABNORMAL HIGH (ref 79–97)
Platelets: 237 10*3/uL (ref 150–450)
RBC: 4.62 x10E6/uL (ref 4.14–5.80)
RDW: 11.9 % (ref 11.6–15.4)
WBC: 5 10*3/uL (ref 3.4–10.8)

## 2024-01-01 ENCOUNTER — Ambulatory Visit: Attending: Physician Assistant | Admitting: Physician Assistant

## 2024-01-01 DIAGNOSIS — Z01810 Encounter for preprocedural cardiovascular examination: Secondary | ICD-10-CM | POA: Diagnosis not present

## 2024-01-01 NOTE — Progress Notes (Signed)
 Virtual Visit via Telephone Note   Because of [name] Merritt co-morbid illnesses, he is at least at moderate risk for complications without adequate follow up.  This format is felt to be most appropriate for this patient at this time.  Due to technical limitations with video connection (technology), today's appointment will be conducted as an audio only telehealth visit, and [name] Merritt verbally agreed to proceed in this manner.   All issues noted in this document were discussed and addressed.  No physical exam could be performed with this format.  Evaluation Performed:  Preoperative cardiovascular risk assessment _____________   Date:  01/01/2024   Patient ID:  [name] Merritt, DOB 1971-04-19, MRN [id number] Patient Location:  Home Provider location:   Office  Primary Care Provider:  Renford Dills, MD Primary Cardiologist:  Nicki Guadalajara, MD  Chief Complaint / Patient Profile   53 y.o. y/o male with a h/o  Coronary artery disease Anterolateral STEMI 05/2020 s/p 3 x 22 mm DES to the mid LAD [facility] 05/30/2020 with residual CAD: D1 70, RI 20, OM3 50, AV groove LCx 30, mid PLA 70 HFimpEF (heart failure with improved ejection fraction)  Ischemic cardiomyopathy EF was 35-40 >> Normal TTE 03/08/2021: Apical HK, EF 55-60, mild LVH, AV sclerosis Atrial flutter s/p TEE DCCV in 05/2020  Hypertension Hyperlipidemia Diabetes mellitus   who is pending right shoulder arthroscopy and rotator cuff repair with Dr. Everardo Pacific under general anesthesia with interscalene block and presents today for telephonic preoperative cardiovascular risk assessment.  Request has been to hold Eliquis and Plavix.  History was reviewed by our Pharm.D. Per office protocol, patient can hold Eliquis for 3 days prior to procedure.  Per protocol, the patient can hold Plavix for 5 days prior to his procedure.  We recommend taking aspirin 81 mg daily while off of Plavix and stopping it when Plavix is resumed.  History of  Present Illness    [name] Merritt is a 53 y.o. male who presents via Web designer for a telehealth visit today.  Pt was last seen in cardiology clinic on 09/12/2023 by Dr. Tresa Endo.  At that time [name] was doing well .  The patient is now pending procedure as outlined above. Since his last visit, he has done well without chest pain, shortness of breath or syncope.  Physical Exam    Vital Signs:  [name] does not have vital signs available for review today.   Given telephonic nature of communication, physical exam is limited. AAOx3. NAD. Normal affect.  Speech and respirations are unlabored.  Accessory Clinical Findings    None  Assessment & Plan    Assessment & Plan Preoperative cardiovascular examination [name]'s perioperative risk of a major cardiac event is elevated at 6.6% according to the Revised Cardiac Risk Index (RCRI).  However, his functional capacity is good at 4.31 METs according to the Duke Activity Status Index (DASI). Recommendations: According to ACC/AHA guidelines, no further cardiovascular testing needed.  The patient may proceed to surgery at acceptable risk.   Antiplatelet and/or Anticoagulation Recommendations: -Clopidogrel (Plavix) can be held for 5 days prior to his surgery and resumed as soon as possible post op. -Ideally, we prefer the patient take aspirin 81 mg daily while he is off of Plavix.  This can be stopped when he resumes Plavix postoperatively.  If the bleeding risk is too great, he can stop Plavix 5 days before without starting on aspirin. -Eliquis (Apixaban) can be held for 3 days prior to surgery.  Please resume post op when felt to be safe.    The patient was advised that if he develops new symptoms prior to surgery to contact our office to arrange for a follow-up visit, and he verbalized understanding.  A copy of this note will be routed to requesting surgeon.  Time:   Today, I have spent 7 minutes with the  patient with telehealth technology discussing medical history, symptoms, and management plan.     [name], PA-C 01/01/2024, 3:12 PM

## 2024-01-01 NOTE — Telephone Encounter (Signed)
 Patient with diagnosis of afib on Eliquis for anticoagulation.    Procedure:  rightshoulder scope rotator cuff repair  Date of procedure: TBD   CHA2DS2-VASc Score = 4   This indicates a 4.8% annual risk of stroke. The patient's score is based upon: CHF History: 1 HTN History: 1 Diabetes History: 1 Stroke History: 0 Vascular Disease History: 1 Age Score: 0 Gender Score: 0     CrCl 150 mL/min Platelet count 237 K    Per office protocol, patient can hold Eliquis for 3 days prior to procedure.     **This guidance is not considered finalized until pre-operative APP has relayed final recommendations.**

## 2024-01-01 NOTE — Telephone Encounter (Signed)
 Notes faxed to surgeon.  [name], PA-C  01/01/2024 3:17 PM

## 2024-01-14 ENCOUNTER — Other Ambulatory Visit: Payer: Self-pay

## 2024-01-14 ENCOUNTER — Other Ambulatory Visit (HOSPITAL_COMMUNITY): Payer: Self-pay

## 2024-01-18 ENCOUNTER — Other Ambulatory Visit (HOSPITAL_COMMUNITY): Payer: Self-pay

## 2024-01-22 ENCOUNTER — Other Ambulatory Visit (HOSPITAL_COMMUNITY): Payer: Self-pay

## 2024-01-22 MED ORDER — TADALAFIL 20 MG PO TABS
20.0000 mg | ORAL_TABLET | Freq: Every day | ORAL | 11 refills | Status: AC
  Filled 2024-01-22: qty 30, 30d supply, fill #0
  Filled 2024-03-18: qty 30, 30d supply, fill #1
  Filled 2024-04-14: qty 6, 30d supply, fill #2
  Filled 2024-04-14: qty 30, 30d supply, fill #2
  Filled 2024-05-12: qty 6, 30d supply, fill #3
  Filled 2024-06-09: qty 6, 30d supply, fill #4
  Filled 2024-07-08: qty 6, 30d supply, fill #5
  Filled 2024-08-05: qty 6, 30d supply, fill #6
  Filled 2024-09-02: qty 6, 30d supply, fill #7
  Filled 2024-10-01: qty 6, 30d supply, fill #8
  Filled 2024-10-29: qty 6, 30d supply, fill #9
  Filled 2024-10-29: qty 6, 30d supply, fill #0

## 2024-01-22 MED ORDER — SILDENAFIL CITRATE 20 MG PO TABS
20.0000 mg | ORAL_TABLET | Freq: Every day | ORAL | 11 refills | Status: DC | PRN
  Filled 2024-01-22: qty 30, 6d supply, fill #0

## 2024-01-31 DIAGNOSIS — E113511 Type 2 diabetes mellitus with proliferative diabetic retinopathy with macular edema, right eye: Secondary | ICD-10-CM | POA: Diagnosis not present

## 2024-02-04 ENCOUNTER — Other Ambulatory Visit (HOSPITAL_COMMUNITY): Payer: Self-pay

## 2024-02-04 ENCOUNTER — Other Ambulatory Visit: Payer: Self-pay

## 2024-02-04 MED ORDER — METOPROLOL SUCCINATE ER 25 MG PO TB24
50.0000 mg | ORAL_TABLET | Freq: Every day | ORAL | 3 refills | Status: AC
  Filled 2024-02-04: qty 180, 90d supply, fill #0
  Filled 2024-04-16: qty 180, 90d supply, fill #1
  Filled 2024-08-05: qty 180, 90d supply, fill #2
  Filled 2024-10-29: qty 180, 90d supply, fill #0
  Filled 2024-10-29: qty 180, 90d supply, fill #3

## 2024-02-07 ENCOUNTER — Encounter (HOSPITAL_BASED_OUTPATIENT_CLINIC_OR_DEPARTMENT_OTHER): Payer: Self-pay | Admitting: Orthopaedic Surgery

## 2024-02-07 ENCOUNTER — Other Ambulatory Visit: Payer: Self-pay

## 2024-02-11 ENCOUNTER — Other Ambulatory Visit (HOSPITAL_COMMUNITY): Payer: Self-pay

## 2024-02-12 ENCOUNTER — Encounter (HOSPITAL_BASED_OUTPATIENT_CLINIC_OR_DEPARTMENT_OTHER)
Admission: RE | Admit: 2024-02-12 | Discharge: 2024-02-12 | Disposition: A | Discharge status: Home / Self Care | Source: Ambulatory Visit | Attending: Orthopaedic Surgery | Admitting: Orthopaedic Surgery

## 2024-02-12 ENCOUNTER — Other Ambulatory Visit (HOSPITAL_COMMUNITY): Payer: Self-pay

## 2024-02-12 DIAGNOSIS — E119 Type 2 diabetes mellitus without complications: Secondary | ICD-10-CM | POA: Insufficient documentation

## 2024-02-12 DIAGNOSIS — Z01818 Encounter for other preprocedural examination: Secondary | ICD-10-CM | POA: Diagnosis present

## 2024-02-12 DIAGNOSIS — Z01812 Encounter for preprocedural laboratory examination: Secondary | ICD-10-CM | POA: Insufficient documentation

## 2024-02-12 LAB — BASIC METABOLIC PANEL WITH GFR
Anion gap: 8 (ref 5–15)
BUN: 11 mg/dL (ref 6–20)
CO2: 25 mmol/L (ref 22–32)
Calcium: 8.9 mg/dL (ref 8.9–10.3)
Chloride: 106 mmol/L (ref 98–111)
Creatinine, Ser: 1.08 mg/dL (ref 0.61–1.24)
GFR, Estimated: >60 mL/min (ref >60)
Glucose, Bld: 106 mg/dL — ABNORMAL HIGH (ref 70–99)
Potassium: 4.5 mmol/L (ref 3.5–5.1)
Sodium: 139 mmol/L (ref 135–145)

## 2024-02-12 NOTE — Progress Notes (Signed)
 Surgical soap given with instructions, pt verbalized understanding. Benzoyl peroxide gel given with instructions, pt verbalized understanding.

## 2024-02-13 DIAGNOSIS — E113512 Type 2 diabetes mellitus with proliferative diabetic retinopathy with macular edema, left eye: Secondary | ICD-10-CM | POA: Diagnosis not present

## 2024-02-13 NOTE — Discharge Instructions (Signed)
 Grafton Lawrence MD, MPH Nicholas Bari, PA-C [facility] [address] [phone] (tel)   [phone] (fax)   POST-OPERATIVE INSTRUCTIONS - SHOULDER ARTHROSCOPY  WOUND CARE You may remove the Operative Dressing on Post-Op Day #3 (72hrs after surgery).   Alternatively if you would like you can leave dressing on until follow-up if within 7-8 days but keep it dry. Leave steri-strips in place until they fall off on their own, usually 2 weeks postop. There may be a small amount of fluid/bleeding leaking at the surgical site.  This is normal; the shoulder is filled with fluid during the procedure and can leak for 24-48hrs after surgery.  You may change/reinforce the bandage as needed.  Use the Cryocuff or Ice as often as possible for the first 7 days, then as needed for pain relief. Always keep a towel, ACE wrap or other barrier between the cooling unit and your skin.  You may shower on Post-Op Day #3. Gently pat the area dry.  Do not soak the shoulder in water or submerge it.  Keep incisions as dry as possible. Do not go swimming in the pool or ocean until 4 weeks after surgery or when otherwise instructed.    EXERCISES Wear the sling at all times  You may remove the sling for showering, but keep the arm across the chest or in a secondary sling.     It is normal for your fingers/hand to become more swollen after surgery and discolored from bruising.   This will resolve over the first few weeks usually after surgery. Please continue to ambulate and do not stay sitting or lying for too long.  Perform foot and wrist pumps to assist in circulation.  PHYSICAL THERAPY - no therapy for 4 weeks after surgery  REGIONAL ANESTHESIA (NERVE BLOCKS) The anesthesia team may have performed a nerve block for you this is a great tool used to minimize pain.   The block may start wearing off overnight (between 8-24 hours postop) When the block wears off, your pain may go  from nearly zero to the pain you would have had postop without the block. This is an abrupt transition but nothing dangerous is happening.   This can be a challenging period but utilize your as needed pain medications to try and manage this period. We suggest you use the pain medication the first night prior to going to bed, to ease this transition.  You may take an extra dose of narcotic when this happens if needed  POST-OP MEDICATIONS- Multimodal approach to pain control Next tylenol  dose after 12:44pm In general your pain will be controlled with a combination of substances.  Prescriptions unless otherwise discussed are electronically sent to your pharmacy.  This is a carefully made plan we use to minimize narcotic use.     Gabapentin  - this is to help with nerve based pain, taken on a scheduled basis Acetaminophen  - Non-narcotic pain medicine taken on a scheduled basis  Oxycodone  - This is a strong narcotic, to be used only on an "as needed" basis for SEVERE pain. Zofran  - take as needed for nausea  You may resume your blood thinner 24 hours after surgery   FOLLOW-UP If you develop a Fever (>=101.5), Redness or Drainage from the surgical incision site, please call our office to arrange for an evaluation. Please call the office to schedule a follow-up appointment for your first post-operative appointment, 7-10 days post-operatively.    HELPFUL INFORMATION   You  may be more comfortable sleeping in a semi-seated position the first few nights following surgery.  Keep a pillow propped under the elbow and forearm for comfort.  If you have a recliner type of chair it might be beneficial.  If not that is fine too, but it would be helpful to sleep propped up with pillows behind your operated shoulder as well under your elbow and forearm.  This will reduce pulling on the suture lines.  When dressing, put your operative arm in the sleeve first.  When getting undressed, take your operative arm out  last.  Loose fitting, button-down shirts are recommended.  Often in the first days after surgery you may be more comfortable keeping your operative arm under your shirt and not through the sleeve.  You may return to work/school in the next couple of days when you feel up to it.  Desk work and typing in the sling is fine.  We suggest you use the pain medication the first night prior to going to bed, in order to ease any pain when the anesthesia wears off. You should avoid taking pain medications on an empty stomach as it will make you nauseous.  You should wean off your narcotic medicines as soon as you are able.  Most patients will be off narcotics before their first postop appointment.   Do not drink alcoholic beverages or take illicit drugs when taking pain medications.  It is against the law to drive while taking narcotics.  In some states it is against the law to drive while your arm is in a sling.   Pain medication may make you constipated.  Below are a few solutions to try in this order: Decrease the amount of pain medication if you aren't having pain. Drink lots of decaffeinated fluids. Drink prune juice and/or eat dried prunes  If the first 3 don't work start with additional solutions Take Colace - an over-the-counter stool softener Take Senokot - an over-the-counter laxative Take Miralax - a stronger over-the-counter laxative  For more information including helpful videos and documents visit our website:   [email or web address]    Post Anesthesia Home Care Instructions  Activity: Get plenty of rest for the remainder of the day. A responsible individual must stay with you for 24 hours following the procedure.  For the next 24 hours, DO NOT: -Drive a car -Advertising copywriter -Drink alcoholic beverages -Take any medication unless instructed by your physician -Make any legal decisions or sign important papers.  Meals: Start with liquid foods  such as gelatin or soup. Progress to regular foods as tolerated. Avoid greasy, spicy, heavy foods. If nausea and/or vomiting occur, drink only clear liquids until the nausea and/or vomiting subsides. Call your physician if vomiting continues.  Special Instructions/Symptoms: Your throat may feel dry or sore from the anesthesia or the breathing tube placed in your throat during surgery. If this causes discomfort, gargle with warm salt water. The discomfort should disappear within 24 hours.  If you had a scopolamine patch placed behind your ear for the management of post- operative nausea and/or vomiting:  1. The medication in the patch is effective for 72 hours, after which it should be removed.  Wrap patch in a tissue and discard in the trash. Wash hands thoroughly with soap and water. 2. You may remove the patch earlier than 72 hours if you experience unpleasant side effects which may include dry mouth, dizziness or visual disturbances. 3. Avoid touching the patch. Wash your hands with soap  and water after contact with the patch.   Regional Anesthesia Blocks  1. You may not be able to move or feel the "blocked" extremity after a regional anesthetic block. This may last may last from 3-48 hours after placement, but it will go away. The length of time depends on the medication injected and your individual response to the medication. As the nerves start to wake up, you may experience tingling as the movement and feeling returns to your extremity. If the numbness and inability to move your extremity has not gone away after 48 hours, please call your surgeon.   2. The extremity that is blocked will need to be protected until the numbness is gone and the strength has returned. Because you cannot feel it, you will need to take extra care to avoid injury. Because it may be weak, you may have difficulty moving it or using it. You may not know what position it is in without looking at it while the block is in  effect.  3. For blocks in the legs and feet, returning to weight bearing and walking needs to be done carefully. You will need to wait until the numbness is entirely gone and the strength has returned. You should be able to move your leg and foot normally before you try and bear weight or walk. You will need someone to be with you when you first try to ensure you do not fall and possibly risk injury.  4. Bruising and tenderness at the needle site are common side effects and will resolve in a few days.  5. Persistent numbness or new problems with movement should be communicated to the surgeon or the [facility] [phone] [facility] [phone]).Regional Anesthesia Blocks  1. You may not be able to move or feel the "blocked" extremity after a regional anesthetic block. This may last may last from 3-48 hours after placement, but it will go away. The length of time depends on the medication injected and your individual response to the medication. As the nerves start to wake up, you may experience tingling as the movement and feeling returns to your extremity. If the numbness and inability to move your extremity has not gone away after 48 hours, please call your surgeon.   2. The extremity that is blocked will need to be protected until the numbness is gone and the strength has returned. Because you cannot feel it, you will need to take extra care to avoid injury. Because it may be weak, you may have difficulty moving it or using it. You may not know what position it is in without looking at it while the block is in effect.  3. For blocks in the legs and feet, returning to weight bearing and walking needs to be done carefully. You will need to wait until the numbness is entirely gone and the strength has returned. You should be able to move your leg and foot normally before you try and bear weight or walk. You will need someone to be with you when you first try to ensure you  do not fall and possibly risk injury.  4. Bruising and tenderness at the needle site are common side effects and will resolve in a few days.  5. Persistent numbness or new problems with movement should be communicated to the surgeon or the [facility] [phone] [facility] [phone]).

## 2024-02-13 NOTE — H&P (Cosign Needed Addendum)
 PREOPERATIVE H&P  Chief Complaint: right shoulder cartilage disorder, impingement syndrome, rotator cuff tear, biceps tendinitis.  HPI: [name] Merritt is a 53 y.o. male who is scheduled for, Procedure(s): ARTHROSCOPY, SHOULDER WITH DEBRIDEMENT DECOMPRESSION, SUBACROMIAL SPACE TENODESIS, BICEPS ARTHROSCOPY, SHOULDER, WITH ROTATOR CUFF REPAIR.   Patient has a past medical history significant for GERD, DM, CHF, HTN, OSA.   Pax is a 52-year-young man here for follow up of right shoulder pain. The right shoulder, however, has been bothering him for multiple years. The pain around the right shoulder is worse with lifting  Symptoms are rated as moderate to severe, and have been worsening.  This is significantly impairing activities of daily living.    Please see clinic note for further details on this patient's care.    He has elected for surgical management.   Past Medical History:  Diagnosis Date   Anxiety    CHF (congestive heart failure) (HCC)    Coronary artery disease    Diabetes mellitus without complication (HCC)    type II   GERD (gastroesophageal reflux disease)    not currently   High cholesterol    Hypertension    Myocardial infarction (HCC) 05/30/2020   New onset atrial flutter (HCC) 06/09/2020   Overweight    Sleep apnea    Past Surgical History:  Procedure Laterality Date   CARDIOVERSION N/A 06/10/2020   Procedure: CARDIOVERSION;  Surgeon: Wendie Hamburg, MD;  Location: [facility] ENDOSCOPY;  Service: Cardiovascular;  Laterality: N/A;   CARPAL TUNNEL RELEASE Right 07/11/2022   Procedure: RIGHT CARPAL TUNNEL RELEASE;  Surgeon: Brunilda Capra, MD;  Location: [facility];  Service: Orthopedics;  Laterality: Right;  AXILLARY BLOCK   CARPAL TUNNEL RELEASE Left 08/24/2022   Procedure: LEFT CARPAL TUNNEL RELEASE;  Surgeon: Brunilda Capra, MD;  Location: [facility];  Service: Orthopedics;  Laterality: Left;  60 MIN   CORONARY STENT  INTERVENTION N/A 05/30/2020   Procedure: CORONARY STENT INTERVENTION;  Surgeon: Millicent Ally, MD;  Location: [facility] INVASIVE CV LAB;  Service: Cardiovascular;  Laterality: N/A;   CORONARY/GRAFT ACUTE MI REVASCULARIZATION N/A 05/30/2020   Procedure: Coronary/Graft Acute MI Revascularization;  Surgeon: Millicent Ally, MD;  Location: [facility] INVASIVE CV LAB;  Service: Cardiovascular;  Laterality: N/A;   GAS INSERTION Right 07/19/2021   Procedure: INSERTION OF GAS - C3F8;  Surgeon: Jearline Minder, MD;  Location: [facility] OR;  Service: Ophthalmology;  Laterality: Right;   GAS/FLUID EXCHANGE Right 07/19/2021   Procedure: GAS/FLUID EXCHANGE;  Surgeon: Jearline Minder, MD;  Location: [facility] OR;  Service: Ophthalmology;  Laterality: Right;   LEFT HEART CATH AND CORONARY ANGIOGRAPHY N/A 05/30/2020   Procedure: LEFT HEART CATH AND CORONARY ANGIOGRAPHY;  Surgeon: Millicent Ally, MD;  Location: [facility] INVASIVE CV LAB;  Service: Cardiovascular;  Laterality: N/A;   MEMBRANE PEEL Right 07/19/2021   Procedure: MEMBRANE PEEL;  Surgeon: Jearline Minder, MD;  Location: [facility] OR;  Service: Ophthalmology;  Laterality: Right;   PARS PLANA VITRECTOMY 27 GAUGE Right 09/27/2021   Procedure: PARS PLANA VITRECTOMY 27 GAUGE;  Surgeon: Jearline Minder, MD;  Location: [facility] OR;  Service: Ophthalmology;  Laterality: Right;   PHOTOCOAGULATION WITH LASER Right 07/19/2021   Procedure: PHOTOCOAGULATION WITH LASER;  Surgeon: Jearline Minder, MD;  Location: [facility] OR;  Service: Ophthalmology;  Laterality: Right;   PHOTOCOAGULATION WITH LASER Right 09/27/2021   Procedure: PHOTOCOAGULATION WITH LASER;  Surgeon: Jearline Minder, MD;  Location: [facility] OR;  Service: Ophthalmology;  Laterality: Right;   REPAIR OF  COMPLEX TRACTION RETINAL DETACHMENT Right 07/19/2021   Procedure: REPAIR OF COMPLEX TRACTION RETINAL DETACHMENT;  Surgeon: Jearline Minder, MD;  Location: [facility] OR;  Service: Ophthalmology;  Laterality: Right;   TEE WITHOUT CARDIOVERSION N/A 06/10/2020   Procedure:  TRANSESOPHAGEAL ECHOCARDIOGRAM (TEE);  Surgeon: Wendie Hamburg, MD;  Location: [facility] ENDOSCOPY;  Service: Cardiovascular;  Laterality: N/A;   torn rotator cuff  2018   2018   ULNAR NERVE TRANSPOSITION Right 07/11/2022   Procedure: RIGHT ULNAR NERVE DECOMPRESSION CUBITAL TUNNEL WITH POSSIBLE TRANSPOSITION;  Surgeon: Brunilda Capra, MD;  Location: [facility];  Service: Orthopedics;  Laterality: Right;  AXILLARY BLOCK   ULNAR NERVE TRANSPOSITION Left 08/24/2022   Procedure: LEFT ULNAR NERVE DECOMPRESSION;  Surgeon: Brunilda Capra, MD;  Location: [facility];  Service: Orthopedics;  Laterality: Left;   Social History   Socioeconomic History   Marital status: Married    Spouse name: Not on file   Number of children: Not on file   Years of education: Not on file   Highest education level: Not on file  Occupational History   Occupation: maintenance  Tobacco Use   Smoking status: Never   Smokeless tobacco: Former    Types: Chew    Quit date: 2017  Vaping Use   Vaping status: Never Used  Substance and Sexual Activity   Alcohol use: Not Currently   Drug use: Not Currently    Types: Marijuana    Comment: 2006- last time   Sexual activity: Not on file  Other Topics Concern   Not on file  Social History Narrative   Not on file   Social Drivers of Health   Financial Resource Strain: Not on file  Food Insecurity: Not on file  Transportation Needs: Not on file  Physical Activity: Not on file  Stress: Not on file  Social Connections: Not on file   Family History  Problem Relation Age of Onset   Hypertension Mother    Diabetes Mother    Hypertension Father    Diabetes Father    Diabetes Maternal Grandmother    Hypertension Maternal Grandmother    Diabetes Maternal Grandfather    Hypertension Maternal Grandfather    Diabetes Paternal Grandmother    Hypertension Paternal Grandmother    Diabetes Paternal Grandfather    Hypertension Paternal  Grandfather    Asthma Neg Hx    Allergies  Allergen Reactions   Pineapple Itching and Other (See Comments)    All-over itching (mouth, too)   Tomato Itching and Other (See Comments)    All-over itching (mouth, too) Ketchup, also   Rosuvastatin Calcium  Hives and Rash   Prior to Admission medications   Medication Sig Start Date End Date Taking? Authorizing Provider  amLODipine  (NORVASC ) 5 MG tablet Take 1 tablet (5 mg total) by mouth daily. 08/06/23  Yes   apixaban  (ELIQUIS ) 5 MG TABS tablet Take 1 tablet (5 mg total) by mouth 2 (two) times daily. 08/23/23  Yes Millicent Ally, MD  Ascorbic Acid (VITAMIN C) 1000 MG tablet Take 1,000 mg by mouth daily.   Yes [provider]  atorvastatin  (LIPITOR ) 80 MG tablet Take 1 tablet (80 mg total) by mouth 3 (three) times a week. 02/16/23  Yes Carie Charity, NP  cetirizine  (ZYRTEC ) 10 MG tablet TAKE 1 TABLET(10 MG) BY MOUTH DAILY 08/16/20  Yes Desai, Nikita S, MD  CINNAMON PO Take 1,500 mg by mouth 2 (two) times daily.   Yes [provider]  clopidogrel  (  PLAVIX ) 75 MG tablet Take 1 tablet (75 mg total) by mouth daily. 08/01/23  Yes Millicent Ally, MD  dapagliflozin  propanediol (FARXIGA ) 10 MG TABS tablet Take 1 tablet (10 mg total) by mouth daily. 12/12/23  Yes Hammock, Sheri, NP  Evolocumab  (REPATHA  SURECLICK) 140 MG/ML SOAJ Inject 1 Dose into the skin every 14 (fourteen) days. 05/28/23  Yes Hilty, Aviva Lemmings, MD  metFORMIN  (GLUCOPHAGE ) 1000 MG tablet Take 1 tablet (1,000 mg total) in the morning AND 1/2 tablet (500 mg total) every evening with meals 07/16/23  Yes   metoprolol  succinate (TOPROL -XL) 25 MG 24 hr tablet Take 2 tablets (50 mg total) by mouth daily. 02/04/24  Yes   pantoprazole  (PROTONIX ) 40 MG tablet Take 1 tablet (40 mg total) by mouth 2 (two) times daily. 11/13/23  Yes   Semaglutide ,0.25 or 0.5MG /DOS, (OZEMPIC , 0.25 OR 0.5 MG/DOSE,) 2 MG/3ML SOPN Inject 0.5 mg into the skin once a week. 10/01/23  Yes   spironolactone   (ALDACTONE ) 25 MG tablet Take 1/2 tablet (12.5 mg total) by mouth 2 (two) times daily. 12/13/23  Yes Millicent Ally, MD  traZODone  (DESYREL ) 100 MG tablet Take 1 tablet (100 mg total) by mouth at bedtime. 01/08/23  Yes   zinc gluconate 50 MG tablet Take 50 mg by mouth daily.   Yes [provider]  acetaminophen  (TYLENOL ) 650 MG CR tablet Take 1,300 mg by mouth every 8 (eight) hours as needed for pain.    [provider]  albuterol  (VENTOLIN  HFA) 108 (90 Base) MCG/ACT inhaler Inhale 2 puffs into the lungs every 6 (six) hours as needed for wheezing or shortness of breath.    [provider]  diazepam  (VALIUM ) 5 MG tablet Take 1 tablet (5 mg total) by mouth once for 1 dose 30 minutes prior to procedure, may repeat if needed. 06/01/23     HYDROcodone -acetaminophen  (NORCO) 5-325 MG tablet Take 1-2 tablets by mouth every 6 (six) hours as needed forpain 08/24/22   Kuzma, Kevin, MD  nitroGLYCERIN  (NITROSTAT ) 0.4 MG SL tablet Place 1 tablet (0.4 mg total) under the tongue every 5 (five) minutes as needed for chest pain. 12/28/21   Millicent Ally, MD  nitroGLYCERIN  (NITROSTAT ) 0.4 MG SL tablet Take 1 tablet under the tongue as needed for chest pain every 5 mins up to three times; if no relief, seek medical attention 02/16/23   Carie Charity, NP  polyethylene glycol-electrolytes (NULYTELY) 420 g solution Use as directed 11/30/21     sacubitril -valsartan  (ENTRESTO ) 97-103 MG Take 1 tablet by mouth 2 (two) times daily. 07/16/23   Millicent Ally, MD  Semaglutide ,0.25 or 0.5MG /DOS, (OZEMPIC , 0.25 OR 0.5 MG/DOSE,) 2 MG/3ML SOPN Inject 0.25 mg into the skin once a week. 07/28/22     sildenafil  (REVATIO ) 20 MG tablet Take 20 mg by mouth daily as needed for erectile dysfunction.    [provider]  sildenafil  (REVATIO ) 20 MG tablet Take 1-5 tablets (20-100 mg total) by mouth daily as needed. 11/08/22     tadalafil  (CIALIS ) 20 MG tablet Take 1 tablet (20 mg total) by mouth daily. 01/22/24        ROS: All other systems have been reviewed and were otherwise negative with the exception of those mentioned in the HPI and as above.  Physical Exam: General: Alert, no acute distress Cardiovascular: No pedal edema Respiratory: No cyanosis, no use of accessory musculature GI: No organomegaly, abdomen is soft and non-tender Skin: No lesions in the area of chief complaint  Neurologic: Sensation intact distally Psychiatric: Patient is competent for consent with normal mood and affect Lymphatic: No axillary or cervical lymphadenopathy  MUSCULOSKELETAL:  The range of motion of the right shoulder is to 120 degrees, passive 170 degrees. Negative AC tenderness to palpation. Positive impingement and positive O'Brien's  Imaging: CT arthrogram was reviewed and demonstrates a massive cuff tear of the supraspinatus and infraspinatus. No obvious subscapularis pathology. The biceps does appear to be slightly medial though.  Lateral hanging acromial spur.  Assessment: right shoulder cartilage disorder, impingement syndrome, rotator cuff tear, biceps tendinitis.  Plan: Plan for Procedure(s): ARTHROSCOPY, SHOULDER WITH DEBRIDEMENT DECOMPRESSION, SUBACROMIAL SPACE TENODESIS, BICEPS ARTHROSCOPY, SHOULDER, WITH ROTATOR CUFF REPAIR  The risks benefits and alternatives were discussed with the patient including but not limited to the risks of nonoperative treatment, versus surgical intervention including infection, bleeding, nerve injury,  blood clots, cardiopulmonary complications, morbidity, mortality, among others, and they were willing to proceed.   The patient acknowledged the explanation, agreed to proceed with the plan and consent was signed.   Operative Plan: Right shoulder arthroscopy with SAD, DCE, BT, RCRs Discharge Medications:standard DVT Prophylaxis: none Physical Therapy: outpatient PT Special Discharge needs: Sling. IceMan   Adine Ahmadi, PA-C  02/13/2024 10:36 AM

## 2024-02-14 ENCOUNTER — Ambulatory Visit (HOSPITAL_BASED_OUTPATIENT_CLINIC_OR_DEPARTMENT_OTHER): Payer: Self-pay | Admitting: Certified Registered"

## 2024-02-14 ENCOUNTER — Ambulatory Visit (HOSPITAL_BASED_OUTPATIENT_CLINIC_OR_DEPARTMENT_OTHER)
Admission: RE | Admit: 2024-02-14 | Discharge: 2024-02-14 | Disposition: A | Discharge status: Home / Self Care | Attending: Orthopaedic Surgery | Admitting: Orthopaedic Surgery

## 2024-02-14 ENCOUNTER — Encounter (HOSPITAL_BASED_OUTPATIENT_CLINIC_OR_DEPARTMENT_OTHER)
Admission: RE | Disposition: A | Discharge status: Home / Self Care | Payer: Self-pay | Source: Home / Self Care | Attending: Orthopaedic Surgery

## 2024-02-14 ENCOUNTER — Encounter (HOSPITAL_BASED_OUTPATIENT_CLINIC_OR_DEPARTMENT_OTHER): Payer: Self-pay | Admitting: Orthopaedic Surgery

## 2024-02-14 ENCOUNTER — Other Ambulatory Visit (HOSPITAL_COMMUNITY): Payer: Self-pay

## 2024-02-14 ENCOUNTER — Other Ambulatory Visit: Payer: Self-pay

## 2024-02-14 DIAGNOSIS — M7541 Impingement syndrome of right shoulder: Secondary | ICD-10-CM | POA: Diagnosis not present

## 2024-02-14 DIAGNOSIS — X58XXXA Exposure to other specified factors, initial encounter: Secondary | ICD-10-CM | POA: Insufficient documentation

## 2024-02-14 DIAGNOSIS — Z955 Presence of coronary angioplasty implant and graft: Secondary | ICD-10-CM | POA: Diagnosis not present

## 2024-02-14 DIAGNOSIS — K219 Gastro-esophageal reflux disease without esophagitis: Secondary | ICD-10-CM | POA: Insufficient documentation

## 2024-02-14 DIAGNOSIS — M24111 Other articular cartilage disorders, right shoulder: Secondary | ICD-10-CM | POA: Diagnosis not present

## 2024-02-14 DIAGNOSIS — I110 Hypertensive heart disease with heart failure: Secondary | ICD-10-CM | POA: Diagnosis not present

## 2024-02-14 DIAGNOSIS — I252 Old myocardial infarction: Secondary | ICD-10-CM | POA: Diagnosis not present

## 2024-02-14 DIAGNOSIS — I509 Heart failure, unspecified: Secondary | ICD-10-CM | POA: Insufficient documentation

## 2024-02-14 DIAGNOSIS — I5021 Acute systolic (congestive) heart failure: Secondary | ICD-10-CM | POA: Diagnosis not present

## 2024-02-14 DIAGNOSIS — S43431A Superior glenoid labrum lesion of right shoulder, initial encounter: Secondary | ICD-10-CM | POA: Diagnosis not present

## 2024-02-14 DIAGNOSIS — I2510 Atherosclerotic heart disease of native coronary artery without angina pectoris: Secondary | ICD-10-CM | POA: Insufficient documentation

## 2024-02-14 DIAGNOSIS — Z7901 Long term (current) use of anticoagulants: Secondary | ICD-10-CM | POA: Diagnosis not present

## 2024-02-14 DIAGNOSIS — G4733 Obstructive sleep apnea (adult) (pediatric): Secondary | ICD-10-CM | POA: Insufficient documentation

## 2024-02-14 DIAGNOSIS — M7521 Bicipital tendinitis, right shoulder: Secondary | ICD-10-CM

## 2024-02-14 DIAGNOSIS — G8918 Other acute postprocedural pain: Secondary | ICD-10-CM | POA: Diagnosis not present

## 2024-02-14 DIAGNOSIS — Z7985 Long-term (current) use of injectable non-insulin antidiabetic drugs: Secondary | ICD-10-CM | POA: Diagnosis not present

## 2024-02-14 DIAGNOSIS — M19011 Primary osteoarthritis, right shoulder: Secondary | ICD-10-CM | POA: Insufficient documentation

## 2024-02-14 DIAGNOSIS — F419 Anxiety disorder, unspecified: Secondary | ICD-10-CM | POA: Diagnosis not present

## 2024-02-14 DIAGNOSIS — E119 Type 2 diabetes mellitus without complications: Secondary | ICD-10-CM | POA: Diagnosis not present

## 2024-02-14 DIAGNOSIS — M75101 Unspecified rotator cuff tear or rupture of right shoulder, not specified as traumatic: Secondary | ICD-10-CM | POA: Diagnosis not present

## 2024-02-14 DIAGNOSIS — S46011A Strain of muscle(s) and tendon(s) of the rotator cuff of right shoulder, initial encounter: Secondary | ICD-10-CM | POA: Diagnosis not present

## 2024-02-14 DIAGNOSIS — I4891 Unspecified atrial fibrillation: Secondary | ICD-10-CM | POA: Insufficient documentation

## 2024-02-14 HISTORY — PX: POSTERIOR LUMBAR FUSION 2 WITH HARDWARE REMOVAL: SHX7297

## 2024-02-14 HISTORY — PX: SHOULDER ARTHROSCOPY WITH ROTATOR CUFF REPAIR: SHX5685

## 2024-02-14 HISTORY — PX: BICEPT TENODESIS: SHX5116

## 2024-02-14 LAB — GLUCOSE, CAPILLARY
Glucose-Capillary: 116 mg/dL — ABNORMAL HIGH (ref 70–99)
Glucose-Capillary: 142 mg/dL — ABNORMAL HIGH (ref 70–99)

## 2024-02-14 SURGERY — ARTHROSCOPY, SHOULDER WITH DEBRIDEMENT
Anesthesia: General | Site: Shoulder | Laterality: Right

## 2024-02-14 MED ORDER — DROPERIDOL 2.5 MG/ML IJ SOLN
0.6250 mg | Freq: Once | INTRAMUSCULAR | Status: DC | PRN
Start: 2024-02-14 — End: 2024-02-14

## 2024-02-14 MED ORDER — EPHEDRINE 5 MG/ML INJ
INTRAVENOUS | Status: AC
Start: 2024-02-14 — End: ?
  Filled 2024-02-14: qty 5

## 2024-02-14 MED ORDER — OXYCODONE HCL 5 MG PO TABS
5.0000 mg | ORAL_TABLET | Freq: Four times a day (QID) | ORAL | 0 refills | Status: AC | PRN
  Filled 2024-02-14: qty 30, 4d supply, fill #0

## 2024-02-14 MED ORDER — CEFAZOLIN SODIUM-DEXTROSE 3-4 GM/150ML-% IV SOLN
INTRAVENOUS | Status: AC
  Filled 2024-02-14: qty 150

## 2024-02-14 MED ORDER — ACETAMINOPHEN 500 MG PO TABS
1000.0000 mg | ORAL_TABLET | Freq: Once | ORAL | Status: AC
  Administered 2024-02-14: 1000 mg via ORAL

## 2024-02-14 MED ORDER — TRANEXAMIC ACID-NACL 1000-0.7 MG/100ML-% IV SOLN
INTRAVENOUS | Status: AC
  Filled 2024-02-14: qty 100

## 2024-02-14 MED ORDER — ACETAMINOPHEN 500 MG PO TABS
ORAL_TABLET | ORAL | Status: AC
  Filled 2024-02-14: qty 2

## 2024-02-14 MED ORDER — SUGAMMADEX SODIUM 200 MG/2ML IV SOLN
INTRAVENOUS | Status: DC | PRN
  Administered 2024-02-14: 400 mg via INTRAVENOUS

## 2024-02-14 MED ORDER — PHENYLEPHRINE HCL-NACL 20-0.9 MG/250ML-% IV SOLN
INTRAVENOUS | Status: DC | PRN
  Administered 2024-02-14: 25 ug/min via INTRAVENOUS

## 2024-02-14 MED ORDER — ROCURONIUM BROMIDE 100 MG/10ML IV SOLN
INTRAVENOUS | Status: DC | PRN
  Administered 2024-02-14: 50 mg via INTRAVENOUS

## 2024-02-14 MED ORDER — FENTANYL CITRATE (PF) 100 MCG/2ML IJ SOLN
25.0000 ug | INTRAMUSCULAR | Status: DC | PRN
Start: 2024-02-14 — End: 2024-02-14

## 2024-02-14 MED ORDER — EPHEDRINE SULFATE (PRESSORS) 50 MG/ML IJ SOLN
INTRAMUSCULAR | Status: DC | PRN
  Administered 2024-02-14 (×3): 5 mg via INTRAVENOUS

## 2024-02-14 MED ORDER — GABAPENTIN 100 MG PO CAPS
100.0000 mg | ORAL_CAPSULE | Freq: Three times a day (TID) | ORAL | 0 refills | Status: AC
  Filled 2024-02-14: qty 42, 14d supply, fill #0

## 2024-02-14 MED ORDER — ONDANSETRON HCL 4 MG PO TABS
4.0000 mg | ORAL_TABLET | Freq: Three times a day (TID) | ORAL | 0 refills | Status: AC | PRN
  Filled 2024-02-14: qty 10, 4d supply, fill #0

## 2024-02-14 MED ORDER — ACETAMINOPHEN 500 MG PO TABS
1000.0000 mg | ORAL_TABLET | Freq: Three times a day (TID) | ORAL | 0 refills | Status: AC
  Filled 2024-02-14: qty 100, 17d supply, fill #0

## 2024-02-14 MED ORDER — DEXAMETHASONE SODIUM PHOSPHATE 10 MG/ML IJ SOLN
INTRAMUSCULAR | Status: DC | PRN
  Administered 2024-02-14: 5 mg via INTRAVENOUS

## 2024-02-14 MED ORDER — MIDAZOLAM HCL 2 MG/2ML IJ SOLN
2.0000 mg | Freq: Once | INTRAMUSCULAR | Status: AC
Start: 2024-02-14 — End: 2024-02-14
  Administered 2024-02-14: 2 mg via INTRAVENOUS

## 2024-02-14 MED ORDER — BUPIVACAINE LIPOSOME 1.3 % IJ SUSP
INTRAMUSCULAR | Status: DC | PRN
  Administered 2024-02-14: 10 mL via PERINEURAL

## 2024-02-14 MED ORDER — CEFAZOLIN SODIUM-DEXTROSE 3-4 GM/150ML-% IV SOLN
3.0000 g | INTRAVENOUS | Status: AC
  Administered 2024-02-14: 3 g via INTRAVENOUS

## 2024-02-14 MED ORDER — DEXAMETHASONE SODIUM PHOSPHATE 10 MG/ML IJ SOLN
INTRAMUSCULAR | Status: AC
  Filled 2024-02-14: qty 1

## 2024-02-14 MED ORDER — MIDAZOLAM HCL 2 MG/2ML IJ SOLN
INTRAMUSCULAR | Status: AC
  Filled 2024-02-14: qty 2

## 2024-02-14 MED ORDER — ONDANSETRON HCL 4 MG/2ML IJ SOLN
INTRAMUSCULAR | Status: DC | PRN
  Administered 2024-02-14: 4 mg via INTRAVENOUS

## 2024-02-14 MED ORDER — SODIUM CHLORIDE 0.9 % IR SOLN
Status: DC | PRN
  Administered 2024-02-14: 9000 mL

## 2024-02-14 MED ORDER — FENTANYL CITRATE (PF) 100 MCG/2ML IJ SOLN
INTRAMUSCULAR | Status: DC | PRN
  Administered 2024-02-14: 50 ug via INTRAVENOUS

## 2024-02-14 MED ORDER — ACETAMINOPHEN 10 MG/ML IV SOLN: 1000.0000 mg | Freq: Once | INTRAVENOUS | Status: DC | PRN

## 2024-02-14 MED ORDER — LIDOCAINE 2% (20 MG/ML) 5 ML SYRINGE
INTRAMUSCULAR | Status: AC
  Filled 2024-02-14: qty 5

## 2024-02-14 MED ORDER — PROPOFOL 10 MG/ML IV BOLUS
INTRAVENOUS | Status: DC | PRN
  Administered 2024-02-14: 200 mg via INTRAVENOUS

## 2024-02-14 MED ORDER — FENTANYL CITRATE (PF) 100 MCG/2ML IJ SOLN
INTRAMUSCULAR | Status: AC
  Filled 2024-02-14: qty 2

## 2024-02-14 MED ORDER — LACTATED RINGERS IV SOLN: INTRAVENOUS | Status: DC

## 2024-02-14 MED ORDER — TRANEXAMIC ACID-NACL 1000-0.7 MG/100ML-% IV SOLN: 1000.0000 mg | INTRAVENOUS | Status: DC

## 2024-02-14 MED ORDER — OXYCODONE HCL 5 MG PO TABS: 5.0000 mg | ORAL_TABLET | Freq: Once | ORAL | Status: DC | PRN

## 2024-02-14 MED ORDER — FENTANYL CITRATE (PF) 100 MCG/2ML IJ SOLN
100.0000 ug | Freq: Once | INTRAMUSCULAR | Status: AC
  Administered 2024-02-14: 50 ug via INTRAVENOUS

## 2024-02-14 MED ORDER — GABAPENTIN 300 MG PO CAPS
ORAL_CAPSULE | ORAL | Status: AC
  Filled 2024-02-14: qty 1

## 2024-02-14 MED ORDER — ROCURONIUM BROMIDE 10 MG/ML (PF) SYRINGE
PREFILLED_SYRINGE | INTRAVENOUS | Status: AC
  Filled 2024-02-14: qty 10

## 2024-02-14 MED ORDER — ONDANSETRON HCL 4 MG/2ML IJ SOLN
INTRAMUSCULAR | Status: AC
  Filled 2024-02-14: qty 2

## 2024-02-14 MED ORDER — EPHEDRINE 5 MG/ML INJ
INTRAVENOUS | Status: AC
  Filled 2024-02-14: qty 10

## 2024-02-14 MED ORDER — OXYCODONE HCL 5 MG/5ML PO SOLN: 5.0000 mg | Freq: Once | ORAL | Status: DC | PRN

## 2024-02-14 MED ORDER — BUPIVACAINE HCL (PF) 0.5 % IJ SOLN
INTRAMUSCULAR | Status: DC | PRN
  Administered 2024-02-14: 12 mL via PERINEURAL

## 2024-02-14 MED ORDER — GABAPENTIN 300 MG PO CAPS
300.0000 mg | ORAL_CAPSULE | Freq: Once | ORAL | Status: AC
  Administered 2024-02-14: 300 mg via ORAL

## 2024-02-14 MED ORDER — PROPOFOL 10 MG/ML IV BOLUS
INTRAVENOUS | Status: AC
  Filled 2024-02-14: qty 20

## 2024-02-14 MED ORDER — LIDOCAINE 2% (20 MG/ML) 5 ML SYRINGE
INTRAMUSCULAR | Status: DC | PRN
Start: 2024-02-14 — End: 2024-02-14
  Administered 2024-02-14: 40 mg via INTRAVENOUS

## 2024-02-14 SURGICAL SUPPLY — 47 items
ANCHOR FBRTK 2.6 SUTURETAP 1.3 (Anchor) ×3 IMPLANT
ANCHOR SUT 1.8 FIBERTAK SB KL (Anchor) ×2 IMPLANT
BLADE EXCALIBUR 4.0X13 (MISCELLANEOUS) ×2 IMPLANT
BURR OVAL 8 FLU 4.0X13 (MISCELLANEOUS) ×2 IMPLANT
CANNULA 5.75X71 LONG (CANNULA) IMPLANT
CANNULA PASSPORT 5 (CANNULA) IMPLANT
CANNULA PASSPORT BUTTON 10-40 (CANNULA) ×1 IMPLANT
CANNULA TWIST IN 8.25X7CM (CANNULA) IMPLANT
CHLORAPREP W/TINT 26 (MISCELLANEOUS) ×3 IMPLANT
CLSR STERI-STRIP ANTIMIC 1/2X4 (GAUZE/BANDAGES/DRESSINGS) ×2 IMPLANT
COOLER ICEMAN CLASSIC (MISCELLANEOUS) ×2 IMPLANT
DRAPE IMP U-DRAPE 54X76 (DRAPES) ×2 IMPLANT
DRAPE INCISE IOBAN 66X45 STRL (DRAPES) IMPLANT
DRAPE SHOULDER BEACH CHAIR (DRAPES) ×2 IMPLANT
DW OUTFLOW CASSETTE/TUBE SET (MISCELLANEOUS) ×2 IMPLANT
GAUZE PAD ABD 8X10 STRL (GAUZE/BANDAGES/DRESSINGS) ×2 IMPLANT
GAUZE SPONGE 4X4 12PLY STRL (GAUZE/BANDAGES/DRESSINGS) ×2 IMPLANT
GLOVE BIO SURGEON STRL SZ 6.5 (GLOVE) ×2 IMPLANT
GLOVE BIOGEL PI IND STRL 6.5 (GLOVE) ×2 IMPLANT
GLOVE BIOGEL PI IND STRL 8 (GLOVE) ×2 IMPLANT
GLOVE ECLIPSE 8.0 STRL XLNG CF (GLOVE) ×2 IMPLANT
GOWN STRL REUS W/ TWL LRG LVL3 (GOWN DISPOSABLE) ×3 IMPLANT
GOWN STRL REUS W/TWL XL LVL3 (GOWN DISPOSABLE) ×2 IMPLANT
KIT STABILIZATION SHOULDER (MISCELLANEOUS) ×2 IMPLANT
KIT STR SPEAR 1.8 FBRTK DISP (KITS) ×1 IMPLANT
LASSO CRESCENT QUICKPASS (SUTURE) IMPLANT
MANIFOLD NEPTUNE II (INSTRUMENTS) ×2 IMPLANT
NDL HD SCORPION MEGA LOADER (NEEDLE) IMPLANT
NDL SAFETY ECLIPSE 18X1.5 (NEEDLE) ×2 IMPLANT
PACK ARTHROSCOPY DSU (CUSTOM PROCEDURE TRAY) ×2 IMPLANT
PACK BASIN DAY SURGERY FS (CUSTOM PROCEDURE TRAY) ×2 IMPLANT
PAD COLD SHLDR WRAP-ON (PAD) ×2 IMPLANT
RESTRAINT HEAD UNIVERSAL NS (MISCELLANEOUS) ×2 IMPLANT
SHEET MEDIUM DRAPE 40X70 STRL (DRAPES) ×2 IMPLANT
SLEEVE SCD COMPRESS KNEE MED (STOCKING) ×2 IMPLANT
SLING ARM FOAM STRAP LRG (SOFTGOODS) IMPLANT
SUT MNCRL AB 4-0 PS2 18 (SUTURE) ×2 IMPLANT
SUT PDS AB 0 CT 36 (SUTURE) IMPLANT
SUT TIGER TAPE 7 IN WHITE (SUTURE) IMPLANT
SUTURE FIBERWR #2 38 T-5 BLUE (SUTURE) IMPLANT
SUTURE TAPE TIGERLINK 1.3MM BL (SUTURE) IMPLANT
SYR 5ML LL (SYRINGE) ×2 IMPLANT
TAPE FIBER 2MM 7IN #2 BLUE (SUTURE) IMPLANT
TOWEL GREEN STERILE FF (TOWEL DISPOSABLE) ×4 IMPLANT
TUBE CONNECTING 20X1/4 (TUBING) ×2 IMPLANT
TUBING ARTHROSCOPY IRRIG 16FT (MISCELLANEOUS) ×2 IMPLANT
WAND ABLATOR APOLLO I90 (BUR) ×2 IMPLANT

## 2024-02-14 NOTE — Anesthesia Procedure Notes (Signed)
 Anesthesia Regional Block: Interscalene brachial plexus block   Pre-Anesthetic Checklist: , timeout performed,  Correct Patient, Correct Site, Correct Laterality,  Correct Procedure, Correct Position, site marked,  Risks and benefits discussed,  Surgical consent,  Pre-op evaluation,  At surgeon's request and post-op pain management  Laterality: Right  Prep: chloraprep       Needles:  Injection technique: Single-shot  Needle Type: Echogenic Stimulator Needle     Needle Length: 9cm  Needle Gauge: 21     Additional Needles:   Procedures:,,,, ultrasound used (permanent image in chart),,    Narrative:  Start time: 02/14/2024 9:25 AM End time: 02/14/2024 9:30 AM Injection made incrementally with aspirations every 5 mL.  Performed by: Personally  Anesthesiologist: Willian Harrow, MD  Additional Notes: Discussed risks and benefits of the nerve block in detail, including but not limited vascular injury, permanent nerve damage and infection.   Patient tolerated the procedure well. Local anesthetic introduced in an incremental fashion under minimal resistance after negative aspirations. No paresthesias were elicited. After completion of the procedure, no acute issues were identified and patient continued to be monitored by RN.

## 2024-02-14 NOTE — Anesthesia Procedure Notes (Signed)
 Procedure Name: Intubation Date/Time: 02/14/2024 10:04 AM  Performed by: Noralyn Beams, CRNAPre-anesthesia Checklist: Patient identified, Emergency Drugs available, Suction available and Patient being monitored Patient Re-evaluated:Patient Re-evaluated prior to induction Oxygen Delivery Method: Circle system utilized Preoxygenation: Pre-oxygenation with 100% oxygen Induction Type: IV induction Ventilation: Mask ventilation without difficulty and Oral airway inserted - appropriate to patient size Laryngoscope Size: Mac and 4 Grade View: Grade II Tube type: Oral Tube size: 7.5 mm Number of attempts: 1 Airway Equipment and Method: Stylet, Oral airway and Bite block Placement Confirmation: ETT inserted through vocal cords under direct vision, positive ETCO2 and breath sounds checked- equal and bilateral Secured at: 24 cm Tube secured with: Tape Dental Injury: Teeth and Oropharynx as per pre-operative assessment

## 2024-02-14 NOTE — Interval H&P Note (Signed)
 All questions answered, patient wants to proceed with procedure. ? ?

## 2024-02-14 NOTE — Op Note (Signed)
 Orthopaedic Surgery Operative Note (CSN: [id number])  [name] Merritt  07/26/1971 Date of Surgery: 02/14/2024   DIAGNOSES: Right shoulder, chronic rotator cuff tear, SLAP tear, biceps tendinitis, AC arthritis, and subacromial impingement.  POST-OPERATIVE DIAGNOSIS: same  PROCEDURE: Arthroscopic extensive debridement - 29823 Subdeltoid Bursa, Supraspinatus Tendon, Anterior Labrum, Superior Labrum, and Posterior Labrum Arthroscopic distal clavicle excision - 40981 Arthroscopic subacromial decompression - 19147 Arthroscopic rotator cuff repair - 82956 Arthroscopic biceps tenodesis - 21308   OPERATIVE FINDING: Exam under anesthesia: Normal Articular space:  Anterior posterior labral tearing, capsular contracture noted as well in the interval. Chondral surfaces: Normal Biceps:  Type II SLAP tear Subscapularis: Intact  Supraspinatus: Complete tear retracted to the level of the glenoid, able to be brought to a medialized position after 1 cm medialization of the tuberosity with a single repair with 3 anchors and 6 total sutures. Infraspinatus: Intact    Large tear with significant retraction.  Based on the patient's young age we performed a single row repair with microfracture of the tuberosity with no tension on the repair.  Doing great fixation.  Will hold the therapy for a month.  Post-operative plan: The patient will be non-weightbearing in a sling .  The patient will be discharged home.  DVT prophylaxis not indicated in ambulatory upper extremity patient without known risk factors.   Pain control with PRN pain medication preferring oral medicines.  Follow up plan will be scheduled in approximately 7 days for incision check and XR.  Surgeons:Primary: Micheline Ahr, MD Assistants:Caroline McBane, PA-C Location: [facility] OR ROOM 6 Anesthesia: General with Exparel  interscalene block Antibiotics: 3 g Ancef  Tourniquet time: None Estimated Blood Loss: Minimal Complications: None Specimens:  None Implants: Implant Name Type Inv. Item Serial No. Manufacturer Lot No. LRB No. Used Action  ANCHOR FBRTK 2.6 SUTURETAP 1.3 - [id number] Anchor ANCHOR FBRTK 2.6 SUTURETAP 1.3  [employer] [id number] Right 1 Implanted  ANCHOR FBRTK 2.6 SUTURETAP 1.3 - [id number] Anchor ANCHOR FBRTK 2.6 SUTURETAP 1.3  [employer] [id number] Right 1 Implanted  ANCHOR SUT 1.8 FIBERTAK SB KL - [id number] Anchor ANCHOR SUT 1.8 FIBERTAK SB KL  [employer] [id number] Right 1 Implanted  ANCHOR SUT 1.8 FIBERTAK SB KL - [id number] Anchor ANCHOR SUT 1.8 FIBERTAK SB KL  [employer] [id number] Right 1 Implanted  ANCHOR FBRTK 2.6 SUTURETAP 1.3 - [id number] Anchor ANCHOR FBRTK 2.6 SUTURETAP 1.3  [employer] [id number] Right 1 Implanted    Indications for Surgery:   [name] Merritt is a 53 y.o. male with continued shoulder pain refractory to nonoperative measures for extended period of time.  Large protracted tear was noted.  There was concern for repair and the need for arthroplasty.  He wanted to proceed with repair options.  The risks and benefits were explained at length including but not limited to continued pain, cuff failure, biceps tenodesis failure, stiffness, need for further surgery and infection.   Procedure:   Patient was correctly identified in the preoperative holding area and operative site marked.  Patient brought to OR and positioned beachchair on an Benson table ensuring that all bony prominences were padded and the head was in an appropriate location.  Anesthesia was induced and the operative shoulder was prepped and draped in the usual sterile fashion.  Timeout was called preincision.  A standard posterior viewing portal was made after localizing the portal with a spinal needle.  An anterior accessory portal was also made.  After clearing the articular space the camera was positioned in the subacromial space.  Findings above.    Extensive debridement was performed of the anterior interval tissue, labral fraying and  the bursa.  Glenoid bone, glenoid cartilage, humeral bone were all debrided.  Subacromial decompression: We made a lateral portal with spinal needle guidance. We then proceeded to debride bursal tissue extensively with a shaver and arthrocare device. At that point we continued to identify the borders of the acromion and identify the spur. We then carefully preserved the deltoid fascia and used a burr to convert the acromion to a Type 1 flat acromion without issue.  Biceps tenodesis: We marked the tendon and then performed a tenotomy and debridement of the stump in the articular space. We then identified the biceps tendon in its groove suprapec with the arthroscope in the lateral portal taking care to move from lateral to medial to avoid injury to the subscapularis. At that point we unroofed the tendon itself and mobilized it. An accessory anterior portal was made in line with the tendon and we grasped it from the anterior superior portal and worked from the accessory anterior portal. Two Fibertak 1.39mm knotless anchors were placed in the groove and the tendon was secured in a luggage loop style fashion with a pass of the limb of suture through the tendon using a scorpion device to avoid pull-through.  Repair was completed with good tension on the tendon.  Residual stump of the tendon was removed after being resected with a RF ablator.  Distal Clavicle resection:  The scope was placed in the subacromial space from the posterior portal.  A hemostat was placed through the anterior portal and we spread at the [facility] joint.  A burr was then inserted and 10 mm of distal clavicle was resected taking care to avoid damage to the capsule around the joint and avoiding overhanging bone posteriorly.    Rotator cuff repair: We identified the large tear.  It was not amenable to a double row repair.  We instead medialized the tuberosity a centimeter.  We then placed 3 double loaded fiber tack anchors 2.6 mm at the articular  margin.  We passed each in a simple fashion and tied alternating half inch knots obtaining good fixation and compression of tissue to bone.  We had we had placed a traction stitch in perform multiple releases to obtain good mobility of the tendon.  There was no tension on our repair when I was finally finished.  We used a small awl to punch tiny holes in the tuberosity to allow for eflux of healing elements.  The incisions were closed with absorbable monocryl and steri strips.  A sterile dressing was placed along with a sling. The patient was awoken from general anesthesia and taken to the PACU in stable condition without complication.   Nicholas Bari, PA-C, present and scrubbed throughout the case, critical for completion in a timely fashion, and for retraction, instrumentation, closure.

## 2024-02-14 NOTE — Progress Notes (Signed)
Assisted Dr. Hollis with right, interscalene , ultrasound guided block. Side rails up, monitors on throughout procedure. See vital signs in flow sheet. Tolerated Procedure well. 

## 2024-02-14 NOTE — Transfer of Care (Signed)
 Immediate Anesthesia Transfer of Care Note  Patient: [name] Merritt  Procedure(s) Performed: ARTHROSCOPY, SHOULDER WITH DEBRIDEMENT (Right: Shoulder) TENODESIS, BICEPS (Right: Shoulder) ARTHROSCOPY, SHOULDER, WITH ROTATOR CUFF REPAIR (Right: Shoulder)  Patient Location: PACU  Anesthesia Type:GA combined with regional for post-op pain  Level of Consciousness: drowsy  Airway & Oxygen Therapy: Patient Spontanous Breathing and Patient connected to face mask oxygen  Post-op Assessment: Report given to RN and Post -op Vital signs reviewed and stable  Post vital signs: Reviewed and stable  Last Vitals:  Vitals Value Taken Time  BP 156/99 02/14/24 1131  Temp    Pulse 58 02/14/24 1134  Resp 18 02/14/24 1134  SpO2 96 % 02/14/24 1134  Vitals shown include unfiled device data.  Last Pain:  Vitals:   02/14/24 0819  TempSrc: Tympanic  PainSc: 0-No pain      Patients Stated Pain Goal: 6 (02/14/24 0819)  Complications: No notable events documented.

## 2024-02-14 NOTE — Anesthesia Preprocedure Evaluation (Addendum)
 Anesthesia Evaluation  Patient identified by MRN, date of birth, ID band Patient awake    Reviewed: Allergy & Precautions, NPO status , Patient's Chart, lab work & pertinent test results  Airway Mallampati: II  TM Distance: >3 FB Neck ROM: Full    Dental  (+) Teeth Intact, Dental Advisory Given, Chipped,    Pulmonary sleep apnea    breath sounds clear to auscultation       Cardiovascular hypertension, + CAD, + Past MI, + Cardiac Stents and +CHF  + dysrhythmias Atrial Fibrillation  Rhythm:Regular Rate:Normal  Echo:   1. Apical hypokinesis with overall preserved LV systolic function.   2. Left ventricular ejection fraction, by estimation, is 55 to 60%. The  left ventricle has normal function. The left ventricle has no regional  wall motion abnormalities. There is mild left ventricular hypertrophy.  Left ventricular diastolic parameters  were normal.   3. Right ventricular systolic function is normal. The right ventricular  size is normal. There is normal pulmonary artery systolic pressure.   4. The mitral valve is normal in structure. No evidence of mitral valve  regurgitation. No evidence of mitral stenosis.   5. The aortic valve is tricuspid. Aortic valve regurgitation is not  visualized. Mild aortic valve sclerosis is present, with no evidence of  aortic valve stenosis.   6. The inferior vena cava is normal in size with greater than 50%  respiratory variability, suggesting right atrial pressure of 3 mmHg.      Neuro/Psych   Anxiety     negative neurological ROS     GI/Hepatic Neg liver ROS,GERD  ,,  Endo/Other  diabetes    Renal/GU negative Renal ROS  negative genitourinary   Musculoskeletal negative musculoskeletal ROS (+)    Abdominal   Peds negative pediatric ROS (+)  Hematology negative hematology ROS (+)   Anesthesia Other Findings   Reproductive/Obstetrics                              Anesthesia Physical Anesthesia Plan  ASA: 3  Anesthesia Plan: General   Post-op Pain Management: Regional block*   Induction: Intravenous  PONV Risk Score and Plan: 3 and Ondansetron , Dexamethasone  and Midazolam   Airway Management Planned: Oral ETT  Additional Equipment: None  Intra-op Plan:   Post-operative Plan: Extubation in OR  Informed Consent: I have reviewed the patients History and Physical, chart, labs and discussed the procedure including the risks, benefits and alternatives for the proposed anesthesia with the patient or authorized representative who has indicated his/her understanding and acceptance.     Dental advisory given  Plan Discussed with: CRNA  Anesthesia Plan Comments:        Anesthesia Quick Evaluation

## 2024-02-15 ENCOUNTER — Encounter (HOSPITAL_BASED_OUTPATIENT_CLINIC_OR_DEPARTMENT_OTHER): Payer: Self-pay | Admitting: Orthopaedic Surgery

## 2024-02-15 NOTE — Anesthesia Postprocedure Evaluation (Signed)
 Anesthesia Post Note  Patient: [name] Merritt  Procedure(s) Performed: ARTHROSCOPY, SHOULDER WITH DEBRIDEMENT (Right: Shoulder) TENODESIS, BICEPS (Right: Shoulder) ARTHROSCOPY, SHOULDER, WITH ROTATOR CUFF REPAIR (Right: Shoulder)     Patient location during evaluation: PACU Anesthesia Type: General Level of consciousness: awake and alert Pain management: pain level controlled Vital Signs Assessment: post-procedure vital signs reviewed and stable Respiratory status: spontaneous breathing, nonlabored ventilation, respiratory function stable and patient connected to nasal cannula oxygen Cardiovascular status: blood pressure returned to baseline and stable Postop Assessment: no apparent nausea or vomiting Anesthetic complications: no   No notable events documented.  Last Vitals:  Vitals:   02/14/24 1200 02/14/24 1228  BP: (!) 140/84 (!) 153/96  Pulse: (!) 54 (!) 56  Resp:  20  Temp:  36.8 C  SpO2: 96% 95%    Last Pain:  Vitals:   02/14/24 1228  TempSrc: Temporal  PainSc: 0-No pain                 Willian Harrow

## 2024-02-22 ENCOUNTER — Other Ambulatory Visit (HOSPITAL_COMMUNITY): Payer: Self-pay

## 2024-02-22 DIAGNOSIS — M24111 Other articular cartilage disorders, right shoulder: Secondary | ICD-10-CM | POA: Diagnosis not present

## 2024-02-22 MED ORDER — METHOCARBAMOL 750 MG PO TABS
750.0000 mg | ORAL_TABLET | Freq: Every evening | ORAL | 1 refills | Status: AC | PRN
  Filled 2024-02-22: qty 30, 30d supply, fill #0

## 2024-02-26 ENCOUNTER — Other Ambulatory Visit (HOSPITAL_COMMUNITY): Payer: Self-pay

## 2024-02-26 DIAGNOSIS — G4700 Insomnia, unspecified: Secondary | ICD-10-CM | POA: Diagnosis not present

## 2024-02-26 DIAGNOSIS — E113593 Type 2 diabetes mellitus with proliferative diabetic retinopathy without macular edema, bilateral: Secondary | ICD-10-CM | POA: Diagnosis not present

## 2024-02-26 DIAGNOSIS — E7800 Pure hypercholesterolemia, unspecified: Secondary | ICD-10-CM | POA: Diagnosis not present

## 2024-02-26 DIAGNOSIS — E1169 Type 2 diabetes mellitus with other specified complication: Secondary | ICD-10-CM | POA: Diagnosis not present

## 2024-02-26 DIAGNOSIS — G4733 Obstructive sleep apnea (adult) (pediatric): Secondary | ICD-10-CM | POA: Diagnosis not present

## 2024-02-26 DIAGNOSIS — N529 Male erectile dysfunction, unspecified: Secondary | ICD-10-CM | POA: Diagnosis not present

## 2024-02-26 DIAGNOSIS — I10 Essential (primary) hypertension: Secondary | ICD-10-CM | POA: Diagnosis not present

## 2024-02-26 DIAGNOSIS — I4892 Unspecified atrial flutter: Secondary | ICD-10-CM | POA: Diagnosis not present

## 2024-02-26 MED ORDER — OZEMPIC (1 MG/DOSE) 4 MG/3ML ~~LOC~~ SOPN
1.0000 mg | PEN_INJECTOR | SUBCUTANEOUS | 5 refills | Status: AC
  Filled 2024-02-26 – 2024-03-03 (×3): qty 3, 28d supply, fill #0
  Filled 2024-03-24 – 2024-03-26 (×2): qty 3, 28d supply, fill #1
  Filled 2024-04-17 – 2024-07-08 (×2): qty 3, 28d supply, fill #2
  Filled 2024-08-05: qty 3, 28d supply, fill #3
  Filled 2024-11-19: qty 3, 28d supply, fill #0

## 2024-02-27 ENCOUNTER — Other Ambulatory Visit (HOSPITAL_COMMUNITY): Payer: Self-pay

## 2024-02-27 MED ORDER — OZEMPIC (1 MG/DOSE) 4 MG/3ML ~~LOC~~ SOPN
1.0000 mg | PEN_INJECTOR | SUBCUTANEOUS | 5 refills | Status: AC
  Filled 2024-02-27 – 2024-04-18 (×3): qty 3, 28d supply, fill #0
  Filled 2024-05-20: qty 3, 28d supply, fill #1
  Filled 2024-06-13: qty 3, 28d supply, fill #2
  Filled 2024-08-05 – 2024-09-01 (×2): qty 3, 28d supply, fill #3
  Filled 2024-09-25: qty 3, 28d supply, fill #4
  Filled 2024-10-21: qty 3, 28d supply, fill #5

## 2024-03-03 ENCOUNTER — Other Ambulatory Visit: Payer: Self-pay

## 2024-03-10 DIAGNOSIS — S46011D Strain of muscle(s) and tendon(s) of the rotator cuff of right shoulder, subsequent encounter: Secondary | ICD-10-CM | POA: Diagnosis not present

## 2024-03-11 ENCOUNTER — Other Ambulatory Visit (HOSPITAL_COMMUNITY): Payer: Self-pay

## 2024-03-12 ENCOUNTER — Other Ambulatory Visit (HOSPITAL_COMMUNITY): Payer: Self-pay

## 2024-03-12 DIAGNOSIS — S46011D Strain of muscle(s) and tendon(s) of the rotator cuff of right shoulder, subsequent encounter: Secondary | ICD-10-CM | POA: Diagnosis not present

## 2024-03-13 DIAGNOSIS — E113511 Type 2 diabetes mellitus with proliferative diabetic retinopathy with macular edema, right eye: Secondary | ICD-10-CM | POA: Diagnosis not present

## 2024-03-14 DIAGNOSIS — H1131 Conjunctival hemorrhage, right eye: Secondary | ICD-10-CM | POA: Diagnosis not present

## 2024-03-14 DIAGNOSIS — E113511 Type 2 diabetes mellitus with proliferative diabetic retinopathy with macular edema, right eye: Secondary | ICD-10-CM | POA: Diagnosis not present

## 2024-03-14 DIAGNOSIS — H5711 Ocular pain, right eye: Secondary | ICD-10-CM | POA: Diagnosis not present

## 2024-03-14 DIAGNOSIS — H11431 Conjunctival hyperemia, right eye: Secondary | ICD-10-CM | POA: Diagnosis not present

## 2024-03-18 ENCOUNTER — Other Ambulatory Visit (HOSPITAL_COMMUNITY): Payer: Self-pay

## 2024-03-20 DIAGNOSIS — S46011D Strain of muscle(s) and tendon(s) of the rotator cuff of right shoulder, subsequent encounter: Secondary | ICD-10-CM | POA: Diagnosis not present

## 2024-03-24 ENCOUNTER — Other Ambulatory Visit (HOSPITAL_COMMUNITY): Payer: Self-pay

## 2024-03-24 DIAGNOSIS — S46011D Strain of muscle(s) and tendon(s) of the rotator cuff of right shoulder, subsequent encounter: Secondary | ICD-10-CM | POA: Diagnosis not present

## 2024-03-26 ENCOUNTER — Other Ambulatory Visit (HOSPITAL_COMMUNITY): Payer: Self-pay

## 2024-03-27 DIAGNOSIS — S46011D Strain of muscle(s) and tendon(s) of the rotator cuff of right shoulder, subsequent encounter: Secondary | ICD-10-CM | POA: Diagnosis not present

## 2024-04-01 DIAGNOSIS — S46011D Strain of muscle(s) and tendon(s) of the rotator cuff of right shoulder, subsequent encounter: Secondary | ICD-10-CM | POA: Diagnosis not present

## 2024-04-03 DIAGNOSIS — S46011D Strain of muscle(s) and tendon(s) of the rotator cuff of right shoulder, subsequent encounter: Secondary | ICD-10-CM | POA: Diagnosis not present

## 2024-04-10 ENCOUNTER — Other Ambulatory Visit (HOSPITAL_COMMUNITY): Payer: Self-pay

## 2024-04-10 DIAGNOSIS — S46011D Strain of muscle(s) and tendon(s) of the rotator cuff of right shoulder, subsequent encounter: Secondary | ICD-10-CM | POA: Diagnosis not present

## 2024-04-14 ENCOUNTER — Other Ambulatory Visit (HOSPITAL_COMMUNITY): Payer: Self-pay

## 2024-04-14 DIAGNOSIS — S46011D Strain of muscle(s) and tendon(s) of the rotator cuff of right shoulder, subsequent encounter: Secondary | ICD-10-CM | POA: Diagnosis not present

## 2024-04-17 ENCOUNTER — Other Ambulatory Visit (HOSPITAL_COMMUNITY): Payer: Self-pay

## 2024-04-18 ENCOUNTER — Other Ambulatory Visit (HOSPITAL_COMMUNITY): Payer: Self-pay

## 2024-04-22 DIAGNOSIS — E113512 Type 2 diabetes mellitus with proliferative diabetic retinopathy with macular edema, left eye: Secondary | ICD-10-CM | POA: Diagnosis not present

## 2024-04-22 DIAGNOSIS — S46011D Strain of muscle(s) and tendon(s) of the rotator cuff of right shoulder, subsequent encounter: Secondary | ICD-10-CM | POA: Diagnosis not present

## 2024-04-24 DIAGNOSIS — E113511 Type 2 diabetes mellitus with proliferative diabetic retinopathy with macular edema, right eye: Secondary | ICD-10-CM | POA: Diagnosis not present

## 2024-05-01 DIAGNOSIS — S46011D Strain of muscle(s) and tendon(s) of the rotator cuff of right shoulder, subsequent encounter: Secondary | ICD-10-CM | POA: Diagnosis not present

## 2024-05-06 ENCOUNTER — Other Ambulatory Visit (HOSPITAL_COMMUNITY): Payer: Self-pay

## 2024-05-09 ENCOUNTER — Other Ambulatory Visit (HOSPITAL_COMMUNITY): Payer: Self-pay

## 2024-05-12 ENCOUNTER — Other Ambulatory Visit (HOSPITAL_COMMUNITY): Payer: Self-pay

## 2024-05-13 DIAGNOSIS — S46011D Strain of muscle(s) and tendon(s) of the rotator cuff of right shoulder, subsequent encounter: Secondary | ICD-10-CM | POA: Diagnosis not present

## 2024-05-15 DIAGNOSIS — S46011D Strain of muscle(s) and tendon(s) of the rotator cuff of right shoulder, subsequent encounter: Secondary | ICD-10-CM | POA: Diagnosis not present

## 2024-05-20 ENCOUNTER — Other Ambulatory Visit (HOSPITAL_COMMUNITY): Payer: Self-pay

## 2024-06-04 ENCOUNTER — Other Ambulatory Visit (HOSPITAL_COMMUNITY): Payer: Self-pay

## 2024-06-09 ENCOUNTER — Other Ambulatory Visit (HOSPITAL_COMMUNITY): Payer: Self-pay

## 2024-06-09 ENCOUNTER — Other Ambulatory Visit: Payer: Self-pay

## 2024-06-09 DIAGNOSIS — E113511 Type 2 diabetes mellitus with proliferative diabetic retinopathy with macular edema, right eye: Secondary | ICD-10-CM | POA: Diagnosis not present

## 2024-06-10 ENCOUNTER — Other Ambulatory Visit (HOSPITAL_COMMUNITY): Payer: Self-pay

## 2024-06-10 MED ORDER — ELIQUIS 5 MG PO TABS
5.0000 mg | ORAL_TABLET | Freq: Two times a day (BID) | ORAL | 3 refills | Status: AC
  Filled 2024-06-10 – 2024-09-02 (×2): qty 180, 90d supply, fill #0
  Filled 2024-09-02: qty 180, 90d supply, fill #1

## 2024-06-11 ENCOUNTER — Other Ambulatory Visit (HOSPITAL_COMMUNITY): Payer: Self-pay

## 2024-06-13 ENCOUNTER — Encounter: Payer: Self-pay | Admitting: Internal Medicine

## 2024-06-13 ENCOUNTER — Other Ambulatory Visit (HOSPITAL_COMMUNITY): Payer: Self-pay

## 2024-06-13 ENCOUNTER — Ambulatory Visit: Attending: Internal Medicine | Admitting: Internal Medicine

## 2024-06-13 VITALS — BP 110/72 | HR 72 | Resp 96 | Ht 73.0 in | Wt 270.0 lb

## 2024-06-13 DIAGNOSIS — E785 Hyperlipidemia, unspecified: Secondary | ICD-10-CM | POA: Diagnosis not present

## 2024-06-13 DIAGNOSIS — I10 Essential (primary) hypertension: Secondary | ICD-10-CM

## 2024-06-13 DIAGNOSIS — G4733 Obstructive sleep apnea (adult) (pediatric): Secondary | ICD-10-CM | POA: Diagnosis not present

## 2024-06-13 DIAGNOSIS — I255 Ischemic cardiomyopathy: Secondary | ICD-10-CM

## 2024-06-13 DIAGNOSIS — E119 Type 2 diabetes mellitus without complications: Secondary | ICD-10-CM | POA: Diagnosis not present

## 2024-06-13 MED ORDER — NITROGLYCERIN 0.4 MG SL SUBL
0.4000 mg | SUBLINGUAL_TABLET | SUBLINGUAL | 3 refills | Status: AC | PRN
  Filled 2024-06-13: qty 25, 10d supply, fill #0

## 2024-06-13 NOTE — Patient Instructions (Signed)
 Medication Instructions:  NO CHANGES  *If you need a refill on your cardiac medications before your next appointment, please call your pharmacy*   Follow-Up: At [facility], you and your health needs are our priority.  As part of our continuing mission to provide you with exceptional heart care, our providers are all part of one team.  This team includes your primary Cardiologist (physician) and Advanced Practice Providers or APPs (Physician Assistants and Nurse Practitioners) who all work together to provide you with the care you need, when you need it.  Your next appointment:    12 months with Dr. Maximo Spar  We recommend signing up for the patient portal called "MyChart".  Sign up information is provided on this After Visit Summary.  MyChart is used to connect with patients for Virtual Visits (Telemedicine).  Patients are able to view lab/test results, encounter notes, upcoming appointments, etc.  Non-urgent messages can be sent to your provider as well.   To learn more about what you can do with MyChart, go to [email or web address].

## 2024-06-13 NOTE — Progress Notes (Signed)
 LIPID CLINIC CONSULT NOTE  Chief Complaint:  Follow-up dyslipidemia  Primary Care Physician: Rexanne Ingle, MD  Primary Cardiologist:  Debby Sor, MD (Inactive)  HPI:  [name] Merritt is a 53 y.o. male who is being seen today for the evaluation of dyslipidemia at the request of Rexanne Ingle, MD.  [name] Merritt is seen today for management of dyslipidemia.  He has a history of coronary artery disease with acute coronary syndrome in August 2021 status post PCI.  He was also noted to have atrial flutter and underwent cardioversion for this.  Additional comorbidities include type 2 diabetes, marked dyslipidemia hypertension and morbid obesity.  He is recently been struggling with fatigue and is scheduled for a split-night study.  He is also had problems with plantar fasciitis and is limited his activity.  He was previously working a third shift job but had to stop that due to fatigue and shortness of breath.  He had recent lipids which showed total cholesterol 179, triglycerides 52, HDL 45 and LDL 124.  This demonstrates an increase in LDL from previous values in the 80s, probably related to decreased activity as he reports compliance with both high potency atorvastatin  80 mg and Zetia  10 mg daily.  He is working actively more at weight loss and dietary changes at this time but remains well above a target LDL less than 70.  Of note, [name] Merritt did agree to participation in the AEGIS-II trial for which she is now in the follow-up period.  07/04/2021  [name] Merritt is seen today in follow-up.  Overall he says he is doing fairly well on the Repatha .  He is tolerating it without any significant side effects.  Cholesterol has improved dramatically with total now 126, HDL 46, LDL 59 and triglycerides 116.  His only main complaint now is shortness of breath primarily when he started to fall asleep.  He was diagnosed by his cardiologist and sleep specialist Dr. Sor with severe obstructive sleep  apnea.  He was ordered for CPAP about 2 months ago but due to supply chain issues has not received his device.  After discussing this with Wanda, it seems that there will be several months more of wait until the supply chain can be improved.  06/13/2024  [name] Merritt is seen today in follow-up.  He is transitioning care to me from Dr. Charlena Sor who recently retired.  I had been seeing him for lipid management.  He has a history as outlined above of ST elevation MI in 2021.  He is done very well since then.  He had some congestive heart failure associated with that as well as postprocedural atrial flutter, but has had no recurrence and remains on anticoagulation.  He also has a history of diabetes but recently has been on Ozempic  with significant weight loss.  Blood pressure is well-controlled today 110/72.  Denies any chest pain or shortness of breath.  EKG shows a normal sinus rhythm with inferior and anterior infarct pattern.  He has OSA on CPAP and is compliant with that.  Lipids are fairly well-controlled with total cholesterol 131, HDL 42, triglycerides 89 and LDL 73.  PMHx:  Past Medical History:  Diagnosis Date   Anxiety    CHF (congestive heart failure) (HCC)    Coronary artery disease    Diabetes mellitus without complication (HCC)    type II   GERD (gastroesophageal reflux disease)    not currently   High cholesterol    Hypertension  Myocardial infarction (HCC) 05/30/2020   New onset atrial flutter (HCC) 06/09/2020   Overweight    Sleep apnea     Past Surgical History:  Procedure Laterality Date   BICEPT TENODESIS Right 02/14/2024   Procedure: TENODESIS, BICEPS;  Surgeon: Cristy Bonner DASEN, MD;  Location: [facility];  Service: Orthopedics;  Laterality: Right;   CARDIOVERSION N/A 06/10/2020   Procedure: CARDIOVERSION;  Surgeon: Kate Lonni CROME, MD;  Location: [facility] ENDOSCOPY;  Service: Cardiovascular;  Laterality: N/A;   CARPAL TUNNEL RELEASE Right 07/11/2022    Procedure: RIGHT CARPAL TUNNEL RELEASE;  Surgeon: Murrell Drivers, MD;  Location: [facility];  Service: Orthopedics;  Laterality: Right;  AXILLARY BLOCK   CARPAL TUNNEL RELEASE Left 08/24/2022   Procedure: LEFT CARPAL TUNNEL RELEASE;  Surgeon: Murrell Drivers, MD;  Location: [facility];  Service: Orthopedics;  Laterality: Left;  60 MIN   CORONARY STENT INTERVENTION N/A 05/30/2020   Procedure: CORONARY STENT INTERVENTION;  Surgeon: Burnard Debby LABOR, MD;  Location: [facility] INVASIVE CV LAB;  Service: Cardiovascular;  Laterality: N/A;   CORONARY/GRAFT ACUTE MI REVASCULARIZATION N/A 05/30/2020   Procedure: Coronary/Graft Acute MI Revascularization;  Surgeon: Burnard Debby LABOR, MD;  Location: [facility] INVASIVE CV LAB;  Service: Cardiovascular;  Laterality: N/A;   GAS INSERTION Right 07/19/2021   Procedure: INSERTION OF GAS - C3F8;  Surgeon: Tobie Baptist, MD;  Location: [facility] OR;  Service: Ophthalmology;  Laterality: Right;   GAS/FLUID EXCHANGE Right 07/19/2021   Procedure: GAS/FLUID EXCHANGE;  Surgeon: Tobie Baptist, MD;  Location: [facility] OR;  Service: Ophthalmology;  Laterality: Right;   LEFT HEART CATH AND CORONARY ANGIOGRAPHY N/A 05/30/2020   Procedure: LEFT HEART CATH AND CORONARY ANGIOGRAPHY;  Surgeon: Burnard Debby LABOR, MD;  Location: [facility] INVASIVE CV LAB;  Service: Cardiovascular;  Laterality: N/A;   MEMBRANE PEEL Right 07/19/2021   Procedure: MEMBRANE PEEL;  Surgeon: Tobie Baptist, MD;  Location: [facility] OR;  Service: Ophthalmology;  Laterality: Right;   PARS PLANA VITRECTOMY 27 GAUGE Right 09/27/2021   Procedure: PARS PLANA VITRECTOMY 27 GAUGE;  Surgeon: Tobie Baptist, MD;  Location: [facility] OR;  Service: Ophthalmology;  Laterality: Right;   PHOTOCOAGULATION WITH LASER Right 07/19/2021   Procedure: PHOTOCOAGULATION WITH LASER;  Surgeon: Tobie Baptist, MD;  Location: [facility] OR;  Service: Ophthalmology;  Laterality: Right;   PHOTOCOAGULATION WITH LASER Right 09/27/2021   Procedure: PHOTOCOAGULATION WITH LASER;   Surgeon: Tobie Baptist, MD;  Location: [facility] OR;  Service: Ophthalmology;  Laterality: Right;   POSTERIOR LUMBAR FUSION 2 WITH HARDWARE REMOVAL Right 02/14/2024   Procedure: ARTHROSCOPY, SHOULDER WITH DEBRIDEMENT;  Surgeon: Cristy Bonner DASEN, MD;  Location: [facility];  Service: Orthopedics;  Laterality: Right;   REPAIR OF COMPLEX TRACTION RETINAL DETACHMENT Right 07/19/2021   Procedure: REPAIR OF COMPLEX TRACTION RETINAL DETACHMENT;  Surgeon: Tobie Baptist, MD;  Location: [facility] OR;  Service: Ophthalmology;  Laterality: Right;   SHOULDER ARTHROSCOPY WITH ROTATOR CUFF REPAIR Right 02/14/2024   Procedure: ARTHROSCOPY, SHOULDER, WITH ROTATOR CUFF REPAIR;  Surgeon: Cristy Bonner DASEN, MD;  Location: [facility];  Service: Orthopedics;  Laterality: Right;   TEE WITHOUT CARDIOVERSION N/A 06/10/2020   Procedure: TRANSESOPHAGEAL ECHOCARDIOGRAM (TEE);  Surgeon: Kate Lonni CROME, MD;  Location: [facility] ENDOSCOPY;  Service: Cardiovascular;  Laterality: N/A;   torn rotator cuff  2018   2018   ULNAR NERVE TRANSPOSITION Right 07/11/2022   Procedure: RIGHT ULNAR NERVE DECOMPRESSION CUBITAL TUNNEL WITH POSSIBLE TRANSPOSITION;  Surgeon: Murrell Drivers, MD;  Location:  SURGERY  CENTER;  Service: Orthopedics;  Laterality: Right;  AXILLARY BLOCK   ULNAR NERVE TRANSPOSITION Left 08/24/2022   Procedure: LEFT ULNAR NERVE DECOMPRESSION;  Surgeon: Murrell Drivers, MD;  Location: [facility];  Service: Orthopedics;  Laterality: Left;    FAMHx:  Family History  Problem Relation Age of Onset   Hypertension Mother    Diabetes Mother    Hypertension Father    Diabetes Father    Diabetes Maternal Grandmother    Hypertension Maternal Grandmother    Diabetes Maternal Grandfather    Hypertension Maternal Grandfather    Diabetes Paternal Grandmother    Hypertension Paternal Grandmother    Diabetes Paternal Grandfather    Hypertension Paternal Grandfather    Asthma Neg Hx     SOCHx:    reports that he has never smoked. He quit smokeless tobacco use about 8 years ago.  His smokeless tobacco use included chew. He reports that he does not currently use alcohol. He reports that he does not currently use drugs after having used the following drugs: Marijuana.  ALLERGIES:  Allergies  Allergen Reactions   Pineapple Itching and Other (See Comments)    All-over itching (mouth, too)   Tomato Itching and Other (See Comments)    All-over itching (mouth, too) Ketchup, also   Rosuvastatin Calcium  Hives and Rash    ROS: Pertinent items noted in HPI and remainder of comprehensive ROS otherwise negative.  HOME MEDS: Current Outpatient Medications on File Prior to Visit  Medication Sig Dispense Refill   amLODipine  (NORVASC ) 5 MG tablet Take 1 tablet (5 mg total) by mouth daily. 90 tablet 3   apixaban  (ELIQUIS ) 5 MG TABS tablet Take 1 tablet (5 mg total) by mouth 2 (two) times daily. 180 tablet 3   Ascorbic Acid (VITAMIN C) 1000 MG tablet Take 1,000 mg by mouth daily.     atorvastatin  (LIPITOR ) 80 MG tablet Take 1 tablet (80 mg total) by mouth 3 (three) times a week. 90 tablet 3   CINNAMON PO Take 1,500 mg by mouth 2 (two) times daily.     clopidogrel  (PLAVIX ) 75 MG tablet Take 1 tablet (75 mg total) by mouth daily. 90 tablet 1   dapagliflozin  propanediol (FARXIGA ) 10 MG TABS tablet Take 1 tablet (10 mg total) by mouth daily. 30 tablet 11   Evolocumab  (REPATHA  SURECLICK) 140 MG/ML SOAJ Inject 1 Dose into the skin every 14 (fourteen) days. 2 mL 11   metFORMIN  (GLUCOPHAGE ) 1000 MG tablet Take 1 tablet (1,000 mg total) in the morning AND 1/2 tablet (500 mg total) every evening with meals 135 tablet 3   metoprolol  succinate (TOPROL -XL) 25 MG 24 hr tablet Take 2 tablets (50 mg total) by mouth daily. 180 tablet 3   nitroGLYCERIN  (NITROSTAT ) 0.4 MG SL tablet Take 1 tablet under the tongue as needed for chest pain every 5 mins up to three times; if no relief, seek medical attention 25 tablet 3    pantoprazole  (PROTONIX ) 40 MG tablet Take 1 tablet (40 mg total) by mouth 2 (two) times daily. 180 tablet 2   sacubitril -valsartan  (ENTRESTO ) 97-103 MG Take 1 tablet by mouth 2 (two) times daily. 60 tablet 11   Semaglutide , 1 MG/DOSE, (OZEMPIC , 1 MG/DOSE,) 4 MG/3ML SOPN Inject 1 mg into the skin once a week. 3 mL 5   sildenafil  (REVATIO ) 20 MG tablet Take 1-5 tablets (20-100 mg total) by mouth daily as needed. 100 tablet 1   spironolactone  (ALDACTONE ) 25 MG tablet Take 1/2 tablet (12.5  mg total) by mouth 2 (two) times daily. 30 tablet 9   tadalafil  (CIALIS ) 20 MG tablet Take 1 tablet (20 mg total) by mouth daily. 30 tablet 11   traZODone  (DESYREL ) 100 MG tablet Take 1 tablet (100 mg total) by mouth at bedtime. 90 tablet 3   zinc gluconate 50 MG tablet Take 50 mg by mouth daily.     albuterol  (VENTOLIN  HFA) 108 (90 Base) MCG/ACT inhaler Inhale 2 puffs into the lungs every 6 (six) hours as needed for wheezing or shortness of breath. (Patient not taking: Reported on 06/13/2024)     cetirizine  (ZYRTEC ) 10 MG tablet TAKE 1 TABLET(10 MG) BY MOUTH DAILY (Patient not taking: Reported on 06/13/2024) 30 tablet 5   diazepam  (VALIUM ) 5 MG tablet Take 1 tablet (5 mg total) by mouth once for 1 dose 30 minutes prior to procedure, may repeat if needed. (Patient not taking: Reported on 06/13/2024) 2 tablet 0   methocarbamol  (ROBAXIN ) 750 MG tablet Take 1 tablet (750 mg total) by mouth at bedtime as needed for muscle spasms (Patient not taking: Reported on 06/13/2024) 30 tablet 1   polyethylene glycol-electrolytes (NULYTELY) 420 g solution Use as directed (Patient not taking: Reported on 06/13/2024) 4000 mL 0   Semaglutide ,0.25 or 0.5MG /DOS, (OZEMPIC , 0.25 OR 0.5 MG/DOSE,) 2 MG/3ML SOPN Inject 0.25 mg into the skin once a week. (Patient not taking: Reported on 06/13/2024) 3 mL 5   Semaglutide , 1 MG/DOSE, (OZEMPIC , 1 MG/DOSE,) 4 MG/3ML SOPN Inject 1 mg into the skin once a week. (Patient not taking: Reported on 06/13/2024) 3 mL  5   sildenafil  (REVATIO ) 20 MG tablet Take 20 mg by mouth daily as needed for erectile dysfunction. (Patient not taking: Reported on 06/13/2024)     No current facility-administered medications on file prior to visit.    LABS/IMAGING: No results found for this or any previous visit (from the past 48 hours). No results found.  LIPID PANEL:    Component Value Date/Time   CHOL 179 01/28/2021 1000   TRIG 52 01/28/2021 1000   HDL 45 01/28/2021 1000   CHOLHDL 4.0 01/28/2021 1000   CHOLHDL 5.1 05/30/2020 0952   VLDL 15 05/30/2020 0952   LDLCALC 124 (H) 01/28/2021 1000    WEIGHTS: Wt Readings from Last 3 Encounters:  06/13/24 270 lb (122.5 kg)  02/14/24 285 lb 7.9 oz (129.5 kg)  09/12/23 288 lb (130.6 kg)    VITALS: BP 110/72   Pulse 72   Resp (!) 96   Ht 6' 1 (1.854 m)   Wt 270 lb (122.5 kg)   BMI 35.62 kg/m   EXAM: General appearance: alert and no distress Lungs: clear to auscultation bilaterally Heart: regular rate and rhythm, S1, S2 normal, no murmur, click, rub or gallop Extremities: extremities normal, atraumatic, no cyanosis or edema Neurologic: Grossly normal  EKG: EKG Interpretation Date/Time:  Friday June 13 2024 14:50:51 EDT Ventricular Rate:  72 PR Interval:  196 QRS Duration:  84 QT Interval:  376 QTC Calculation: 411 R Axis:   -28  Text Interpretation: Normal sinus rhythm Inferior infarct (cited on or before 10-Jun-2020) Anterior infarct (cited on or before 10-Jun-2020) When compared with ECG of 12-Sep-2023 15:49, No significant change was found Confirmed by Mona Kent [phone]) on 06/13/2024 3:12:03 PM    ASSESSMENT: Mixed dyslipidemia, goal LDL less than 70 Coronary artery disease status post PCI History of atrial flutter Morbid obesity Hypertension Family history of coronary disease OSA on CPAP  PLAN: 1.  [name] Merritt denies any chest pain or worsening shortness of breath.  He has not had any recurrent A-fib or a flutter.  He is  compliant with his anticoagulation and denies any bleeding issues.  He has had significant weight loss recently with Ozempic .  He has a history of sleep apnea and is compliant with CPAP.  Cholesterol is pretty well-controlled.  He is on Repatha .  He requested nitroglycerin  refill which we will provide today but he has not needed to use it.  He said he gets very infrequent chest pressure with heavy exercise but not with normal activity.  Plan follow-up with me annually or sooner as necessary.  [name] KYM Maxcy, MD, [facility], FNLA, FACP  [place]  [facility]  Medical Director of the Advanced Lipid Disorders &  Cardiovascular Risk Reduction Clinic Diplomate of the American Board of Clinical Lipidology Attending Cardiologist  Direct Dial: [phone]  Fax: [phone]  Website:  [email or web address] [name] Merritt 06/13/2024, 3:25 PM

## 2024-06-16 ENCOUNTER — Other Ambulatory Visit (HOSPITAL_COMMUNITY): Payer: Self-pay

## 2024-06-18 DIAGNOSIS — E113512 Type 2 diabetes mellitus with proliferative diabetic retinopathy with macular edema, left eye: Secondary | ICD-10-CM | POA: Diagnosis not present

## 2024-07-04 ENCOUNTER — Other Ambulatory Visit (HOSPITAL_COMMUNITY): Payer: Self-pay

## 2024-07-07 DIAGNOSIS — E11319 Type 2 diabetes mellitus with unspecified diabetic retinopathy without macular edema: Secondary | ICD-10-CM | POA: Diagnosis not present

## 2024-07-07 DIAGNOSIS — H53469 Homonymous bilateral field defects, unspecified side: Secondary | ICD-10-CM | POA: Diagnosis not present

## 2024-07-07 DIAGNOSIS — Z961 Presence of intraocular lens: Secondary | ICD-10-CM | POA: Diagnosis not present

## 2024-07-08 ENCOUNTER — Other Ambulatory Visit (HOSPITAL_COMMUNITY): Payer: Self-pay

## 2024-07-08 ENCOUNTER — Other Ambulatory Visit: Payer: Self-pay | Admitting: Internal Medicine

## 2024-07-08 DIAGNOSIS — I2102 ST elevation (STEMI) myocardial infarction involving left anterior descending coronary artery: Secondary | ICD-10-CM

## 2024-07-08 DIAGNOSIS — I2510 Atherosclerotic heart disease of native coronary artery without angina pectoris: Secondary | ICD-10-CM

## 2024-07-08 DIAGNOSIS — E785 Hyperlipidemia, unspecified: Secondary | ICD-10-CM

## 2024-07-08 MED ORDER — REPATHA SURECLICK 140 MG/ML ~~LOC~~ SOAJ
140.0000 mg | SUBCUTANEOUS | 3 refills | Status: AC
  Filled 2024-07-08: qty 6, 84d supply, fill #0
  Filled 2024-09-25: qty 6, 84d supply, fill #1

## 2024-07-14 ENCOUNTER — Other Ambulatory Visit (HOSPITAL_COMMUNITY): Payer: Self-pay

## 2024-07-28 ENCOUNTER — Other Ambulatory Visit (HOSPITAL_COMMUNITY): Payer: Self-pay

## 2024-07-28 DIAGNOSIS — E113511 Type 2 diabetes mellitus with proliferative diabetic retinopathy with macular edema, right eye: Secondary | ICD-10-CM | POA: Diagnosis not present

## 2024-07-28 MED ORDER — METFORMIN HCL 1000 MG PO TABS
ORAL_TABLET | ORAL | 3 refills | Status: AC
  Filled 2024-07-28 (×2): qty 135, 90d supply, fill #0
  Filled 2024-10-21: qty 135, 90d supply, fill #1

## 2024-07-30 ENCOUNTER — Other Ambulatory Visit (HOSPITAL_COMMUNITY): Payer: Self-pay

## 2024-08-01 ENCOUNTER — Other Ambulatory Visit (HOSPITAL_COMMUNITY): Payer: Self-pay

## 2024-08-01 MED ORDER — AMLODIPINE BESYLATE 5 MG PO TABS
5.0000 mg | ORAL_TABLET | Freq: Every day | ORAL | 3 refills | Status: AC
  Filled 2024-08-01: qty 90, 90d supply, fill #0
  Filled 2024-10-29: qty 90, 90d supply, fill #1
  Filled 2024-10-29: qty 90, 90d supply, fill #0

## 2024-08-05 ENCOUNTER — Other Ambulatory Visit (HOSPITAL_COMMUNITY): Payer: Self-pay

## 2024-08-05 ENCOUNTER — Other Ambulatory Visit: Payer: Self-pay

## 2024-08-05 MED ORDER — PANTOPRAZOLE SODIUM 40 MG PO TBEC
40.0000 mg | DELAYED_RELEASE_TABLET | Freq: Two times a day (BID) | ORAL | 2 refills | Status: AC
  Filled 2024-08-05: qty 180, 90d supply, fill #0
  Filled 2024-10-29: qty 180, 90d supply, fill #1
  Filled 2024-10-29: qty 180, 90d supply, fill #0

## 2024-08-06 ENCOUNTER — Other Ambulatory Visit: Payer: Self-pay | Admitting: Internal Medicine

## 2024-08-06 ENCOUNTER — Other Ambulatory Visit (HOSPITAL_COMMUNITY): Payer: Self-pay

## 2024-08-06 ENCOUNTER — Other Ambulatory Visit: Payer: Self-pay

## 2024-08-06 MED ORDER — CLOPIDOGREL BISULFATE 75 MG PO TABS
75.0000 mg | ORAL_TABLET | Freq: Every day | ORAL | 1 refills | Status: AC
  Filled 2024-08-06 – 2024-10-29 (×2): qty 90, 90d supply, fill #0
  Filled 2024-10-29: qty 90, 90d supply, fill #1

## 2024-08-13 ENCOUNTER — Other Ambulatory Visit: Payer: Self-pay

## 2024-08-13 ENCOUNTER — Other Ambulatory Visit (HOSPITAL_COMMUNITY): Payer: Self-pay

## 2024-08-13 DIAGNOSIS — E113512 Type 2 diabetes mellitus with proliferative diabetic retinopathy with macular edema, left eye: Secondary | ICD-10-CM | POA: Diagnosis not present

## 2024-08-13 MED ORDER — OFLOXACIN 0.3 % OP SOLN
1.0000 [drp] | Freq: Three times a day (TID) | OPHTHALMIC | 3 refills | Status: AC
  Filled 2024-08-13 – 2024-08-14 (×2): qty 5, 34d supply, fill #0
  Filled 2024-08-14: qty 5, 30d supply, fill #0
  Filled 2024-08-14: qty 5, 14d supply, fill #0
  Filled 2024-09-08: qty 5, 33d supply, fill #1
  Filled 2024-09-08 (×2): qty 5, 30d supply, fill #1
  Filled 2024-10-15: qty 5, 33d supply, fill #2

## 2024-08-14 ENCOUNTER — Other Ambulatory Visit: Payer: Self-pay

## 2024-08-14 ENCOUNTER — Other Ambulatory Visit (HOSPITAL_COMMUNITY): Payer: Self-pay

## 2024-08-28 ENCOUNTER — Other Ambulatory Visit (HOSPITAL_COMMUNITY): Payer: Self-pay

## 2024-09-01 ENCOUNTER — Other Ambulatory Visit (HOSPITAL_COMMUNITY): Payer: Self-pay

## 2024-09-02 ENCOUNTER — Other Ambulatory Visit: Payer: Self-pay

## 2024-09-02 ENCOUNTER — Other Ambulatory Visit (HOSPITAL_COMMUNITY): Payer: Self-pay

## 2024-09-04 ENCOUNTER — Other Ambulatory Visit: Payer: Self-pay

## 2024-09-08 ENCOUNTER — Other Ambulatory Visit: Payer: Self-pay

## 2024-09-08 ENCOUNTER — Other Ambulatory Visit (HOSPITAL_COMMUNITY): Payer: Self-pay

## 2024-09-10 DIAGNOSIS — E113593 Type 2 diabetes mellitus with proliferative diabetic retinopathy without macular edema, bilateral: Secondary | ICD-10-CM | POA: Diagnosis not present

## 2024-09-10 DIAGNOSIS — I10 Essential (primary) hypertension: Secondary | ICD-10-CM | POA: Diagnosis not present

## 2024-09-10 DIAGNOSIS — Z23 Encounter for immunization: Secondary | ICD-10-CM | POA: Diagnosis not present

## 2024-09-10 DIAGNOSIS — Z0000 Encounter for general adult medical examination without abnormal findings: Secondary | ICD-10-CM | POA: Diagnosis not present

## 2024-09-10 DIAGNOSIS — Z125 Encounter for screening for malignant neoplasm of prostate: Secondary | ICD-10-CM | POA: Diagnosis not present

## 2024-09-10 DIAGNOSIS — E7800 Pure hypercholesterolemia, unspecified: Secondary | ICD-10-CM | POA: Diagnosis not present

## 2024-09-10 DIAGNOSIS — I2510 Atherosclerotic heart disease of native coronary artery without angina pectoris: Secondary | ICD-10-CM | POA: Diagnosis not present

## 2024-09-22 DIAGNOSIS — E113511 Type 2 diabetes mellitus with proliferative diabetic retinopathy with macular edema, right eye: Secondary | ICD-10-CM | POA: Diagnosis not present

## 2024-09-25 ENCOUNTER — Other Ambulatory Visit (HOSPITAL_COMMUNITY): Payer: Self-pay

## 2024-10-01 ENCOUNTER — Other Ambulatory Visit (HOSPITAL_COMMUNITY): Payer: Self-pay

## 2024-10-01 ENCOUNTER — Other Ambulatory Visit: Payer: Self-pay

## 2024-10-03 ENCOUNTER — Other Ambulatory Visit: Payer: Self-pay

## 2024-10-03 ENCOUNTER — Other Ambulatory Visit (HOSPITAL_COMMUNITY): Payer: Self-pay

## 2024-10-03 DIAGNOSIS — M25511 Pain in right shoulder: Secondary | ICD-10-CM | POA: Diagnosis not present

## 2024-10-03 DIAGNOSIS — Z9889 Other specified postprocedural states: Secondary | ICD-10-CM | POA: Diagnosis not present

## 2024-10-07 ENCOUNTER — Other Ambulatory Visit: Payer: Self-pay

## 2024-10-07 ENCOUNTER — Other Ambulatory Visit (HOSPITAL_COMMUNITY): Payer: Self-pay

## 2024-10-07 ENCOUNTER — Other Ambulatory Visit: Payer: Self-pay | Admitting: Internal Medicine

## 2024-10-07 MED ORDER — SACUBITRIL-VALSARTAN 97-103 MG PO TABS
1.0000 | ORAL_TABLET | Freq: Two times a day (BID) | ORAL | 11 refills | Status: AC
  Filled 2024-10-07 – 2024-11-04 (×2): qty 60, 30d supply, fill #0

## 2024-10-09 NOTE — Telephone Encounter (Signed)
 PT is out of medication

## 2024-10-09 NOTE — Telephone Encounter (Signed)
 Please refill, wife is checking on it

## 2024-10-10 ENCOUNTER — Other Ambulatory Visit (HOSPITAL_COMMUNITY): Payer: Self-pay

## 2024-10-15 ENCOUNTER — Other Ambulatory Visit (HOSPITAL_COMMUNITY): Payer: Self-pay

## 2024-10-20 DIAGNOSIS — S46011D Strain of muscle(s) and tendon(s) of the rotator cuff of right shoulder, subsequent encounter: Secondary | ICD-10-CM | POA: Diagnosis not present

## 2024-10-20 DIAGNOSIS — M6281 Muscle weakness (generalized): Secondary | ICD-10-CM | POA: Diagnosis not present

## 2024-10-21 ENCOUNTER — Other Ambulatory Visit: Payer: Self-pay | Admitting: Internal Medicine

## 2024-10-21 ENCOUNTER — Other Ambulatory Visit: Payer: Self-pay

## 2024-10-21 ENCOUNTER — Other Ambulatory Visit (HOSPITAL_COMMUNITY): Payer: Self-pay

## 2024-10-21 MED ORDER — SPIRONOLACTONE 25 MG PO TABS
12.5000 mg | ORAL_TABLET | Freq: Two times a day (BID) | ORAL | 3 refills | Status: AC
  Filled 2024-10-21: qty 90, 90d supply, fill #0

## 2024-10-27 ENCOUNTER — Other Ambulatory Visit: Payer: Self-pay

## 2024-10-27 ENCOUNTER — Other Ambulatory Visit (HOSPITAL_COMMUNITY): Payer: Self-pay

## 2024-10-28 ENCOUNTER — Encounter (HOSPITAL_BASED_OUTPATIENT_CLINIC_OR_DEPARTMENT_OTHER): Payer: Self-pay | Admitting: Orthopaedic Surgery

## 2024-10-28 ENCOUNTER — Other Ambulatory Visit: Payer: Self-pay

## 2024-10-29 ENCOUNTER — Other Ambulatory Visit (HOSPITAL_COMMUNITY): Payer: Self-pay

## 2024-10-30 ENCOUNTER — Other Ambulatory Visit: Payer: Self-pay

## 2024-11-04 ENCOUNTER — Other Ambulatory Visit: Payer: Self-pay

## 2024-11-19 ENCOUNTER — Other Ambulatory Visit (HOSPITAL_COMMUNITY): Payer: Self-pay
